# Patient Record
Sex: Male | Born: 1940 | Race: White | Hispanic: No | Marital: Married | Smoking: Former smoker
Health system: Southern US, Community
[De-identification: ages and names within clinical notes are randomized; demographics above are authoritative.]

## PROBLEM LIST (undated history)

## (undated) DIAGNOSIS — M4800 Spinal stenosis, site unspecified: Secondary | ICD-10-CM

## (undated) DIAGNOSIS — M1990 Unspecified osteoarthritis, unspecified site: Secondary | ICD-10-CM

## (undated) DIAGNOSIS — D369 Benign neoplasm, unspecified site: Secondary | ICD-10-CM

## (undated) DIAGNOSIS — B379 Candidiasis, unspecified: Secondary | ICD-10-CM

## (undated) DIAGNOSIS — G062 Extradural and subdural abscess, unspecified: Secondary | ICD-10-CM

## (undated) DIAGNOSIS — M4646 Discitis, unspecified, lumbar region: Secondary | ICD-10-CM

## (undated) DIAGNOSIS — T7840XA Allergy, unspecified, initial encounter: Secondary | ICD-10-CM

## (undated) DIAGNOSIS — H269 Unspecified cataract: Secondary | ICD-10-CM

## (undated) DIAGNOSIS — E785 Hyperlipidemia, unspecified: Secondary | ICD-10-CM

## (undated) DIAGNOSIS — C61 Malignant neoplasm of prostate: Secondary | ICD-10-CM

## (undated) DIAGNOSIS — Z973 Presence of spectacles and contact lenses: Secondary | ICD-10-CM

## (undated) DIAGNOSIS — N400 Enlarged prostate without lower urinary tract symptoms: Secondary | ICD-10-CM

## (undated) HISTORY — PX: TONSILLECTOMY: SUR1361

## (undated) HISTORY — PX: UPPER GASTROINTESTINAL ENDOSCOPY: SHX188

## (undated) HISTORY — DX: Hyperlipidemia, unspecified: E78.5

## (undated) HISTORY — DX: Malignant neoplasm of prostate: C61

## (undated) HISTORY — DX: Allergy, unspecified, initial encounter: T78.40XA

## (undated) HISTORY — PX: PROSTATE SURGERY: SHX751

## (undated) HISTORY — PX: COLONOSCOPY: SHX174

## (undated) HISTORY — DX: Discitis, unspecified, lumbar region: M46.46

## (undated) HISTORY — DX: Benign prostatic hyperplasia without lower urinary tract symptoms: N40.0

## (undated) HISTORY — DX: Unspecified osteoarthritis, unspecified site: M19.90

## (undated) HISTORY — DX: Extradural and subdural abscess, unspecified: G06.2

## (undated) HISTORY — DX: Unspecified cataract: H26.9

## (undated) HISTORY — DX: Benign neoplasm, unspecified site: D36.9

## (undated) HISTORY — PX: JOINT REPLACEMENT: SHX530

## (undated) HISTORY — PX: POLYPECTOMY: SHX149

## (undated) HISTORY — DX: Candidiasis, unspecified: B37.9

## (undated) SURGERY — Surgical Case
Anesthesia: *Unknown

---

## 2002-11-04 HISTORY — PX: COLONOSCOPY W/ POLYPECTOMY: SHX1380

## 2002-11-04 HISTORY — PX: ESOPHAGOGASTRODUODENOSCOPY: SHX1529

## 2003-07-20 ENCOUNTER — Ambulatory Visit: Admission: RE | Admit: 2003-07-20 | Discharge: 2003-09-07 | Payer: Self-pay | Admitting: Radiation Oncology

## 2003-09-12 HISTORY — PX: RETROPUBIC PROSTATECTOMY: SUR1055

## 2003-09-22 ENCOUNTER — Inpatient Hospital Stay (HOSPITAL_COMMUNITY): Admission: RE | Admit: 2003-09-22 | Discharge: 2003-09-24 | Payer: Self-pay | Admitting: Urology

## 2003-09-22 ENCOUNTER — Encounter (INDEPENDENT_AMBULATORY_CARE_PROVIDER_SITE_OTHER): Payer: Self-pay | Admitting: Specialist

## 2007-02-27 ENCOUNTER — Encounter: Admission: RE | Admit: 2007-02-27 | Discharge: 2007-04-19 | Payer: Self-pay | Admitting: Orthopedic Surgery

## 2009-05-25 ENCOUNTER — Ambulatory Visit: Payer: Self-pay | Admitting: Internal Medicine

## 2009-06-08 ENCOUNTER — Ambulatory Visit: Payer: Self-pay | Admitting: Internal Medicine

## 2009-06-08 ENCOUNTER — Encounter: Payer: Self-pay | Admitting: Internal Medicine

## 2009-06-08 HISTORY — PX: COLONOSCOPY W/ POLYPECTOMY: SHX1380

## 2009-06-13 ENCOUNTER — Encounter: Payer: Self-pay | Admitting: Internal Medicine

## 2010-04-11 HISTORY — PX: HIP ARTHROPLASTY: SHX981

## 2010-04-11 HISTORY — PX: CYSTOSCOPY: SUR368

## 2010-04-25 ENCOUNTER — Inpatient Hospital Stay (HOSPITAL_COMMUNITY): Admission: RE | Admit: 2010-04-25 | Discharge: 2010-04-27 | Payer: Self-pay | Admitting: Orthopedic Surgery

## 2010-10-11 LAB — LIPID PANEL
Cholesterol: 115 mg/dL (ref 0–200)
HDL: 35 mg/dL (ref 35–70)

## 2010-11-07 ENCOUNTER — Encounter (INDEPENDENT_AMBULATORY_CARE_PROVIDER_SITE_OTHER): Payer: Self-pay | Admitting: *Deleted

## 2010-11-17 NOTE — Letter (Signed)
Summary: New Patient letter  [facility]  [address]   [place], [place] [address]   Phone: [phone]  Fax: [phone]       11/07/2010 MRN: [id number]  Donald [name] [address] [place], [place]  [address]  Dear Mr. TRULL,  Welcome to the Gastroenterology Division at [facility].    You are scheduled to see Dr.  Leone Payor on 12/15/2010 at 1:45 on the 3rd floor at [facility], [address]  We ask that you try to arrive at our office 15 minutes prior to your appointment time to allow for check-in.  We would like you to complete the enclosed self-administered evaluation form prior to your visit and bring it with you on the day of your appointment.  We will review it with you.  Also, please bring a complete list of all your medications or, if you prefer, bring the medication bottles and we will list them.  Please bring your insurance card so that we may make a copy of it.  If your insurance requires a referral to see a specialist, please bring your referral form from your primary care physician.  Co-payments are due at the time of your visit and may be paid by cash, check or credit card.     Your office visit will consist of a consult with your physician (includes a physical exam), any laboratory testing he/she may order, scheduling of any necessary diagnostic testing (e.g. x-ray, ultrasound, CT-scan), and scheduling of a procedure (e.g. Endoscopy, Colonoscopy) if required.  Please allow enough time on your schedule to allow for any/all of these possibilities.    If you cannot keep your appointment, please call [phone] to cancel or reschedule prior to your appointment date.  This allows [place] the opportunity to schedule an appointment for another patient in need of care.  If you do not cancel or reschedule by 5 p.m. the business day prior to your appointment date, you will be charged a $50.00 late cancellation/no-show fee.    Thank you for choosing  [facility] for your medical needs.  We appreciate the opportunity to care for you.  Please visit [place] at our website  to learn more about our practice.                     Sincerely,                                                             The Gastroenterology Division

## 2010-11-25 LAB — BASIC METABOLIC PANEL
BUN: 12 mg/dL (ref 6–23)
BUN: 18 mg/dL (ref 6–23)
CO2: 29 mEq/L (ref 19–32)
CO2: 24 mEq/L (ref 19–32)
Calcium: 8.4 mg/dL (ref 8.4–10.5)
Calcium: 8.9 mg/dL (ref 8.4–10.5)
Chloride: 106 mEq/L (ref 96–112)
Chloride: 109 mEq/L (ref 96–112)
Creatinine, Ser: 1.11 mg/dL (ref 0.4–1.5)
Creatinine, Ser: 1.06 mg/dL (ref 0.4–1.5)
GFR calc Af Amer: >60 mL/min (ref >60)
GFR calc Af Amer: >60 mL/min (ref >60)
GFR calc non Af Amer: >60 mL/min (ref >60)
GFR calc non Af Amer: >60 mL/min (ref >60)
Glucose, Bld: 138 mg/dL — ABNORMAL HIGH (ref 70–99)
Glucose, Bld: 97 mg/dL (ref 70–99)
Potassium: 4.2 mEq/L (ref 3.5–5.1)
Potassium: 4.2 mEq/L (ref 3.5–5.1)
Sodium: 142 mEq/L (ref 135–145)
Sodium: 142 mEq/L (ref 135–145)

## 2010-11-25 LAB — URINALYSIS, ROUTINE W REFLEX MICROSCOPIC
Bilirubin Urine: NEGATIVE
Glucose, UA: NEGATIVE mg/dL
Hgb urine dipstick: NEGATIVE
Ketones, ur: NEGATIVE mg/dL
Nitrite: NEGATIVE
Protein, ur: NEGATIVE mg/dL
Specific Gravity, Urine: 1.014 (ref 1.005–1.030)
Urobilinogen, UA: 0.2 mg/dL (ref 0.0–1.0)
pH: 6 (ref 5.0–8.0)

## 2010-11-25 LAB — DIFFERENTIAL
Basophils Absolute: 0 10*3/uL (ref 0.0–0.1)
Basophils Relative: 0 % (ref 0–1)
Eosinophils Absolute: 0.3 10*3/uL (ref 0.0–0.7)
Eosinophils Relative: 5 % (ref 0–5)
Lymphocytes Relative: 28 % (ref 12–46)
Lymphs Abs: 2.1 10*3/uL (ref 0.7–4.0)
Monocytes Absolute: 0.5 10*3/uL (ref 0.1–1.0)
Monocytes Relative: 6 % (ref 3–12)
Neutro Abs: 4.6 10*3/uL (ref 1.7–7.7)
Neutrophils Relative %: 61 % (ref 43–77)

## 2010-11-25 LAB — CBC
HCT: 35.7 % — ABNORMAL LOW (ref 39.0–52.0)
HCT: 36.6 % — ABNORMAL LOW (ref 39.0–52.0)
HCT: 44 % (ref 39.0–52.0)
Hemoglobin: 11.8 g/dL — ABNORMAL LOW (ref 13.0–17.0)
Hemoglobin: 12.5 g/dL — ABNORMAL LOW (ref 13.0–17.0)
Hemoglobin: 14.8 g/dL (ref 13.0–17.0)
MCH: 30.1 pg (ref 26.0–34.0)
MCH: 31.1 pg (ref 26.0–34.0)
MCH: 30.4 pg (ref 26.0–34.0)
MCHC: 33.1 g/dL (ref 30.0–36.0)
MCHC: 34.2 g/dL (ref 30.0–36.0)
MCHC: 33.6 g/dL (ref 30.0–36.0)
MCV: 91 fL (ref 78.0–100.0)
MCV: 91.1 fL (ref 78.0–100.0)
MCV: 90.3 fL (ref 78.0–100.0)
Platelets: 155 10*3/uL (ref 150–400)
Platelets: 161 10*3/uL (ref 150–400)
Platelets: 179 10*3/uL (ref 150–400)
RBC: 3.92 MIL/uL — ABNORMAL LOW (ref 4.22–5.81)
RBC: 4.02 MIL/uL — ABNORMAL LOW (ref 4.22–5.81)
RBC: 4.87 MIL/uL (ref 4.22–5.81)
RDW: 13 % (ref 11.5–15.5)
RDW: 13.1 % (ref 11.5–15.5)
RDW: 12.9 % (ref 11.5–15.5)
WBC: 7.6 10*3/uL (ref 4.0–10.5)
WBC: 9.1 10*3/uL (ref 4.0–10.5)
WBC: 7.5 10*3/uL (ref 4.0–10.5)

## 2010-11-25 LAB — PROTIME-INR
INR: 1.06 (ref 0.00–1.49)
INR: 1.25 (ref 0.00–1.49)
INR: 0.99 (ref 0.00–1.49)
Prothrombin Time: 14 seconds (ref 11.6–15.2)
Prothrombin Time: 15.9 seconds — ABNORMAL HIGH (ref 11.6–15.2)
Prothrombin Time: 13.3 seconds (ref 11.6–15.2)

## 2010-11-25 LAB — PSA: PSA: <0.01 ng/mL — ABNORMAL LOW (ref 0.10–4.00)

## 2010-11-25 LAB — ABO/RH: ABO/RH(D): A NEG

## 2010-11-25 LAB — SURGICAL PCR SCREEN
MRSA, PCR: NEGATIVE
Staphylococcus aureus: NEGATIVE

## 2010-11-25 LAB — TYPE AND SCREEN
ABO/RH(D): A NEG
Antibody Screen: NEGATIVE

## 2010-11-25 LAB — APTT: aPTT: 35 seconds (ref 24–37)

## 2010-12-15 ENCOUNTER — Encounter: Payer: Self-pay | Admitting: Internal Medicine

## 2010-12-15 ENCOUNTER — Ambulatory Visit (INDEPENDENT_AMBULATORY_CARE_PROVIDER_SITE_OTHER): Payer: Medicare Other | Admitting: Internal Medicine

## 2010-12-15 DIAGNOSIS — Z791 Long term (current) use of non-steroidal anti-inflammatories (NSAID): Secondary | ICD-10-CM

## 2010-12-15 DIAGNOSIS — R195 Other fecal abnormalities: Secondary | ICD-10-CM

## 2010-12-15 DIAGNOSIS — Z86010 Personal history of colonic polyps: Secondary | ICD-10-CM

## 2010-12-15 MED ORDER — OMEPRAZOLE 20 MG PO CPDR: 20.0000 mg | DELAYED_RELEASE_CAPSULE | Freq: Every day | ORAL | Status: AC

## 2010-12-15 MED ORDER — OMEPRAZOLE 20 MG PO CPDR: 20.0000 mg | DELAYED_RELEASE_CAPSULE | Freq: Every day | ORAL | Status: DC

## 2010-12-15 NOTE — Progress Notes (Signed)
70 year old white man known from prior endoscopic evaluations as outlined above. He was undergoing a digital rectal exam by primary care physician recently and was found to be Hemoccult positive. He denies any rectal bleeding, melena change in bowels or other GI problems. He had a colonoscopy in September 2010 with multiple polyps from moved, 3 of which were adenomatous. He had a previous colonoscopy in 2004. He has a CBC on 10/11/2010 with hemoglobin 14 MCV 89 white count 6.4 platelets 171. Competent metabolic panel as well.  He was recently started on Mobic because of right hip pain. He is going to be another hip arthroplasty but has a summer riverboat cruise planned, that'll be in [place]. He wanted to wait for surgery. The Mobic is helping his hip pain. He is not on PPI therapy   Assessment and plan  He has a heme positive stool on digital rectal exam. Given his previous colonoscopies and lack of other problems we have decided not to work this up further. Since he is in a routine colonoscopy followup and screening program he does not really need routine Hemoccults.  Chronic Mobic therapy, given his age and the chronic NSAID treatment plan for the next several months at least, I have recommended he take omeprazole or other PPI 20 mg daily. He will be at higher risk of peptic ulcer disease and bleeding. If he chooses to take this he may want to have it while he is on the riverboat cruise as there is an increased risk of food born diarrheal illnesses while on a PPI. I've explained the rationale to prevent ulcers and problems on chronic PPI in elderly patients. He was given a prescription and will decide what to do.

## 2010-12-15 NOTE — Patient Instructions (Signed)
Start omeprazole prescription while on Mobic or other anti-inflammatory medicines. We will see you in 2013 for a Colonoscopy recall.

## 2010-12-16 ENCOUNTER — Encounter: Payer: Self-pay | Admitting: Internal Medicine

## 2010-12-16 DIAGNOSIS — Z86010 Personal history of colonic polyps: Secondary | ICD-10-CM | POA: Insufficient documentation

## 2011-01-09 ENCOUNTER — Ambulatory Visit (HOSPITAL_COMMUNITY)
Admission: RE | Admit: 2011-01-09 | Discharge: 2011-01-09 | Disposition: A | Discharge status: Home / Self Care | Payer: Medicare Other | Source: Ambulatory Visit | Attending: Orthopedic Surgery | Admitting: Orthopedic Surgery

## 2011-01-09 ENCOUNTER — Other Ambulatory Visit (HOSPITAL_COMMUNITY): Payer: Self-pay | Admitting: Orthopedic Surgery

## 2011-01-09 ENCOUNTER — Encounter (HOSPITAL_COMMUNITY)
Admission: RE | Admit: 2011-01-09 | Discharge: 2011-01-09 | Disposition: A | Discharge status: Home / Self Care | Payer: Medicare Other | Source: Ambulatory Visit | Attending: Orthopedic Surgery | Admitting: Orthopedic Surgery

## 2011-01-09 DIAGNOSIS — Z01811 Encounter for preprocedural respiratory examination: Secondary | ICD-10-CM

## 2011-01-09 DIAGNOSIS — Z01818 Encounter for other preprocedural examination: Secondary | ICD-10-CM | POA: Insufficient documentation

## 2011-01-09 DIAGNOSIS — Z01812 Encounter for preprocedural laboratory examination: Secondary | ICD-10-CM | POA: Insufficient documentation

## 2011-01-09 LAB — BASIC METABOLIC PANEL
Calcium: 9.2 mg/dL (ref 8.4–10.5)
GFR calc non Af Amer: >60 mL/min (ref >60)
Glucose, Bld: 88 mg/dL (ref 70–99)
Sodium: 141 mEq/L (ref 135–145)

## 2011-01-09 LAB — URINALYSIS, ROUTINE W REFLEX MICROSCOPIC
Nitrite: NEGATIVE
Specific Gravity, Urine: 1.021 (ref 1.005–1.030)
Urobilinogen, UA: 0.2 mg/dL (ref 0.0–1.0)
pH: 6 (ref 5.0–8.0)

## 2011-01-09 LAB — DIFFERENTIAL
Basophils Absolute: 0 10*3/uL (ref 0.0–0.1)
Basophils Relative: 0 % (ref 0–1)
Lymphocytes Relative: 26 % (ref 12–46)
Neutro Abs: 4.3 10*3/uL (ref 1.7–7.7)
Neutrophils Relative %: 63 % (ref 43–77)

## 2011-01-09 LAB — SURGICAL PCR SCREEN: Staphylococcus aureus: NEGATIVE

## 2011-01-09 LAB — CBC
HCT: 43.7 % (ref 39.0–52.0)
Hemoglobin: 14.7 g/dL (ref 13.0–17.0)
MCHC: 33.6 g/dL (ref 30.0–36.0)
RBC: 4.85 MIL/uL (ref 4.22–5.81)
WBC: 6.8 10*3/uL (ref 4.0–10.5)

## 2011-01-09 LAB — PROTIME-INR
INR: 1.02 (ref 0.00–1.49)
Prothrombin Time: 13.6 seconds (ref 11.6–15.2)

## 2011-01-09 LAB — APTT: aPTT: 30 seconds (ref 24–37)

## 2011-01-16 ENCOUNTER — Inpatient Hospital Stay (HOSPITAL_COMMUNITY): Payer: Medicare Other

## 2011-01-16 ENCOUNTER — Inpatient Hospital Stay (HOSPITAL_COMMUNITY)
Admission: RE | Admit: 2011-01-16 | Discharge: 2011-01-17 | DRG: 470 | Disposition: A | Discharge status: Home Health | Payer: Medicare Other | Source: Ambulatory Visit | Attending: Orthopedic Surgery | Admitting: Orthopedic Surgery

## 2011-01-16 DIAGNOSIS — M169 Osteoarthritis of hip, unspecified: Principal | ICD-10-CM | POA: Diagnosis present

## 2011-01-16 DIAGNOSIS — M1610 Unilateral primary osteoarthritis, unspecified hip: Principal | ICD-10-CM | POA: Diagnosis present

## 2011-01-16 LAB — TYPE AND SCREEN: ABO/RH(D): A NEG

## 2011-01-16 NOTE — Op Note (Addendum)
NAME:  Donald [name], Donald [name]             ACCOUNT NO.:  [id number]  MEDICAL RECORD NO.:  [id number]           PATIENT TYPE:  O  LOCATION:  XRAY                         FACILITY:  [facility]  PHYSICIAN:  Feliberto Gottron. Turner Daniels, M.D.   DATE OF BIRTH:  1941/02/06  DATE OF PROCEDURE:  01/16/2011 DATE OF DISCHARGE:  01/09/2011                              OPERATIVE REPORT   PREOPERATIVE DIAGNOSIS:  End-stage arthritis, right hip.  POSTOPERATIVE DIAGNOSIS:  End-stage arthritis, right hip.  PROCEDURE:  Right total hip arthroplasty using DePuy, 58-mm pinnacle cup, central occluder, 40-mm metal liner, 18 x 13 x 42 x 160 S-ROM stem, 69F large cone, +0 40-mm metal ball.  SURGEON:  Feliberto Gottron. Turner Daniels, MD  FIRST ASSISTANT:  Shirl Harris, PA-C  ANESTHETIC:  Endotracheal.  ESTIMATED BLOOD LOSS:  400 mL.  FLUID REPLACEMENT:  1500 mL crystalloid.  DRAINS PLACED:  None.  TOURNIQUET TIME:  None.  INDICATIONS FOR PROCEDURE:  A 70 year old gentleman who underwent primary left total hip by me last year and has done very well, desires elective right total hip arthroplasty for bone-on-bone arthritic changes.  Risks and benefits of surgery are known and he is well prepared for surgical intervention.  He has severe disabling pain similar to what he had on the left side prior to that replacement.  DESCRIPTION OF PROCEDURE:  The patient identified by armband and in the holding area received preoperative IV antibiotics, was then taken to operating room #5.  Appropriate site monitors attached and endotracheal anesthesia induced with the patient in supine position, rolled into the left lateral decubitus position, fixed there with a Stulberg Mark II pelvic clamp.  Right lower extremity prepped and draped in usual sterile fashion from the ankle to the hemipelvis.  Time-out procedure performed. We then infiltrated the skin along the lateral hip and thigh with 20 mL of 0.5% Marcaine and epinephrine solution and 15 cm  incision was made centered over the greater trochanter allowing a posterolateral approach to the hip.  Small bleeders in skin and subcutaneous tissue identified and cauterized.  The IT band cut line with skin incision exposing the greater trochanter.  Cobra retractors were placed between the gluteus minimus and superior hip joint capsule and the quadratus femoris in the inferior hip joint capsule isolating the short external rotators and piriformis which were then cut off their insertion on the intertrochanteric crest and tagged with a #2 Ethibond suture.  The capsule was then developed into an acetabular based flap going from posterior-superior on the acetabulum out over the femoral neck and exiting posterior inferior back to the acetabulum.  This flap was likewise tagged to #2 Ethibond sutures.  At this point the hip was flexed and internally rotated dislocating the femoral head.  Standard neck cut performed one fingerbreadth above the lesser trochanter and the femoral head was extracted without difficulty.  At this point, we went ahead and translated with proximal femur anteriorly levering off the anterior column with a Hohmann retractor.  A spike Cobra was placed in the cotyloid notch and a posterior inferior wing retractor.  This gave [place] great exposure to the labrum which was  then removed without difficulty.  We then sequentially reamed up to a 57-mm basket reamer obtaining good coverage in all quadrants just getting down to the bleeding bone and touched the rim with a 58-mm reamer.  A 58-mm pinnacle cup with central hole only was then hammered into place and 40 degrees of abduction, 20 degrees of anteversion.  Central occluder placed and 40 mm metal liner hammered into the shell.  The hip was flexed and internally rotated exposing the proximal femur which was entered with the box cutting chisel, the initiating reamer, and axial reamers 8, 10, 11, 12, and half millimeter increments,  we went up to the 13.5-mm reamer and actually obtained good chatter at 12-mm but he had incredibly thick bone.  We reamed partway down the 14-mm reamer followed by conical reaming up to a 14F cone to the appropriate depth for a 42 base neck. We then milled the calcar to 14F large calcar and placed a trial Cone. This was followed by a trial stem in the same version as the cone about 10-15 degrees with a +0 40-mm ball.  The hip was reduced.  Excellent stability was noted to full extension and 40 of external rotation with no anterior instability and flexion of 90 with internal rotation of almost 80 degrees again with no instability noted.  At this point the trial component was removed.  The wound was thoroughly irrigated out with normal saline solution.  An 14F large cone was then hammered into place followed by a 18 x 13 x 160 x 42 S-ROM stem in about 5 degrees of anteversion in relation to the cone.  A +0 40-mm ball was hammered on the stem.  The hip was reduced.  Stability checked and found to be excellent.  We once again irrigated out with normal saline solution. The capsular flap and short external rotators were repaired back to the intertrochanteric crest through drill holes.  The IT band was closed with running #1 Vicryl suture.  The subcutaneous tissue with 0-0 and 2-0 undyed Vicryl suture and the skin with running interlocking 3-0 nylon suture.  Dressing of Xeroform and Mepilex was then applied.  The patient was unclamped, rolled supine, awakened, and extubated, and taken to the recovery room without difficulty.     Feliberto Gottron. Turner Daniels, M.D.     Ovid Curd  D:  01/16/2011  T:  01/16/2011  Job:  [id number]  Electronically Signed by Gean Birchwood M.D. on 02/02/2011 08:23:31 AM

## 2011-01-17 LAB — CBC
Hemoglobin: 10.5 g/dL — ABNORMAL LOW (ref 13.0–17.0)
MCH: 29.8 pg (ref 26.0–34.0)
MCHC: 33 g/dL (ref 30.0–36.0)
MCV: 90.3 fL (ref 78.0–100.0)
Platelets: 142 10*3/uL — ABNORMAL LOW (ref 150–400)
RBC: 3.52 MIL/uL — ABNORMAL LOW (ref 4.22–5.81)

## 2011-01-17 LAB — BASIC METABOLIC PANEL
BUN: 11 mg/dL (ref 6–23)
CO2: 30 mEq/L (ref 19–32)
Glucose, Bld: 111 mg/dL — ABNORMAL HIGH (ref 70–99)
Potassium: 3.6 mEq/L (ref 3.5–5.1)
Sodium: 139 mEq/L (ref 135–145)

## 2011-01-27 NOTE — Discharge Summary (Signed)
NAME:  Donald [name], Donald [name]                       ACCOUNT NO.:  [id number]   MEDICAL RECORD NO.:  [id number]                   PATIENT TYPE:  INP   LOCATION:  0383                                 FACILITY:  [facility]   PHYSICIAN:  Sigmund I. Patsi Sears, M.D.         DATE OF BIRTH:  30-Mar-1941   DATE OF ADMISSION:  09/22/2003  DATE OF DISCHARGE:  09/24/2003                                 DISCHARGE SUMMARY   DISCHARGE DIAGNOSES:  1. Prostate cancer.  2. Benign prostatic hypertrophy.   HISTORY OF PRESENT ILLNESS:  This is a 70 year old white male who was last  seen in 1992 with a history of BPH.  Since that time, his father has died  secondary to adenocarcinoma of the prostate almost one year ago.  The  patient's PSA had risen at his primary care office and he was referred back  to our office for further evaluation.  Prostate biopsies were obtained and  the patient was found to have a Gleason 3+4 (7) adenocarcinoma in the right  lateral biopsies and right central biopsies.  Dr. Patsi Sears discussed  treatment plans with the patient and he opted to have radical prostatectomy.  He was brought to the [facility] and procedure was done without  difficulty.   PAST MEDICAL HISTORY:  BPH.   HOSPITAL COURSE:  The patient was taken to the OR where radical  prostatectomy was performed (see operative note).  On postoperative day #1,  the patient was awake and alert, complaining of very little pain, and had no  problems with nausea or vomiting.  The Foley catheter was draining  adequately.  The patient ambulated several times during the evening on  postoperative day #1 and was very anxious to go home.  On postoperative day  #2, Dr. Patsi Sears reviewed postoperative instructions with the patient and  his wife and the patient felt that he was ready to go home.  Dr. Patsi Sears  assessed the patient and he was doing very well.  Dressings were changed and  instructions were given to the patient.  He was  given instructions on bowel  care at home.  He had not had a bowel movement before leaving the hospital,  but was passing significant flatus.   DISCHARGE MEDICATIONS:  1. Percocet.  2. Cipro.  3. He was to resume home medications.  4. He was to get Dulcolax to have at home to keep his bowels moving.   FOLLOWUP:  The patient is to follow up in the office with [facility]  next week to have staples removed and then will follow up in two weeks to  have Foley catheter removed.   CONDITION ON DISCHARGE:  Stable.     Terri Piedra, N.P.                         Sigmund I. Patsi Sears, M.D.    HB/MEDQ  D:  09/25/2003  T:  09/25/2003  Job:  [id number]

## 2011-01-27 NOTE — Op Note (Signed)
NAME:  Donald [name], Donald [name]                       ACCOUNT NO.:  [id number]   MEDICAL RECORD NO.:  [id number]                   PATIENT TYPE:  INP   LOCATION:  0007                                 FACILITY:  [facility]   PHYSICIAN:  Sigmund I. Patsi Sears, M.D.         DATE OF BIRTH:  October 30, 1940   DATE OF PROCEDURE:  09/22/2003  DATE OF DISCHARGE:                                 OPERATIVE REPORT   PREOPERATIVE DIAGNOSIS:  Adenocarcinoma of the prostate.   POSTOPERATIVE DIAGNOSIS:  Adenocarcinoma of the prostate.   OPERATION/PROCEDURE:  Radical retropubic prostatectomy.   SURGEON:  Sigmund I. Patsi Sears, M.D.   ASSISTANT:  Lucrezia Starch. Earlene Plater, M.D.   ANESTHESIA:  General endotracheal anesthesia.   ESTIMATED BLOOD LOSS:  500 mL.   PREPARATION:  After appropriate preanesthesia, the patient was brought to  the operating room and placed on the operating table in the dorsal supine  position where general endotracheal anesthesia was introduced.  He was then  replaced in the dorsal lithotomy position where the pubis was prepped with  Betadine solution and draped in the usual fashion.   REVIEW OF HISTORY:  This 70 year old male has a history of Gleason's 3 + 4  adenocarcinoma of the prostate in the right lateral biopsies, with a symptom  score sheet of 15/7, decreased to 10/7 while on doxazosin 4 mg a day  therapy.  CT scan and bone scan were negative.  He had a second opinion with  radiation therapy (Dr. Dan Humphreys) who advised radical prostatectomy.  Note  that the patient has a family history of prostate cancer as well.   DESCRIPTION OF PROCEDURE:  With the patient in the supine position, a roll  was placed under his hips, the table was placed in the flexed position.  Following this, a 16 cm midline suprapubic incision was made with  subcutaneous tissue dissected with the electrosurgical unit.  Retractor was  placed and the pelvic gutter was dissected bilaterally.  The obturator lymph  nodes  were dissected from the pelvic sidewall and from the external iliac  vein, distal-ward to the femoral canal, and inferiorly to the identified  obturator nerve.  The node package was removed bilaterally and sent  separately for evaluation.   The retropubic fascia was then dissected until the puboprostatic ligaments  were identified, and then the ligaments were incised.  A backbleeding suture  was placed in the bladder neck, and using a McDougal clamp, the tissue  superior to the urethra, the dorsal vein complex was doubly ligated and  incised.  Following this, dissection was accomplished around the urethra  with care taken to avoid injury to the neurovascular tissue of the urethra.  The urethra was incised dorsally and the catheter pulled through and used as  a traction device.  Under the posterior urethra, a drain was placed and the  posterior urethra was then incised.  Denonvilliers fascia was dissected and  the vascular supply of the prostate  was dissected laterally bilaterally.  The rectourethralis muscle was dissected inferiorly.  The ampulla of vas was  then identified and clipped and cut.   The bladder neck was then dissected and the patient was noted to have a  moderate sized median lobe and incision was made behind the median lobe of  the bladder neck.  Indigo carmine was given and ureteral orifices were  identified and blue dye was noted bilaterally.  Following this, the bladder  neck was everted with interrupted 4-0 Vicryl sutures.  3-0 Vicryl sutures  were used to tighten and elongate the bladder neck.  Four separate 2-0  Monocryl sutures were then placed at the 7 o'clock and 5 o'clock positions,  at the 10 o'clock and 2 o'clock positions and at the 12 o'clock position.  The bladder was irrigated.  The wound was irrigated and a size 20 Silastic  Foley with 22 cc in the balloon was placed in the bladder transurethrally.  With all sutures in place, traction was placed on the  balloon and the  sutures were then ligated.  Irrigation revealed a watertight anastomosis and  a 10 Blake drain was placed in the left pelvis and sutured in place with 3-0  nylon suture.   The wound was then closed by reapproximating the rectus muscle with loosely  ligated 3-0 chromic suture and the fascia was closed with #1 PDS running  suture.  Skin was closed with skin staples.  Plain Marcaine 0.25% was  injected in the wound and the patient was given IV Toradol.  He was awakened  and taken to the recovery room in good condition.                                               Sigmund I. Patsi Sears, M.D.    SIT/MEDQ  D:  09/22/2003  T:  09/22/2003  Job:  [id number]   cc:   Windy Fast L. Ovidio Hanger, M.D.  [address]  [place]  [place] [address]  Fax: [phone]

## 2011-01-27 NOTE — H&P (Signed)
NAME:  Donald [name], Donald [name]                       ACCOUNT NO.:  [id number]   MEDICAL RECORD NO.:  [id number]                   PATIENT TYPE:  INP   LOCATION:  0383                                 FACILITY:  [facility]   PHYSICIAN:  Sigmund I. Patsi Sears, M.D.         DATE OF BIRTH:  03/13/1941   DATE OF ADMISSION:  09/22/2003  DATE OF DISCHARGE:  09/24/2003                                HISTORY & PHYSICAL   HISTORY OF PRESENT ILLNESS:  This is a 70 year old white male who was last  seen in 1992, with a history of BPH.  Since that time, the patient's father  has died secondary to adenocarcinoma of the prostate, almost one year ago.  The patient has had PSA's with his primary care physician over the last  several years.  In 2001, his PSA was 3.9, 4.8 in January of 2004 and 5.9 in  September of 2004.  He was then referred back to our office given his  elevated PSA and positive family history of adenocarcinoma of the prostate.  Dr. Patsi Sears feels strongly that the patient needs a prostate ultrasound  and biopsy. Biopsy was performed and revealed a Gleason 3+4 (7)  adenocarcinoma in the right lateral biopsies and the right central biopsies.  Options were discussed with the patient and wife and it was decided that  patient would have a radical prostatectomy.   PAST MEDICAL HISTORY:  Benign prostatic hypertrophy.   PAST SURGICAL HISTORY:  None reported.   SOCIAL HISTORY:  The patient is married, states he quit smoking in 1970.  He  states he drinks approximately 1 pint of alcohol per week.   MEDICATIONS:  1. Doxazosin 4 g.  2. Flomax 0.4 mg q.d.   ALLERGIES:  IV CONTRAST and RED DYES.   REVIEW OF SYMPTOMS:  Noncontributory other than previously stated in the  HPI.   PHYSICAL EXAMINATION:  GENERAL:  A 70 year old, well-developed, well-  nourished, white male in no acute distress.  HEENT:  Normocephalic, no lymphadenopathy noted.  CARDIAC:  Regular rate and rhythm, no murmurs, rubs or  gallops noted.  RESPIRATORY:  Clear to auscultation bilaterally.  GI:  Rotund, soft, positive bowel sounds.  GU:  3-4+ lobular gland.  There is a firmness on the left side which is  different from the right.  NEUROLOGIC:  Grossly intact.  EXTREMITIES:  2+ pulses bilaterally, no edema noted.   LABORATORIES/X-RAYS/IMAGING:  AUA symptom score sheet is 15/7.  Improved to  10/7 with increased medication.  CT and bone scan are negative for  metastatic disease.  A prostate ultrasound shows a 57 mL gland.   ASSESSMENT:  Prostate cancer.   PLAN:  We will take the patient to [facility] for radical  prostatectomy with Sigmund I. Patsi Sears, M.D.     [place], [place].P.  Sigmund I. Patsi Sears, M.D.    HB/MEDQ  D:  09/24/2003  T:  09/24/2003  Job:  [id number]

## 2011-02-01 NOTE — Discharge Summary (Addendum)
  NAME:  Donald [name], BALABAN NO.:  [id number]  MEDICAL RECORD NO.:  [id number]           PATIENT TYPE:  LOCATION:                                 FACILITY:  PHYSICIAN:  Feliberto Gottron. Turner Daniels, M.D.   DATE OF BIRTH:  12-26-1940  DATE OF ADMISSION:  01/16/2011 DATE OF DISCHARGE:  01/17/2011                              DISCHARGE SUMMARY   CHIEF COMPLAINT:  Right hip pain.  HISTORY OF PRESENT ILLNESS:  This is a 70 year old gentleman who complains of severe unremitting pain in his right hip despite extensive conservative treatment.  He has a history of successful hip replacement on the contralateral side and now desires a surgical intervention on the right.  All risks and benefits of surgery were discussed with the patient.  PAST MEDICAL HISTORY:  Significant for prostate cancer and high cholesterol.  PAST SURGICAL HISTORY:  Significant for left total hip arthroplasty and radical prostatectomy.  SOCIAL HISTORY:  Denies use of tobacco and drinks occasional alcohol.  FAMILY HISTORY:  Positive for diabetes and coronary artery disease.  ALLERGIES:  He is allergic to RED TRACE DYE.  OBJECTIVE:  Gross examination of the right hip demonstrates significant pain with internal rotation.  Foot tap is negative.  He is neurovascularly intact.  X-rays of the right hip demonstrate moderate to severe arthritis.  PREOP LABS:  White blood cells 6.4, red blood cells 3.52, hemoglobin 10.5, hematocrit 31.8, platelets 142,000.  PT 15.8, INR 1.24.  Sodium 139, potassium 3.6, chloride 105, glucose 111, BUN 11, creatinine 1.0. Urinalysis was within normal limits.  HOSPITAL COURSE:  [name] was admitted to [facility] on Jan 16, 2011 when he underwent right total hip arthroplasty.  The procedure was performed by Dr. Gean Birchwood and the patient tolerated it well. Perioperative Foley catheter was placed and he was transferred to the floor on Lovenox and Coumadin for DVT prophylaxis.  On  the first postoperative day, he was awake and alert and reporting minimal pain in his hip.  He passed physical therapy.  His Foley catheter was discontinued and he was discharged home.  DISPOSITION:  The patient was discharged home on Jan 17, 2011.  He was weightbearing as tolerated and he will return to the clinic in 2 weeks for x-rays and suture removal.  He would be on Coumadin for 2 weeks with a target INR of 1.5-2.0.  Home health care would manage his wound, Coumadin, and physical therapy.  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     Shirl Harris, PA   ______________________________ Feliberto Gottron. Turner Daniels, M.D.    JW/MEDQ  D:  01/25/2011  T:  01/25/2011  Job:  [id number]  Electronically Signed by Shirl Harris PA on 02/07/2011 08:36:29 AM Electronically Signed by Gean Birchwood M.D. on 02/12/2011 04:11:33 PM

## 2011-08-17 ENCOUNTER — Ambulatory Visit (INDEPENDENT_AMBULATORY_CARE_PROVIDER_SITE_OTHER): Payer: Medicare Other

## 2011-08-17 DIAGNOSIS — H60339 Swimmer's ear, unspecified ear: Secondary | ICD-10-CM

## 2011-10-19 ENCOUNTER — Encounter: Payer: Self-pay | Admitting: Family Medicine

## 2011-10-19 LAB — IFOBT (OCCULT BLOOD): IFOBT: POSITIVE

## 2011-10-20 ENCOUNTER — Ambulatory Visit (INDEPENDENT_AMBULATORY_CARE_PROVIDER_SITE_OTHER): Payer: Medicare Other | Admitting: Family Medicine

## 2011-10-20 DIAGNOSIS — E785 Hyperlipidemia, unspecified: Secondary | ICD-10-CM | POA: Insufficient documentation

## 2011-10-20 DIAGNOSIS — C61 Malignant neoplasm of prostate: Secondary | ICD-10-CM | POA: Insufficient documentation

## 2011-10-20 DIAGNOSIS — Z23 Encounter for immunization: Secondary | ICD-10-CM

## 2011-10-20 DIAGNOSIS — Z0000 Encounter for general adult medical examination without abnormal findings: Secondary | ICD-10-CM

## 2011-10-20 DIAGNOSIS — G4700 Insomnia, unspecified: Secondary | ICD-10-CM

## 2011-10-20 LAB — COMPREHENSIVE METABOLIC PANEL
Albumin: 4.2 g/dL (ref 3.5–5.2)
BUN: 20 mg/dL (ref 6–23)
CO2: 28 mEq/L (ref 19–32)
Calcium: 8.9 mg/dL (ref 8.4–10.5)
Chloride: 109 mEq/L (ref 96–112)
Glucose, Bld: 83 mg/dL (ref 70–99)
Potassium: 4.2 mEq/L (ref 3.5–5.3)

## 2011-10-20 LAB — CBC
Hemoglobin: 13.9 g/dL (ref 13.0–17.0)
MCHC: 32.5 g/dL (ref 30.0–36.0)
RBC: 4.78 MIL/uL (ref 4.22–5.81)
WBC: 6.4 10*3/uL (ref 4.0–10.5)

## 2011-10-20 LAB — LIPID PANEL: Cholesterol: 119 mg/dL (ref 0–200)

## 2011-10-20 MED ORDER — ZOLPIDEM TARTRATE 10 MG PO TABS: 5.0000 mg | ORAL_TABLET | Freq: Every evening | ORAL | Status: DC | PRN

## 2011-10-20 MED ORDER — SIMVASTATIN 20 MG PO TABS: 20.0000 mg | ORAL_TABLET | Freq: Every day | ORAL | Status: DC

## 2011-10-20 NOTE — Patient Instructions (Signed)
Continue same medicines for now.  Will check labs today and results should be available in next 7 - 10 days.  Recheck liver tests in 6 months.

## 2011-10-20 NOTE — Progress Notes (Signed)
  Subjective:    Patient ID: Donald [name], male    DOB: [birth year], 71 y.o.   MRN: [id number]  HPI  Donald [name] is a 71 y.o. male  Here for CPE  Left leg problems for years- problem with strength/balance, had EMG's and seen by Dr. Regino [name] - suspected lumbar spine source.  Does Tai Chi.  No recent worsening.  Working on exercise.  Not taking mobic, minimal to change to quality of life.  Has had hip surgeries. Ortho - Dr. Turner [name]   Chronic insomnia - able to treat with 5mg  ambien. (has decreased from 10mg  in past).  Nap in afternoon, but no sedation otherwise.  Taking simvastatin without difficulty. No new arthralgias.  Hx prostatectomy, unable to achieve erections since- followed by Urologist. No new sx's Review of Systems   13 point ROS on patient health survey reviewed - see scanned copy. Objective:   Physical Exam  Constitutional: He is oriented to person, place, and time. He appears well-developed and well-nourished.  HENT:  Head: Normocephalic and atraumatic.  Eyes: EOM are normal. Pupils are equal, round, and reactive to light.  Neck: Normal range of motion. Neck supple.  Cardiovascular: Normal rate, regular rhythm, normal heart sounds and intact distal pulses.   Pulmonary/Chest: Effort normal and breath sounds normal. He has no rales.  Abdominal: Soft. Bowel sounds are normal. There is no tenderness.  Musculoskeletal: Normal range of motion. He exhibits no edema.  Neurological: He is alert and oriented to person, place, and time.  Skin: Skin is warm and dry.  Psychiatric: He has a normal mood and affect. His behavior is normal.        Assessment & Plan:   1. Hyperlipidemia  Lipid panel, Comprehensive metabolic panel, CBC  2. Insomnia    3. Prostate cancer  PSA  4. Need for pneumococcal vaccination     Labs pending -meds refilled at current doses.  Cont to split Ambien to 5 mg - cautioned again on side effects, but has tolerated for years.  Hx of prostate cancer,  with no evidence of recurrence  - las PSA<0.01.  Recheck PSA.  Pneumovax given, as not apparent he has had one prior.  PAtient health survey reviewed today, but plans on Medicare Wellness visit at later date.

## 2011-10-21 LAB — PSA: PSA: <0.01 ng/mL (ref <=4.00)

## 2012-05-28 ENCOUNTER — Encounter: Payer: Self-pay | Admitting: Internal Medicine

## 2012-06-04 ENCOUNTER — Encounter: Payer: Self-pay | Admitting: Internal Medicine

## 2012-06-10 ENCOUNTER — Ambulatory Visit (AMBULATORY_SURGERY_CENTER): Payer: Medicare Other | Admitting: *Deleted

## 2012-06-10 VITALS — Ht 72.0 in | Wt 231.0 lb

## 2012-06-10 DIAGNOSIS — Z1211 Encounter for screening for malignant neoplasm of colon: Secondary | ICD-10-CM

## 2012-06-10 MED ORDER — NA SULFATE-K SULFATE-MG SULF 17.5-3.13-1.6 GM/177ML PO SOLN: ORAL | Status: DC

## 2012-06-11 ENCOUNTER — Encounter: Payer: Self-pay | Admitting: Internal Medicine

## 2012-06-18 ENCOUNTER — Other Ambulatory Visit: Payer: Self-pay | Admitting: Family Medicine

## 2012-06-19 NOTE — Telephone Encounter (Signed)
At tl desk 

## 2012-06-20 ENCOUNTER — Other Ambulatory Visit: Payer: Self-pay | Admitting: *Deleted

## 2012-06-21 ENCOUNTER — Encounter: Payer: Self-pay | Admitting: Internal Medicine

## 2012-06-21 ENCOUNTER — Ambulatory Visit (AMBULATORY_SURGERY_CENTER): Payer: Medicare Other | Admitting: Internal Medicine

## 2012-06-21 VITALS — BP 109/71 | HR 52 | Temp 97.3°F | Resp 20 | Ht 72.0 in | Wt 231.0 lb

## 2012-06-21 DIAGNOSIS — Z1211 Encounter for screening for malignant neoplasm of colon: Secondary | ICD-10-CM

## 2012-06-21 DIAGNOSIS — D126 Benign neoplasm of colon, unspecified: Secondary | ICD-10-CM

## 2012-06-21 DIAGNOSIS — Z86010 Personal history of colonic polyps: Secondary | ICD-10-CM

## 2012-06-21 MED ORDER — SODIUM CHLORIDE 0.9 % IV SOLN: 500.0000 mL | INTRAVENOUS | Status: DC

## 2012-06-21 NOTE — Op Note (Signed)
[facility] [address] [place], [address]   COLONOSCOPY PROCEDURE REPORT  PATIENT: Donald [name], Donald [name].  MR#: [id number] BIRTHDATE: 1941/02/08 , 71  yrs. old GENDER: Male ENDOSCOPIST: Iva Boop, MD, [facility]  PROCEDURE DATE:  06/21/2012 PROCEDURE:   Colonoscopy with snare polypectomy ASA CLASS:   Class I INDICATIONS:screening and surveillance,personal history of colonic polyps. MEDICATIONS: propofol (Diprivan) 200mg  IV, MAC sedation, administered by CRNA, and These medications were titrated to patient response per physician's verbal order  DESCRIPTION OF PROCEDURE:   After the risks benefits and alternatives of the procedure were thoroughly explained, informed consent was obtained.  A digital rectal exam revealed no abnormalities of the rectum.   The LB [id number]  endoscope was introduced through the anus and advanced to the cecum, which was identified by both the appendix and ileocecal valve. No adverse events experienced.   The quality of the prep was Suprep excellent The instrument was then slowly withdrawn as the colon was fully examined.      COLON FINDINGS: Nine sessile polyps measuring 2-10 mm in size were found at the cecum, in the transverse colon, descending colon, and sigmoid colon.  A polypectomy was performed with a cold snare.  1 polyp not retrieved.  The resection was complete and the polyp tissue was partially retrieved.   Small internal hemorrhoids were found.   The colon mucosa was otherwise normal.  Retroflexed views revealed internal hemorrhoids. The time to cecum=3 minutes 10 seconds.  Withdrawal time=19 minutes 52 seconds.  The scope was withdrawn and the procedure completed. COMPLICATIONS: There were no complications.  ENDOSCOPIC IMPRESSION: 1.   Nine sessile polyps measuring 2-10 mm in size were found at the cecum, in the transverse colon, descending colon, and sigmoid colon; polypectomy was performed with a cold  snare 2.   Small internal hemorrhoids 3.   The colon mucosa was otherwise normal with excellent prep 4.   Prior adenomatous polyps 2004 and 2010  RECOMMENDATIONS: Timing of repeat colonoscopy will be determined by pathology findings. Avoid routine hemoccults.   eSigned:  Iva Boop, MD, [facility] 06/21/2012 3:32 PM   cc: Chilton Greathouse, MD and The Patient

## 2012-06-21 NOTE — Patient Instructions (Addendum)
Nine polyps were removed today. They all look benign. I anticipate recommending another colonoscopy in 2-3 years.  Thank you for choosing me and [facility].  Iva Boop, MD, FACG  YOU HAD AN ENDOSCOPIC PROCEDURE TODAY AT THE [facility]: Refer to the procedure report that was given to you for any specific questions about what was found during the examination.  If the procedure report does not answer your questions, please call your gastroenterologist to clarify.  If you requested that your care partner not be given the details of your procedure findings, then the procedure report has been included in a sealed envelope for you to review at your convenience later.  YOU SHOULD EXPECT: Some feelings of bloating in the abdomen. Passage of more gas than usual.  Walking can help get rid of the air that was put into your GI tract during the procedure and reduce the bloating. If you had a lower endoscopy (such as a colonoscopy or flexible sigmoidoscopy) you may notice spotting of blood in your stool or on the toilet paper. If you underwent a bowel prep for your procedure, then you may not have a normal bowel movement for a few days.  DIET: Your first meal following the procedure should be a light meal and then it is ok to progress to your normal diet.  A half-sandwich or bowl of soup is an example of a good first meal.  Heavy or fried foods are harder to digest and may make you feel nauseous or bloated.  Likewise meals heavy in dairy and vegetables can cause extra gas to form and this can also increase the bloating.  Drink plenty of fluids but you should avoid alcoholic beverages for 24 hours.  ACTIVITY: Your care partner should take you home directly after the procedure.  You should plan to take it easy, moving slowly for the rest of the day.  You can resume normal activity the day after the procedure however you should NOT DRIVE or use heavy machinery for 24 hours (because of  the sedation medicines used during the test).    SYMPTOMS TO REPORT IMMEDIATELY: A gastroenterologist can be reached at any hour.  During normal business hours, 8:30 AM to 5:00 PM Monday through Friday, call [phone].  After hours and on weekends, please call the GI answering service at [phone] who will take a message and have the physician on call contact you.   Following lower endoscopy (colonoscopy or flexible sigmoidoscopy):  Excessive amounts of blood in the stool  Significant tenderness or worsening of abdominal pains  Swelling of the abdomen that is new, acute  Fever of 100F or higher  Following upper endoscopy (EGD)  Vomiting of blood or coffee ground material  New chest pain or pain under the shoulder blades  Painful or persistently difficult swallowing  New shortness of breath  Fever of 100F or higher  Black, tarry-looking stools  FOLLOW UP: If any biopsies were taken you will be contacted by phone or by letter within the next 1-3 weeks.  Call your gastroenterologist if you have not heard about the biopsies in 3 weeks.  Our staff will call the home number listed on your records the next business day following your procedure to check on you and address any questions or concerns that you may have at that time regarding the information given to you following your procedure. This is a courtesy call and so if there is no answer at the home number  and we have not heard from you through the emergency physician on call, we will assume that you have returned to your regular daily activities without incident.  SIGNATURES/CONFIDENTIALITY: You and/or your care partner have signed paperwork which will be entered into your electronic medical record.  These signatures attest to the fact that that the information above on your After Visit Summary has been reviewed and is understood.  Full responsibility of the confidentiality of this discharge information lies with you and/or your  care-partner.   Handout on polyps and hemorrhoids

## 2012-06-21 NOTE — Progress Notes (Signed)
The pt tolerated the colonoscopy very well. Maw   

## 2012-06-21 NOTE — Progress Notes (Signed)
Patient did not experience any of the following events: a burn prior to discharge; a fall within the facility; wrong site/side/patient/procedure/implant event; or a hospital transfer or hospital admission upon discharge from the facility. (G8907) Patient did not have preoperative order for IV antibiotic SSI prophylaxis. (G8918)  

## 2012-06-24 ENCOUNTER — Telehealth: Payer: Self-pay | Admitting: *Deleted

## 2012-06-24 NOTE — Telephone Encounter (Signed)
  Follow up Call-  Call back number 06/21/2012  Post procedure Call Back phone  # [phone]  Permission to leave phone message Yes     Patient questions:  Do you have a fever, pain , or abdominal swelling? no Pain Score  0 *  Have you tolerated food without any problems? yes  Have you been able to return to your normal activities? yes  Do you have any questions about your discharge instructions: Diet   no Medications  no Follow up visit  no  Do you have questions or concerns about your Care? no  Actions: * If pain score is 4 or above: No action needed, pain <4.

## 2012-06-25 ENCOUNTER — Ambulatory Visit (INDEPENDENT_AMBULATORY_CARE_PROVIDER_SITE_OTHER): Payer: Medicare Other | Admitting: Radiology

## 2012-06-25 DIAGNOSIS — Z23 Encounter for immunization: Secondary | ICD-10-CM

## 2012-06-27 ENCOUNTER — Encounter: Payer: Self-pay | Admitting: Internal Medicine

## 2012-06-27 NOTE — Progress Notes (Signed)
Quick Note:  9 adenomas, serrated adenomas Repeat colon 1 year 10/ 2014 ______

## 2012-07-09 ENCOUNTER — Other Ambulatory Visit: Payer: Self-pay | Admitting: Dermatology

## 2012-07-22 ENCOUNTER — Other Ambulatory Visit: Payer: Self-pay | Admitting: Family Medicine

## 2012-08-26 ENCOUNTER — Encounter: Payer: Self-pay | Admitting: Family Medicine

## 2012-08-26 ENCOUNTER — Ambulatory Visit (INDEPENDENT_AMBULATORY_CARE_PROVIDER_SITE_OTHER): Payer: Medicare Other | Admitting: Family Medicine

## 2012-08-26 VITALS — BP 130/70 | HR 59 | Temp 98.6°F | Resp 16 | Ht 72.0 in | Wt 235.0 lb

## 2012-08-26 DIAGNOSIS — G4700 Insomnia, unspecified: Secondary | ICD-10-CM

## 2012-08-26 DIAGNOSIS — E785 Hyperlipidemia, unspecified: Secondary | ICD-10-CM

## 2012-08-26 MED ORDER — ZOLPIDEM TARTRATE 10 MG PO TABS: 5.0000 mg | ORAL_TABLET | Freq: Every evening | ORAL | Status: DC | PRN

## 2012-08-26 MED ORDER — SIMVASTATIN 20 MG PO TABS: 20.0000 mg | ORAL_TABLET | Freq: Every day | ORAL | Status: DC

## 2012-08-26 NOTE — Progress Notes (Signed)
Subjective:    Patient ID: Donald [name], male    DOB: [birth year], 71 y.o.   MRN: [id number]  HPI Donald [name] is a 71 y.o. male    Doing well overall.  Still with balance issues in leg, but may be slightly better - still doing Tai Chi. No recent PMandR visit.   Hyperlipidemia - last ov - 10/20/11. taking zocor 20mg  qd.  No new side effects. No new myalgias. Taking fish oil everyday.   Results for orders placed in visit on 10/20/11  LIPID PANEL      Component Value Range   Cholesterol 119  0 - 200 mg/dL   Triglycerides 409  <811 mg/dL   HDL 34 (*) >91 mg/dL   Total CHOL/HDL Ratio 3.5     VLDL 20  0 - 40 mg/dL   LDL Cholesterol 65  0 - 99 mg/dL  COMPREHENSIVE METABOLIC PANEL      Component Value Range   Sodium 145  135 - 145 mEq/L   Potassium 4.2  3.5 - 5.3 mEq/L   Chloride 109  96 - 112 mEq/L   CO2 28  19 - 32 mEq/L   Glucose, Bld 83  70 - 99 mg/dL   BUN 20  6 - 23 mg/dL   Creat 4.78  2.95 - 6.21 mg/dL   Total Bilirubin 0.5  0.3 - 1.2 mg/dL   Alkaline Phosphatase 60  39 - 117 U/L   AST 22  0 - 37 U/L   ALT 27  0 - 53 U/L   Total Protein 6.4  6.0 - 8.3 g/dL   Albumin 4.2  3.5 - 5.2 g/dL   Calcium 8.9  8.4 - 30.8 mg/dL  CBC      Component Value Range   WBC 6.4  4.0 - 10.5 K/uL   RBC 4.78  4.22 - 5.81 MIL/uL   Hemoglobin 13.9  13.0 - 17.0 g/dL   HCT 65.7  84.6 - 96.2 %   MCV 89.5  78.0 - 100.0 fL   MCH 29.1  26.0 - 34.0 pg   MCHC 32.5  30.0 - 36.0 g/dL   RDW 95.2  84.1 - 32.4 %   Platelets 197  150 - 400 K/uL  PSA      Component Value Range   PSA <0.01  <=4.00 ng/mL   Insomnia - stable. No parasomnias, still taking 1/2 of ambien at night, no daytime somnolence.  Difficulty with sleep when he tries a night off medicine. No new mood symptoms.   Hx of Prostate CA, s/p prostatectomy. Last psa ok.   Review of Systems As above. Otherwise negative.     Objective:   Physical Exam  Constitutional: He is oriented to person, place, and time. He appears  well-developed and well-nourished.  HENT:  Head: Normocephalic and atraumatic.  Eyes: EOM are normal. Pupils are equal, round, and reactive to light.  Neck: No JVD present. Carotid bruit is not present.  Cardiovascular: Normal rate, regular rhythm and normal heart sounds.   No murmur heard. Pulmonary/Chest: Effort normal and breath sounds normal. He has no rales.  Abdominal: Soft. Bowel sounds are normal. There is no tenderness.  Musculoskeletal: He exhibits no edema.  Neurological: He is alert and oriented to person, place, and time.  Skin: Skin is warm and dry.  Psychiatric: He has a normal mood and affect.       Assessment & Plan:  Donald [name] is a 71 y.o. male 1.  Insomnia  zolpidem (AMBIEN) 10 MG tablet - continue 1/2 at night, as long time use, no parasomnias, or daytime side effects Refills given.    2. Hyperlipidemia  simvastatin (ZOCOR) 20 MG tablet refilled.  Comprehensive metabolic panel, Lipid panel. No new changes for now. Not fasting today - will return for lab visit only.    Plan on Medicare wellness physical in next few months. - he will schedule this.   Patient Instructions  You can return as soon as tomorrow for fasting bloodwork. Your should receive a call or letter about your lab results within the next week to 10 days.  Plan on follow up for medicare wellness visit in few months.  We can recheck prostate test then.

## 2012-08-26 NOTE — Patient Instructions (Signed)
You can return as soon as tomorrow for fasting bloodwork. Your should receive a call or letter about your lab results within the next week to 10 days.  Plan on follow up for medicare wellness visit in few months.  We can recheck prostate test then.

## 2012-08-27 ENCOUNTER — Other Ambulatory Visit (INDEPENDENT_AMBULATORY_CARE_PROVIDER_SITE_OTHER): Payer: Medicare Other | Admitting: Family Medicine

## 2012-08-27 ENCOUNTER — Encounter: Payer: Self-pay | Admitting: Family Medicine

## 2012-08-27 DIAGNOSIS — E785 Hyperlipidemia, unspecified: Secondary | ICD-10-CM

## 2012-08-27 LAB — LIPID PANEL
HDL: 36 mg/dL — ABNORMAL LOW (ref >39)
LDL Cholesterol: 58 mg/dL (ref 0–99)
Total CHOL/HDL Ratio: 3.1 Ratio

## 2012-08-27 LAB — COMPREHENSIVE METABOLIC PANEL
AST: 25 U/L (ref 0–37)
Alkaline Phosphatase: 55 U/L (ref 39–117)
BUN: 16 mg/dL (ref 6–23)
Calcium: 8.7 mg/dL (ref 8.4–10.5)
Creat: 1.05 mg/dL (ref 0.50–1.35)

## 2012-08-27 NOTE — Progress Notes (Signed)
Pt here for labs only. 

## 2012-10-25 ENCOUNTER — Ambulatory Visit (INDEPENDENT_AMBULATORY_CARE_PROVIDER_SITE_OTHER): Payer: Medicare Other | Admitting: Emergency Medicine

## 2012-10-25 VITALS — BP 120/78 | HR 86 | Temp 98.5°F | Resp 16 | Ht 72.0 in | Wt 234.0 lb

## 2012-10-25 DIAGNOSIS — J018 Other acute sinusitis: Secondary | ICD-10-CM

## 2012-10-25 MED ORDER — PSEUDOEPHEDRINE-GUAIFENESIN ER 60-600 MG PO TB12: 1.0000 | ORAL_TABLET | Freq: Two times a day (BID) | ORAL | Status: DC

## 2012-10-25 MED ORDER — AMOXICILLIN-POT CLAVULANATE 875-125 MG PO TABS: 1.0000 | ORAL_TABLET | Freq: Two times a day (BID) | ORAL | Status: DC

## 2012-10-25 NOTE — Patient Instructions (Addendum)

## 2012-10-25 NOTE — Progress Notes (Signed)
[facility] [address] [place] [address] [phone]  Date:  10/25/2012   Name:  Donald [name]   DOB:  [birth year]   MRN:  [id number]  PCP:  Shade Flood, MD    Chief Complaint: Nasal Congestion, Sore Throat and Headache   History of Present Illness:  Donald [name] is a 72 y.o. very pleasant male patient who presents with the following:  Ill for three week with nasal congestion and drainage that is purulent.  No fever or chills.  Cough is productive occasionally of purulent sputum.  No wheezing or shortness of breath.  No nausea or vomiting.  No stool change.  No rash.  No flu shot.  Has a sore throat.  No sick contacts.  No improvement with OTC meds.  Patient Active Problem List  Diagnosis  . Personal history of colonic polyps  . Hyperlipidemia  . Insomnia  . Prostate cancer    Past Medical History  Diagnosis Date  . BPH (benign prostatic hyperplasia)   . Adenomatous polyps 10/2002, 06/2009  . Arthritis   . Prostate cancer   . Hyperlipemia     Past Surgical History  Procedure Laterality Date  . Cystoscopy  04/2010  . Retropubic prostatectomy  09/2003  . Hip arthroplasty  04/2010    Left - Dr. Turner Daniels, right 2012  . Colonoscopy w/ polypectomy  11/04/2002    Sigmoid adenomas x2, 1 cm maximum  . Esophagogastroduodenoscopy  11/04/2002    Duodenitis, gastritis without H. pylori   . Colonoscopy w/ polypectomy  06/08/2009    6 polyps, largest 8 mm, at least 3 adenomas    History  Substance Use Topics  . Smoking status: Former Games developer  . Smokeless tobacco: Never Used  . Alcohol Use: 0.6 oz/week    1 Cans of beer per week    Family History  Problem Relation Age of Onset  . Diabetes Mother   . Heart disease Mother   . Prostate cancer Father   . Colon cancer Neg Hx   . Stomach cancer Neg Hx   . Cancer Brother     prostate    Allergies  Allergen Reactions  . Iodine Anaphylaxis  . Ivp Dye (Iodinated Diagnostic Agents)  Anaphylaxis    Medication list has been reviewed and updated.  Current Outpatient Prescriptions on File Prior to Visit  Medication Sig Dispense Refill  . aspirin 81 MG tablet Take 81 mg by mouth daily.        . Multiple Vitamin (MULTIVITAMIN) capsule Take 1 capsule by mouth daily.        . Multiple Vitamins-Minerals (VISION VITAMINS PO) Take by mouth.        . simvastatin (ZOCOR) 20 MG tablet Take 1 tablet (20 mg total) by mouth daily.  90 tablet  1  . zolpidem (AMBIEN) 10 MG tablet Take 0.5 tablets (5 mg total) by mouth at bedtime as needed for sleep.  90 tablet  1   No current facility-administered medications on file prior to visit.    Review of Systems:  As per HPI, otherwise negative.    Physical Examination: Filed Vitals:   10/25/12 1036  BP: 120/78  Pulse: 86  Temp: 98.5 F (36.9 C)  Resp: 16   Filed Vitals:   10/25/12 1036  Height: 6' (1.829 m)  Weight: 234 lb (106.142 kg)   Body mass index is 31.73 kg/(m^2). Ideal Body Weight: Weight in (lb) to have BMI = 25: 183.9  GEN: WDWN, NAD, Non-toxic, A & O x 3 HEENT: Atraumatic, Normocephalic. Neck supple. No masses, No LAD. Ears and Nose: No external deformity. CV: RRR, No M/G/R. No JVD. No thrill. No extra heart sounds. PULM: CTA B, no wheezes, crackles, rhonchi. No retractions. No resp. distress. No accessory muscle use. ABD: S, NT, ND, +BS. No rebound. No HSM. EXTR: No c/c/e NEURO Normal gait.  PSYCH: Normally interactive. Conversant. Not depressed or anxious appearing.  Calm demeanor.    Assessment and Plan: Sinusitis augmentin mucinex d Follow up as needed  Carmelina Dane, MD

## 2012-11-25 ENCOUNTER — Encounter: Payer: Medicare Other | Admitting: Family Medicine

## 2012-12-02 ENCOUNTER — Encounter: Payer: Self-pay | Admitting: Family Medicine

## 2012-12-02 ENCOUNTER — Ambulatory Visit (INDEPENDENT_AMBULATORY_CARE_PROVIDER_SITE_OTHER): Payer: Medicare Other | Admitting: Family Medicine

## 2012-12-02 VITALS — BP 111/69 | HR 57 | Temp 97.1°F | Resp 16 | Ht 72.0 in | Wt 234.0 lb

## 2012-12-02 DIAGNOSIS — IMO0001 Reserved for inherently not codable concepts without codable children: Secondary | ICD-10-CM

## 2012-12-02 DIAGNOSIS — M791 Myalgia, unspecified site: Secondary | ICD-10-CM

## 2012-12-02 DIAGNOSIS — E785 Hyperlipidemia, unspecified: Secondary | ICD-10-CM

## 2012-12-02 DIAGNOSIS — G4700 Insomnia, unspecified: Secondary | ICD-10-CM

## 2012-12-02 DIAGNOSIS — Z0000 Encounter for general adult medical examination without abnormal findings: Secondary | ICD-10-CM

## 2012-12-02 DIAGNOSIS — Z8546 Personal history of malignant neoplasm of prostate: Secondary | ICD-10-CM

## 2012-12-02 LAB — POCT URINALYSIS DIPSTICK
Leukocytes, UA: NEGATIVE
pH, UA: 5.5

## 2012-12-02 MED ORDER — SIMVASTATIN 20 MG PO TABS: 20.0000 mg | ORAL_TABLET | Freq: Every day | ORAL | Status: DC

## 2012-12-02 MED ORDER — ZOLPIDEM TARTRATE 10 MG PO TABS: 5.0000 mg | ORAL_TABLET | Freq: Every evening | ORAL | Status: DC | PRN

## 2012-12-02 NOTE — Progress Notes (Signed)
Subjective:    Patient ID: Donald [name], male    DOB: 08-Feb-1941, 72 y.o.   MRN: [id number]  HPI [name] is a 72 y.o. male  Annual wellness visit -  optho visit last august.  Pneumovax 2/12, influenza vaccine 10/13.    Dentist - within last year. Tdap July 2008.   No prior zostavax.  Hearing;Some high frequency loss few years ago on audiometry - no recent changes or worsening.  Vision noted. Wears glasses.   Patient Active Problem List  Diagnosis  . Personal history of colonic polyps  . Hyperlipidemia  . Insomnia  . Prostate cancer   Hx of prostate CA -  Radical prostatectomy 09/22/2003.  No evidence of recurrence, with PSA<0.01, erectile dysfunction predating RRP. Urology:[facility]. PSA less than 0.01 in February 2013.   Colonoscopy due 10/14: Adenomas removed 2004 (2)  and September 2010 (4 + 2 not recovered). 06/21/2012 - 9 polyps removed, 8 recovered. Largest 1 cm. Serrated and tubular adenomas - repeat colon 06/2013  Insomnia - controlled for years on Ambien. Decreased in past from 10 to 5 mg and controlled on this dose. No parasomnias.   Hyperlipidemia - takes simvastatin 20mg  qd, and fish oil. No new large muscle group myalgias. Occasional pain and numbness down R arm - followed by Dr. Ave Filter. Has had cortisone injections.   Fell on R hip few weeks ago playing pick up game of pickle ball. Seen by ortho and had negative hip xrays. Has improved, was favoring L leg for awhile - hx of neuropathy/longstanding balance issues in L leg -- managed with [place] [employer]. some soreness in L hip past few days. Feels like it is getting better overall and R hip has also improved.  No pain w/ weight bearing.   Review of Systems  Musculoskeletal: Positive for arthralgias (L buttock to hip and R shoulder as above. ).  All other systems reviewed and are negative.       Objective:   Physical Exam  Vitals reviewed. Constitutional: He is oriented to person, place, and time.  He appears well-developed and well-nourished.  HENT:  Head: Normocephalic and atraumatic.  Right Ear: External ear normal.  Left Ear: External ear normal.  Mouth/Throat: Oropharynx is clear and moist.  Eyes: Conjunctivae and EOM are normal. Pupils are equal, round, and reactive to light.  Neck: Normal range of motion. Neck supple. No thyromegaly present.  Cardiovascular: Normal rate, regular rhythm, normal heart sounds and intact distal pulses.   Pulmonary/Chest: Effort normal and breath sounds normal. No respiratory distress. He has no wheezes.  Abdominal: Soft. He exhibits no distension. There is no tenderness. Hernia confirmed negative in the right inguinal area and confirmed negative in the left inguinal area.  Musculoskeletal: Normal range of motion. He exhibits no edema and no tenderness.       Right hip: He exhibits normal range of motion, normal strength and no tenderness.       Left hip: He exhibits normal range of motion, normal strength and no bony tenderness.       Legs: Lymphadenopathy:    He has no cervical adenopathy.  Neurological: He is alert and oriented to person, place, and time. He has normal reflexes.  Skin: Skin is warm and dry.  Psychiatric: He has a normal mood and affect. His behavior is normal.   Beck depression scale - 0.     Assessment & Plan:  Donald [name] is a 72 y.o. male Routine general medical examination  at a health care facility - Plan: POCT urinalysis dipstick  Other and unspecified hyperlipidemia - Plan: CK, Comprehensive metabolic panel, Lipid panel  Insomnia - Plan: zolpidem (AMBIEN) 10 MG tablet  H/O prostate cancer - Plan: PSA  Myalgia - Plan: CK  Hyperlipidemia - Plan: simvastatin (ZOCOR) 20 MG tablet  Annual Welnness exam: No concern for depression, ADL's intact, vision reviewed.  UTD on other vaccinations above.   rx for Zostavax vaccine given.  Anticipatory guidance given as below. Advanced directives - has living will -  will bring copy for chart.   Hyperlipidemia - check lipids, CMP.  Cont zocor qd.   Insomnia - stable on 5mg  Ambien, no new side effects including any parasomnias. Refills given,  L hip pain/myalgia - noted more in gluteus area/sciatic possible, but no pain with hip ROM/stregth testing. Cont sx care and tai chi as this has helped with strength and balance in past, but if pain persists - recheck next 1-2 weeks with possible xray, sooner if worse.   Hx of prostate CA - recheck psa.   Colon polyps- scheduled for repeat colonoscopy in 10/14.   Patient Instructions  Your should receive a call or letter about your lab results within the next week to 10 days.  Take the precription for your shingles vaccine to the pharmacy for injection.  Recheck in next week if your hip pain is not improving - Return to the clinic or go to the nearest emergency room if any of your symptoms worsen or new symptoms occur. Follow up in 6 months otherwise.  Keeping you healthy  Get these tests  Blood pressure- Have your blood pressure checked once a year by your healthcare provider.  Normal blood pressure is 120/80  Weight- Have your body mass index (BMI) calculated to screen for obesity.  BMI is a measure of body fat based on height and weight. You can also calculate your own BMI at [email or web address].  Cholesterol- Have your cholesterol checked every year.  Diabetes- Have your blood sugar checked regularly if you have high blood pressure, high cholesterol, have a family history of diabetes or if you are overweight.  Screening for Colon Cancer- Colonoscopy starting at age 56.  Screening may begin sooner depending on your family history and other health conditions. Follow up colonoscopy as directed by your Gastroenterologist.  Screening for Prostate Cancer- Both blood work (PSA) and a rectal exam help screen for Prostate Cancer.  Screening begins at age 9 with African-American men and at age 24 with Caucasian  men.  Screening may begin sooner depending on your family history.  Take these medicines  Aspirin- One aspirin daily can help prevent Heart disease and Stroke.  Flu shot- Every fall.  Tetanus- Every 10 years.  Zostavax- Once after the age of 69 to prevent Shingles.  Pneumonia shot- Once after the age of 60; if you are younger than 49, ask your healthcare provider if you need a Pneumonia shot.  Take these steps  Don't smoke- If you do smoke, talk to your doctor about quitting.  For tips on how to quit, go to www.smokefree.gov or call 1-800-QUIT-NOW.  Be physically active- Exercise 5 days a week for at least 30 minutes.  If you are not already physically active start slow and gradually work up to 30 minutes of moderate physical activity.  Examples of moderate activity include walking briskly, mowing the yard, dancing, swimming, bicycling, etc.  Eat a healthy diet- Eat a variety of healthy food such as  fruits, vegetables, low fat milk, low fat cheese, yogurt, lean meant, poultry, fish, beans, tofu, etc. For more information go to www.thenutritionsource.org  Drink alcohol in moderation- Limit alcohol intake to less than two drinks a day. Never drink and drive.  Dentist- Brush and floss twice daily; visit your dentist twice a year.  Depression- Your emotional health is as important as your physical health. If you're feeling down, or losing interest in things you would normally enjoy please talk to your healthcare provider.  Eye exam- Visit your eye doctor every year.  Safe sex- If you may be exposed to a sexually transmitted infection, use a condom.  Seat belts- Seat belts can save your life; always wear one.  Smoke/Carbon Monoxide detectors- These detectors need to be installed on the appropriate level of your home.  Replace batteries at least once a year.  Skin cancer- When out in the sun, cover up and use sunscreen 15 SPF or higher.  Violence- If anyone is threatening you, please  tell your healthcare provider.  Living Will/ Health care power of attorney- Speak with your healthcare provider and family.

## 2012-12-02 NOTE — Patient Instructions (Signed)
Your should receive a call or letter about your lab results within the next week to 10 days.  Take the precription for your shingles vaccine to the pharmacy for injection.  Recheck in next week if your hip pain is not improving - Return to the clinic or go to the nearest emergency room if any of your symptoms worsen or new symptoms occur. Follow up in 6 months otherwise.  Keeping you healthy  Get these tests  Blood pressure- Have your blood pressure checked once a year by your healthcare provider.  Normal blood pressure is 120/80  Weight- Have your body mass index (BMI) calculated to screen for obesity.  BMI is a measure of body fat based on height and weight. You can also calculate your own BMI at [email or web address].  Cholesterol- Have your cholesterol checked every year.  Diabetes- Have your blood sugar checked regularly if you have high blood pressure, high cholesterol, have a family history of diabetes or if you are overweight.  Screening for Colon Cancer- Colonoscopy starting at age 90.  Screening may begin sooner depending on your family history and other health conditions. Follow up colonoscopy as directed by your Gastroenterologist.  Screening for Prostate Cancer- Both blood work (PSA) and a rectal exam help screen for Prostate Cancer.  Screening begins at age 54 with African-American men and at age 26 with Caucasian men.  Screening may begin sooner depending on your family history.  Take these medicines  Aspirin- One aspirin daily can help prevent Heart disease and Stroke.  Flu shot- Every fall.  Tetanus- Every 10 years.  Zostavax- Once after the age of 30 to prevent Shingles.  Pneumonia shot- Once after the age of 60; if you are younger than 65, ask your healthcare provider if you need a Pneumonia shot.  Take these steps  Don't smoke- If you do smoke, talk to your doctor about quitting.  For tips on how to quit, go to www.smokefree.gov or call 1-800-QUIT-NOW.  Be  physically active- Exercise 5 days a week for at least 30 minutes.  If you are not already physically active start slow and gradually work up to 30 minutes of moderate physical activity.  Examples of moderate activity include walking briskly, mowing the yard, dancing, swimming, bicycling, etc.  Eat a healthy diet- Eat a variety of healthy food such as fruits, vegetables, low fat milk, low fat cheese, yogurt, lean meant, poultry, fish, beans, tofu, etc. For more information go to www.thenutritionsource.org  Drink alcohol in moderation- Limit alcohol intake to less than two drinks a day. Never drink and drive.  Dentist- Brush and floss twice daily; visit your dentist twice a year.  Depression- Your emotional health is as important as your physical health. If you're feeling down, or losing interest in things you would normally enjoy please talk to your healthcare provider.  Eye exam- Visit your eye doctor every year.  Safe sex- If you may be exposed to a sexually transmitted infection, use a condom.  Seat belts- Seat belts can save your life; always wear one.  Smoke/Carbon Monoxide detectors- These detectors need to be installed on the appropriate level of your home.  Replace batteries at least once a year.  Skin cancer- When out in the sun, cover up and use sunscreen 15 SPF or higher.  Violence- If anyone is threatening you, please tell your healthcare provider.  Living Will/ Health care power of attorney- Speak with your healthcare provider and family.

## 2012-12-03 LAB — COMPREHENSIVE METABOLIC PANEL
AST: 20 U/L (ref 0–37)
BUN: 18 mg/dL (ref 6–23)
Calcium: 8.7 mg/dL (ref 8.4–10.5)
Chloride: 110 mEq/L (ref 96–112)
Creat: 0.91 mg/dL (ref 0.50–1.35)
Glucose, Bld: 88 mg/dL (ref 70–99)

## 2012-12-03 LAB — LIPID PANEL
Cholesterol: 120 mg/dL (ref 0–200)
HDL: 35 mg/dL — ABNORMAL LOW (ref >39)
Total CHOL/HDL Ratio: 3.4 Ratio
Triglycerides: 65 mg/dL (ref <150)

## 2012-12-03 LAB — PSA: PSA: <0.01 ng/mL (ref <=4.00)

## 2012-12-08 ENCOUNTER — Ambulatory Visit (HOSPITAL_COMMUNITY)
Admission: RE | Admit: 2012-12-08 | Discharge: 2012-12-08 | Disposition: A | Discharge status: Home / Self Care | Payer: Medicare Other | Source: Ambulatory Visit | Attending: Emergency Medicine | Admitting: Emergency Medicine

## 2012-12-08 ENCOUNTER — Encounter: Payer: Self-pay | Admitting: Family Medicine

## 2012-12-08 ENCOUNTER — Ambulatory Visit (INDEPENDENT_AMBULATORY_CARE_PROVIDER_SITE_OTHER): Payer: Medicare Other | Admitting: Emergency Medicine

## 2012-12-08 VITALS — BP 130/74 | HR 55 | Temp 97.6°F | Resp 16 | Ht 72.25 in | Wt 233.8 lb

## 2012-12-08 DIAGNOSIS — J3489 Other specified disorders of nose and nasal sinuses: Secondary | ICD-10-CM | POA: Insufficient documentation

## 2012-12-08 DIAGNOSIS — R509 Fever, unspecified: Secondary | ICD-10-CM

## 2012-12-08 DIAGNOSIS — R51 Headache: Secondary | ICD-10-CM

## 2012-12-08 DIAGNOSIS — G319 Degenerative disease of nervous system, unspecified: Secondary | ICD-10-CM | POA: Insufficient documentation

## 2012-12-08 LAB — POCT CBC
Granulocyte percent: 80.2 %G — AB (ref 37–80)
HCT, POC: 45 % (ref 43.5–53.7)
Hemoglobin: 14.6 g/dL (ref 14.1–18.1)
Lymph, poc: 1.5 (ref 0.6–3.4)
MCHC: 32.4 g/dL (ref 31.8–35.4)
MPV: 7 fL (ref 0–99.8)
POC Granulocyte: 8 — AB (ref 2–6.9)
POC MID %: 4.6 %M (ref 0–12)

## 2012-12-08 MED ORDER — BUTALBITAL-APAP-CAFFEINE 50-500-40 MG PO TABS: 1.0000 | ORAL_TABLET | ORAL | Status: DC | PRN

## 2012-12-08 NOTE — Progress Notes (Signed)
I notified Donald [name] that the CT of his head, done at [facility] today, was negative. He was instructed to pick up an antibiotic at his pharmacy and return to the clinic if he is worse or continues to feel bad.

## 2012-12-08 NOTE — Patient Instructions (Addendum)

## 2012-12-08 NOTE — Progress Notes (Signed)
[facility] [address], [place] [address] [phone]  Date:  12/08/2012   Name:  Donald [name]   DOB:  1941/02/04   MRN:  [id number]  PCP:  Shade Flood, MD    Chief Complaint: Headache, Nausea and Insomnia   History of Present Illness:  Donald [name] is a 72 y.o. very pleasant male patient who presents with the following:  Ill since Friday morning with an unrelenting, severe left frontal headache.  Denies neuro or visual symptoms. No nausea or vomiting.  No cough or coryza, sore throat.  No appetite.  No rash or stool change.  Thinks he may have experienced a fever but never documented and no chills.  No neck pain.  Pain never changes.  No improvement with over the counter medications or other home remedies. Says that he was playing a ball game similar to tennis and tripped and fell three weeks ago.  Denies hitting head at that time and experienced no LOC or other neuro symptoms then.  Denies other complaint or health concern today.   Patient Active Problem List  Diagnosis  . Personal history of colonic polyps  . Hyperlipidemia  . Insomnia  . Prostate cancer    Past Medical History  Diagnosis Date  . BPH (benign prostatic hyperplasia)   . Adenomatous polyps 10/2002, 06/2009  . Arthritis   . Prostate cancer   . Hyperlipemia     Past Surgical History  Procedure Laterality Date  . Cystoscopy  04/2010  . Retropubic prostatectomy  09/2003  . Hip arthroplasty  04/2010    Left - Dr. Turner Daniels, right 2012  . Colonoscopy w/ polypectomy  11/04/2002    Sigmoid adenomas x2, 1 cm maximum  . Esophagogastroduodenoscopy  11/04/2002    Duodenitis, gastritis without H. pylori   . Colonoscopy w/ polypectomy  06/08/2009    6 polyps, largest 8 mm, at least 3 adenomas    History  Substance Use Topics  . Smoking status: Former Games developer  . Smokeless tobacco: Never Used  . Alcohol Use: 0.6 oz/week    1 Cans of beer per week    Family History  Problem  Relation Age of Onset  . Diabetes Mother   . Heart disease Mother   . Prostate cancer Father   . Colon cancer Neg Hx   . Stomach cancer Neg Hx   . Cancer Brother     prostate    Allergies  Allergen Reactions  . Iodine Anaphylaxis  . Ivp Dye (Iodinated Diagnostic Agents) Anaphylaxis    Medication list has been reviewed and updated.  Current Outpatient Prescriptions on File Prior to Visit  Medication Sig Dispense Refill  . aspirin 81 MG tablet Take 81 mg by mouth daily.        . Multiple Vitamin (MULTIVITAMIN) capsule Take 1 capsule by mouth daily.        . Multiple Vitamins-Minerals (VISION VITAMINS PO) Take by mouth.        . simvastatin (ZOCOR) 20 MG tablet Take 1 tablet (20 mg total) by mouth daily.  90 tablet  1  . zolpidem (AMBIEN) 10 MG tablet Take 0.5 tablets (5 mg total) by mouth at bedtime as needed for sleep.  90 tablet  1  . amoxicillin-clavulanate (AUGMENTIN) 875-125 MG per tablet Take 1 tablet by mouth 2 (two) times daily.  20 tablet  0  . pseudoephedrine-guaifenesin (MUCINEX D) 60-600 MG per tablet Take 1 tablet by mouth every 12 (twelve)  hours.  18 tablet  0   No current facility-administered medications on file prior to visit.    Review of Systems:  As per HPI, otherwise negative.    Physical Examination: Filed Vitals:   12/08/12 0936  BP: 130/74  Pulse: 55  Temp: 97.6 F (36.4 C)  Resp: 16   Filed Vitals:   12/08/12 0936  Height: 6' 0.25" (1.835 m)  Weight: 233 lb 12.8 oz (106.051 kg)   Body mass index is 31.5 kg/(m^2). Ideal Body Weight: Weight in (lb) to have BMI = 25: 185.2  GEN: WDWN, NAD, Non-toxic, A & O x 3 HEENT: Atraumatic, Normocephalic. Neck supple. No masses, No LAD.  PRRERLA EOMI Fundi benign.   Ears and Nose: No external deformity. CV: RRR, No M/G/R. No JVD. No thrill. No extra heart sounds. PULM: CTA B, no wheezes, crackles, rhonchi. No retractions. No resp. distress. No accessory muscle use. ABD: S, NT, ND, +BS. No rebound. No  HSM. EXTR: No c/c/e NEURO Normal gait. CN 2-12 intact.  Gross motor and cerebellar intact PSYCH: Normally interactive. Conversant. Not depressed or anxious appearing.  Calm demeanor.    Assessment and Plan: Headache fioricet Follow up as needed Signed,  Phillips Odor, MD   CT reported negative

## 2013-04-03 ENCOUNTER — Ambulatory Visit (INDEPENDENT_AMBULATORY_CARE_PROVIDER_SITE_OTHER): Payer: Medicare Other | Admitting: Family Medicine

## 2013-04-03 VITALS — BP 118/80 | HR 60 | Temp 98.1°F | Resp 18 | Ht 72.25 in | Wt 232.0 lb

## 2013-04-03 DIAGNOSIS — E785 Hyperlipidemia, unspecified: Secondary | ICD-10-CM

## 2013-04-03 LAB — LIPID PANEL
Cholesterol: 184 mg/dL (ref 0–200)
Total CHOL/HDL Ratio: 6.1 Ratio
Triglycerides: 155 mg/dL — ABNORMAL HIGH (ref <150)
VLDL: 31 mg/dL (ref 0–40)

## 2013-04-03 LAB — COMPREHENSIVE METABOLIC PANEL
Albumin: 4.2 g/dL (ref 3.5–5.2)
Alkaline Phosphatase: 50 U/L (ref 39–117)
BUN: 13 mg/dL (ref 6–23)
Calcium: 9 mg/dL (ref 8.4–10.5)
Chloride: 107 mEq/L (ref 96–112)
Creat: 1.06 mg/dL (ref 0.50–1.35)
Glucose, Bld: 78 mg/dL (ref 70–99)
Potassium: 4.2 mEq/L (ref 3.5–5.3)

## 2013-04-03 NOTE — Patient Instructions (Signed)
Continue fish oil. You should receive a call or letter about your lab results within the next week to 10 days. We can discuss options at that point, but ok to avoid statin for now.

## 2013-04-03 NOTE — Progress Notes (Signed)
Subjective:    Patient ID: Donald [name], male    DOB: July 04, 1941, 71 y.o.   MRN: [id number]  HPI Donald [name] is a 72 y.o. male  Hx of hyperlipidemia - prior on Zocor 20mg  QD. Lipids 12/02/12: TC 120, trig 65, HDL 35, LDL 72. CK wnl then at 206.   Did trial off statin 1 month ago as was having leg cramps.  Had cramps in both legs - thighs and calves.  Off meds 1 month - less cramps. Still few every now and then. Taking fish oil. Exercise: Tai Chi and walking.   Nonsmoker. Prior cigars in 1970.   Past Medical History  Diagnosis Date  . BPH (benign prostatic hyperplasia)   . Adenomatous polyps 10/2002, 06/2009  . Arthritis   . Prostate cancer   . Hyperlipemia    Past Surgical History  Procedure Laterality Date  . Cystoscopy  04/2010  . Retropubic prostatectomy  09/2003  . Hip arthroplasty  04/2010    Left - Dr. Turner Daniels, right 2012  . Colonoscopy w/ polypectomy  11/04/2002    Sigmoid adenomas x2, 1 cm maximum  . Esophagogastroduodenoscopy  11/04/2002    Duodenitis, gastritis without H. pylori   . Colonoscopy w/ polypectomy  06/08/2009    6 polyps, largest 8 mm, at least 3 adenomas  . Joint replacement    . Prostate surgery     Allergies  Allergen Reactions  . Iodine Anaphylaxis  . Ivp Dye (Iodinated Diagnostic Agents) Anaphylaxis   Prior to Admission medications   Medication Sig Start Date End Date Taking? Authorizing Provider  aspirin 81 MG tablet Take 81 mg by mouth daily.     Yes Historical Provider, MD  Multiple Vitamin (MULTIVITAMIN) capsule Take 1 capsule by mouth daily.     Yes Historical Provider, MD  Multiple Vitamins-Minerals (VISION VITAMINS PO) Take by mouth.     Yes Historical Provider, MD  zolpidem (AMBIEN) 10 MG tablet Take 0.5 tablets (5 mg total) by mouth at bedtime as needed for sleep. 12/02/12  Yes Shade Flood, MD  amoxicillin-clavulanate (AUGMENTIN) 875-125 MG per tablet Take 1 tablet by mouth 2 (two) times daily. 10/25/12   Phillips Odor, MD   butalbital-acetaminophen-caffeine (ESGIC PLUS) 50-500-40 MG per tablet Take 1 tablet by mouth every 4 (four) hours as needed for pain. 12/08/12   Phillips Odor, MD  pseudoephedrine-guaifenesin [facility] D) 60-600 MG per tablet Take 1 tablet by mouth every 12 (twelve) hours. 10/25/12 10/25/13  Phillips Odor, MD  simvastatin (ZOCOR) 20 MG tablet Take 1 tablet (20 mg total) by mouth daily. 12/02/12   Shade Flood, MD   History   Social History  . Marital Status: Married    Spouse Name: N/A    Number of Children: N/A  . Years of Education: N/A   Occupational History  . Retired     [employer]   Social History Main Topics  . Smoking status: Former Games developer  . Smokeless tobacco: Never Used  . Alcohol Use: 1.2 oz/week    2 Cans of beer per week  . Drug Use: No  . Sexually Active: Yes     Comment: number of sex partners in the last months 1, current birth control method - no prostate   Other Topics Concern  . Not on file   Social History Narrative   Exercise do Tai Chi daily and walking     Review of Systems As above.     Objective:   Physical Exam  Vitals reviewed. Constitutional: He is oriented to person, place, and time. He appears well-developed and well-nourished.  HENT:  Head: Normocephalic and atraumatic.  Eyes: EOM are normal. Pupils are equal, round, and reactive to light.  Neck: No JVD present. Carotid bruit is not present.  Cardiovascular: Normal rate, regular rhythm and normal heart sounds.   No murmur heard. Pulmonary/Chest: Effort normal and breath sounds normal. He has no rales.  Musculoskeletal: He exhibits no edema.  Neurological: He is alert and oriented to person, place, and time.  Skin: Skin is warm and dry.  Psychiatric: He has a normal mood and affect.        Assessment & Plan:  Donald [name] is a 72 y.o. male Hyperlipidemia - Plan: Lipid panel, Comprehensive metabolic panel  Ok to continue to hold off on statin for now as probable  intolerance with myalgias.  10 year ASCVD risk reviewed based on prior lipid readings - slight increase risk - 15 vs 13%, but if intolerant to stain may need to look at other options.   Patient Instructions  Continue fish oil. You should receive a call or letter about your lab results within the next week to 10 days. We can discuss options at that point, but ok to avoid statin for now.

## 2013-06-05 ENCOUNTER — Ambulatory Visit (INDEPENDENT_AMBULATORY_CARE_PROVIDER_SITE_OTHER): Payer: Medicare Other | Admitting: *Deleted

## 2013-06-05 DIAGNOSIS — Z23 Encounter for immunization: Secondary | ICD-10-CM

## 2013-06-05 NOTE — Addendum Note (Signed)
Addended by: Carollee Leitz L on: 06/05/2013 09:56 AM   Modules accepted: Level of Service

## 2013-07-22 ENCOUNTER — Telehealth: Payer: Self-pay

## 2013-07-22 DIAGNOSIS — G4700 Insomnia, unspecified: Secondary | ICD-10-CM

## 2013-07-22 NOTE — Telephone Encounter (Signed)
Pharm requests Rf of zolpidem 10 mg. Do you want to RF or have pt RTC to discuss?

## 2013-07-23 ENCOUNTER — Other Ambulatory Visit: Payer: Self-pay | Admitting: Radiology

## 2013-07-23 MED ORDER — ZOLPIDEM TARTRATE 10 MG PO TABS: 5.0000 mg | ORAL_TABLET | Freq: Every evening | ORAL | Status: DC | PRN

## 2013-07-23 NOTE — Telephone Encounter (Signed)
I can call the Rx in Dr Neva Seat, but wanted to check sig/quantities with you before I do. The sig is for .5 tablet Qhs and the quantity is for #90 w/1RF. Do you want the sig to read take 1/2 to 1 tab Qhs, or change quantity to #45? I just didn't want to send in what would be a year's worth without checking with you. I will call pt to advise you need to see him.

## 2013-07-23 NOTE — Telephone Encounter (Signed)
I will refill this.  I may not be able to come in and sign it until tomorrow morning. He should follow up in the next 3 monhs to discuss this and cholesterol as elevated last time.

## 2013-07-24 NOTE — Telephone Encounter (Signed)
I called [facility] and they did already have the Rx. The TL must have called it in. Notified pt that you want him to see you w/in next 3 mos and pt agreed. He stated that he started taking his simvastatin again after his chol was elevated last OV and will make appt to recheck it. Dr Neva Seat, [place]

## 2013-07-24 NOTE — Telephone Encounter (Signed)
Ok to send in way prescribed - this has been controlled at this dose for some time without side effects. I am unable to see Rx at TL desk. Was it sent already?

## 2013-08-12 ENCOUNTER — Encounter: Payer: Self-pay | Admitting: Internal Medicine

## 2013-08-19 ENCOUNTER — Encounter: Payer: Self-pay | Admitting: Internal Medicine

## 2013-08-26 ENCOUNTER — Other Ambulatory Visit: Payer: Self-pay | Admitting: Orthopedic Surgery

## 2013-09-08 ENCOUNTER — Encounter (HOSPITAL_COMMUNITY): Payer: Self-pay

## 2013-09-12 ENCOUNTER — Encounter (HOSPITAL_COMMUNITY): Payer: Self-pay

## 2013-09-12 ENCOUNTER — Encounter (HOSPITAL_COMMUNITY)
Admission: RE | Admit: 2013-09-12 | Discharge: 2013-09-12 | Disposition: A | Discharge status: Home / Self Care | Payer: Medicare Other | Source: Ambulatory Visit | Attending: Orthopedic Surgery | Admitting: Orthopedic Surgery

## 2013-09-12 DIAGNOSIS — Z01818 Encounter for other preprocedural examination: Secondary | ICD-10-CM | POA: Insufficient documentation

## 2013-09-12 DIAGNOSIS — Z01812 Encounter for preprocedural laboratory examination: Secondary | ICD-10-CM | POA: Insufficient documentation

## 2013-09-12 DIAGNOSIS — Z01810 Encounter for preprocedural cardiovascular examination: Secondary | ICD-10-CM | POA: Insufficient documentation

## 2013-09-12 LAB — COMPREHENSIVE METABOLIC PANEL
ALK PHOS: 57 U/L (ref 39–117)
ALT: 25 U/L (ref 0–53)
AST: 21 U/L (ref 0–37)
Albumin: 3.7 g/dL (ref 3.5–5.2)
BUN: 11 mg/dL (ref 6–23)
CALCIUM: 9.1 mg/dL (ref 8.4–10.5)
CO2: 26 mEq/L (ref 19–32)
Chloride: 105 mEq/L (ref 96–112)
Creatinine, Ser: 1.02 mg/dL (ref 0.50–1.35)
GFR calc Af Amer: 84 mL/min — ABNORMAL LOW (ref >90)
GFR, EST NON AFRICAN AMERICAN: 72 mL/min — AB (ref >90)
Glucose, Bld: 83 mg/dL (ref 70–99)
Potassium: 4.2 mEq/L (ref 3.7–5.3)
SODIUM: 143 meq/L (ref 137–147)
Total Bilirubin: 0.4 mg/dL (ref 0.3–1.2)
Total Protein: 6.9 g/dL (ref 6.0–8.3)

## 2013-09-12 LAB — TYPE AND SCREEN
ABO/RH(D): A NEG
Antibody Screen: NEGATIVE

## 2013-09-12 LAB — CBC WITH DIFFERENTIAL/PLATELET
Basophils Absolute: 0 10*3/uL (ref 0.0–0.1)
Basophils Relative: 0 % (ref 0–1)
EOS PCT: 5 % (ref 0–5)
Eosinophils Absolute: 0.3 10*3/uL (ref 0.0–0.7)
HEMATOCRIT: 41.5 % (ref 39.0–52.0)
Hemoglobin: 13.7 g/dL (ref 13.0–17.0)
LYMPHS ABS: 1.6 10*3/uL (ref 0.7–4.0)
LYMPHS PCT: 28 % (ref 12–46)
MCH: 27.8 pg (ref 26.0–34.0)
MCHC: 33 g/dL (ref 30.0–36.0)
MCV: 84.3 fL (ref 78.0–100.0)
MONO ABS: 0.4 10*3/uL (ref 0.1–1.0)
Monocytes Relative: 6 % (ref 3–12)
Neutro Abs: 3.6 10*3/uL (ref 1.7–7.7)
Neutrophils Relative %: 61 % (ref 43–77)
PLATELETS: 162 10*3/uL (ref 150–400)
RBC: 4.92 MIL/uL (ref 4.22–5.81)
RDW: 14.2 % (ref 11.5–15.5)
WBC: 5.8 10*3/uL (ref 4.0–10.5)

## 2013-09-12 LAB — URINALYSIS, ROUTINE W REFLEX MICROSCOPIC
Bilirubin Urine: NEGATIVE
Glucose, UA: NEGATIVE mg/dL
HGB URINE DIPSTICK: NEGATIVE
Ketones, ur: NEGATIVE mg/dL
Leukocytes, UA: NEGATIVE
Nitrite: NEGATIVE
PH: 7 (ref 5.0–8.0)
Protein, ur: NEGATIVE mg/dL
SPECIFIC GRAVITY, URINE: 1.007 (ref 1.005–1.030)
Urobilinogen, UA: 0.2 mg/dL (ref 0.0–1.0)

## 2013-09-12 LAB — PROTIME-INR
INR: 1.02 (ref 0.00–1.49)
Prothrombin Time: 13.2 seconds (ref 11.6–15.2)

## 2013-09-12 LAB — SURGICAL PCR SCREEN
MRSA, PCR: NEGATIVE
STAPHYLOCOCCUS AUREUS: POSITIVE — AB

## 2013-09-12 LAB — APTT: aPTT: 30 seconds (ref 24–37)

## 2013-09-12 NOTE — Pre-Procedure Instructions (Addendum)
MONTERRIUS CARDOSA  09/12/2013   Your procedure is scheduled on:  1/8/115  Report to [facility] short stay admitting at 1045 AM.  Call this number if you have problems the morning of surgery: [phone]   Remember:   Do not eat food or drink liquids after midnight.   Take these medicines the morning of surgery with A SIP OF WATER: NONE            STOP all herbel meds, nsaids (aleve,naproxen,advil,ibuprofen) 5 days prior to surgery 09/13/13 INCLUDING ASPIRIN ,VITAMINS   Do not wear jewelry, make-up or nail polish.  Do not wear lotions, powders, or perfumes. You may wear deodorant.  Do not shave 48 hours prior to surgery. Men may shave face and neck.  Do not bring valuables to the hospital.  [facility] is not responsible                  for any belongings or valuables.               Contacts, dentures or bridgework may not be worn into surgery.  Leave suitcase in the car. After surgery it may be brought to your room.  For patients admitted to the hospital, discharge time is determined by your                treatment team.               Patients discharged the day of surgery will not be allowed to drive  home.  Name and phone number of your driver:   Special Instructions: Incentive Spirometry - Practice and bring it with you on the day of surgery. Shower using CHG 2 nights before surgery and the night before surgery.  If you shower the day of surgery use CHG.  Use special wash - you have one bottle of CHG for all showers.  You should use approximately 1/3 of the bottle for each shower.   Please read over the following fact sheets that you were given: Pain Booklet, Coughing and Deep Breathing, Blood Transfusion Information, MRSA Information and Surgical Site Infection Prevention

## 2013-09-15 ENCOUNTER — Encounter: Payer: Self-pay | Admitting: Family Medicine

## 2013-09-15 ENCOUNTER — Ambulatory Visit (INDEPENDENT_AMBULATORY_CARE_PROVIDER_SITE_OTHER): Payer: Medicare Other | Admitting: Family Medicine

## 2013-09-15 ENCOUNTER — Encounter (HOSPITAL_COMMUNITY): Payer: Self-pay

## 2013-09-15 VITALS — BP 140/86 | HR 52 | Temp 98.4°F | Resp 16 | Ht 72.0 in | Wt 234.0 lb

## 2013-09-15 DIAGNOSIS — G4700 Insomnia, unspecified: Secondary | ICD-10-CM

## 2013-09-15 DIAGNOSIS — E785 Hyperlipidemia, unspecified: Secondary | ICD-10-CM

## 2013-09-15 MED ORDER — SIMVASTATIN 20 MG PO TABS: 20.0000 mg | ORAL_TABLET | Freq: Every day | ORAL | Status: DC

## 2013-09-15 NOTE — Progress Notes (Signed)
   Subjective:    Patient ID: Donald [name], male    DOB: [birth year], 73 y.o.   MRN: [id number]  HPI Donald [name] is a 73 y.o. male  Hyperlipidemia - taking zocor qd - no new myalgias,  Slight upper abd soreness - comes and goes - mild. No n/v.   Lab Results  Component Value Date   CHOL 184 04/03/2013   HDL 30* 04/03/2013   LDLCALC 123* 04/03/2013   TRIG 155* 04/03/2013   CHOLHDL 6.1 04/03/2013   Insomnia - 1/2- 1 ambien as needed, no daytime somnolence. No new side effects.   scheduled for mulitlevel decompression this Thursday of c spine - pain, numbness down r arm, even with head bent forward now - has progressed. Followed by [name].    Review of Systems  Constitutional: Negative for fever and chills.  Musculoskeletal: Positive for arthralgias (R arm with dysesthesias. ). Negative for myalgias.  Psychiatric/Behavioral: Behavioral problem: R arm with tingling.        Objective:   Physical Exam  Vitals reviewed. Constitutional: He is oriented to person, place, and time. He appears well-developed and well-nourished.  HENT:  Head: Normocephalic and atraumatic.  Eyes: EOM are normal. Pupils are equal, round, and reactive to light.  Neck: No JVD present. Carotid bruit is not present.  Cardiovascular: Normal rate, regular rhythm and normal heart sounds.   No murmur heard. Pulmonary/Chest: Effort normal and breath sounds normal. He has no rales.  Musculoskeletal: He exhibits no edema.  Neurological: He is alert and oriented to person, place, and time.  Skin: Skin is warm and dry.  Psychiatric: He has a normal mood and affect. His behavior is normal.   Filed Vitals:   09/15/13 1610  BP: 140/86  Pulse: 52  Temp: 98.4 F (36.9 C)  TempSrc: Oral  Resp: 16  Height: 6' (1.829 m)  Weight: 234 lb (106.142 kg)  SpO2: 96%      Assessment & Plan:   Donald [name] is a 73 y.o. male Hyperlipidemia - Plan: simvastatin (ZOCOR) 20 MG tablet refilled. Plan on FLP at  next fasting labs in few months with physical, as preop labs noted for upcoming neck surgery, and CMP ok.   Insomnia - stable - ambien 1/2-1 as needed. No new parasomnias.   Meds ordered this encounter  Medications  . simvastatin (ZOCOR) 20 MG tablet    Sig: Take 1 tablet (20 mg total) by mouth daily.    Dispense:  90 tablet    Refill:  1   Patient Instructions  Keep appointment for physical in next few months - can recheck cholesterol test then. Let me know if anything needed prior to that time.  If abdominal pain persists , or if any worsening - recheck sooner.  Return to the clinic or go to the nearest emergency room if any of your symptoms worsen or new symptoms occur.

## 2013-09-15 NOTE — Progress Notes (Signed)
Anesthesia Chart Review:  Patient is a 73 year old male scheduled for C4-5, C5-6, C6-7 ACDF on 09/18/13 by [name].  History includes former smoker, prostate cancer s/p prostatectomy, BPH, HLD, arthritis, adenomatous polyps, tonsillectomy, left THA '12. PCP is listed as Dr. [name].  EKG on 09/12/13 showed: SB at 54 bpm, incomplete right BBB. This was present on previous EKG from 01/09/11.  Preoperative CXR and labs noted.  Anticipate that he can proceed as planned.  Donald [name] [facility]Anesthesiology Phone [phone] 09/15/2013 3:03 PM

## 2013-09-15 NOTE — Patient Instructions (Signed)
Keep appointment for physical in next few months - can recheck cholesterol test then. Let me know if anything needed prior to that time.  If abdominal pain persists , or if any worsening - recheck sooner.  Return to the clinic or go to the nearest emergency room if any of your symptoms worsen or new symptoms occur.

## 2013-09-17 MED ORDER — CEFAZOLIN SODIUM-DEXTROSE 2-3 GM-% IV SOLR
2.0000 g | INTRAVENOUS | Status: AC
Start: 2013-09-18 — End: 2013-09-18
  Administered 2013-09-18 (×2): 2 g via INTRAVENOUS
  Filled 2013-09-17: qty 50

## 2013-09-17 NOTE — H&P (Signed)
PREOPERATIVE H&P  Chief Complaint: right arm pain   HPI: Donald [name] is a 73 y.o. male who presents with ongoing right arm pain. MRI revealed moderate bilateral stenosis from C4/5- C6/7 with varying degrees of spinal cord compression. The patient failed conservative care, and we did discuss proceeding with a C4-7 ACDF.  Past Medical History  Diagnosis Date  . BPH (benign prostatic hyperplasia)   . Adenomatous polyps 10/2002, 06/2009  . Arthritis   . Prostate cancer   . Hyperlipemia    Past Surgical History  Procedure Laterality Date  . Cystoscopy  04/2010  . Retropubic prostatectomy  09/2003  . Hip arthroplasty  04/2010    Left - Dr. Mayer Camel, right 2012  . Colonoscopy w/ polypectomy  11/04/2002    Sigmoid adenomas x2, 1 cm maximum  . Esophagogastroduodenoscopy  11/04/2002    Duodenitis, gastritis without H. pylori   . Colonoscopy w/ polypectomy  06/08/2009    6 polyps, largest 8 mm, at least 3 adenomas  . Joint replacement    . Prostate surgery    . Tonsillectomy     History   Social History  . Marital Status: Married    Spouse Name: N/A    Number of Children: N/A  . Years of Education: N/A   Occupational History  . Retired     [name]   Social History Main Topics  . Smoking status: Former Smoker    Types: Cigars    Quit date: 09/12/1972  . Smokeless tobacco: Never Used  . Alcohol Use: 1.2 oz/week    2 Cans of beer per week  . Drug Use: No  . Sexual Activity: Yes     Comment: number of sex partners in the last months 1, current birth control method - no prostate   Other Topics Concern  . Not on file   Social History Narrative   Exercise do Tai Chi daily and walking   Family History  Problem Relation Age of Onset  . Diabetes Mother   . Heart disease Mother   . Prostate cancer Father   . Colon cancer Neg Hx   . Stomach cancer Neg Hx   . Cancer Brother     prostate   Allergies  Allergen Reactions  . Iodine Anaphylaxis  . Ivp Dye [Iodinated  Diagnostic Agents] Anaphylaxis   Prior to Admission medications   Medication Sig Start Date End Date Taking? Authorizing Provider  aspirin 81 MG tablet Take 81 mg by mouth daily.     Yes Historical Provider, MD  Multiple Vitamin (MULTIVITAMIN) capsule Take 1 capsule by mouth daily.     Yes Historical Provider, MD  Multiple Vitamins-Minerals (VISION VITAMINS PO) Take by mouth.     Yes Historical Provider, MD  zolpidem (AMBIEN) 10 MG tablet Take 0.5 tablets (5 mg total) by mouth at bedtime as needed for sleep. 07/23/13  Yes [name], MD  simvastatin (ZOCOR) 20 MG tablet Take 1 tablet (20 mg total) by mouth daily. 09/15/13   [name], MD     All other systems have been reviewed and were otherwise negative with the exception of those mentioned in the HPI and as above.  Physical Exam: There were no vitals filed for this visit.  General: Alert, no acute distress Cardiovascular: No pedal edema Respiratory: No cyanosis, no use of accessory musculature GI: No organomegaly, abdomen is soft and non-tender Skin: No lesions in the area of chief complaint Neurologic: Sensation intact distally Psychiatric: Patient is  competent for consent with normal mood and affect Lymphatic: No axillary or cervical lymphadenopathy  MUSCULOSKELETAL: + spurling's on right  Assessment/Plan: Right arm pain Plan for Procedure(s): Anterior cervical decompression fusion, cervical 4-5, cervical 5-6, cervical 6-7 with instrumentation and allograft   Sinclair Ship, MD 09/17/2013 4:33 PM

## 2013-09-17 NOTE — Progress Notes (Signed)
Called pt to notify him of time change;states office told him to arrive at 0700

## 2013-09-18 ENCOUNTER — Inpatient Hospital Stay (HOSPITAL_COMMUNITY): Payer: Medicare Other

## 2013-09-18 ENCOUNTER — Other Ambulatory Visit: Payer: Self-pay

## 2013-09-18 ENCOUNTER — Encounter (HOSPITAL_COMMUNITY): Payer: Self-pay | Admitting: *Deleted

## 2013-09-18 ENCOUNTER — Inpatient Hospital Stay (HOSPITAL_COMMUNITY): Payer: Medicare Other | Admitting: Anesthesiology

## 2013-09-18 ENCOUNTER — Inpatient Hospital Stay (HOSPITAL_COMMUNITY)
Admission: RE | Admit: 2013-09-18 | Discharge: 2013-09-19 | DRG: 472 | Disposition: A | Discharge status: Home / Self Care | Payer: Medicare Other | Source: Ambulatory Visit | Attending: Orthopedic Surgery | Admitting: Orthopedic Surgery

## 2013-09-18 ENCOUNTER — Encounter (HOSPITAL_COMMUNITY)
Admission: RE | Disposition: A | Discharge status: Home / Self Care | Payer: Self-pay | Source: Ambulatory Visit | Attending: Orthopedic Surgery

## 2013-09-18 ENCOUNTER — Encounter (HOSPITAL_COMMUNITY): Payer: Medicare Other | Admitting: Vascular Surgery

## 2013-09-18 DIAGNOSIS — N400 Enlarged prostate without lower urinary tract symptoms: Secondary | ICD-10-CM | POA: Diagnosis present

## 2013-09-18 DIAGNOSIS — Z7982 Long term (current) use of aspirin: Secondary | ICD-10-CM

## 2013-09-18 DIAGNOSIS — M5410 Radiculopathy, site unspecified: Secondary | ICD-10-CM | POA: Diagnosis present

## 2013-09-18 DIAGNOSIS — M5030 Other cervical disc degeneration, unspecified cervical region: Secondary | ICD-10-CM | POA: Diagnosis present

## 2013-09-18 DIAGNOSIS — E785 Hyperlipidemia, unspecified: Secondary | ICD-10-CM | POA: Diagnosis present

## 2013-09-18 DIAGNOSIS — M4802 Spinal stenosis, cervical region: Principal | ICD-10-CM | POA: Diagnosis present

## 2013-09-18 DIAGNOSIS — Z87891 Personal history of nicotine dependence: Secondary | ICD-10-CM

## 2013-09-18 DIAGNOSIS — Z8546 Personal history of malignant neoplasm of prostate: Secondary | ICD-10-CM

## 2013-09-18 DIAGNOSIS — Z79899 Other long term (current) drug therapy: Secondary | ICD-10-CM

## 2013-09-18 DIAGNOSIS — G959 Disease of spinal cord, unspecified: Secondary | ICD-10-CM | POA: Diagnosis present

## 2013-09-18 HISTORY — PX: ANTERIOR CERVICAL DECOMP/DISCECTOMY FUSION: SHX1161

## 2013-09-18 SURGERY — ANTERIOR CERVICAL DECOMPRESSION/DISCECTOMY FUSION 3 LEVELS
Anesthesia: General | Site: Spine Cervical

## 2013-09-18 MED ORDER — BUPIVACAINE-EPINEPHRINE 0.25% -1:200000 IJ SOLN
INTRAMUSCULAR | Status: DC | PRN
  Administered 2013-09-18: 3 mL

## 2013-09-18 MED ORDER — THROMBIN 20000 UNITS EX SOLR
CUTANEOUS | Status: AC
  Filled 2013-09-18: qty 20000

## 2013-09-18 MED ORDER — MUPIROCIN 2 % EX OINT
TOPICAL_OINTMENT | CUTANEOUS | Status: AC
  Filled 2013-09-18: qty 22

## 2013-09-18 MED ORDER — ONDANSETRON HCL 4 MG/2ML IJ SOLN
INTRAMUSCULAR | Status: DC | PRN
  Administered 2013-09-18: 4 mg via INTRAVENOUS

## 2013-09-18 MED ORDER — ACETAMINOPHEN 325 MG PO TABS
650.0000 mg | ORAL_TABLET | ORAL | Status: DC | PRN
  Administered 2013-09-19: 650 mg via ORAL
  Filled 2013-09-18: qty 2

## 2013-09-18 MED ORDER — LACTATED RINGERS IV SOLN
INTRAVENOUS | Status: DC | PRN
  Administered 2013-09-18 (×3): via INTRAVENOUS

## 2013-09-18 MED ORDER — SODIUM CHLORIDE 0.9 % IJ SOLN: 3.0000 mL | INTRAMUSCULAR | Status: DC | PRN

## 2013-09-18 MED ORDER — MEPERIDINE HCL 25 MG/ML IJ SOLN: 6.2500 mg | INTRAMUSCULAR | Status: DC | PRN

## 2013-09-18 MED ORDER — BUPIVACAINE-EPINEPHRINE (PF) 0.25% -1:200000 IJ SOLN
INTRAMUSCULAR | Status: AC
  Filled 2013-09-18: qty 30

## 2013-09-18 MED ORDER — FLEET ENEMA 7-19 GM/118ML RE ENEM
1.0000 | ENEMA | Freq: Once | RECTAL | Status: AC | PRN
  Filled 2013-09-18: qty 1

## 2013-09-18 MED ORDER — CHLORHEXIDINE GLUCONATE 4 % EX LIQD: 60.0000 mL | Freq: Once | CUTANEOUS | Status: DC

## 2013-09-18 MED ORDER — FENTANYL CITRATE 0.05 MG/ML IJ SOLN
INTRAMUSCULAR | Status: DC | PRN
  Administered 2013-09-18 (×4): 50 ug via INTRAVENOUS
  Administered 2013-09-18: 100 ug via INTRAVENOUS
  Administered 2013-09-18: 150 ug via INTRAVENOUS

## 2013-09-18 MED ORDER — LACTATED RINGERS IV SOLN
INTRAVENOUS | Status: DC
  Administered 2013-09-18: 08:00:00 via INTRAVENOUS

## 2013-09-18 MED ORDER — NEOSTIGMINE METHYLSULFATE 1 MG/ML IJ SOLN
INTRAMUSCULAR | Status: DC | PRN
  Administered 2013-09-18: 3 mg via INTRAVENOUS

## 2013-09-18 MED ORDER — DIAZEPAM 5 MG PO TABS: 5.0000 mg | ORAL_TABLET | Freq: Four times a day (QID) | ORAL | Status: DC | PRN

## 2013-09-18 MED ORDER — LIDOCAINE HCL 4 % MT SOLN
OROMUCOSAL | Status: DC | PRN
  Administered 2013-09-18: 3 mL via TOPICAL

## 2013-09-18 MED ORDER — PROPOFOL 10 MG/ML IV BOLUS
INTRAVENOUS | Status: DC | PRN
  Administered 2013-09-18: 120 mg via INTRAVENOUS

## 2013-09-18 MED ORDER — GLYCOPYRROLATE 0.2 MG/ML IJ SOLN
INTRAMUSCULAR | Status: DC | PRN
  Administered 2013-09-18: .4 mg via INTRAVENOUS
  Administered 2013-09-18: 0.2 mg via INTRAVENOUS

## 2013-09-18 MED ORDER — SODIUM CHLORIDE 0.9 % IV SOLN
250.0000 mL | INTRAVENOUS | Status: DC
Start: 2013-09-18 — End: 2013-09-19

## 2013-09-18 MED ORDER — 0.9 % SODIUM CHLORIDE (POUR BTL) OPTIME
TOPICAL | Status: DC | PRN
  Administered 2013-09-18: 1000 mL

## 2013-09-18 MED ORDER — SIMVASTATIN 20 MG PO TABS
20.0000 mg | ORAL_TABLET | Freq: Every day | ORAL | Status: DC
  Administered 2013-09-18: 20 mg via ORAL
  Filled 2013-09-18 (×2): qty 1

## 2013-09-18 MED ORDER — PHENYLEPHRINE HCL 10 MG/ML IJ SOLN
10.0000 mg | INTRAVENOUS | Status: DC | PRN
  Administered 2013-09-18: 20 ug/min via INTRAVENOUS

## 2013-09-18 MED ORDER — MORPHINE SULFATE 2 MG/ML IJ SOLN: 1.0000 mg | INTRAMUSCULAR | Status: DC | PRN

## 2013-09-18 MED ORDER — ZOLPIDEM TARTRATE 5 MG PO TABS: 5.0000 mg | ORAL_TABLET | Freq: Every evening | ORAL | Status: DC | PRN

## 2013-09-18 MED ORDER — SENNOSIDES-DOCUSATE SODIUM 8.6-50 MG PO TABS
1.0000 | ORAL_TABLET | Freq: Every evening | ORAL | Status: DC | PRN
  Filled 2013-09-18: qty 1

## 2013-09-18 MED ORDER — BISACODYL 5 MG PO TBEC
5.0000 mg | DELAYED_RELEASE_TABLET | Freq: Every day | ORAL | Status: DC | PRN
  Filled 2013-09-18: qty 1

## 2013-09-18 MED ORDER — MIDAZOLAM HCL 5 MG/5ML IJ SOLN
INTRAMUSCULAR | Status: DC | PRN
  Administered 2013-09-18 (×2): 1 mg via INTRAVENOUS

## 2013-09-18 MED ORDER — SODIUM CHLORIDE 0.9 % IJ SOLN
3.0000 mL | Freq: Two times a day (BID) | INTRAMUSCULAR | Status: DC
  Administered 2013-09-18: 3 mL via INTRAVENOUS

## 2013-09-18 MED ORDER — CEFAZOLIN SODIUM 1-5 GM-% IV SOLN
1.0000 g | Freq: Three times a day (TID) | INTRAVENOUS | Status: AC
  Administered 2013-09-18 – 2013-09-19 (×2): 1 g via INTRAVENOUS
  Filled 2013-09-18 (×2): qty 50

## 2013-09-18 MED ORDER — HYDROMORPHONE HCL PF 1 MG/ML IJ SOLN: 0.2500 mg | INTRAMUSCULAR | Status: DC | PRN

## 2013-09-18 MED ORDER — DOCUSATE SODIUM 100 MG PO CAPS
100.0000 mg | ORAL_CAPSULE | Freq: Two times a day (BID) | ORAL | Status: DC
  Administered 2013-09-18: 100 mg via ORAL
  Filled 2013-09-18 (×3): qty 1

## 2013-09-18 MED ORDER — ONDANSETRON HCL 4 MG/2ML IJ SOLN: 4.0000 mg | Freq: Once | INTRAMUSCULAR | Status: DC | PRN

## 2013-09-18 MED ORDER — DEXTROSE 5 % IV SOLN
2.0000 g | INTRAVENOUS | Status: DC
  Filled 2013-09-18: qty 20

## 2013-09-18 MED ORDER — LIDOCAINE HCL (CARDIAC) 20 MG/ML IV SOLN
INTRAVENOUS | Status: DC | PRN
  Administered 2013-09-18: 100 mg via INTRAVENOUS

## 2013-09-18 MED ORDER — THROMBIN 20000 UNITS EX SOLR
CUTANEOUS | Status: DC | PRN
  Administered 2013-09-18: 10:00:00

## 2013-09-18 MED ORDER — MENTHOL 3 MG MT LOZG
1.0000 | LOZENGE | OROMUCOSAL | Status: DC | PRN
  Filled 2013-09-18: qty 9

## 2013-09-18 MED ORDER — OXYCODONE-ACETAMINOPHEN 5-325 MG PO TABS: 1.0000 | ORAL_TABLET | ORAL | Status: DC | PRN

## 2013-09-18 MED ORDER — ROCURONIUM BROMIDE 100 MG/10ML IV SOLN
INTRAVENOUS | Status: DC | PRN
  Administered 2013-09-18 (×3): 10 mg via INTRAVENOUS
  Administered 2013-09-18: 50 mg via INTRAVENOUS
  Administered 2013-09-18: 20 mg via INTRAVENOUS

## 2013-09-18 MED ORDER — MULTIVITAMINS PO CAPS: 1.0000 | ORAL_CAPSULE | Freq: Every day | ORAL | Status: DC

## 2013-09-18 MED ORDER — ALUM & MAG HYDROXIDE-SIMETH 200-200-20 MG/5ML PO SUSP: 30.0000 mL | Freq: Four times a day (QID) | ORAL | Status: DC | PRN

## 2013-09-18 MED ORDER — MUPIROCIN 2 % EX OINT
TOPICAL_OINTMENT | Freq: Two times a day (BID) | CUTANEOUS | Status: DC
  Administered 2013-09-18: 1 via NASAL

## 2013-09-18 MED ORDER — ACETAMINOPHEN 650 MG RE SUPP: 650.0000 mg | RECTAL | Status: DC | PRN

## 2013-09-18 MED ORDER — OXYCODONE HCL 5 MG PO TABS: 5.0000 mg | ORAL_TABLET | Freq: Once | ORAL | Status: DC | PRN

## 2013-09-18 MED ORDER — PHENOL 1.4 % MT LIQD
1.0000 | OROMUCOSAL | Status: DC | PRN
  Administered 2013-09-18: 1 via OROMUCOSAL

## 2013-09-18 MED ORDER — ONDANSETRON HCL 4 MG/2ML IJ SOLN: 4.0000 mg | INTRAMUSCULAR | Status: DC | PRN

## 2013-09-18 MED ORDER — OXYCODONE HCL 5 MG/5ML PO SOLN: 5.0000 mg | Freq: Once | ORAL | Status: DC | PRN

## 2013-09-18 SURGICAL SUPPLY — 76 items
BENZOIN TINCTURE PRP APPL 2/3 (GAUZE/BANDAGES/DRESSINGS) ×2 IMPLANT
BIT DRILL NEURO 2X3.1 SFT TUCH (MISCELLANEOUS) ×1 IMPLANT
BIT DRILL SRG 14X2.2XFLT CHK (BIT) ×1 IMPLANT
BIT DRL SRG 14X2.2XFLT CHK (BIT) ×1
BLADE SURG 15 STRL LF DISP TIS (BLADE) ×1 IMPLANT
BLADE SURG 15 STRL SS (BLADE) ×1
BLADE SURG ROTATE 9660 (MISCELLANEOUS) ×2 IMPLANT
BUR MATCHSTICK NEURO 3.0 LAGG (BURR) ×2 IMPLANT
CARTRIDGE OIL MAESTRO DRILL (MISCELLANEOUS) ×1 IMPLANT
CLOSURE STERI-STRIP 1/4X4 (GAUZE/BANDAGES/DRESSINGS) ×2 IMPLANT
CLOTH BEACON ORANGE TIMEOUT ST (SAFETY) ×2 IMPLANT
CORDS BIPOLAR (ELECTRODE) ×2 IMPLANT
COVER SURGICAL LIGHT HANDLE (MISCELLANEOUS) ×2 IMPLANT
CRADLE DONUT ADULT HEAD (MISCELLANEOUS) ×2 IMPLANT
DEVICE ENDSKLTN IMPLNT MED 6MM (Orthopedic Implant) ×1 IMPLANT
DEVICE ENDSKLTN TC MED 8MM (Orthopedic Implant) ×2 IMPLANT
DIFFUSER DRILL AIR PNEUMATIC (MISCELLANEOUS) ×2 IMPLANT
DRAIN JACKSON RD 7FR 3/32 (WOUND CARE) IMPLANT
DRAPE C-ARM 42X72 X-RAY (DRAPES) ×4 IMPLANT
DRAPE POUCH INSTRU U-SHP 10X18 (DRAPES) ×2 IMPLANT
DRAPE SURG 17X23 STRL (DRAPES) ×6 IMPLANT
DRILL BIT SKYLINE 14MM (BIT) ×1
DRILL NEURO 2X3.1 SOFT TOUCH (MISCELLANEOUS) ×2
DURAPREP 26ML APPLICATOR (WOUND CARE) ×2 IMPLANT
ELECT COATED BLADE 2.86 ST (ELECTRODE) ×2 IMPLANT
ELECT REM PT RETURN 9FT ADLT (ELECTROSURGICAL) ×2
ELECTRODE REM PT RTRN 9FT ADLT (ELECTROSURGICAL) ×1 IMPLANT
ENDOSKELETON IMPLANT MED 6MM (Orthopedic Implant) ×2 IMPLANT
ENDOSKELTON TC IMPLANT 8MM MED (Orthopedic Implant) ×4 IMPLANT
EVACUATOR SILICONE 100CC (DRAIN) IMPLANT
GAUZE SPONGE 4X4 16PLY XRAY LF (GAUZE/BANDAGES/DRESSINGS) ×2 IMPLANT
GLOVE BIO SURGEON STRL SZ7 (GLOVE) ×6 IMPLANT
GLOVE BIO SURGEON STRL SZ8 (GLOVE) ×2 IMPLANT
GLOVE BIOGEL PI IND STRL 7.0 (GLOVE) ×2 IMPLANT
GLOVE BIOGEL PI IND STRL 8 (GLOVE) ×1 IMPLANT
GLOVE BIOGEL PI INDICATOR 7.0 (GLOVE) ×2
GLOVE BIOGEL PI INDICATOR 8 (GLOVE) ×1
GOWN STRL NON-REIN LRG LVL3 (GOWN DISPOSABLE) ×2 IMPLANT
GOWN STRL REIN XL XLG (GOWN DISPOSABLE) ×2 IMPLANT
IV CATH 14GX2 1/4 (CATHETERS) ×2 IMPLANT
KIT BASIN OR (CUSTOM PROCEDURE TRAY) ×2 IMPLANT
KIT ROOM TURNOVER OR (KITS) ×2 IMPLANT
MANIFOLD NEPTUNE II (INSTRUMENTS) IMPLANT
NEEDLE 27GAX1X1/2 (NEEDLE) ×2 IMPLANT
NEEDLE SPNL 20GX3.5 QUINCKE YW (NEEDLE) ×2 IMPLANT
NS IRRIG 1000ML POUR BTL (IV SOLUTION) ×2 IMPLANT
OIL CARTRIDGE MAESTRO DRILL (MISCELLANEOUS) ×2
PACK ORTHO CERVICAL (CUSTOM PROCEDURE TRAY) ×2 IMPLANT
PAD ARMBOARD 7.5X6 YLW CONV (MISCELLANEOUS) ×4 IMPLANT
PATTIES SURGICAL .5 X.5 (GAUZE/BANDAGES/DRESSINGS) IMPLANT
PATTIES SURGICAL .5 X1 (DISPOSABLE) ×2 IMPLANT
PIN DISTRACTION 14 (PIN) ×4 IMPLANT
PLATE SKYLINE 3LVL 54MM SPINAL (Plate) ×2 IMPLANT
PUTTY BONE DBX 5CC MIX (Putty) ×2 IMPLANT
SCREW SKYLINE VAR OS 14MM (Screw) ×4 IMPLANT
SCREW VAR SELF TAP SKYLINE 14M (Screw) ×12 IMPLANT
SPONGE GAUZE 4X4 12PLY (GAUZE/BANDAGES/DRESSINGS) ×2 IMPLANT
SPONGE GAUZE 4X4 12PLY STER LF (GAUZE/BANDAGES/DRESSINGS) ×2 IMPLANT
SPONGE INTESTINAL PEANUT (DISPOSABLE) ×2 IMPLANT
SPONGE SURGIFOAM ABS GEL 100 (HEMOSTASIS) ×2 IMPLANT
STRIP CLOSURE SKIN 1/2X4 (GAUZE/BANDAGES/DRESSINGS) ×2 IMPLANT
SURGIFLO TRUKIT (HEMOSTASIS) IMPLANT
SUT MNCRL AB 4-0 PS2 18 (SUTURE) ×2 IMPLANT
SUT SILK 4 0 (SUTURE)
SUT SILK 4-0 18XBRD TIE 12 (SUTURE) IMPLANT
SUT VIC AB 2-0 CT2 18 VCP726D (SUTURE) ×2 IMPLANT
SYR BULB IRRIGATION 50ML (SYRINGE) ×2 IMPLANT
SYR CONTROL 10ML LL (SYRINGE) ×2 IMPLANT
TAPE CLOTH 4X10 WHT NS (GAUZE/BANDAGES/DRESSINGS) ×2 IMPLANT
TAPE CLOTH SURG 4X10 WHT LF (GAUZE/BANDAGES/DRESSINGS) ×2 IMPLANT
TAPE UMBILICAL COTTON 1/8X30 (MISCELLANEOUS) ×2 IMPLANT
TOWEL OR 17X24 6PK STRL BLUE (TOWEL DISPOSABLE) ×2 IMPLANT
TOWEL OR 17X26 10 PK STRL BLUE (TOWEL DISPOSABLE) ×2 IMPLANT
TRAY FOLEY CATH 16FRSI W/METER (SET/KITS/TRAYS/PACK) ×2 IMPLANT
WATER STERILE IRR 1000ML POUR (IV SOLUTION) IMPLANT
YANKAUER SUCT BULB TIP NO VENT (SUCTIONS) ×2 IMPLANT

## 2013-09-18 NOTE — Progress Notes (Signed)
Utilization review completed.  

## 2013-09-18 NOTE — Anesthesia Postprocedure Evaluation (Signed)
Anesthesia Post Note  Patient: Donald [name]  Procedure(s) Performed: Procedure(s) (LRB): Anterior cervical decompression fusion, cervical 4-5, cervical 5-6, cervical 6-7 with instrumentation and allograft (N/A)  Anesthesia type: general  Patient location: PACU  Post pain: Pain level controlled  Post assessment: Patient's Cardiovascular Status Stable  Last Vitals:  Filed Vitals:   09/18/13 1400  BP: 110/62  Pulse: 59  Temp: 37.3 C  Resp: 13    Post vital signs: Reviewed and stable  Level of consciousness: sedated  Complications: No apparent anesthesia complications

## 2013-09-18 NOTE — Transfer of Care (Signed)
Immediate Anesthesia Transfer of Care Note  Patient: Donald [name]  Procedure(s) Performed: Procedure(s) with comments: Anterior cervical decompression fusion, cervical 4-5, cervical 5-6, cervical 6-7 with instrumentation and allograft (N/A) - Anterior cervical decompression fusion, cervical 4-5, cervical 5-6, cervical 6-7 with instrumentation and allograft  Patient Location: PACU  Anesthesia Type:General  Level of Consciousness: awake, oriented and patient cooperative  Airway & Oxygen Therapy: Patient Spontanous Breathing and Patient connected to face mask oxygen  Post-op Assessment: Report given to PACU RN, Post -op Vital signs reviewed and stable, Patient moving all extremities, Patient moving all extremities X 4 and Patient able to stick tongue midline  Post vital signs: Reviewed and stable  Complications: No apparent anesthesia complications

## 2013-09-18 NOTE — Progress Notes (Signed)
Orthopedic Tech Progress Note Patient Details:  Donald [name] [birth year] [id number]  Ortho Devices Type of Ortho Device: [place] cervical collar Ortho Device/Splint Location: neck Ortho Device/Splint Interventions: Application   [name] 09/18/2013, 5:59 PM

## 2013-09-18 NOTE — Anesthesia Procedure Notes (Signed)
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

## 2013-09-18 NOTE — Anesthesia Preprocedure Evaluation (Addendum)
Anesthesia Evaluation  Patient identified by MRN, date of birth, ID band Patient awake    Reviewed: Allergy & Precautions, H&P , NPO status , Patient's Chart, lab work & pertinent test results  Airway Mallampati: II TM Distance: >3 FB Neck ROM: Full    Dental  (+) Dental Advisory Given   Pulmonary neg pulmonary ROS, former smoker,    Pulmonary exam normal       Cardiovascular negative cardio ROS  Rhythm:Regular     Neuro/Psych    GI/Hepatic negative GI ROS, Neg liver ROS,   Endo/Other  negative endocrine ROS  Renal/GU negative Renal ROS     Musculoskeletal   Abdominal Normal abdominal exam  (+)   Peds  Hematology negative hematology ROS (+)   Anesthesia Other Findings   Reproductive/Obstetrics                          Anesthesia Physical Anesthesia Plan  ASA: II  Anesthesia Plan: General   Post-op Pain Management:    Induction: Intravenous  Airway Management Planned: Oral ETT  Additional Equipment:   Intra-op Plan:   Post-operative Plan: Extubation in OR  Informed Consent: I have reviewed the patients History and Physical, chart, labs and discussed the procedure including the risks, benefits and alternatives for the proposed anesthesia with the patient or authorized representative who has indicated his/her understanding and acceptance.     Plan Discussed with: CRNA and Surgeon  Anesthesia Plan Comments:         Anesthesia Quick Evaluation

## 2013-09-19 NOTE — Op Note (Signed)
NAME:  Donald [name], HEARD NO.:  [id number]  MEDICAL RECORD NO.:  [id number]  LOCATION:  3C11C                        FACILITY:  [facility]  PHYSICIAN:  Phylliss Bob, MD      DATE OF BIRTH:  [birth year]  DATE OF PROCEDURE:  09/18/2013 DATE OF DISCHARGE:                              OPERATIVE REPORT   POSTOPERATIVE DIAGNOSES: 1. Severe right-sided cervical radiculopathy. 2. 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  POSTOPERATIVE DIAGNOSES: 1. Severe right-sided cervical radiculopathy. 2. 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  OPERATION: 1. C4-5, C5-6, C6-7 anterior cervical decompression and fusion. 2. Placement of anterior instrumentation, C4, C5, C6, C7. 3. Insertion of interbody device x3 (tightened interbody spacers). 4. Use of morselized allograft. 5. Intraoperative use of fluoroscopy.  SURGEON:  Phylliss Bob, MD  ASSISTANT:  Pricilla Holm, PA-C  ANESTHESIA:  General endotracheal anesthesia.  COMPLICATIONS:  None.  DISPOSITION:  Stable.  ESTIMATED BLOOD LOSS:  Minimal.  INDICATIONS FOR PROCEDURE:  Briefly, Mr. Cone is a very pleasant 73- year-old male, who did present to me on July 01, 2013, with rather severe pain in his right arm.  I did review an MRI, which was clearly notable for moderate-to-severe right-sided neural foraminal compression at C4-5, C5-6, as well as C6-7.  There was also noted to be substantial spinal cord compression with stenosis noted at C5-6.  The patient did continue to have ongoing and severe pain in the right arm.  He had a markedly positive Spurling sign on the right and he did like a triceps reflex on the right.  Given the patient's ongoing pain and  dysfunction, we did discuss proceeding with the procedure reflected above.  The patient did fully understand the risks and limitations of the surgery as outlined in my preoperative note.  OPERATIVE DETAILS:  On September 18, 2013, the patient was brought to Surgery and general endotracheal anesthesia was administered.  The patient was placed supine on a hospital bed.  The neck was placed into a gentle degree of extension.  The patient's arms, which were secured to the sides.  All bony prominences were padded meticulously.  Antibiotics were given and a time-out procedure was performed.  The neck was prepped and draped in the usual sterile fashion.  I then made a left-sided transverse incision overlying the C5-6 interspace.  A lateral fluoroscopic view did confirm the appropriate operative levels.  I then subperiosteally exposed the vertebral bodies of C4 through C7.  Of note, there were prominent osteophytes noted anteriorly, which were removed using a rongeur.  I then turned my attention towards the C6-7 interspace.  A Self-retaining Shadow-Line retractor was placed.  Caspar pins were placed into the vertebral bodies above and below the C6-7 interspace.  Distraction was applied.  I then went forward with a diskectomy in the usual manner using a 15-blade knife followed by a series of curettes.  Of note, there were substantial and significant  osteophytes noted posteriorly, which were taken down using a high-speed bur in addition to a series of curettes and Kerrison punches.  I was able to perform a thorough central decompression with a right and left neural foraminal decompression.  I then placed a series of interbody spacers trials after preparing the endplates.  The appropriate-sized interbody spacer was packed with DBX Mix and tamped into position in the usual fashion.  The Caspar pin from the vertebral body below was removed, then bone wax was placed in its place.  I then placed  the Caspar pin into the C5 vertebral body.  Distraction was then applied across the C5-6 interspace.  A diskectomy was then performed in the manner as previously described.  Once again, a central and right-sided neural foraminal decompression was performed, which was confirmed using a nerve hook.  I then prepared the endplates and packed an appropriate- sized interbody spacer with DBX Mix, which was tamped into position in the usual fashion.  The Caspar pin from the vertebral body below was removed, and bone wax was placed in its place.  I then placed the Caspar pin into the C4 vertebral body.  Distraction was again applied.  Once again, a diskectomy was performed as previously noted.  Once again, a thorough central neural foraminal decompression was performed bilaterally and was confirmed using a nerve hook.  The endplates were prepared and an appropriate-sized interbody spacer was packed with DBX Mix and tamped into position.  The Caspar pins were removed and bone wax was placed in their place.  I then chose an appropriate-sized anterior cervical plate, which was placed over the anterior cervical spine.  14- mm variable angle screws were placed in each vertebral body from C4-C7 for a total of eight vertebral body screws.  The CAM locking mechanism was utilized per the manufacture's recommendation.  An additional fluoroscopic view did confirm appropriate positioning of the hardware. I then copiously irrigated the wound.  I then explored the wound for any undue bleeding, and none was encountered.  I then closed the platysma using 2-0 Vicryl.  The skin was then closed using 3-0 Monocryl.  Benzoin and Steri-Strips were applied followed by sterile dressing.  All instrument counts were correct at the termination of the procedure.  Of note, Pricilla Holm was my assistant throughout the entirety of the procedure, and did aide in essential retraction and suctioning needed throughout the  entirety of the surgery.     Phylliss Bob, MD     MD/MEDQ  D:  09/18/2013  T:  09/19/2013  Job:  [id number]  cc:   [name], M.D.

## 2013-09-19 NOTE — Progress Notes (Signed)
Patient doing well, s/p 4-7 acdf. Right arm pain resolved.  BP 108/70  Pulse 57  Temp(Src) 99.4 F (37.4 C) (Oral)  Resp 16  SpO2 93%  NVI Dressing intact Collar in place  POD #1 after 4-7 ACDF, doing well  - philly collar to bedside - aspen collar x 4 weeks - f/u 2 weeks, d/c home today

## 2013-09-19 NOTE — Progress Notes (Signed)
Pt doing very well. Pt and wife given D/C instructions with Rx's. Pt verbalized understanding of D/C. Pt D/C'd home via wheelchair @ 0900 with wife per MD order. Pt stable @ D/C and had no other needs. Holli Humbles, RN

## 2013-09-22 ENCOUNTER — Encounter (HOSPITAL_COMMUNITY): Payer: Self-pay | Admitting: Orthopedic Surgery

## 2013-09-22 ENCOUNTER — Ambulatory Visit: Payer: Medicare Other | Admitting: Family Medicine

## 2013-10-07 NOTE — Discharge Summary (Signed)
Patient ID: Donald [name] MRN: [id number] DOB/AGE: [birth year] 73 y.o.  Admit date: 09/18/2013 Discharge date: 10/07/2013  Admission Diagnoses:  Active Problems:   Radicular pain   Discharge Diagnoses:  Same  Past Medical History  Diagnosis Date  . BPH (benign prostatic hyperplasia)   . Adenomatous polyps 10/2002, 06/2009  . Arthritis   . Prostate cancer   . Hyperlipemia     Surgeries: Procedure(s): Anterior cervical decompression fusion, cervical 4-5, cervical 5-6, cervical 6-7 with instrumentation and allograft on 09/18/2013   Consultants:    Discharged Condition: Improved  Hospital Course: Donald [name] is an 73 y.o. male who was admitted 09/18/2013 for operative treatment of<principal problem not specified>. Patient has severe unremitting pain that affects sleep, daily activities, and work/hobbies. After pre-op clearance the patient was taken to the operating room on 09/18/2013 and underwent  Procedure(s): Anterior cervical decompression fusion, cervical 4-5, cervical 5-6, cervical 6-7 with instrumentation and allograft.    Patient was given perioperative antibiotics: Anti-infectives   Start     Dose/Rate Route Frequency Ordered Stop   09/18/13 2200  ceFAZolin (ANCEF) IVPB 1 g/50 mL premix     1 g 100 mL/hr over 30 Minutes Intravenous Every 8 hours 09/18/13 1658 09/19/13 0621   09/18/13 1400  ceFAZolin (ANCEF) 2 g in dextrose 5 % 50 mL IVPB  Status:  Discontinued     2 g 100 mL/hr over 30 Minutes Intravenous To Surgery 09/18/13 1352 09/19/13 1217   09/18/13 0600  ceFAZolin (ANCEF) IVPB 2 g/50 mL premix     2 g 100 mL/hr over 30 Minutes Intravenous On call to O.R. 09/17/13 1357 09/18/13 1339       Patient was given sequential compression devices, early ambulation, and chemoprophylaxis to prevent DVT.  Patient benefited maximally from hospital stay and there were no complications.    Recent vital signs: No data found.    Recent laboratory studies: No results found  for this basename: WBC, HGB, HCT, PLT, NA, K, CL, CO2, BUN, CREATININE, GLUCOSE, PT, INR, CALCIUM, 2,  in the last 72 hours   Discharge Medications:     Medication List    STOP taking these medications       aspirin 81 MG tablet      TAKE these medications       multivitamin capsule  Take 1 capsule by mouth daily.     simvastatin 20 MG tablet  Commonly known as:  ZOCOR  Take 1 tablet (20 mg total) by mouth daily.     VISION VITAMINS PO  Take by mouth.     zolpidem 10 MG tablet  Commonly known as:  AMBIEN  Take 0.5 tablets (5 mg total) by mouth at bedtime as needed for sleep.        Diagnostic Studies: Dg Chest 2 View  09/12/2013   CLINICAL DATA:  History prostate cancer. History of arthritis. Preoperative chest.  EXAM: CHEST  2 VIEW  COMPARISON:  01/09/2011.  FINDINGS: No acute cardiopulmonary disease. Stable bilateral tiny pulmonary nodules noted suggesting vessels on end and or stable granulomas. Heart size normal. Degenerative changes thoracic spine and both shoulders.  IMPRESSION: No active cardiopulmonary disease.   Electronically Signed   By: Marcello Moores  Register   On: 09/12/2013 10:44   Dg Cervical Spine 1 View  09/18/2013   CLINICAL DATA:  ACDF C4 through C7  EXAM: DG C-ARM 61-120 MIN; DG CERVICAL SPINE - 1 VIEW  TECHNIQUE: Two lateral intraoperative fluoroscopic spot images.  COMPARISON:  MR CERVICAL SPINE W/O CM dated 07/12/2013  FLUOROSCOPY TIME:  11 seconds  FINDINGS: Two lateral intraoperative fluoroscopic spot images are provided. ACDF at C4 -C7 with interbody cages without failure or complication. The C6 and C7 screws are suboptimally visualized. There is a bridging osteophyte anteriorly at C3-4. There is an endotracheal tube and nasogastric tube noted.  IMPRESSION: ACDF at C4-C7.   Electronically Signed   By: Kathreen Devoid   On: 09/18/2013 14:29   Dg C-arm 61-120 Min  09/18/2013   CLINICAL DATA:  ACDF C4 through C7  EXAM: DG C-ARM 61-120 MIN; DG CERVICAL SPINE - 1 VIEW   TECHNIQUE: Two lateral intraoperative fluoroscopic spot images.  COMPARISON:  MR CERVICAL SPINE W/O CM dated 07/12/2013  FLUOROSCOPY TIME:  11 seconds  FINDINGS: Two lateral intraoperative fluoroscopic spot images are provided. ACDF at C4 -C7 with interbody cages without failure or complication. The C6 and C7 screws are suboptimally visualized. There is a bridging osteophyte anteriorly at C3-4. There is an endotracheal tube and nasogastric tube noted.  IMPRESSION: ACDF at C4-C7.   Electronically Signed   By: Kathreen Devoid   On: 09/18/2013 14:29   Dg Outside Films Spine  09/18/2013   This examination belongs to an outside facility and is stored  here for comparison purposes only.  Contact the originating outside  institution for any associated report or interpretation.   Disposition: 01-Home or Self Care      Discharge Orders   Future Appointments Provider Department Dept Phone   10/17/2013 8:30 AM [facility] Previsit Rm [place] [phone]   11/06/2013 1:30 PM Gatha Mayer, MD [place] [phone]   Future Orders Complete By Expires   Discharge patient  As directed          Signed: Justice Britain 10/07/2013, 9:12 AM Patient ID: Donald [name] MRN: [id number] DOB/AGE: 1941-01-08 89 y.o.  Admit date: 09/18/2013 Discharge date: 09/19/2013  Admission Diagnoses:  Active Problems:   Radicular pain   Discharge Diagnoses:  Same  Past Medical History  Diagnosis Date  . BPH (benign prostatic hyperplasia)   . Adenomatous polyps 10/2002, 06/2009  . Arthritis   . Prostate cancer   . Hyperlipemia     Surgeries: Procedure(s): Anterior cervical decompression fusion, cervical 4-5, cervical 5-6, cervical 6-7 with instrumentation and allograft on 09/18/2013   Consultants: none  Discharged Condition: Improved  Hospital Course: Donald [name] is an 73 y.o. male who was admitted 09/18/2013 for operative treatment of cervical  radiculopathy, right. Patient has severe unremitting pain that affects sleep, daily activities, and work/hobbies. After pre-op clearance the patient was taken to the operating room on 09/18/2013 and underwent  Procedure(s): Anterior cervical decompression fusion, cervical 4-5, cervical 5-6, cervical 6-7 with instrumentation and allograft.    Patient was given perioperative antibiotics: Anti-infectives   Start     Dose/Rate Route Frequency Ordered Stop   09/18/13 2200  ceFAZolin (ANCEF) IVPB 1 g/50 mL premix     1 g 100 mL/hr over 30 Minutes Intravenous Every 8 hours 09/18/13 1658 09/19/13 0621   09/18/13 1400  ceFAZolin (ANCEF) 2 g in dextrose 5 % 50 mL IVPB  Status:  Discontinued     2 g 100 mL/hr over 30 Minutes Intravenous To Surgery 09/18/13 1352 09/19/13 1217   09/18/13 0600  ceFAZolin (ANCEF) IVPB 2 g/50 mL premix     2 g 100 mL/hr over 30 Minutes Intravenous On call to O.R.  09/17/13 1357 09/18/13 1339       Patient was given sequential compression devices, early ambulation to prevent DVT.  Patient benefited maximally from hospital stay and there were no complications.    Recent vital signs: BP 129/77  Pulse 56  Temp(Src) 98.7 F (37.1 C) (Oral)  Resp 16  SpO2 92%   Discharge Medications:     Medication List    STOP taking these medications       aspirin 81 MG tablet      TAKE these medications       multivitamin capsule  Take 1 capsule by mouth daily.     simvastatin 20 MG tablet  Commonly known as:  ZOCOR  Take 1 tablet (20 mg total) by mouth daily.     VISION VITAMINS PO  Take by mouth.     zolpidem 10 MG tablet  Commonly known as:  AMBIEN  Take 0.5 tablets (5 mg total) by mouth at bedtime as needed for sleep.        Diagnostic Studies: Dg Chest 2 View  09/12/2013   CLINICAL DATA:  History prostate cancer. History of arthritis. Preoperative chest.  EXAM: CHEST  2 VIEW  COMPARISON:  01/09/2011.  FINDINGS: No acute cardiopulmonary disease. Stable bilateral  tiny pulmonary nodules noted suggesting vessels on end and or stable granulomas. Heart size normal. Degenerative changes thoracic spine and both shoulders.  IMPRESSION: No active cardiopulmonary disease.   Electronically Signed   By: Marcello Moores  Register   On: 09/12/2013 10:44   Dg Cervical Spine 1 View  09/18/2013   CLINICAL DATA:  ACDF C4 through C7  EXAM: DG C-ARM 61-120 MIN; DG CERVICAL SPINE - 1 VIEW  TECHNIQUE: Two lateral intraoperative fluoroscopic spot images.  COMPARISON:  MR CERVICAL SPINE W/O CM dated 07/12/2013  FLUOROSCOPY TIME:  11 seconds  FINDINGS: Two lateral intraoperative fluoroscopic spot images are provided. ACDF at C4 -C7 with interbody cages without failure or complication. The C6 and C7 screws are suboptimally visualized. There is a bridging osteophyte anteriorly at C3-4. There is an endotracheal tube and nasogastric tube noted.  IMPRESSION: ACDF at C4-C7.   Electronically Signed   By: Kathreen Devoid   On: 09/18/2013 14:29   Dg C-arm 61-120 Min  09/18/2013   CLINICAL DATA:  ACDF C4 through C7  EXAM: DG C-ARM 61-120 MIN; DG CERVICAL SPINE - 1 VIEW  TECHNIQUE: Two lateral intraoperative fluoroscopic spot images.  COMPARISON:  MR CERVICAL SPINE W/O CM dated 07/12/2013  FLUOROSCOPY TIME:  11 seconds  FINDINGS: Two lateral intraoperative fluoroscopic spot images are provided. ACDF at C4 -C7 with interbody cages without failure or complication. The C6 and C7 screws are suboptimally visualized. There is a bridging osteophyte anteriorly at C3-4. There is an endotracheal tube and nasogastric tube noted.  IMPRESSION: ACDF at C4-C7.   Electronically Signed   By: Kathreen Devoid   On: 09/18/2013 14:29   Dg Outside Films Spine  09/18/2013   This examination belongs to an outside facility and is stored  here for comparison purposes only.  Contact the originating outside  institution for any associated report or interpretation.   Disposition: 01-Home or Self Care      Discharge Orders   Future  Appointments Provider Department Dept Phone   10/17/2013 8:30 AM [facility] Previsit Rm [place] [phone]   11/06/2013 1:30 PM Gatha Mayer, MD [place] [phone]   Future Orders Complete By Expires   Discharge  patient  As directed      POD #1 after 4-7 ACDF, doing well  - philly collar to bedside  - aspen collar x 4 weeks  - f/u 2 weeks, d/c home today      Signed: Justice Britain 10/07/2013, 9:12 AM

## 2013-10-17 ENCOUNTER — Ambulatory Visit (AMBULATORY_SURGERY_CENTER): Payer: Self-pay | Admitting: *Deleted

## 2013-10-17 VITALS — Ht 72.0 in | Wt 224.0 lb

## 2013-10-17 DIAGNOSIS — Z86010 Personal history of colonic polyps: Secondary | ICD-10-CM

## 2013-10-17 MED ORDER — NA SULFATE-K SULFATE-MG SULF 17.5-3.13-1.6 GM/177ML PO SOLN: 1.0000 | Freq: Once | ORAL | Status: DC

## 2013-10-17 NOTE — Progress Notes (Signed)
No egg or soy allergy. ewm No CPAP or home 02 use. ewm No problems with past sedation. ewm Pt declined emmi video. ewm Pt had cervical fusion 09-18-2013 and  has a neck brace in place at [facility] 10-17-13. Pt anticipates brace off by the 2-26 colon visit. Will have to have neck support at the procedure. ewm

## 2013-10-28 ENCOUNTER — Encounter: Payer: Self-pay | Admitting: Internal Medicine

## 2013-10-29 ENCOUNTER — Encounter: Payer: Medicare Other | Admitting: Internal Medicine

## 2013-10-31 ENCOUNTER — Encounter: Payer: Medicare Other | Admitting: Internal Medicine

## 2013-11-06 ENCOUNTER — Encounter: Payer: Medicare Other | Admitting: Internal Medicine

## 2013-11-21 ENCOUNTER — Encounter: Payer: Self-pay | Admitting: Internal Medicine

## 2013-11-21 ENCOUNTER — Ambulatory Visit (AMBULATORY_SURGERY_CENTER): Payer: Medicare Other | Admitting: Internal Medicine

## 2013-11-21 VITALS — BP 114/70 | HR 51 | Temp 97.5°F | Resp 18 | Ht 72.0 in | Wt 224.0 lb

## 2013-11-21 DIAGNOSIS — D126 Benign neoplasm of colon, unspecified: Secondary | ICD-10-CM

## 2013-11-21 DIAGNOSIS — K648 Other hemorrhoids: Secondary | ICD-10-CM

## 2013-11-21 DIAGNOSIS — Z86010 Personal history of colonic polyps: Secondary | ICD-10-CM

## 2013-11-21 MED ORDER — SODIUM CHLORIDE 0.9 % IV SOLN: 500.0000 mL | INTRAVENOUS | Status: DC

## 2013-11-21 NOTE — Op Note (Signed)
[place]  [address] [place], [id number]   COLONOSCOPY PROCEDURE REPORT  PATIENT: Donald, [name].  MR#: [id number] BIRTHDATE: 1941-01-03 , 72  yrs. old GENDER: Male ENDOSCOPIST: Gatha Mayer, MD, [facility] PROCEDURE DATE:  11/21/2013 PROCEDURE:   Colonoscopy with snare polypectomy First Screening Colonoscopy - Avg.  risk and is 50 yrs.  old or older - No.  Prior Negative Screening - Now for repeat screening. N/A  History of Adenoma - Now for follow-up colonoscopy & has been > or = to 3 yrs.  No.  It has been less than 3 yrs since last colonoscopy.  Other: See Comments  Polyps Removed Today? Yes. ASA CLASS:   Class II INDICATIONS:Patient's personal history of adenomatous colon polyps. Closer f/u due to # of adenomas/serrated polyps removed previously MEDICATIONS: propofol (Diprivan) 300mg  IV, MAC sedation, administered by CRNA, and These medications were titrated to patient response per physician's verbal order  DESCRIPTION OF PROCEDURE:   After the risks benefits and alternatives of the procedure were thoroughly explained, informed consent was obtained.  A digital rectal exam revealed a surgically absent prostate.   The [id number]  endoscope was introduced through the anus and advanced to the cecum, which was identified by both the appendix and ileocecal valve. No adverse events experienced.   The quality of the prep was excellent using Suprep  The instrument was then slowly withdrawn as the colon was fully examined.  COLON FINDINGS: Three diminutive sessile polyps were found in the ascending colon.  A polypectomy was performed with a cold snare. The resection was complete and the polyp tissue was partially retrieved 9one polyp not retrieved).   The colon mucosa was otherwise normal.  Retroflexed views revealed no abnormalities. The time to cecum=3 minutes 30 seconds.  Withdrawal time=13 minutes 35 seconds.  The scope was withdrawn and the  procedure completed. COMPLICATIONS: There were no complications.  ENDOSCOPIC IMPRESSION: 1.   Three diminutive sessile polyps were found in the ascending colon; polypectomy was performed with a cold snare 2 of 3 recovered 2.   The colon mucosa was otherwise normal - hx multiple adenomas/serrated adenomas  RECOMMENDATIONS: Timing of repeat colonoscopy will be determined by pathology findings. Should be at least 3 years from now.   eSigned:  Gatha Mayer, MD, [facility] 11/21/2013 4:24 PM   cc: The Patient

## 2013-11-21 NOTE — Progress Notes (Signed)
Called to room to assist during endoscopic procedure.  Patient ID and intended procedure confirmed with present staff. Received instructions for my participation in the procedure from the performing physician.  

## 2013-11-21 NOTE — Patient Instructions (Signed)
Impressions/recommedations:  Polyps (handout given)  Repeat colonoscopy pending pathology results.  YOU HAD AN ENDOSCOPIC PROCEDURE TODAY AT [facility]: Refer to the procedure report that was given to you for any specific questions about what was found during the examination.  If the procedure report does not answer your questions, please call your gastroenterologist to clarify.  If you requested that your care partner not be given the details of your procedure findings, then the procedure report has been included in a sealed envelope for you to review at your convenience later.  YOU SHOULD EXPECT: Some feelings of bloating in the abdomen. Passage of more gas than usual.  Walking can help get rid of the air that was put into your GI tract during the procedure and reduce the bloating. If you had a lower endoscopy (such as a colonoscopy or flexible sigmoidoscopy) you may notice spotting of blood in your stool or on the toilet paper. If you underwent a bowel prep for your procedure, then you may not have a normal bowel movement for a few days.  DIET: Your first meal following the procedure should be a light meal and then it is ok to progress to your normal diet.  A half-sandwich or bowl of soup is an example of a good first meal.  Heavy or fried foods are harder to digest and may make you feel nauseous or bloated.  Likewise meals heavy in dairy and vegetables can cause extra gas to form and this can also increase the bloating.  Drink plenty of fluids but you should avoid alcoholic beverages for 24 hours.  ACTIVITY: Your care partner should take you home directly after the procedure.  You should plan to take it easy, moving slowly for the rest of the day.  You can resume normal activity the day after the procedure however you should NOT DRIVE or use heavy machinery for 24 hours (because of the sedation medicines used during the test).    SYMPTOMS TO REPORT IMMEDIATELY: A  gastroenterologist can be reached at any hour.  During normal business hours, 8:30 AM to 5:00 PM Monday through Friday, call [phone].  After hours and on weekends, please call the GI answering service at [phone] who will take a message and have the physician on call contact you.   Following lower endoscopy (colonoscopy or flexible sigmoidoscopy):  Excessive amounts of blood in the stool  Significant tenderness or worsening of abdominal pains  Swelling of the abdomen that is new, acute  Fever of 100F or higher  FOLLOW UP: If any biopsies were taken you will be contacted by phone or by letter within the next 1-3 weeks.  Call your gastroenterologist if you have not heard about the biopsies in 3 weeks.  Our staff will call the home number listed on your records the next business day following your procedure to check on you and address any questions or concerns that you may have at that time regarding the information given to you following your procedure. This is a courtesy call and so if there is no answer at the home number and we have not heard from you through the emergency physician on call, we will assume that you have returned to your regular daily activities without incident.  SIGNATURES/CONFIDENTIALITY: You and/or your care partner have signed paperwork which will be entered into your electronic medical record.  These signatures attest to the fact that that the information above on your After Visit Summary has been  reviewed and is understood.  Full responsibility of the confidentiality of this discharge information lies with you and/or your care-partner. 

## 2013-11-21 NOTE — Progress Notes (Signed)
  [facility] Post-op Note  Patient: Donald [name]  Procedure(s) Performed: colonoscopy  Patient Location: [facility] - Recovery Area  Anesthesia Type: Deep Sedation/Propofol  Level of Consciousness: awake, oriented and patient cooperative  Airway and Oxygen Therapy: Patient Spontanous Breathing  Post-op Pain: none  Post-op Assessment:  Post-op Vital signs reviewed, Patient's Cardiovascular Status Stable, Respiratory Function Stable, Patent Airway, No signs of Nausea or vomiting and Pain level controlled  Post-op Vital Signs: Reviewed and stable  Complications: No apparent anesthesia complications  Navjot Loera E 4:25 PM

## 2013-11-24 ENCOUNTER — Telehealth: Payer: Self-pay | Admitting: *Deleted

## 2013-11-24 NOTE — Telephone Encounter (Signed)
  Follow up Call-  Call back number 11/21/2013 06/21/2012  Post procedure Call Back phone  # [phone]  Permission to leave phone message Yes Yes     Patient questions:  Do you have a fever, pain , or abdominal swelling? no Pain Score  0 *  Have you tolerated food without any problems? yes  Have you been able to return to your normal activities? yes  Do you have any questions about your discharge instructions: Diet   no Medications  no Follow up visit  no  Do you have questions or concerns about your Care? no  Actions: * If pain score is 4 or above: No action needed, pain <4.

## 2013-11-25 ENCOUNTER — Encounter: Payer: Self-pay | Admitting: Internal Medicine

## 2013-11-25 NOTE — Progress Notes (Signed)
Quick Note:  Correction repeat colonoscopy 3 years 2018 ______

## 2013-11-25 NOTE — Progress Notes (Signed)
Quick Note:  Sessile serrated polyp and a tubular adenoma (2 of 3 polyps removed - one not recovered) Repeat colon 2020 ______

## 2014-01-22 ENCOUNTER — Encounter: Payer: Self-pay | Admitting: Family Medicine

## 2014-01-22 ENCOUNTER — Telehealth: Payer: Self-pay | Admitting: Family Medicine

## 2014-01-22 ENCOUNTER — Ambulatory Visit (INDEPENDENT_AMBULATORY_CARE_PROVIDER_SITE_OTHER): Payer: Medicare Other | Admitting: Family Medicine

## 2014-01-22 VITALS — BP 122/70 | HR 52 | Temp 97.9°F | Resp 16 | Ht 71.0 in | Wt 227.4 lb

## 2014-01-22 DIAGNOSIS — Z23 Encounter for immunization: Secondary | ICD-10-CM

## 2014-01-22 MED ORDER — ZOSTER VACCINE LIVE 19400 UNT/0.65ML ~~LOC~~ SOLR: 0.6500 mL | Freq: Once | SUBCUTANEOUS | Status: DC

## 2014-01-22 NOTE — Progress Notes (Signed)
See phone note

## 2014-01-22 NOTE — Telephone Encounter (Signed)
In office to go ahead and get rx for shingles vaccine, as wife has recently been diagnosed with this. . This has been discussed at prior ov/CPE.  rx provided.

## 2014-01-22 NOTE — Telephone Encounter (Signed)
On review of chart - he was prescribed Zostavax in 11/2012.  He now remembers this and thinks he did receive the injection. He will check into this with his pharmacy, then call back if not had done as I can rx again if needed. rx from earlier this am cancelled.

## 2014-02-05 ENCOUNTER — Other Ambulatory Visit: Payer: Self-pay

## 2014-02-05 DIAGNOSIS — G4700 Insomnia, unspecified: Secondary | ICD-10-CM

## 2014-02-05 MED ORDER — ZOLPIDEM TARTRATE 10 MG PO TABS: 5.0000 mg | ORAL_TABLET | Freq: Every evening | ORAL | Status: DC | PRN

## 2014-02-05 NOTE — Telephone Encounter (Signed)
Pharm requests RF of zolpidem. Pended for review, I did not change the quantity/RFs

## 2014-02-05 NOTE — Telephone Encounter (Signed)
Addressed insomnia in January - no parasomnias or new side effects of meds. Refilled.

## 2014-02-05 NOTE — Telephone Encounter (Signed)
Faxed

## 2014-04-21 ENCOUNTER — Other Ambulatory Visit: Payer: Self-pay | Admitting: Family Medicine

## 2014-05-19 ENCOUNTER — Other Ambulatory Visit: Payer: Self-pay | Admitting: Physician Assistant

## 2014-05-25 ENCOUNTER — Encounter: Payer: Self-pay | Admitting: Family Medicine

## 2014-05-25 ENCOUNTER — Ambulatory Visit (INDEPENDENT_AMBULATORY_CARE_PROVIDER_SITE_OTHER): Payer: Medicare Other | Admitting: Family Medicine

## 2014-05-25 VITALS — BP 130/70 | HR 55 | Temp 98.3°F | Resp 16 | Ht 71.5 in | Wt 228.0 lb

## 2014-05-25 DIAGNOSIS — E785 Hyperlipidemia, unspecified: Secondary | ICD-10-CM

## 2014-05-25 DIAGNOSIS — Z23 Encounter for immunization: Secondary | ICD-10-CM

## 2014-05-25 MED ORDER — SIMVASTATIN 20 MG PO TABS: 20.0000 mg | ORAL_TABLET | Freq: Every day | ORAL | Status: DC

## 2014-05-25 NOTE — Patient Instructions (Signed)
You should receive a call or letter about your lab results within next week to 10 days after blood drawn. No changes in meds at this time.

## 2014-05-25 NOTE — Progress Notes (Addendum)
Subjective:    Patient ID: Donald [name], male    DOB: 27-Jul-1941, 73 y.o.   MRN: [id number] This chart was scribed for [name], MD by Cathie Hoops, ED Scribe. The patient was seen in Room 24. The patient's care was started at 4:58 PM.   05/25/2014  Medication Refill  HPI HPI Comments: Donald [name] is a 73 y.o. male who presents to the [facility] medication refill of simvastatin 20 mg per day.   Last lipid panel was in July of 2014 Lab Results  Component Value Date   CHOL 184 04/03/2013   HDL 30* 04/03/2013   LDLCALC 123* 04/03/2013   TRIG 155* 04/03/2013   CHOLHDL 6.1 04/03/2013   He was having some cramps with the simvastatin, and was recommend to stop medication. LFTs normal in January of this year.  Hyperlipidemia  Pt is currently taking his simvastatin daily with no adverse effects. Pt does Tai Chi regularly. Pt has chronic difficulty with balance in the left leg. Pt denies chest pain, difficulty breathing, dizziness or light-headedness. Pt likes to exercise via walking.   Review of Systems  Constitutional: Negative for fatigue and unexpected weight change.  Eyes: Negative for visual disturbance.  Respiratory: Negative for cough, chest tightness and shortness of breath.   Cardiovascular: Negative for chest pain, palpitations and leg swelling.  Gastrointestinal: Negative for abdominal pain and blood in stool.  Neurological: Negative for dizziness, light-headedness and headaches.  All other systems reviewed and are negative.   Past Medical History  Diagnosis Date  . BPH (benign prostatic hyperplasia)   . Adenomatous polyps 10/2002, 06/2009  . Arthritis   . Prostate cancer   . Hyperlipemia    Past Surgical History  Procedure Laterality Date  . Cystoscopy  04/2010  . Retropubic prostatectomy  09/2003  . Hip arthroplasty  04/2010    Left - Dr. Mayer Camel, right 2012  . Colonoscopy w/ polypectomy  11/04/2002    Sigmoid adenomas x2, 1 cm  maximum  . Esophagogastroduodenoscopy  11/04/2002    Duodenitis, gastritis without H. pylori   . Colonoscopy w/ polypectomy  06/08/2009    6 polyps, largest 8 mm, at least 3 adenomas  . Joint replacement    . Prostate surgery    . Tonsillectomy    . Anterior cervical decomp/discectomy fusion N/A 09/18/2013    Procedure: Anterior cervical decompression fusion, cervical 4-5, cervical 5-6, cervical 6-7 with instrumentation and allograft;  Surgeon: Sinclair Ship, MD;  Location: [place];  Service: Orthopedics;  Laterality: N/A;  Anterior cervical decompression fusion, cervical 4-5, cervical 5-6, cervical 6-7 with instrumentation and allograft  . Colonoscopy    . Upper gastrointestinal endoscopy     Allergies  Allergen Reactions  . Iodine Anaphylaxis  . Ivp Dye [Iodinated Diagnostic Agents] Anaphylaxis   Current Outpatient Prescriptions  Medication Sig Dispense Refill  . aspirin 81 MG chewable tablet Chew by mouth daily.      . Multiple Vitamin (MULTIVITAMIN) capsule Take 1 capsule by mouth daily.        . Multiple Vitamins-Minerals (VISION VITAMINS PO) Take by mouth.        . simvastatin (ZOCOR) 20 MG tablet Take 1 tablet (20 mg total) by mouth daily. NO MORE REFILLS WITHOUT OFFICE VISIT - 2ND NOTICE  15 tablet  0  . zolpidem (AMBIEN) 10 MG tablet Take 0.5 tablets (5 mg total) by mouth at bedtime as needed for sleep.  90 tablet  1  .  Na Sulfate-K Sulfate-Mg Sulf (SUPREP BOWEL PREP) SOLN Take 1 kit by mouth once. suprep as directed. No substitutions  354 mL  0   No current facility-administered medications for this visit.   Objective:  Physical Exam  Nursing note and vitals reviewed. Constitutional: He is oriented to person, place, and time. He appears well-developed and well-nourished.  HENT:  Head: Normocephalic and atraumatic.  Eyes: EOM are normal. Pupils are equal, round, and reactive to light.  Neck: No JVD present. Carotid bruit is not present.  Cardiovascular: Normal rate,  regular rhythm and normal heart sounds.   No murmur heard. Pulmonary/Chest: Effort normal and breath sounds normal. He has no rales.  Musculoskeletal: He exhibits no edema.  Neurological: He is alert and oriented to person, place, and time.  Skin: Skin is warm and dry.  Psychiatric: He has a normal mood and affect.    Filed Vitals:   05/25/14 1621  BP: 130/70  Pulse: 55  Temp: 98.3 F (36.8 C)  TempSrc: Oral  Resp: 16  Height: 5' 11.5" (1.816 m)  Weight: 228 lb (103.42 kg)  SpO2: 96%    Assessment & Plan:  5:06 PM- Patient informed of current plan for treatment and evaluation and agrees with plan at this time. Donald [name] is a 73 y.o. male Need for prophylactic vaccination and inoculation against influenza - Plan: Flu Vaccine QUAD 36+ mos IM given.   Hyperlipidemia - Plan: Lipid panel, COMPLETE METABOLIC PANEL WITH GFR, simvastatin (ZOCOR) 20 MG tablet  -recheck lipids and CMP at lab only visit for fasting draw.  Tolerating QD dosing of zocor. Plan on cpe in about 6 months.   Meds ordered this encounter  Medications  . simvastatin (ZOCOR) 20 MG tablet    Sig: Take 1 tablet (20 mg total) by mouth daily.    Dispense:  90 tablet    Refill:  1   Patient Instructions  You should receive a call or letter about your lab results within next week to 10 days after blood drawn. No changes in meds at this time.      I personally performed the services described in this documentation, which was scribed in my presence. The recorded information has been reviewed and considered, and addended by me as needed.

## 2014-05-26 ENCOUNTER — Other Ambulatory Visit (INDEPENDENT_AMBULATORY_CARE_PROVIDER_SITE_OTHER): Payer: Medicare Other | Admitting: Radiology

## 2014-05-26 DIAGNOSIS — Z23 Encounter for immunization: Secondary | ICD-10-CM

## 2014-05-26 DIAGNOSIS — E785 Hyperlipidemia, unspecified: Secondary | ICD-10-CM

## 2014-05-26 LAB — COMPLETE METABOLIC PANEL WITH GFR
ALT: 33 U/L (ref 0–53)
AST: 25 U/L (ref 0–37)
Albumin: 4.1 g/dL (ref 3.5–5.2)
Alkaline Phosphatase: 48 U/L (ref 39–117)
BUN: 15 mg/dL (ref 6–23)
CO2: 28 mEq/L (ref 19–32)
CREATININE: 1.02 mg/dL (ref 0.50–1.35)
Calcium: 8.9 mg/dL (ref 8.4–10.5)
Chloride: 106 mEq/L (ref 96–112)
GFR, EST NON AFRICAN AMERICAN: 74 mL/min
GFR, Est African American: 85 mL/min
GLUCOSE: 83 mg/dL (ref 70–99)
Potassium: 4.1 mEq/L (ref 3.5–5.3)
Sodium: 143 mEq/L (ref 135–145)
Total Bilirubin: 0.5 mg/dL (ref 0.2–1.2)
Total Protein: 6.2 g/dL (ref 6.0–8.3)

## 2014-05-26 LAB — LIPID PANEL
CHOL/HDL RATIO: 3.2 ratio
CHOLESTEROL: 120 mg/dL (ref 0–200)
HDL: 37 mg/dL — ABNORMAL LOW (ref >39)
LDL Cholesterol: 65 mg/dL (ref 0–99)
TRIGLYCERIDES: 89 mg/dL (ref <150)
VLDL: 18 mg/dL (ref 0–40)

## 2014-08-15 ENCOUNTER — Ambulatory Visit (INDEPENDENT_AMBULATORY_CARE_PROVIDER_SITE_OTHER): Payer: Medicare Other | Admitting: Family Medicine

## 2014-08-15 VITALS — BP 120/76 | HR 51 | Temp 98.3°F | Resp 18 | Ht 71.5 in | Wt 231.0 lb

## 2014-08-15 DIAGNOSIS — M79661 Pain in right lower leg: Secondary | ICD-10-CM

## 2014-08-15 DIAGNOSIS — E785 Hyperlipidemia, unspecified: Secondary | ICD-10-CM

## 2014-08-15 DIAGNOSIS — M609 Myositis, unspecified: Secondary | ICD-10-CM

## 2014-08-15 DIAGNOSIS — IMO0001 Reserved for inherently not codable concepts without codable children: Secondary | ICD-10-CM

## 2014-08-15 DIAGNOSIS — M791 Myalgia: Secondary | ICD-10-CM

## 2014-08-15 LAB — CK: Total CK: 322 U/L — ABNORMAL HIGH (ref 7–232)

## 2014-08-15 LAB — D-DIMER, QUANTITATIVE (NOT AT ARMC): D-Dimer, Quant: 0.35 ug/mL-FEU (ref 0.00–0.48)

## 2014-08-15 NOTE — Patient Instructions (Signed)
Your pain appears to be due to tibialis anterior muscle. This may be from recent walk on beach or other change in typical activity, and should improve with time.  We will check muscle test and blood clot test, but suspect these will be normal. You can cut the statin dose in half for now, but once pain improves for at least 1 week can try to return to previous dosage. Return to the clinic or go to the nearest emergency room if any of your symptoms worsen or new symptoms occur.

## 2014-08-15 NOTE — Progress Notes (Addendum)
Subjective:   This chart was scribed for Donald Ray, MD  by Donald [name], [facility] Scribe. This patient was seen in room 13 and the patient's care was started 12:02 PM.    Patient ID: Donald [name], male    DOB: Apr 10, 1941, 73 y.o.   MRN: [id number]  Chief Complaint  Patient presents with  . Medication Problem    joint pains due to statin medications    HPI  HPI Comments: Donald [name] is a 73 y.o. male with a PMHx of BPH and hyperlipidemia who presents to [facility] complaining of constant, mild R sided calf pain today.  Pt was last seen 9/14. Has been taking simvastatin 20 mg for some time. Last lipid panel controlled with LDL 65 at visit in September. CMP normal at that time. Also saw pt 11/2012 with some myalgias at that time on same dose of simvastatin. Had CK level drawn which was normal at 206. Degenerative disk of neck thought to be caused by symptoms at that time.  Pt reports new onset R calf pain x 1 week to 10 days. Pt is able to ambulate without any pain. Pain exacerbated when driving and alleviated when putting weight on the R leg. Pt noted a small abrasion just above the ankle which appeared about 10 days ago.  He denies any SOB, CP, or fever. Pt admits to a 4 hour drive over Thanksgiving break, admits to a lot of walking during visit as well. No recent surgeries. He denies any SOB, CP, fever, chills, palpitations.   Patient Active Problem List   Diagnosis Date Noted  . Radicular pain 09/18/2013  . Hyperlipidemia 10/20/2011  . Insomnia 10/20/2011  . Prostate cancer 10/20/2011  . Personal history of colonic polyps 12/16/2010   Past Medical History  Diagnosis Date  . BPH (benign prostatic hyperplasia)   . Adenomatous polyps 10/2002, 06/2009  . Arthritis   . Prostate cancer   . Hyperlipemia    Past Surgical History  Procedure Laterality Date  . Cystoscopy  04/2010  . Retropubic prostatectomy  09/2003  . Hip  arthroplasty  04/2010    Left - Dr. Mayer [name], right 2012  . Colonoscopy w/ polypectomy  11/04/2002    Sigmoid adenomas x2, 1 cm maximum  . Esophagogastroduodenoscopy  11/04/2002    Duodenitis, gastritis without H. pylori   . Colonoscopy w/ polypectomy  06/08/2009    6 polyps, largest 8 mm, at least 3 adenomas  . Joint replacement    . Prostate surgery    . Tonsillectomy    . Anterior cervical decomp/discectomy fusion N/A 09/18/2013    Procedure: Anterior cervical decompression fusion, cervical 4-5, cervical 5-6, cervical 6-7 with instrumentation and allograft;  Surgeon: Donald Ship, MD;  Location: [place];  Service: Orthopedics;  Laterality: N/A;  Anterior cervical decompression fusion, cervical 4-5, cervical 5-6, cervical 6-7 with instrumentation and allograft  . Colonoscopy    . Upper gastrointestinal endoscopy     Allergies  Allergen Reactions  . Iodine Anaphylaxis  . Ivp Dye [Iodinated Diagnostic Agents] Anaphylaxis   Prior to Admission medications   Medication Sig Start Date End Date Taking? Authorizing Provider  aspirin 81 MG chewable tablet Chew by mouth daily.   Yes Historical Provider, MD  Fish Oil OIL by Does not apply route. 1 teaspoon daily   Yes Historical Provider, MD  Multiple Vitamin (MULTIVITAMIN) capsule Take 1 capsule by mouth daily.     Yes  Historical Provider, MD  Multiple Vitamins-Minerals (VISION VITAMINS PO) Take by mouth.     Yes Historical Provider, MD  Na Sulfate-K Sulfate-Mg Sulf (SUPREP BOWEL PREP) SOLN Take 1 kit by mouth once. suprep as directed. No substitutions 10/17/13  Yes Donald Mayer, MD  simvastatin (ZOCOR) 20 MG tablet Take 1 tablet (20 mg total) by mouth daily. 05/25/14  Yes Donald Agreste, MD  zolpidem (AMBIEN) 10 MG tablet Take 0.5 tablets (5 mg total) by mouth at bedtime as needed for sleep. 02/05/14  Yes Donald Agreste, MD   History   Social History  . Marital Status: Married    Spouse Name: N/A    Number of Children: N/A  . Years of  Education: N/A   Occupational History  . Retired     [name]   Social History Main Topics  . Smoking status: Former Smoker    Types: Cigars    Quit date: 09/12/1972  . Smokeless tobacco: Never Used  . Alcohol Use: 1.2 oz/week    2 Cans of beer per week  . Drug Use: No  . Sexual Activity: Yes     Comment: number of sex partners in the last months 1, current birth control method - no prostate   Other Topics Concern  . Not on file   Social History Narrative   Exercise do Tai Chi daily and walking       Review of Systems  Constitutional: Negative for fever and chills.  Respiratory: Negative for chest tightness and shortness of breath.   Cardiovascular: Negative for chest pain.  Musculoskeletal: Positive for arthralgias.  Neurological: Negative for weakness and numbness.     Objective:  Physical Exam  Constitutional: He is oriented to person, place, and time. He appears well-developed and well-nourished.  HENT:  Head: Normocephalic.  Eyes: EOM are normal.  Neck: Normal range of motion.  Pulmonary/Chest: Effort normal.  Abdominal: He exhibits no distension.  Musculoskeletal: Normal range of motion.  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  Skin:  There is a small  7-8 mm healing abrasion to distal lateral lower leg without surrounding erythema or warmth  Psychiatric: He has a normal mood and affect.  Nursing note and vitals reviewed.     Filed Vitals:   08/15/14 1023  BP: 120/76  Pulse: 51  Temp: 98.3 F (36.8 C)  TempSrc: Oral  Resp: 18  Height: 5' 11.5" (1.816 m)  Weight: 231 lb (104.781 kg)  SpO2: 97%     Assessment & Plan:   [name] is a 73 y.o. male Right calf pain - Plan:  D-dimer, quantitative, CK,myalgia. - Plan: D-dimer, quantitative, CK  - on statin, but doubt related. Will check CK, and Ddimer, but also less likely DVT. Suspected tib anterior tendonitis/overuse recently. Sx care and rtc if persists. OK to temporarily hold statin or half dose, then restart once sx's resolve to see if associated with current myalgias.   Hyperlipidemia - Plan: D-dimer, quantitative, CK  -as above.  May try to hold statin temporarily.   Meds ordered this encounter  Medications  . Fish Oil OIL    Sig: by Does not apply route. 1 teaspoon daily   Patient Instructions  Your pain appears to be due to tibialis anterior muscle. This may be from recent walk on beach or other change in typical activity, and should improve with time.  We will check muscle test and blood clot test, but suspect these will be normal. You can cut the statin dose in half for now, but once pain improves for at least 1 week can try to return to previous dosage. Return to the clinic or go to the nearest emergency room if any of your symptoms worsen or new symptoms occur.   I personally performed the services described in this documentation, which was scribed in my presence. The recorded information has been reviewed and considered, and addended by me as needed.

## 2014-08-19 ENCOUNTER — Other Ambulatory Visit: Payer: Self-pay | Admitting: Family Medicine

## 2014-08-19 DIAGNOSIS — R748 Abnormal levels of other serum enzymes: Secondary | ICD-10-CM

## 2014-08-28 ENCOUNTER — Encounter: Payer: Self-pay | Admitting: Family Medicine

## 2014-08-29 ENCOUNTER — Other Ambulatory Visit (INDEPENDENT_AMBULATORY_CARE_PROVIDER_SITE_OTHER): Payer: Medicare Other | Admitting: *Deleted

## 2014-08-29 DIAGNOSIS — R748 Abnormal levels of other serum enzymes: Secondary | ICD-10-CM

## 2014-08-29 LAB — CK: Total CK: 290 U/L — ABNORMAL HIGH (ref 7–232)

## 2014-09-12 ENCOUNTER — Ambulatory Visit (INDEPENDENT_AMBULATORY_CARE_PROVIDER_SITE_OTHER): Payer: Medicare Other | Admitting: Family Medicine

## 2014-09-12 VITALS — BP 110/64 | HR 63 | Temp 98.2°F | Resp 16 | Ht 72.0 in | Wt 226.2 lb

## 2014-09-12 DIAGNOSIS — R062 Wheezing: Secondary | ICD-10-CM

## 2014-09-12 DIAGNOSIS — J22 Unspecified acute lower respiratory infection: Secondary | ICD-10-CM

## 2014-09-12 DIAGNOSIS — E785 Hyperlipidemia, unspecified: Secondary | ICD-10-CM

## 2014-09-12 DIAGNOSIS — J988 Other specified respiratory disorders: Secondary | ICD-10-CM

## 2014-09-12 DIAGNOSIS — R748 Abnormal levels of other serum enzymes: Secondary | ICD-10-CM

## 2014-09-12 MED ORDER — AZITHROMYCIN 250 MG PO TABS: ORAL_TABLET | ORAL | Status: DC

## 2014-09-12 MED ORDER — ALBUTEROL SULFATE HFA 108 (90 BASE) MCG/ACT IN AERS: 1.0000 | INHALATION_SPRAY | RESPIRATORY_TRACT | Status: DC | PRN

## 2014-09-12 NOTE — Progress Notes (Signed)
Subjective:    Patient ID: Donald [name], [name]    DOB: 1941-02-07, 74 y.o.   MRN: [id number] This chart was scribed for Donald Ray, [name] by Donald [name], Medical Scribe. This patient was seen in Room 9 and the patient's care was started a 11:11 AM.  HPI HPI Comments: Donald [name] is a 74 y.o. [name] with a hx of prostate cancer, HLD, and insomnia who presents to [facility] complaining of mildly productive worsening persistent cough that started two weeks ago. Pt also endorses associated SOB, wheezing, severe HA (two events), sore throat, and subjective fever. Pt denies sinus congestion. Pt states he has been using throat lozenges with minimal relief. Pt states he normally sleeps on his left side, but has not been able to due to a feeling of fluid in his chest. Pt states he has used mucinex in the past with negative side affects, and does not want to use it again.  Elevated CK enzyme. Pt states he is taking one simvastatin every other day. CK test showed enzyme 322 on December 5th, rechecked two weeks later, down to 290 on every other day dosing. Pt denies ongoing muscle cramping.    Patient Active Problem List   Diagnosis Date Noted  . Radicular pain 09/18/2013  . Hyperlipidemia 10/20/2011  . Insomnia 10/20/2011  . Prostate cancer 10/20/2011  . Personal history of colonic polyps 12/16/2010   Past Medical History  Diagnosis Date  . BPH (benign prostatic hyperplasia)   . Adenomatous polyps 10/2002, 06/2009  . Arthritis   . Prostate cancer   . Hyperlipemia    Past Surgical History  Procedure Laterality Date  . Cystoscopy  04/2010  . Retropubic prostatectomy  09/2003  . Hip arthroplasty  04/2010    Left - Dr. Mayer [name], right 2012  . Colonoscopy w/ polypectomy  11/04/2002    Sigmoid adenomas x2, 1 cm maximum  . Esophagogastroduodenoscopy  11/04/2002    Duodenitis, gastritis without H. pylori   . Colonoscopy w/ polypectomy  06/08/2009    6 polyps, largest 8 mm, at least 3 adenomas  .  Joint replacement    . Prostate surgery    . Tonsillectomy    . Anterior cervical decomp/discectomy fusion N/A 09/18/2013    Procedure: Anterior cervical decompression fusion, cervical 4-5, cervical 5-6, cervical 6-7 with instrumentation and allograft;  Surgeon: Donald [name];  Location: [place];  Service: Orthopedics;  Laterality: N/A;  Anterior cervical decompression fusion, cervical 4-5, cervical 5-6, cervical 6-7 with instrumentation and allograft  . Colonoscopy    . Upper gastrointestinal endoscopy     Allergies  Allergen Reactions  . Iodine Anaphylaxis  . Ivp Dye [Iodinated Diagnostic Agents] Anaphylaxis   Prior to Admission medications   Medication Sig Start Date End Date Taking? Authorizing Provider  aspirin 81 MG chewable tablet Chew by mouth daily.    Historical Provider, [name]  Fish Oil OIL by Does not apply route. 1 teaspoon daily    Historical Provider, [name]  Multiple Vitamin (MULTIVITAMIN) capsule Take 1 capsule by mouth daily.      Historical Provider, [name]  Multiple Vitamins-Minerals (VISION VITAMINS PO) Take by mouth.      Historical Provider, [name]  Na Sulfate-K Sulfate-Mg Sulf (SUPREP BOWEL PREP) SOLN Take 1 kit by mouth once. suprep as directed. No substitutions 10/17/13   Donald Mayer, [name]  simvastatin (ZOCOR) 20 MG tablet Take 1 tablet (20 mg total) by mouth daily. 05/25/14   Donald Agreste, [name]  zolpidem (AMBIEN) 10 MG tablet Take 0.5 tablets (5 mg total) by mouth at bedtime as needed for sleep. 02/05/14   Donald Agreste, [name]   History   Social History  . Marital Status: Married    Spouse Name: N/A    Number of Children: N/A  . Years of Education: N/A   Occupational History  . Retired     [name]   Social History Main Topics  . Smoking status: Former Smoker    Types: Cigars    Quit date: 09/12/1972  . Smokeless tobacco: Never Used  . Alcohol Use: 1.2 oz/week    2 Cans of beer per week  . Drug Use: No  . Sexual Activity: Yes     Comment: number of  sex partners in the last months 1, current birth control method - no prostate   Other Topics Concern  . Not on file   Social History Narrative   Exercise do Tai Chi daily and walking       Review of Systems  Constitutional: Positive for fever.  HENT: Positive for sore throat. Negative for congestion and sinus pressure.   Respiratory: Positive for cough, shortness of breath (Slightly) and wheezing.   Musculoskeletal: Negative for myalgias.       Objective:   Physical Exam  Constitutional: He is oriented to person, place, and time. He appears well-developed and well-nourished.  HENT:  Head: Normocephalic and atraumatic.  Right Ear: Tympanic membrane, external ear and ear canal normal.  Left Ear: Tympanic membrane, external ear and ear canal normal.  Nose: No rhinorrhea.  Mouth/Throat: Oropharynx is clear and moist and mucous membranes are normal. No oropharyngeal exudate or posterior oropharyngeal erythema.  Eyes: Conjunctivae are normal. Pupils are equal, round, and reactive to light.  Neck: Neck supple.  Cardiovascular: Normal rate, regular rhythm, normal heart sounds and intact distal pulses.   No murmur heard. Pulmonary/Chest: Effort normal and breath sounds normal. No respiratory distress. He has no wheezes. He has no rhonchi. He has no rales.  Diffuse scattered wheeze and a few course breath sounds, on expiration.  Abdominal: Soft. There is no tenderness.  Lymphadenopathy:    He has no cervical adenopathy.  Neurological: He is alert and oriented to person, place, and time.  Skin: Skin is warm and dry. No rash noted.  Psychiatric: He has a normal mood and affect. His behavior is normal.  Nursing note and vitals reviewed.     Filed Vitals:   09/12/14 1042  BP: 110/64  Pulse: 63  Temp: 98.2 F (36.8 C)  TempSrc: Oral  Resp: 16  Height: 6' (1.829 m)  Weight: 226 lb 3.2 oz (102.604 kg)  SpO2: 94%       Assessment & Plan:   Donald [name] is a 74 y.o.  [name] Hyperlipidemia - Plan: Lipid panel, with Elevated CK - Plan: CK  -improved on lower dose of zocor and myalgia resolved. Possible tib anterior tendonitis at that time.   -cont QOD dosing of zocor, recehck CK and lipids in 6 weeks, then follow up in few months.   Lower resp. tract infection - Plan: azithromycin (ZITHROMAX) 250 MG tablet, Wheezing - Plan: albuterol (PROVENTIL HFA;VENTOLIN HFA) 108 (90 BASE) MCG/ACT inhaler  -bronchitis vs early CAP. Bronchospasm.   -start zpak, albuterol if needed and RTC precautions as below.    Meds ordered this encounter  Medications  . azithromycin (ZITHROMAX) 250 MG tablet    Sig: Take 2 pills by mouth on day  1, then 1 pill by mouth per day on days 2 through 5.    Dispense:  6 tablet    Refill:  0  . albuterol (PROVENTIL HFA;VENTOLIN HFA) 108 (90 BASE) MCG/ACT inhaler    Sig: Inhale 1-2 puffs into the lungs every 4 (four) hours as needed for wheezing or shortness of breath.    Dispense:  1 Inhaler    Refill:  0   Patient Instructions  Start Zpak for bronchitis. Albuterol inhaler if needed for wheezing. If you need this medicine more than 3-4 times per day or persistently need this more than 3-4 days, return to recheck as may need prednisone or xray at that time. Return to the clinic or go to the nearest emergency room if any of your symptoms worsen or new symptoms occur.  Fasting lab visit in 6 weeks. If any new muscle aches - can stop simvastatin.     I personally performed the services described in this documentation, which was scribed in my presence. The recorded information has been reviewed and considered, and addended by me as needed.

## 2014-09-12 NOTE — Patient Instructions (Signed)
Start Zpak for bronchitis. Albuterol inhaler if needed for wheezing. If you need this medicine more than 3-4 times per day or persistently need this more than 3-4 days, return to recheck as may need prednisone or xray at that time. Return to the clinic or go to the nearest emergency room if any of your symptoms worsen or new symptoms occur.  Fasting lab visit in 6 weeks. If any new muscle aches - can stop simvastatin.

## 2014-09-22 ENCOUNTER — Ambulatory Visit (INDEPENDENT_AMBULATORY_CARE_PROVIDER_SITE_OTHER): Payer: Medicare Other

## 2014-09-22 ENCOUNTER — Ambulatory Visit (INDEPENDENT_AMBULATORY_CARE_PROVIDER_SITE_OTHER): Payer: Medicare Other | Admitting: Internal Medicine

## 2014-09-22 VITALS — BP 118/68 | HR 60 | Temp 98.6°F | Resp 16 | Ht 73.0 in | Wt 228.0 lb

## 2014-09-22 DIAGNOSIS — R059 Cough, unspecified: Secondary | ICD-10-CM

## 2014-09-22 DIAGNOSIS — G9331 Postviral fatigue syndrome: Secondary | ICD-10-CM

## 2014-09-22 DIAGNOSIS — R05 Cough: Secondary | ICD-10-CM

## 2014-09-22 DIAGNOSIS — R0602 Shortness of breath: Secondary | ICD-10-CM

## 2014-09-22 DIAGNOSIS — G933 Postviral fatigue syndrome: Secondary | ICD-10-CM

## 2014-09-22 LAB — POCT CBC
Granulocyte percent: 67.5 %G (ref 37–80)
HEMATOCRIT: 42.8 % — AB (ref 43.5–53.7)
Hemoglobin: 13.8 g/dL — AB (ref 14.1–18.1)
Lymph, poc: 1.6 (ref 0.6–3.4)
MCH: 29.96 pg (ref 27–31.2)
MCHC: 32.3 g/dL (ref 31.8–35.4)
MCV: 91.6 fL (ref 80–97)
MID (cbc): 0.5 (ref 0–0.9)
PLATELET COUNT, POC: 6 10*3/uL — AB (ref 142–424)
POC GRANULOCYTE: 4.3 (ref 2–6.9)
POC LYMPH %: 25.2 % (ref 10–50)
POC MID %: 7.3 %M (ref 0–12)
RBC: 4.68 M/uL — AB (ref 4.69–6.13)
RDW, POC: 301 %
WBC: 6.3 10*3/uL (ref 4.6–10.2)

## 2014-09-22 MED ORDER — HYDROCODONE-ACETAMINOPHEN 7.5-325 MG/15ML PO SOLN: 5.0000 mL | Freq: Four times a day (QID) | ORAL | Status: DC | PRN

## 2014-09-22 MED ORDER — IPRATROPIUM BROMIDE 0.02 % IN SOLN
0.5000 mg | Freq: Once | RESPIRATORY_TRACT | Status: AC
  Administered 2014-09-22: 0.5 mg via RESPIRATORY_TRACT

## 2014-09-22 MED ORDER — METHYLPREDNISOLONE ACETATE 80 MG/ML IJ SUSP
120.0000 mg | Freq: Once | INTRAMUSCULAR | Status: AC
  Administered 2014-09-22: 120 mg via INTRAMUSCULAR

## 2014-09-22 MED ORDER — ALBUTEROL SULFATE (2.5 MG/3ML) 0.083% IN NEBU
2.5000 mg | INHALATION_SOLUTION | Freq: Once | RESPIRATORY_TRACT | Status: AC
  Administered 2014-09-22: 2.5 mg via RESPIRATORY_TRACT

## 2014-09-22 MED ORDER — AZITHROMYCIN 250 MG PO TABS: ORAL_TABLET | ORAL | Status: DC

## 2014-09-22 NOTE — Patient Instructions (Addendum)
Cough, Adult   A cough is a reflex that helps clear your throat and airways. It can help heal the body or may be a reaction to an irritated airway. A cough may only last 2 or 3 weeks (acute) or may last more than 8 weeks (chronic).   CAUSES  Acute cough:   Viral or bacterial infections.  Chronic cough:   Infections.   Allergies.   Asthma.   Post-nasal drip.   Smoking.   Heartburn or acid reflux.   Some medicines.   Chronic lung problems (COPD).   Cancer.  SYMPTOMS    Cough.   Fever.   Chest pain.   Increased breathing rate.   High-pitched whistling sound when breathing (wheezing).   Colored mucus that you cough up (sputum).  TREATMENT    A bacterial cough may be treated with antibiotic medicine.   A viral cough must run its course and will not respond to antibiotics.   Your caregiver may recommend other treatments if you have a chronic cough.  HOME CARE INSTRUCTIONS    Only take over-the-counter or prescription medicines for pain, discomfort, or fever as directed by your caregiver. Use cough suppressants only as directed by your caregiver.   Use a cold steam vaporizer or humidifier in your bedroom or home to help loosen secretions.   Sleep in a semi-upright position if your cough is worse at night.   Rest as needed.   Stop smoking if you smoke.  SEEK IMMEDIATE MEDICAL CARE IF:    You have pus in your sputum.   Your cough starts to worsen.   You cannot control your cough with suppressants and are losing sleep.   You begin coughing up blood.   You have difficulty breathing.   You develop pain which is getting worse or is uncontrolled with medicine.   You have a fever.  MAKE SURE YOU:    Understand these instructions.   Will watch your condition.   Will get help right away if you are not doing well or get worse.  Document Released: 02/24/2011 Document Revised: 11/20/2011 Document Reviewed: 02/24/2011  [employer] Patient Information 2015 [employer]. This information is not intended  to replace advice given to you by your health care provider. Make sure you discuss any questions you have with your health care provider.    Bronchospasm  A bronchospasm is a spasm or tightening of the airways going into the lungs. During a bronchospasm breathing becomes more difficult because the airways get smaller. When this happens there can be coughing, a whistling sound when breathing (wheezing), and difficulty breathing. Bronchospasm is often associated with asthma, but not all patients who experience a bronchospasm have asthma.  CAUSES   A bronchospasm is caused by inflammation or irritation of the airways. The inflammation or irritation may be triggered by:    Allergies (such as to animals, pollen, food, or mold). Allergens that cause bronchospasm may cause wheezing immediately after exposure or many hours later.    Infection. Viral infections are believed to be the most common cause of bronchospasm.    Exercise.    Irritants (such as pollution, cigarette smoke, strong odors, aerosol sprays, and paint fumes).    Weather changes. Winds increase molds and pollens in the air. Rain refreshes the air by washing irritants out. Cold air may cause inflammation.    Stress and emotional upset.   SIGNS AND SYMPTOMS    Wheezing.    Excessive nighttime coughing.    Frequent   or severe coughing with a simple cold.    Chest tightness.    Shortness of breath.   DIAGNOSIS   Bronchospasm is usually diagnosed through a history and physical exam. Tests, such as chest X-rays, are sometimes done to look for other conditions.  TREATMENT    Inhaled medicines can be given to open up your airways and help you breathe. The medicines can be given using either an inhaler or a nebulizer machine.   Corticosteroid medicines may be given for severe bronchospasm, usually when it is associated with asthma.  HOME CARE INSTRUCTIONS    Always have a plan prepared for seeking medical care. Know when to call your health  care provider and local emergency services (911 in the [place] Know where you can access local emergency care.   Only take medicines as directed by your health care provider.   If you were prescribed an inhaler or nebulizer machine, ask your health care provider to explain how to use it correctly. Always use a spacer with your inhaler if you were given one.   It is necessary to remain calm during an attack. Try to relax and breathe more slowly.   Control your home environment in the following ways:    Change your heating and air conditioning filter at least once a month.    Limit your use of fireplaces and wood stoves.   Do not smoke and do not allow smoking in your home.    Avoid exposure to perfumes and fragrances.    Get rid of pests (such as roaches and mice) and their droppings.    Throw away plants if you see mold on them.    Keep your house clean and dust free.    Replace carpet with wood, tile, or vinyl flooring. Carpet can trap dander and dust.    Use allergy-proof pillows, mattress covers, and box spring covers.    Wash bed sheets and blankets every week in hot water and dry them in a dryer.    Use blankets that are made of polyester or cotton.    Wash hands frequently.  SEEK MEDICAL CARE IF:    You have muscle aches.    You have chest pain.    The sputum changes from clear or white to yellow, green, gray, or bloody.    The sputum you cough up gets thicker.    There are problems that may be related to the medicine you are given, such as a rash, itching, swelling, or trouble breathing.   SEEK IMMEDIATE MEDICAL CARE IF:    You have worsening wheezing and coughing even after taking your prescribed medicines.    You have increased difficulty breathing.    You develop severe chest pain.  MAKE SURE YOU:    Understand these instructions.   Will watch your condition.   Will get help right away if you are not doing well or get worse.  Document Released: 08/31/2003  Document Revised: 09/02/2013 Document Reviewed: 02/17/2013  [employer] Patient Information 2015 [employer]. This information is not intended to replace advice given to you by your health care provider. Make sure you discuss any questions you have with your health care provider.

## 2014-09-22 NOTE — Progress Notes (Signed)
   Subjective:    Patient ID: Donald [name], male    DOB: August 23, 1941, 74 y.o.   MRN: [id number]  HPI Has 3 week persistent cough, fatigue, weight loss, minimally productive, no hemoptysis. Constant annoying cough worse problem. No fever,chest pain. Does have sob.and uses his albuterol. Dr. Carlota Raspberry hi primary.Had his flu shot.   Review of Systems     Objective:   Physical Exam  Constitutional: He is oriented to person, place, and time. He appears well-developed and well-nourished. No distress.  HENT:  Head: Normocephalic.  Right Ear: External ear normal.  Left Ear: External ear normal.  Nose: Nose normal.  Mouth/Throat: Oropharynx is clear and moist.  Eyes: Conjunctivae and EOM are normal. Pupils are equal, round, and reactive to light. No scleral icterus.  Neck: Normal range of motion.  Cardiovascular: Normal rate, regular rhythm, normal heart sounds and intact distal pulses.   Pulmonary/Chest: Effort normal. No tachypnea. He has decreased breath sounds. He has no wheezes. He has rhonchi. He has no rales.  Lymphadenopathy:    He has no cervical adenopathy.  Neurological: He is alert and oriented to person, place, and time. Coordination normal.  Psychiatric: He has a normal mood and affect. His behavior is normal. Judgment and thought content normal.   [facility] reading (PRIMARY) by  Dr Elder Cyphers no acute cardio-pulmonary chang, stable cxr compared  Nebulizer  Results for orders placed or performed in visit on 09/22/14  POCT CBC  Result Value Ref Range   WBC 6.3 4.6 - 10.2 K/uL   Lymph, poc 1.6 0.6 - 3.4   POC LYMPH PERCENT 25.2 10 - 50 %L   MID (cbc) 0.5 0 - 0.9   POC MID % 7.3 0 - 12 %M   POC Granulocyte 4.3 2 - 6.9   Granulocyte percent 67.5 37 - 80 %G   RBC 4.68 (A) 4.69 - 6.13 M/uL   Hemoglobin 13.8 (A) 14.1 - 18.1 g/dL   HCT, POC 42.8 (A) 43.5 - 53.7 %   MCV 91.6 80 - 97 fL   MCH, POC 29.96 27 - 31.2 pg   MCHC 32.3 31.8 - 35.4 g/dL   RDW, POC 301 %   Platelet Count,  POC 6.0 (A) 142 - 424 K/uL   MPV  0 - 99.8 fL    .         Assessment & Plan:  Persistent cough/Bronchospasm Lortabelixir/Azithromycin/Depomedrol

## 2014-10-23 ENCOUNTER — Other Ambulatory Visit (INDEPENDENT_AMBULATORY_CARE_PROVIDER_SITE_OTHER): Payer: Medicare Other | Admitting: Family Medicine

## 2014-10-23 DIAGNOSIS — R748 Abnormal levels of other serum enzymes: Secondary | ICD-10-CM

## 2014-10-23 DIAGNOSIS — E785 Hyperlipidemia, unspecified: Secondary | ICD-10-CM

## 2014-10-23 LAB — CK: Total CK: 254 U/L — ABNORMAL HIGH (ref 7–232)

## 2014-10-23 LAB — LIPID PANEL
Cholesterol: 152 mg/dL (ref 0–200)
HDL: 45 mg/dL (ref >39)
LDL CALC: 88 mg/dL (ref 0–99)
Total CHOL/HDL Ratio: 3.4 Ratio
Triglycerides: 96 mg/dL (ref <150)
VLDL: 19 mg/dL (ref 0–40)

## 2014-10-29 ENCOUNTER — Other Ambulatory Visit: Payer: Self-pay

## 2014-10-29 DIAGNOSIS — G4700 Insomnia, unspecified: Secondary | ICD-10-CM

## 2014-10-29 NOTE — Telephone Encounter (Signed)
Pharm faxed req for RFs of zolpidem. Pended.

## 2014-10-31 MED ORDER — ZOLPIDEM TARTRATE 10 MG PO TABS: 5.0000 mg | ORAL_TABLET | Freq: Every evening | ORAL | Status: DC | PRN

## 2014-10-31 NOTE — Telephone Encounter (Signed)
Stable when last discussed. Refilled, and can discuss further next ov.

## 2014-11-02 NOTE — Telephone Encounter (Signed)
Called in.

## 2014-11-05 ENCOUNTER — Encounter: Payer: Self-pay | Admitting: Family Medicine

## 2014-11-17 ENCOUNTER — Ambulatory Visit (INDEPENDENT_AMBULATORY_CARE_PROVIDER_SITE_OTHER): Payer: Medicare Other | Admitting: Family Medicine

## 2014-11-17 ENCOUNTER — Ambulatory Visit (INDEPENDENT_AMBULATORY_CARE_PROVIDER_SITE_OTHER): Payer: Medicare Other

## 2014-11-17 VITALS — BP 104/68 | HR 50 | Temp 98.1°F | Resp 18 | Ht 73.0 in | Wt 228.0 lb

## 2014-11-17 DIAGNOSIS — R05 Cough: Secondary | ICD-10-CM | POA: Diagnosis not present

## 2014-11-17 DIAGNOSIS — R0600 Dyspnea, unspecified: Secondary | ICD-10-CM | POA: Diagnosis not present

## 2014-11-17 DIAGNOSIS — R062 Wheezing: Secondary | ICD-10-CM | POA: Diagnosis not present

## 2014-11-17 DIAGNOSIS — R059 Cough, unspecified: Secondary | ICD-10-CM

## 2014-11-17 DIAGNOSIS — R0601 Orthopnea: Secondary | ICD-10-CM | POA: Diagnosis not present

## 2014-11-17 LAB — BRAIN NATRIURETIC PEPTIDE: Brain Natriuretic Peptide: 61 pg/mL (ref 0.0–100.0)

## 2014-11-17 MED ORDER — BENZONATATE 100 MG PO CAPS: 100.0000 mg | ORAL_CAPSULE | Freq: Three times a day (TID) | ORAL | Status: DC | PRN

## 2014-11-17 NOTE — Patient Instructions (Signed)
Start allegra once per dayu, zantac over the counter twice per day to treat other causes of cough (allergies, heartburn), avoid foods bleow that cna cause heartburn (that can trigger cough, especially when lying down). Tessalon if needed during the day, hycodan at night if needed. If wheezing - albuterol ok, but I do not hear wheezing today and your breathing test was normal. If cough not improving in next 2 weeks - I can refer you to pulmonary for evaluation of this cough. Return to the clinic or go to the nearest emergency room if any of your symptoms worsen or new symptoms occur.  Cough, Adult  A cough is a reflex that helps clear your throat and airways. It can help heal the body or may be a reaction to an irritated airway. A cough may only last 2 or 3 weeks (acute) or may last more than 8 weeks (chronic).  CAUSES Acute cough:  Viral or bacterial infections. Chronic cough:  Infections.  Allergies.  Asthma.  Post-nasal drip.  Smoking.  Heartburn or acid reflux.  Some medicines.  Chronic lung problems (COPD).  Cancer. SYMPTOMS   Cough.  Fever.  Chest pain.  Increased breathing rate.  High-pitched whistling sound when breathing (wheezing).  Colored mucus that you cough up (sputum). TREATMENT   A bacterial cough may be treated with antibiotic medicine.  A viral cough must run its course and will not respond to antibiotics.  Your caregiver may recommend other treatments if you have a chronic cough. HOME CARE INSTRUCTIONS   Only take over-the-counter or prescription medicines for pain, discomfort, or fever as directed by your caregiver. Use cough suppressants only as directed by your caregiver.  Use a cold steam vaporizer or humidifier in your bedroom or home to help loosen secretions.  Sleep in a semi-upright position if your cough is worse at night.  Rest as needed.  Stop smoking if you smoke. SEEK IMMEDIATE MEDICAL CARE IF:   You have pus in your  sputum.  Your cough starts to worsen.  You cannot control your cough with suppressants and are losing sleep.  You begin coughing up blood.  You have difficulty breathing.  You develop pain which is getting worse or is uncontrolled with medicine.  You have a fever. MAKE SURE YOU:   Understand these instructions.  Will watch your condition.  Will get help right away if you are not doing well or get worse. Document Released: 02/24/2011 Document Revised: 11/20/2011 Document Reviewed: 02/24/2011 [facility] Patient Information 2015 [place], [place]. This information is not intended to replace advice given to you by your health care provider. Make sure you discuss any questions you have with your health care provider.  Food Choices for Gastroesophageal Reflux Disease When you have gastroesophageal reflux disease (GERD), the foods you eat and your eating habits are very important. Choosing the right foods can help ease the discomfort of GERD. WHAT GENERAL GUIDELINES DO I NEED TO FOLLOW?  Choose fruits, vegetables, whole grains, low-fat dairy products, and low-fat meat, fish, and poultry.  Limit fats such as oils, salad dressings, butter, nuts, and avocado.  Keep a food diary to identify foods that cause symptoms.  Avoid foods that cause reflux. These may be different for different people.  Eat frequent small meals instead of three large meals each day.  Eat your meals slowly, in a relaxed setting.  Limit fried foods.  Cook foods using methods other than frying.  Avoid drinking alcohol.  Avoid drinking large amounts of liquids  with your meals.  Avoid bending over or lying down until 2-3 hours after eating. WHAT FOODS ARE NOT RECOMMENDED? The following are some foods and drinks that may worsen your symptoms: Vegetables Tomatoes. Tomato juice. Tomato and spaghetti sauce. Chili peppers. Onion and garlic. Horseradish. Fruits Oranges, grapefruit, and lemon (fruit and  juice). Meats High-fat meats, fish, and poultry. This includes hot dogs, ribs, ham, sausage, salami, and bacon. Dairy Whole milk and chocolate milk. Sour cream. Cream. Butter. Ice cream. Cream cheese.  Beverages Coffee and tea, with or without caffeine. Carbonated beverages or energy drinks. Condiments Hot sauce. Barbecue sauce.  Sweets/Desserts Chocolate and cocoa. Donuts. Peppermint and spearmint. Fats and Oils High-fat foods, including [place] fries and potato chips. Other Vinegar. Strong spices, such as black pepper, white pepper, red pepper, cayenne, curry powder, cloves, ginger, and chili powder. The items listed above may not be a complete list of foods and beverages to avoid. Contact your dietitian for more information. Document Released: 08/28/2005 Document Revised: 09/02/2013 Document Reviewed: 07/02/2013 [facility] Patient Information 2015 [place], [place]. This information is not intended to replace advice given to you by your health care provider. Make sure you discuss any questions you have with your health care provider.

## 2014-11-17 NOTE — Progress Notes (Addendum)
Subjective:  This chart was scribed for Donald Agreste, MD by Ladene Artist, ED Scribe. The patient was seen in room 10. Patient's care was started at 10:16 AM.   Patient ID: Donald [name], male    DOB: November 22, 1940, 74 y.o.   MRN: [id number]  Chief Complaint  Patient presents with  . Cough    has had a cough since christmas; states he can not get rid of it   HPI HPI Comments: Donald [name] is a 74 y.o. male, with a h/o hyperlipidemia, who presents to the [facility] complaining of persistent cough for the past 3 months. Pt was seen on 09/22/14 by Dr. Elder Cyphers for cough present for 3 weeks at that time. Pt had used albuterol a few times at that visit. He had a CXR that was negative for active cardiopulmonary disease. Pt was treated with nebulizer in office and treated with a shot of DepoMedrol, z-pak and Lortab Elixir. When treated by Dr. Elder Cyphers with z-pak, pt had just finished a z-pak 10 days earlier.   Today, pt reports that cough improved after nebulizer treatment on 09/22/14. Pt reports associated swelling in the front of his throat 2 nights ago, difficulty swallowing 2 days ago that has resolved, rhinorrhea 3-4 days ago, intermittent wheezing. He further reports noisy breathing in the middle of the night with associated SOB that is improved with sitting up. Pt denies fever, congestion, chest pain, chest tightness, heartburn, leg swelling. He has been treating with lozenges and albuterol inhaler at least x2-3 daily with minimal relief. No h/o SOB with exercise. No cardiac history.   PCP: Donald Agreste, MD  Patient Active Problem List   Diagnosis Date Noted  . Radicular pain 09/18/2013  . Hyperlipidemia 10/20/2011  . Insomnia 10/20/2011  . Prostate cancer 10/20/2011  . Personal history of colonic polyps 12/16/2010   Past Medical History  Diagnosis Date  . BPH (benign prostatic hyperplasia)   . Adenomatous polyps 10/2002, 06/2009  . Arthritis   . Prostate  cancer   . Hyperlipemia    Past Surgical History  Procedure Laterality Date  . Cystoscopy  04/2010  . Retropubic prostatectomy  09/2003  . Hip arthroplasty  04/2010    Left - Dr. Mayer Camel, right 2012  . Colonoscopy w/ polypectomy  11/04/2002    Sigmoid adenomas x2, 1 cm maximum  . Esophagogastroduodenoscopy  11/04/2002    Duodenitis, gastritis without H. pylori   . Colonoscopy w/ polypectomy  06/08/2009    6 polyps, largest 8 mm, at least 3 adenomas  . Joint replacement    . Prostate surgery    . Tonsillectomy    . Anterior cervical decomp/discectomy fusion N/A 09/18/2013    Procedure: Anterior cervical decompression fusion, cervical 4-5, cervical 5-6, cervical 6-7 with instrumentation and allograft;  Surgeon: Sinclair Ship, MD;  Location: [place];  Service: Orthopedics;  Laterality: N/A;  Anterior cervical decompression fusion, cervical 4-5, cervical 5-6, cervical 6-7 with instrumentation and allograft  . Colonoscopy    . Upper gastrointestinal endoscopy     Allergies  Allergen Reactions  . Iodine Anaphylaxis  . Ivp Dye [Iodinated Diagnostic Agents] Anaphylaxis   Prior to Admission medications   Medication Sig Start Date End Date Taking? Authorizing Provider  albuterol (PROVENTIL HFA;VENTOLIN HFA) 108 (90 BASE) MCG/ACT inhaler Inhale 1-2 puffs into the lungs every 4 (four) hours as needed for wheezing or shortness of breath. 09/12/14  Yes Donald Agreste, MD  aspirin 81 MG  chewable tablet Chew by mouth daily.   Yes Historical Provider, MD  Fish Oil OIL by Does not apply route. 1 teaspoon daily   Yes Historical Provider, MD  Multiple Vitamin (MULTIVITAMIN) capsule Take 1 capsule by mouth daily.     Yes Historical Provider, MD  Na Sulfate-K Sulfate-Mg Sulf (SUPREP BOWEL PREP) SOLN Take 1 kit by mouth once. suprep as directed. No substitutions 10/17/13  Yes Gatha Mayer, MD  simvastatin (ZOCOR) 20 MG tablet Take 1 tablet (20 mg total) by mouth daily. 05/25/14  Yes Donald Agreste, MD    zolpidem (AMBIEN) 10 MG tablet Take 0.5 tablets (5 mg total) by mouth at bedtime as needed for sleep. 10/31/14  Yes Donald Agreste, MD   History   Social History  . Marital Status: Married    Spouse Name: N/A  . Number of Children: N/A  . Years of Education: N/A   Occupational History  . Retired     [name]   Social History Main Topics  . Smoking status: Former Smoker    Types: Cigars    Quit date: 09/12/1972  . Smokeless tobacco: Never Used  . Alcohol Use: 1.2 oz/week    2 Cans of beer per week  . Drug Use: No  . Sexual Activity: Yes     Comment: number of sex partners in the last months 1, current birth control method - no prostate   Other Topics Concern  . Not on file   Social History Narrative   Exercise do Tai Chi daily and walking   Review of Systems  Constitutional: Negative for fever.  HENT: Positive for rhinorrhea. Negative for congestion.   Respiratory: Positive for cough, shortness of breath and wheezing. Negative for chest tightness.   Cardiovascular: Negative for chest pain and leg swelling.      Objective:   Physical Exam  Constitutional: He is oriented to person, place, and time. He appears well-developed and well-nourished. No distress.  HENT:  Head: Normocephalic and atraumatic.  Right Ear: Tympanic membrane, external ear and ear canal normal.  Left Ear: Tympanic membrane, external ear and ear canal normal.  Nose: No rhinorrhea.  Mouth/Throat: Oropharynx is clear and moist and mucous membranes are normal. No oropharyngeal exudate or posterior oropharyngeal erythema.  Eyes: Conjunctivae are normal. Pupils are equal, round, and reactive to light.  Neck: Neck supple.  Cardiovascular: Regular rhythm, normal heart sounds and intact distal pulses.  Bradycardia present.   No murmur heard. Heart sounds are distant. HR approximately 50.   Pulmonary/Chest: Effort normal and breath sounds normal. No stridor. He has no wheezes. He has no rhonchi. He  has no rales.  Positive orthopnea. Positive pnd.  Abdominal: Soft. There is no tenderness.  Musculoskeletal: He exhibits no edema.  Lymphadenopathy:    He has no cervical adenopathy.  Neurological: He is alert and oriented to person, place, and time.  Skin: Skin is warm and dry. No rash noted.  Psychiatric: He has a normal mood and affect. His behavior is normal.  Vitals reviewed.  Filed Vitals:   11/17/14 0929  BP: 104/68  Pulse: 50  Temp: 98.1 F (36.7 C)  TempSrc: Oral  Resp: 18  Height: 6' 1"  (1.854 m)  Weight: 228 lb (103.42 kg)  SpO2: 96%  EKG: Atrial bradycardia at rate of 52. PR 176. Incomplete RBBB. No acute findings otherwise.  [facility] reading (PRIMARY) by  Dr. Carlota Raspberry: CXR- no acute findings or apparent change from prior XR.  Peak flow  reading is 525, about 100 % of predicted.    Assessment & Plan:   HEYWARD DOUTHIT is a 74 y.o. male Cough - Plan: DG Chest 2 View, EKG 12-Lead, benzonatate (TESSALON) 100 MG capsule  Dyspnea - Plan: Brain natriuretic peptide, DG Chest 2 View, EKG 12-Lead  Wheezing - Plan: DG Chest 2 View, EKG 12-Lead  Orthopnea - Plan: EKG 12-Lead  Recurrent, persistent cough - reassuring peak flow, CXR, and EKG stable from prior. Mild dyspnea and nighttime symptoms - will check BNP, but doubt failure or cardiac source.  DDX includes recent URI woth PND, allergic rhinitis with PND, or LPR. No stridor, LAD or other concerns on exam on neck - rtc or ER if this recurs.   -trigger avoidance for GERD/LPR. Start zantac BID  -otc antihistamine QD  -tessalon perles.   -albuterol only if needed for wheezing/dyspnea.   -BNP pending.   -if not improving in 2 weeks - pulm or ENT eval. Sooner if worse.rtc/er precautions.   Meds ordered this encounter  Medications  . benzonatate (TESSALON) 100 MG capsule    Sig: Take 1 capsule (100 mg total) by mouth 3 (three) times daily as needed for cough.    Dispense:  30 capsule    Refill:  0   Patient Instructions    Start allegra once per dayu, zantac over the counter twice per day to treat other causes of cough (allergies, heartburn), avoid foods bleow that cna cause heartburn (that can trigger cough, especially when lying down). Tessalon if needed during the day, hycodan at night if needed. If wheezing - albuterol ok, but I do not hear wheezing today and your breathing test was normal. If cough not improving in next 2 weeks - I can refer you to pulmonary for evaluation of this cough. Return to the clinic or go to the nearest emergency room if any of your symptoms worsen or new symptoms occur.  Cough, Adult  A cough is a reflex that helps clear your throat and airways. It can help heal the body or may be a reaction to an irritated airway. A cough may only last 2 or 3 weeks (acute) or may last more than 8 weeks (chronic).  CAUSES Acute cough:  Viral or bacterial infections. Chronic cough:  Infections.  Allergies.  Asthma.  Post-nasal drip.  Smoking.  Heartburn or acid reflux.  Some medicines.  Chronic lung problems (COPD).  Cancer. SYMPTOMS   Cough.  Fever.  Chest pain.  Increased breathing rate.  High-pitched whistling sound when breathing (wheezing).  Colored mucus that you cough up (sputum). TREATMENT   A bacterial cough may be treated with antibiotic medicine.  A viral cough must run its course and will not respond to antibiotics.  Your caregiver may recommend other treatments if you have a chronic cough. HOME CARE INSTRUCTIONS   Only take over-the-counter or prescription medicines for pain, discomfort, or fever as directed by your caregiver. Use cough suppressants only as directed by your caregiver.  Use a cold steam vaporizer or humidifier in your bedroom or home to help loosen secretions.  Sleep in a semi-upright position if your cough is worse at night.  Rest as needed.  Stop smoking if you smoke. SEEK IMMEDIATE MEDICAL CARE IF:   You have pus in your  sputum.  Your cough starts to worsen.  You cannot control your cough with suppressants and are losing sleep.  You begin coughing up blood.  You have difficulty breathing.  You develop  pain which is getting worse or is uncontrolled with medicine.  You have a fever. MAKE SURE YOU:   Understand these instructions.  Will watch your condition.  Will get help right away if you are not doing well or get worse. Document Released: 02/24/2011 Document Revised: 11/20/2011 Document Reviewed: 02/24/2011 [facility] Patient Information 2015 [place], [place]. This information is not intended to replace advice given to you by your health care provider. Make sure you discuss any questions you have with your health care provider.  Food Choices for Gastroesophageal Reflux Disease When you have gastroesophageal reflux disease (GERD), the foods you eat and your eating habits are very important. Choosing the right foods can help ease the discomfort of GERD. WHAT GENERAL GUIDELINES DO I NEED TO FOLLOW?  Choose fruits, vegetables, whole grains, low-fat dairy products, and low-fat meat, fish, and poultry.  Limit fats such as oils, salad dressings, butter, nuts, and avocado.  Keep a food diary to identify foods that cause symptoms.  Avoid foods that cause reflux. These may be different for different people.  Eat frequent small meals instead of three large meals each day.  Eat your meals slowly, in a relaxed setting.  Limit fried foods.  Cook foods using methods other than frying.  Avoid drinking alcohol.  Avoid drinking large amounts of liquids with your meals.  Avoid bending over or lying down until 2-3 hours after eating. WHAT FOODS ARE NOT RECOMMENDED? The following are some foods and drinks that may worsen your symptoms: Vegetables Tomatoes. Tomato juice. Tomato and spaghetti sauce. Chili peppers. Onion and garlic. Horseradish. Fruits Oranges, grapefruit, and lemon (fruit and  juice). Meats High-fat meats, fish, and poultry. This includes hot dogs, ribs, ham, sausage, salami, and bacon. Dairy Whole milk and chocolate milk. Sour cream. Cream. Butter. Ice cream. Cream cheese.  Beverages Coffee and tea, with or without caffeine. Carbonated beverages or energy drinks. Condiments Hot sauce. Barbecue sauce.  Sweets/Desserts Chocolate and cocoa. Donuts. Peppermint and spearmint. Fats and Oils High-fat foods, including [place] fries and potato chips. Other Vinegar. Strong spices, such as black pepper, white pepper, red pepper, cayenne, curry powder, cloves, ginger, and chili powder. The items listed above may not be a complete list of foods and beverages to avoid. Contact your dietitian for more information. Document Released: 08/28/2005 Document Revised: 09/02/2013 Document Reviewed: 07/02/2013 [facility] Patient Information 2015 [place], [place]. This information is not intended to replace advice given to you by your health care provider. Make sure you discuss any questions you have with your health care provider.       I personally performed the services described in this documentation, which was scribed in my presence. The recorded information has been reviewed and considered, and addended by me as needed.

## 2014-12-14 ENCOUNTER — Ambulatory Visit (INDEPENDENT_AMBULATORY_CARE_PROVIDER_SITE_OTHER): Payer: Medicare Other | Admitting: Emergency Medicine

## 2014-12-14 ENCOUNTER — Ambulatory Visit (INDEPENDENT_AMBULATORY_CARE_PROVIDER_SITE_OTHER): Payer: Medicare Other

## 2014-12-14 VITALS — BP 124/70 | HR 67 | Temp 99.0°F | Resp 16 | Ht 71.5 in | Wt 231.5 lb

## 2014-12-14 DIAGNOSIS — J209 Acute bronchitis, unspecified: Secondary | ICD-10-CM | POA: Diagnosis not present

## 2014-12-14 DIAGNOSIS — R05 Cough: Secondary | ICD-10-CM

## 2014-12-14 DIAGNOSIS — R059 Cough, unspecified: Secondary | ICD-10-CM

## 2014-12-14 MED ORDER — LEVOFLOXACIN 500 MG PO TABS: 500.0000 mg | ORAL_TABLET | Freq: Every day | ORAL | Status: AC

## 2014-12-14 MED ORDER — HYDROCOD POLST-CHLORPHEN POLST 10-8 MG/5ML PO LQCR: 5.0000 mL | Freq: Two times a day (BID) | ORAL | Status: DC | PRN

## 2014-12-14 NOTE — Patient Instructions (Signed)

## 2014-12-14 NOTE — Progress Notes (Signed)
[facility] [address] [place] [address] [phone]  Date:  12/14/2014   Name:  Donald [name]   DOB:  01/25/1941   MRN:  [id number]  PCP:  [name], MD    Chief Complaint: Cough and Nasal Congestion   History of Present Illness:  Donald [name] is a 74 y.o. very pleasant male patient who presents with the following:  Persistent cough since before christmas Seen march 8 and had basilar atelectasis or infiltrate Still coughing with persistent mucopurulent sputum Has post nasal drainage.   Nasal congestion and purulent drainage No fever or chills Scant wheezing no shortness of breath Today developed a sharp anterior chest pain while coughing. Worse with deep breathing and coughing. No improvement with over the counter medications or other home remedies.  Denies other complaint or health concern today.   Patient Active Problem List   Diagnosis Date Noted  . Radicular pain 09/18/2013  . Hyperlipidemia 10/20/2011  . Insomnia 10/20/2011  . Prostate cancer 10/20/2011  . Personal history of colonic polyps 12/16/2010    Past Medical History  Diagnosis Date  . BPH (benign prostatic hyperplasia)   . Adenomatous polyps 10/2002, 06/2009  . Arthritis   . Prostate cancer   . Hyperlipemia     Past Surgical History  Procedure Laterality Date  . Cystoscopy  04/2010  . Retropubic prostatectomy  09/2003  . Hip arthroplasty  04/2010    Left - Dr. Mayer Camel, right 2012  . Colonoscopy w/ polypectomy  11/04/2002    Sigmoid adenomas x2, 1 cm maximum  . Esophagogastroduodenoscopy  11/04/2002    Duodenitis, gastritis without H. pylori   . Colonoscopy w/ polypectomy  06/08/2009    6 polyps, largest 8 mm, at least 3 adenomas  . Joint replacement    . Prostate surgery    . Tonsillectomy    . Anterior cervical decomp/discectomy fusion N/A 09/18/2013    Procedure: Anterior cervical decompression fusion, cervical 4-5, cervical 5-6, cervical 6-7 with  instrumentation and allograft;  Surgeon: Sinclair Ship, MD;  Location: [place];  Service: Orthopedics;  Laterality: N/A;  Anterior cervical decompression fusion, cervical 4-5, cervical 5-6, cervical 6-7 with instrumentation and allograft  . Colonoscopy    . Upper gastrointestinal endoscopy      History  Substance Use Topics  . Smoking status: Former Smoker    Types: Cigars    Quit date: 09/12/1972  . Smokeless tobacco: Never Used  . Alcohol Use: 1.2 oz/week    2 Cans of beer per week    Family History  Problem Relation Age of Onset  . Diabetes Mother   . Heart disease Mother   . Prostate cancer Father   . Colon cancer Neg Hx   . Stomach cancer Neg Hx   . Cancer Brother     prostate    Allergies  Allergen Reactions  . Iodine Anaphylaxis  . Ivp Dye [Iodinated Diagnostic Agents] Anaphylaxis    Medication list has been reviewed and updated.  Current Outpatient Prescriptions on File Prior to Visit  Medication Sig Dispense Refill  . Fish Oil OIL by Does not apply route. 1 teaspoon daily    . Multiple Vitamin (MULTIVITAMIN) capsule Take 1 capsule by mouth daily.      . simvastatin (ZOCOR) 20 MG tablet Take 1 tablet (20 mg total) by mouth daily. (Patient taking differently: Take 20 mg by mouth every other day. ) 90 tablet 1  . zolpidem (AMBIEN) 10  MG tablet Take 0.5 tablets (5 mg total) by mouth at bedtime as needed for sleep. 45 tablet 1  . albuterol (PROVENTIL HFA;VENTOLIN HFA) 108 (90 BASE) MCG/ACT inhaler Inhale 1-2 puffs into the lungs every 4 (four) hours as needed for wheezing or shortness of breath. (Patient not taking: Reported on 12/14/2014) 1 Inhaler 0  . benzonatate (TESSALON) 100 MG capsule Take 1 capsule (100 mg total) by mouth 3 (three) times daily as needed for cough. (Patient not taking: Reported on 12/14/2014) 30 capsule 0   No current facility-administered medications on file prior to visit.    Review of Systems:  As per HPI, otherwise negative.     Physical Examination: Filed Vitals:   12/14/14 1935  BP: 124/70  Pulse: 67  Temp: 99 F (37.2 C)  Resp: 16   Filed Vitals:   12/14/14 1935  Height: 5' 11.5" (1.816 m)  Weight: 231 lb 8 oz (105.008 kg)   Body mass index is 31.84 kg/(m^2). Ideal Body Weight: Weight in (lb) to have BMI = 25: 181.4  GEN: WDWN, NAD, Non-toxic, A & O x 3 HEENT: Atraumatic, Normocephalic. Neck supple. No masses, No LAD. Ears and Nose: No external deformity. CV: RRR, No M/G/R. No JVD. No thrill. No extra heart sounds. PULM: CTA B, no wheezes, crackles, rhonchi. No retractions. No resp. distress. No accessory muscle use. Chest tender parasternal mostly on right ABD: S, NT, ND, +BS. No rebound. No HSM. EXTR: No c/c/e NEURO Normal gait.  PSYCH: Normally interactive. Conversant. Not depressed or anxious appearing.  Calm demeanor.    Assessment and Plan: Bronchitis levaquin Signed,  Ellison Carwin, MD   [facility] reading (PRIMARY) by  Dr. Ouida Sills unchanged.Marland Kitchen

## 2015-02-26 ENCOUNTER — Encounter: Payer: Self-pay | Admitting: Family Medicine

## 2015-03-11 ENCOUNTER — Other Ambulatory Visit: Payer: Self-pay | Admitting: Family Medicine

## 2015-04-09 ENCOUNTER — Ambulatory Visit (INDEPENDENT_AMBULATORY_CARE_PROVIDER_SITE_OTHER): Payer: Medicare Other | Admitting: Family Medicine

## 2015-04-09 VITALS — BP 115/71 | HR 57 | Temp 99.1°F | Resp 16 | Ht 72.0 in | Wt 230.0 lb

## 2015-04-09 DIAGNOSIS — Z23 Encounter for immunization: Secondary | ICD-10-CM

## 2015-04-09 NOTE — Progress Notes (Signed)
[facility] [address] [place] [address] [phone]  Date:  04/09/2015   Name:  Donald [name]   DOB:  [birth year]   MRN:  [id number]  PCP:  [name], MD    Chief Complaint: Immunizations   History of Present Illness:  Donald [name] is a 74 y.o. very pleasant male patient who presents with the following:  Here today seeking pneumonia vaccine booster. He has pneumovax in 2013- he was told that he needed a booser.    Patient Active Problem List   Diagnosis Date Noted  . Radicular pain 09/18/2013  . Hyperlipidemia 10/20/2011  . Insomnia 10/20/2011  . Prostate cancer 10/20/2011  . Personal history of colonic polyps 12/16/2010    Past Medical History  Diagnosis Date  . BPH (benign prostatic hyperplasia)   . Adenomatous polyps 10/2002, 06/2009  . Arthritis   . Prostate cancer   . Hyperlipemia     Past Surgical History  Procedure Laterality Date  . Cystoscopy  04/2010  . Retropubic prostatectomy  09/2003  . Hip arthroplasty  04/2010    Left - Dr. Mayer Camel, right 2012  . Colonoscopy w/ polypectomy  11/04/2002    Sigmoid adenomas x2, 1 cm maximum  . Esophagogastroduodenoscopy  11/04/2002    Duodenitis, gastritis without H. pylori   . Colonoscopy w/ polypectomy  06/08/2009    6 polyps, largest 8 mm, at least 3 adenomas  . Joint replacement    . Prostate surgery    . Tonsillectomy    . Anterior cervical decomp/discectomy fusion N/A 09/18/2013    Procedure: Anterior cervical decompression fusion, cervical 4-5, cervical 5-6, cervical 6-7 with instrumentation and allograft;  Surgeon: Sinclair Ship, MD;  Location: [place];  Service: Orthopedics;  Laterality: N/A;  Anterior cervical decompression fusion, cervical 4-5, cervical 5-6, cervical 6-7 with instrumentation and allograft  . Colonoscopy    . Upper gastrointestinal endoscopy      History  Substance Use Topics  . Smoking status: Former Smoker    Types: Cigars    Quit date:  09/12/1972  . Smokeless tobacco: Never Used  . Alcohol Use: 1.2 oz/week    2 Cans of beer per week    Family History  Problem Relation Age of Onset  . Diabetes Mother   . Heart disease Mother   . Prostate cancer Father   . Colon cancer Neg Hx   . Stomach cancer Neg Hx   . Cancer Brother     prostate    Allergies  Allergen Reactions  . Iodine Anaphylaxis  . Ivp Dye [Iodinated Diagnostic Agents] Anaphylaxis    Medication list has been reviewed and updated.  Current Outpatient Prescriptions on File Prior to Visit  Medication Sig Dispense Refill  . albuterol (PROVENTIL HFA;VENTOLIN HFA) 108 (90 BASE) MCG/ACT inhaler Inhale 1-2 puffs into the lungs every 4 (four) hours as needed for wheezing or shortness of breath. 1 Inhaler 0  . aspirin 81 MG tablet Take 81 mg by mouth daily.    . Fish Oil OIL by Does not apply route. 1 teaspoon daily    . Multiple Vitamin (MULTIVITAMIN) capsule Take 1 capsule by mouth daily.      . simvastatin (ZOCOR) 20 MG tablet TAKE 1 TABLET DAILY. 90 tablet 0  . zolpidem (AMBIEN) 10 MG tablet Take 0.5 tablets (5 mg total) by mouth at bedtime as needed for sleep. 45 tablet 1   No current facility-administered medications on file prior to  visit.    Review of Systems:  As per HPI- otherwise negative.   Physical Examination: Filed Vitals:   04/09/15 0943  BP: 115/71  Pulse: 57  Temp: 99.1 F (37.3 C)  Resp: 16   Filed Vitals:   04/09/15 0943  Height: 6' (1.829 m)  Weight: 230 lb (104.327 kg)   Body mass index is 31.19 kg/(m^2). Ideal Body Weight: Weight in (lb) to have BMI = 25: 183.9  GEN: WDWN, NAD, Non-toxic, A & O x 3, overweight, looks well HEENT: Atraumatic, Normocephalic. Neck supple. No masses, No LAD.  Bilateral TM wnl, oropharynx normal.  PEERL,EOMI.   Ears and Nose: No external deformity. CV: RRR, No M/G/R. No JVD. No thrill. No extra heart sounds. PULM: CTA B, no wheezes, crackles, rhonchi. No retractions. No resp. distress. No  accessory muscle use. EXTR: No c/c/e NEURO Normal gait.  PSYCH: Normally interactive. Conversant. Not depressed or anxious appearing.  Calm demeanor.    Assessment and Plan: Immunization due - Plan: Pneumococcal conjugate vaccine 13-valent IM  prevnar 13 today   Signed Lamar Blinks, MD

## 2015-04-09 NOTE — Patient Instructions (Signed)
Good to see you today- take care  Let me know if your left shoulder is not continuing to improve Do some "wall crawls" with your fingers to maintain your shoulder range of motion  I am sorry that you had this fall- please let me know if you don't continue getting your strength back Assuming all is well please see me in about 4-6 months  

## 2015-05-20 ENCOUNTER — Telehealth: Payer: Self-pay

## 2015-05-20 DIAGNOSIS — G4700 Insomnia, unspecified: Secondary | ICD-10-CM

## 2015-05-20 MED ORDER — ZOLPIDEM TARTRATE 10 MG PO TABS: 5.0000 mg | ORAL_TABLET | Freq: Every evening | ORAL | Status: DC | PRN

## 2015-05-20 NOTE — Telephone Encounter (Signed)
Will refill temporarily, but should return to clinic to discuss prior to next refill.  Should only be taken half pill at a time and only when needed.

## 2015-05-20 NOTE — Telephone Encounter (Signed)
Refill request  zolpidem (AMBIEN) 10 MG tablet

## 2015-05-20 NOTE — Telephone Encounter (Signed)
Last OV pertaining to insomnia Donald Agreste, MD at 09/15/2013 4:51 PM Does pt need to RTC?

## 2015-05-21 NOTE — Telephone Encounter (Signed)
Pt.notified

## 2015-05-21 NOTE — Telephone Encounter (Signed)
Rx faxed

## 2015-05-22 ENCOUNTER — Ambulatory Visit (INDEPENDENT_AMBULATORY_CARE_PROVIDER_SITE_OTHER): Payer: Medicare Other | Admitting: Family Medicine

## 2015-05-22 VITALS — BP 112/60 | HR 52 | Temp 98.1°F | Resp 18 | Ht 72.5 in | Wt 231.6 lb

## 2015-05-22 DIAGNOSIS — G4700 Insomnia, unspecified: Secondary | ICD-10-CM | POA: Diagnosis not present

## 2015-05-22 DIAGNOSIS — E785 Hyperlipidemia, unspecified: Secondary | ICD-10-CM

## 2015-05-22 DIAGNOSIS — R748 Abnormal levels of other serum enzymes: Secondary | ICD-10-CM | POA: Diagnosis not present

## 2015-05-22 MED ORDER — ZOLPIDEM TARTRATE 10 MG PO TABS: 5.0000 mg | ORAL_TABLET | Freq: Every evening | ORAL | Status: DC | PRN

## 2015-05-22 NOTE — Progress Notes (Signed)
Subjective:  This chart was scribed for Donald Ray, MD by [facility], medical scribe at [facility].The patient was seen in exam room 06 and the patient's care was started at 9:28 AM.   Patient ID: Donald [name], male    DOB: [birth year], 74 y.o.   MRN: [id number] Chief Complaint  Patient presents with  . Medication Refill    need Ambien   HPI HPI Comments: Donald [name] is a 74 y.o. male with a history of insomnia, HLD, prostate cancer who presents to [facility] for an Ambien refill.   1. HLD: Myalgias with higher doses of Zocor in the past. Was tolerating Zocor 20 mg every other day at last visit in Jan. Some myalgias on this dose. He complains of leg cramping about once a week or less. Elevated CK at 290 8 months ago but decreased to 254 7 months ago. Pt is not fasted today. Exercises, Tai Chi and walking daily. 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  Lab Results  Component Value Date   ALT 33 05/26/2014   AST 25 05/26/2014   ALKPHOS 48 05/26/2014   BILITOT 0.5 05/26/2014   Lab Results  Component Value Date   CHOL 152 10/23/2014   HDL 45 10/23/2014   LDLCALC 88 10/23/2014   TRIG 96 10/23/2014   CHOLHDL 3.4 10/23/2014   2. Insomnia: Taking Ambien 5 mg (1/2 10 mg) at bedtime as needed for some time now. Currently taking 1/2 a night. Trouble falling asleep. No side effects on the Ambien. No lightheadedness or dizziness when first getting up in the morning. Experimented not taking the Ambien and he was unable to sleep. No anxiety or depression symptoms. Wakes up around 4:30 AM, typically falls back asleep.  Patient Active Problem List   Diagnosis Date Noted  . Radicular pain 09/18/2013  . Hyperlipidemia 10/20/2011  . Insomnia 10/20/2011  . Prostate cancer 10/20/2011  . Personal history of colonic polyps 12/16/2010   Past Medical History  Diagnosis Date  . BPH (benign prostatic hyperplasia)   . Adenomatous polyps  10/2002, 06/2009  . Arthritis   . Prostate cancer   . Hyperlipemia    Past Surgical History  Procedure Laterality Date  . Cystoscopy  04/2010  . Retropubic prostatectomy  09/2003  . Hip arthroplasty  04/2010    Left - Dr. Mayer Camel, right 2012  . Colonoscopy w/ polypectomy  11/04/2002    Sigmoid adenomas x2, 1 cm maximum  . Esophagogastroduodenoscopy  11/04/2002    Duodenitis, gastritis without H. pylori   . Colonoscopy w/ polypectomy  06/08/2009    6 polyps, largest 8 mm, at least 3 adenomas  . Joint replacement    . Prostate surgery    . Tonsillectomy    . Anterior cervical decomp/discectomy fusion N/A 09/18/2013    Procedure: Anterior cervical decompression fusion, cervical 4-5, cervical 5-6, cervical 6-7 with instrumentation and allograft;  Surgeon: Sinclair Ship, MD;  Location: [place];  Service: Orthopedics;  Laterality: N/A;  Anterior cervical decompression fusion, cervical 4-5, cervical 5-6, cervical 6-7 with instrumentation and allograft  . Colonoscopy    . Upper gastrointestinal endoscopy     Allergies  Allergen Reactions  . Iodine Anaphylaxis  . Ivp Dye [Iodinated Diagnostic Agents] Anaphylaxis   Prior to Admission medications   Medication Sig Start Date End Date Taking? Authorizing Provider  albuterol (PROVENTIL HFA;VENTOLIN HFA) 108 (90 BASE) MCG/ACT inhaler Inhale 1-2 puffs into the  lungs every 4 (four) hours as needed for wheezing or shortness of breath. 09/12/14  Yes [name], MD  aspirin 81 MG tablet Take 81 mg by mouth daily.   Yes Historical Provider, MD  Fish Oil OIL by Does not apply route. 1 teaspoon daily   Yes Historical Provider, MD  Multiple Vitamin (MULTIVITAMIN) capsule Take 1 capsule by mouth daily.     Yes Historical Provider, MD  simvastatin (ZOCOR) 20 MG tablet TAKE 1 TABLET DAILY. 03/11/15  Yes [name], PA-C  zolpidem (AMBIEN) 10 MG tablet Take 0.5 tablets (5 mg total) by mouth at bedtime as needed for sleep. 05/20/15  Yes [name], MD    Social History   Social History  . Marital Status: Married    Spouse Name: N/A  . Number of Children: N/A  . Years of Education: N/A   Occupational History  . Retired     [name]   Social History Main Topics  . Smoking status: Former Smoker    Types: Cigars    Quit date: 09/12/1972  . Smokeless tobacco: Never Used  . Alcohol Use: 1.2 oz/week    2 Cans of beer per week  . Drug Use: No  . Sexual Activity: Yes     Comment: number of sex partners in the last months 1, current birth control method - no prostate   Other Topics Concern  . Not on file   Social History Narrative   Exercise do Tai Chi daily and walking   Review of Systems  Constitutional: Negative for fatigue and unexpected weight change.  Eyes: Negative for visual disturbance.  Respiratory: Negative for cough, chest tightness and shortness of breath.   Cardiovascular: Negative for chest pain, palpitations and leg swelling.  Gastrointestinal: Negative for abdominal pain and blood in stool.  Musculoskeletal: Positive for myalgias.  Neurological: Negative for dizziness, light-headedness and headaches.  Psychiatric/Behavioral: Positive for sleep disturbance. Negative for dysphoric mood. The patient is not nervous/anxious.      Objective:  BP 112/60 mmHg  Pulse 52  Temp(Src) 98.1 F (36.7 C) (Oral)  Resp 18  Ht 6' 0.5" (1.842 m)  Wt 231 lb 9.6 oz (105.053 kg)  BMI 30.96 kg/m2  SpO2 98% Physical Exam  Constitutional: He is oriented to person, place, and time. He appears well-developed and well-nourished. No distress.  HENT:  Head: Normocephalic and atraumatic.  Eyes: EOM are normal. Pupils are equal, round, and reactive to light.  Neck: Normal range of motion. No JVD present. Carotid bruit is not present.  Cardiovascular: Normal rate, regular rhythm and normal heart sounds.   No murmur heard. Pulmonary/Chest: Effort normal and breath sounds normal. No respiratory distress. He has no rales.    Musculoskeletal: Normal range of motion. He exhibits no edema.  Neurological: He is alert and oriented to person, place, and time.  Skin: Skin is warm and dry.  Psychiatric: He has a normal mood and affect. His behavior is normal.  Nursing note and vitals reviewed.     Assessment & Plan:   Donald [name] is a 74 y.o. male Insomnia - Plan: zolpidem (AMBIEN) 10 MG tablet  Has tolerated 5 mg dose for some time now. No parasomnias, no side effects. Risks discussed, but will continue same dose of Ambien for now. Would consider Belsomra as alternative agent, but can continue discuss this at future visits. RTC precautions  Elevated CK - Plan: CK  -Borderline when last checked. Only rare myalgia, and may  not be related to his statin. Will recheck CPK, continue every other day dosing of statin for now.  Hyperlipidemia - Plan: Lipid panel, COMPLETE METABOLIC PANEL WITH GFR  -Controlled on every other day dosing of simvastatin. Considering going to every third day to see if this makes a difference with his muscle aches, but for now stay same dose.  Muscle aches may be unrelated to his statin, as improved with tai chi.   Meds ordered this encounter  Medications  . zolpidem (AMBIEN) 10 MG tablet    Sig: Take 0.5 tablets (5 mg total) by mouth at bedtime as needed for sleep.    Dispense:  45 tablet    Refill:  0    May fill after 08/16/15   Patient Instructions  Return in the next week for fasting labs, which should be 8 hours fasting. Once I have the results I can let you know by my chart.  We could try to space out the simvastatin to every third day, but your muscle cramps may not be related to that.  We'll see with your cholesterol numbers are first. Continue every other day for now.   Okay to continue Ambien at 5 mg dose for now. Let's continue to discuss this at future visits, as there are some other medications available now.  If you have any side effects from Ambien, let me know and we can  look at changing sooner.  Return to the clinic or go to the nearest emergency room if any of your symptoms worsen or new symptoms occur.     I personally performed the services described in this documentation, which was scribed in my presence. The recorded information has been reviewed and considered, and addended by me as needed.

## 2015-05-22 NOTE — Patient Instructions (Signed)
Return in the next week for fasting labs, which should be 8 hours fasting. Once I have the results I can let you know by my chart.  We could try to space out the simvastatin to every third day, but your muscle cramps may not be related to that.  We'll see with your cholesterol numbers are first. Continue every other day for now.   Okay to continue Ambien at 5 mg dose for now. Let's continue to discuss this at future visits, as there are some other medications available now.  If you have any side effects from Ambien, let me know and we can look at changing sooner.  Return to the clinic or go to the nearest emergency room if any of your symptoms worsen or new symptoms occur.

## 2015-05-25 ENCOUNTER — Other Ambulatory Visit (INDEPENDENT_AMBULATORY_CARE_PROVIDER_SITE_OTHER): Payer: Medicare Other

## 2015-05-25 DIAGNOSIS — E785 Hyperlipidemia, unspecified: Secondary | ICD-10-CM

## 2015-05-25 DIAGNOSIS — R748 Abnormal levels of other serum enzymes: Secondary | ICD-10-CM

## 2015-05-25 LAB — COMPLETE METABOLIC PANEL WITH GFR
ALT: 28 U/L (ref 9–46)
AST: 24 U/L (ref 10–35)
Albumin: 3.9 g/dL (ref 3.6–5.1)
Alkaline Phosphatase: 48 U/L (ref 40–115)
BUN: 17 mg/dL (ref 7–25)
CALCIUM: 8.9 mg/dL (ref 8.6–10.3)
CHLORIDE: 108 mmol/L (ref 98–110)
CO2: 25 mmol/L (ref 20–31)
CREATININE: 1.07 mg/dL (ref 0.70–1.18)
GFR, EST AFRICAN AMERICAN: 80 mL/min (ref >=60)
GFR, Est Non African American: 69 mL/min (ref >=60)
Glucose, Bld: 86 mg/dL (ref 65–99)
Potassium: 4.5 mmol/L (ref 3.5–5.3)
Sodium: 143 mmol/L (ref 135–146)
Total Bilirubin: 0.4 mg/dL (ref 0.2–1.2)
Total Protein: 6.2 g/dL (ref 6.1–8.1)

## 2015-05-25 LAB — LIPID PANEL
CHOL/HDL RATIO: 4.3 ratio (ref <=5.0)
CHOLESTEROL: 136 mg/dL (ref 125–200)
HDL: 32 mg/dL — ABNORMAL LOW (ref >=40)
LDL Cholesterol: 85 mg/dL (ref <130)
TRIGLYCERIDES: 93 mg/dL (ref <150)
VLDL: 19 mg/dL (ref <30)

## 2015-05-25 LAB — CK: CK TOTAL: 389 U/L — AB (ref 7–232)

## 2015-05-25 NOTE — Progress Notes (Signed)
Pt is here for lab work only. 

## 2015-05-31 ENCOUNTER — Encounter: Payer: Self-pay | Admitting: Family Medicine

## 2015-06-02 NOTE — Addendum Note (Signed)
Addended by: [name] R on: 06/02/2015 04:54 PM   Modules accepted: Orders

## 2015-06-19 NOTE — Telephone Encounter (Signed)
Replied by lab note on Mychart.

## 2015-07-16 ENCOUNTER — Encounter: Payer: Self-pay | Admitting: Internal Medicine

## 2015-08-02 IMAGING — CR DG CHEST 2V
2 series · 2 of 2 positions shown · non-contrast
Comparison: 11/17/2014

CLINICAL DATA: Persistent cough since before [REDACTED]. Seen on
[DATE] with bilateral atelectasis or infiltration. Still coughing
with reduction. Nasal congestion and drainage. Developed sharp chest
pain today.

EXAM:
CHEST  2 VIEW

[lateral]
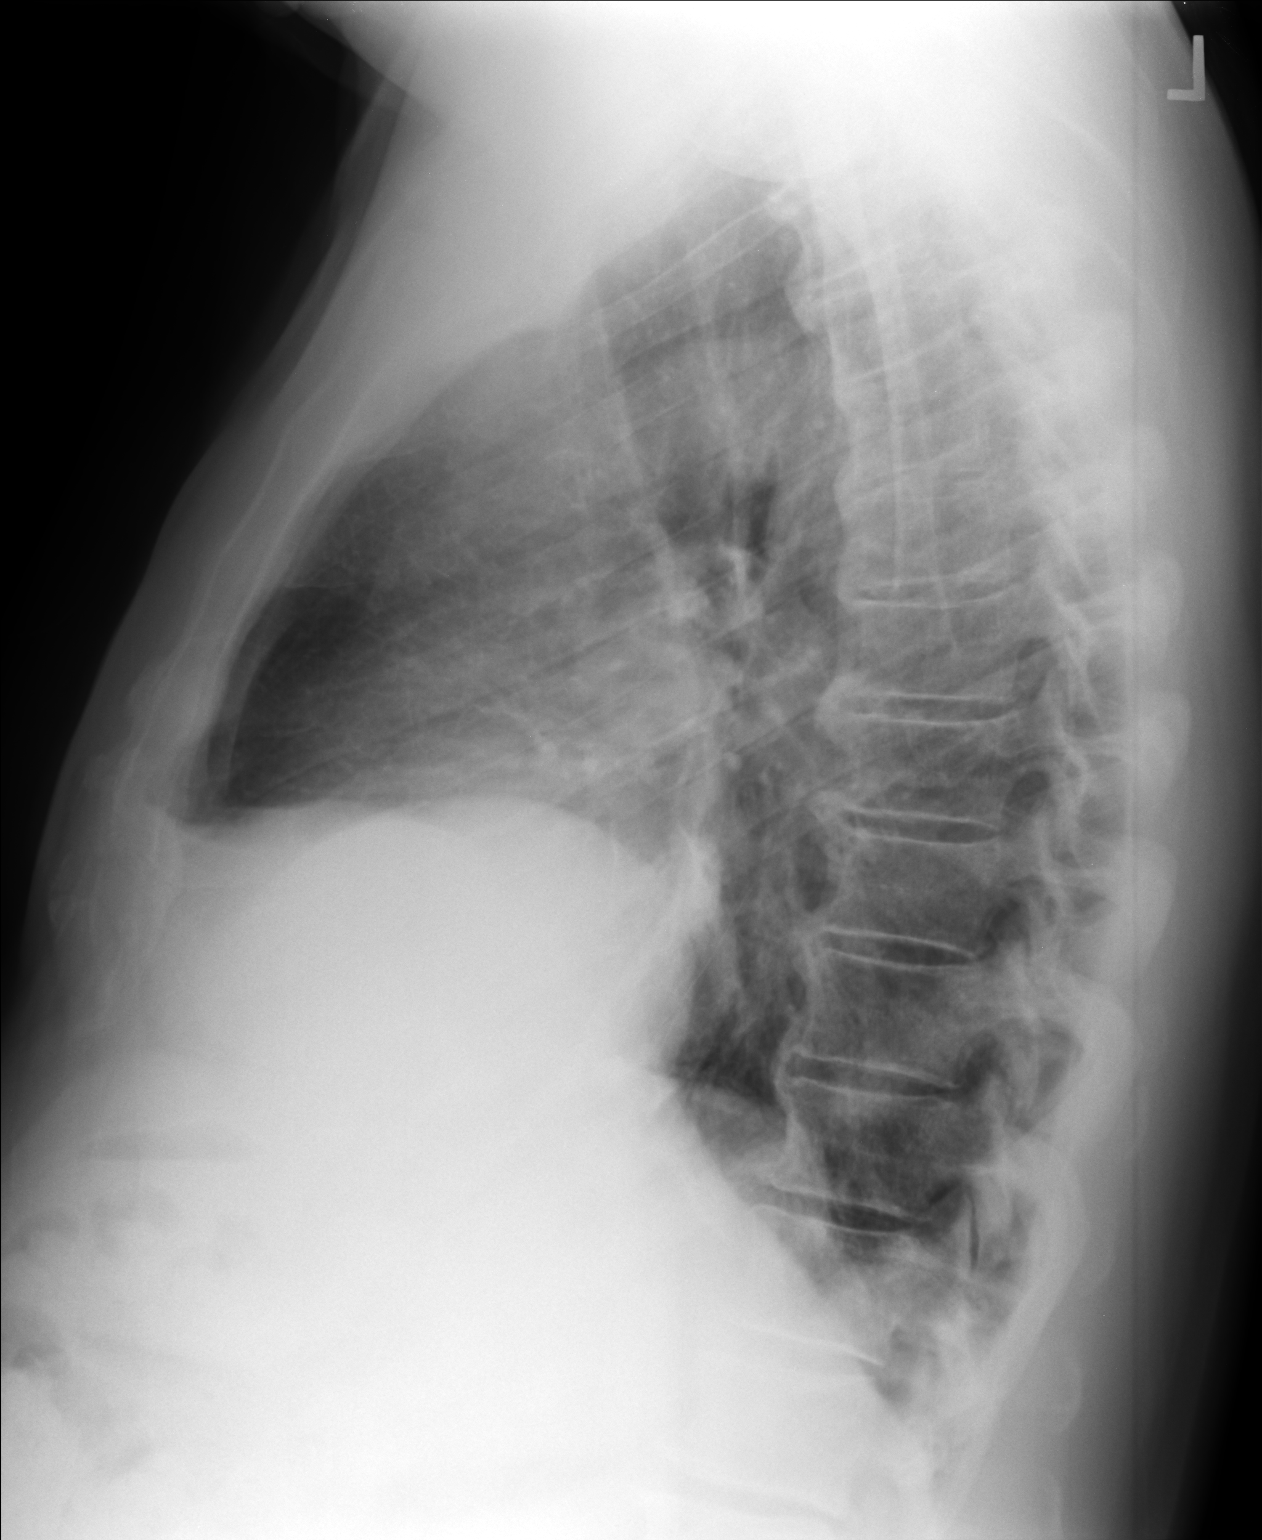

[PA]
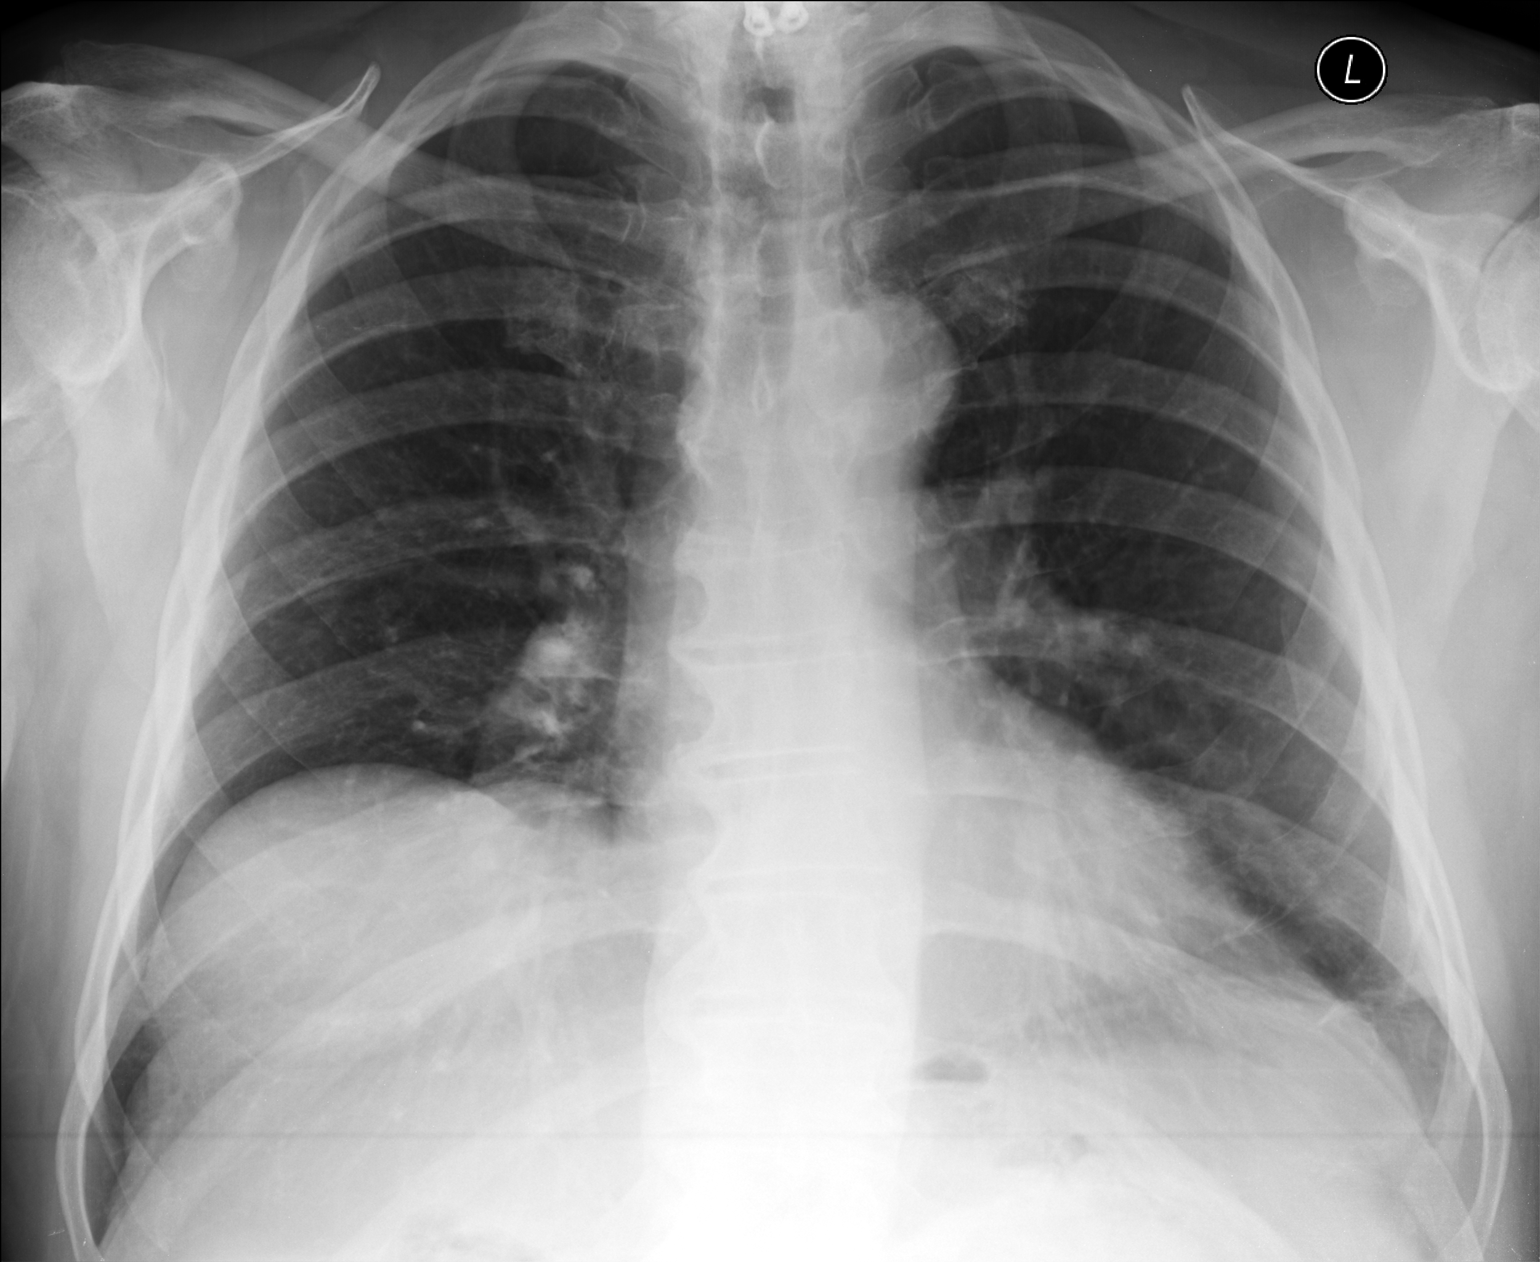

[2 of 2 positions shown; findings below may reference images not displayed]

FINDINGS: Shallow inspiration with elevation of the right hemidiaphragm.
Atelectasis or fibrosis in the left lung base. No focal airspace
disease or consolidation in the lungs. No blunting of costophrenic
angles. No pneumothorax. Mediastinal contours appear intact.
Degenerative changes in the spine. Postoperative changes in the
cervical spine. No significant change since previous study.
IMPRESSION: Atelectasis or fibrosis in the left lung base similar to prior
study. No evidence of active pulmonary disease.

## 2015-09-22 ENCOUNTER — Other Ambulatory Visit (INDEPENDENT_AMBULATORY_CARE_PROVIDER_SITE_OTHER): Payer: Medicare Other | Admitting: *Deleted

## 2015-09-22 DIAGNOSIS — R748 Abnormal levels of other serum enzymes: Secondary | ICD-10-CM

## 2015-09-22 LAB — CK: CK TOTAL: 439 U/L — AB (ref 7–232)

## 2015-09-28 ENCOUNTER — Ambulatory Visit (INDEPENDENT_AMBULATORY_CARE_PROVIDER_SITE_OTHER): Payer: Medicare Other | Admitting: Family Medicine

## 2015-09-28 VITALS — BP 124/82 | HR 58 | Temp 98.1°F | Resp 18 | Ht 72.5 in | Wt 230.0 lb

## 2015-09-28 DIAGNOSIS — E785 Hyperlipidemia, unspecified: Secondary | ICD-10-CM

## 2015-09-28 DIAGNOSIS — M791 Myalgia, unspecified site: Secondary | ICD-10-CM

## 2015-09-28 DIAGNOSIS — R748 Abnormal levels of other serum enzymes: Secondary | ICD-10-CM | POA: Diagnosis not present

## 2015-09-28 NOTE — Patient Instructions (Signed)
Stop the simvastatin altogether for now and fasting lab only visit in 1 month.  I will also refer you to rheumatology, but if levels are improved - may not need to see them. Return to the clinic or go to the nearest emergency room if any of your symptoms worsen or new symptoms occur.

## 2015-09-28 NOTE — Progress Notes (Signed)
Subjective:  By signing my name below, I, Donald [name], attest that this documentation has been prepared under the direction and in the presence of Donald Ray, MD.  [name], Medical Scribe. 09/28/2015.  6:27 PM.  I personally performed the services described in this documentation, which was scribed in my presence. The recorded information has been reviewed and considered, and addended by me as needed.      Patient ID: Donald [name], male    DOB: 05-26-1941, 75 y.o.   MRN: [id number]  Chief Complaint  Patient presents with  . Discuss Medication    Simvastatin    HPI HPI Comments: Donald [name] is a 75 y.o. male who presents to [facility] requesting to discuss medications.   HLD: Pt is on Zocor 20 mg qd. See last visit. Pt has had an elevated CK in the past, and myalgias with higher doses of the Zocor. As of last visit in September, he is taking 1 dose every other day, advised to try every 3rd day to see inf the muscle pain is related to the medication.  Lab Results  Component Value Date   CHOL 136 05/25/2015   HDL 32* 05/25/2015   LDLCALC 85 05/25/2015   TRIG 93 05/25/2015   CHOLHDL 4.3 05/25/2015  He did have a CK level drawn 6 days ago with a level of 439 which was increased from 3 months ago of 389. Lab Results  Component Value Date   ALT 28 05/25/2015   AST 24 05/25/2015   ALKPHOS 48 05/25/2015   BILITOT 0.4 05/25/2015   Today, pt reports that he is taking 2 dosed per week (mondays and fridays) for about 1.5-2 months. He indicates that he still experienced myalgias in the calves and legs occasionally. He reports that he has been experiencing his leg pain for about 10 years now. He states that when first waking up in the morning, he is noticing right hip stiffness for about 5 months now. He denies recently travels.    Patient Active Problem List   Diagnosis Date Noted  . Radicular pain 09/18/2013  . Hyperlipidemia 10/20/2011    . Insomnia 10/20/2011  . Prostate cancer ([place]) 10/20/2011  . Personal history of colonic polyps 12/16/2010   Past Medical History  Diagnosis Date  . BPH (benign prostatic hyperplasia)   . Adenomatous polyps 10/2002, 06/2009  . Arthritis   . Prostate cancer ([place])   . Hyperlipemia    Past Surgical History  Procedure Laterality Date  . Cystoscopy  04/2010  . Retropubic prostatectomy  09/2003  . Hip arthroplasty  04/2010    Left - Dr. Mayer Camel, right 2012  . Colonoscopy w/ polypectomy  11/04/2002    Sigmoid adenomas x2, 1 cm maximum  . Esophagogastroduodenoscopy  11/04/2002    Duodenitis, gastritis without H. pylori   . Colonoscopy w/ polypectomy  06/08/2009    6 polyps, largest 8 mm, at least 3 adenomas  . Joint replacement    . Prostate surgery    . Tonsillectomy    . Anterior cervical decomp/discectomy fusion N/A 09/18/2013    Procedure: Anterior cervical decompression fusion, cervical 4-5, cervical 5-6, cervical 6-7 with instrumentation and allograft;  Surgeon: Sinclair Ship, MD;  Location: [place];  Service: Orthopedics;  Laterality: N/A;  Anterior cervical decompression fusion, cervical 4-5, cervical 5-6, cervical 6-7 with instrumentation and allograft  . Colonoscopy    . Upper gastrointestinal endoscopy     Allergies  Allergen Reactions  . Iodine Anaphylaxis  . Ivp Dye [Iodinated Diagnostic Agents] Anaphylaxis   Prior to Admission medications   Medication Sig Start Date End Date Taking? Authorizing Provider  aspirin 81 MG tablet Take 81 mg by mouth daily.   Yes Historical Provider, MD  Fish Oil OIL by Does not apply route. 1 teaspoon daily   Yes Historical Provider, MD  Multiple Vitamin (MULTIVITAMIN) capsule Take 1 capsule by mouth daily.     Yes Historical Provider, MD  simvastatin (ZOCOR) 20 MG tablet TAKE 1 TABLET DAILY. 03/11/15  Yes [name], PA-C  zolpidem (AMBIEN) 10 MG tablet Take 0.5 tablets (5 mg total) by mouth at bedtime as needed for sleep. 05/22/15  Yes  [name], MD  albuterol (PROVENTIL HFA;VENTOLIN HFA) 108 (90 BASE) MCG/ACT inhaler Inhale 1-2 puffs into the lungs every 4 (four) hours as needed for wheezing or shortness of breath. Patient not taking: Reported on 09/28/2015 09/12/14   [name], MD   Social History   Social History  . Marital Status: Married    Spouse Name: N/A  . Number of Children: N/A  . Years of Education: N/A   Occupational History  . Retired     [name]   Social History Main Topics  . Smoking status: Former Smoker    Types: Cigars    Quit date: 09/12/1972  . Smokeless tobacco: Never Used  . Alcohol Use: 1.2 oz/week    2 Cans of beer per week  . Drug Use: No  . Sexual Activity: Yes     Comment: number of sex partners in the last months 1, current birth control method - no prostate   Other Topics Concern  . Not on file   Social History Narrative   Exercise do Tai Chi daily and walking    Review of Systems  Constitutional: Negative for fever.  Musculoskeletal: Positive for myalgias and arthralgias.      Objective:   Physical Exam  Constitutional: He is oriented to person, place, and time. He appears well-developed and well-nourished. No distress.  HENT:  Head: Normocephalic and atraumatic.  Eyes: EOM are normal. Pupils are equal, round, and reactive to light.  Neck: Neck supple.  Cardiovascular: Normal rate.   Pulmonary/Chest: Effort normal.  Musculoskeletal:  No pain with reflexion or extension of the knee. No pain with internal or external rotation.  No calves pain.   Neurological: He is alert and oriented to person, place, and time. No cranial nerve deficit.  Skin: Skin is warm and dry.  Psychiatric: He has a normal mood and affect. His behavior is normal.  Nursing note and vitals reviewed.   Filed Vitals:   09/28/15 1726  BP: 124/82  Pulse: 58  Temp: 98.1 F (36.7 C)  TempSrc: Oral  Resp: 18  Height: 6' 0.5" (1.842 m)  Weight: 230 lb (104.327 kg)  SpO2:  96%      Assessment & Plan:  Donald [name] is a 75 y.o. male Hyperlipidemia - Plan: Lipid panel  Elevated CPK - Plan: CK, Aldolase, Ambulatory referral to Rheumatology  Myalgia - Plan: CK, Aldolase, Ambulatory referral to Rheumatology   persistent elevated CPK, now higher reading on lower dose of statin and intermittent myalgias of lower legs. Lipids looked good at last check.  -stop statin  -CPK and aldolase in 1 month with repeat lipid panel.   -refer to rheumatology, but may be able to cancel this if labs normalize off statin  -rtc  precautions.   No orders of the defined types were placed in this encounter.   Patient Instructions  Stop the simvastatin altogether for now and fasting lab only visit in 1 month.  I will also refer you to rheumatology, but if levels are improved - may not need to see them. Return to the clinic or go to the nearest emergency room if any of your symptoms worsen or new symptoms occur.   I personally performed the services described in this documentation, which was scribed in my presence. The recorded information has been reviewed and considered, and addended by me as needed.

## 2015-10-29 ENCOUNTER — Other Ambulatory Visit (INDEPENDENT_AMBULATORY_CARE_PROVIDER_SITE_OTHER): Payer: Medicare Other | Admitting: *Deleted

## 2015-10-29 DIAGNOSIS — M791 Myalgia, unspecified site: Secondary | ICD-10-CM

## 2015-10-29 DIAGNOSIS — R748 Abnormal levels of other serum enzymes: Secondary | ICD-10-CM

## 2015-10-29 DIAGNOSIS — E785 Hyperlipidemia, unspecified: Secondary | ICD-10-CM | POA: Diagnosis not present

## 2015-10-29 LAB — LIPID PANEL
CHOL/HDL RATIO: 5 ratio (ref <=5.0)
CHOLESTEROL: 180 mg/dL (ref 125–200)
HDL: 36 mg/dL — AB (ref >=40)
LDL Cholesterol: 125 mg/dL (ref <130)
TRIGLYCERIDES: 97 mg/dL (ref <150)
VLDL: 19 mg/dL (ref <30)

## 2015-10-29 LAB — CK: Total CK: 261 U/L — ABNORMAL HIGH (ref 7–232)

## 2015-11-01 LAB — ALDOLASE: Aldolase: 6.3 U/L (ref <=8.1)

## 2015-11-08 ENCOUNTER — Ambulatory Visit (INDEPENDENT_AMBULATORY_CARE_PROVIDER_SITE_OTHER): Payer: Medicare Other | Admitting: Family Medicine

## 2015-11-08 ENCOUNTER — Ambulatory Visit (INDEPENDENT_AMBULATORY_CARE_PROVIDER_SITE_OTHER): Payer: Medicare Other

## 2015-11-08 VITALS — BP 118/64 | HR 55 | Temp 98.6°F | Resp 16 | Ht 73.0 in | Wt 231.0 lb

## 2015-11-08 DIAGNOSIS — R29898 Other symptoms and signs involving the musculoskeletal system: Secondary | ICD-10-CM

## 2015-11-08 DIAGNOSIS — R208 Other disturbances of skin sensation: Secondary | ICD-10-CM

## 2015-11-08 DIAGNOSIS — G4700 Insomnia, unspecified: Secondary | ICD-10-CM

## 2015-11-08 DIAGNOSIS — R748 Abnormal levels of other serum enzymes: Secondary | ICD-10-CM

## 2015-11-08 MED ORDER — ZOLPIDEM TARTRATE 10 MG PO TABS: 5.0000 mg | ORAL_TABLET | Freq: Every evening | ORAL | Status: DC | PRN

## 2015-11-08 NOTE — Patient Instructions (Addendum)
Because you received an x-ray today, you will receive an invoice from [facility]. Please contact [facility] at [phone] with questions or concerns regarding your invoice. Our billing staff will not be able to assist you with those questions.  You have some degenerative changes of the lumbar spine, but no acute findings. Keep follow-up with rheumatologist as planned, I will check other inflammation test, but if weakness in legs persists, or any new upper extremity weakness, slurred speech, increased headache, or other new/worsening symptoms, return here or the emergency room right away.  If persistent numbness and weakness in legs without a source when seen by rheumatologist, could consider workup for peripheral vascular disease or MRI of your spine for disc issue.  Return to the clinic or go to the nearest emergency room if any of your symptoms worsen or new symptoms occur.

## 2015-11-08 NOTE — Progress Notes (Signed)
Subjective:  By signing my name below, I, Donald [name], attest that this documentation has been prepared under the direction and in the presence of [name], MD. Electronically Signed: Moises [name], [place]. 11/08/2015 , 6:35PM .  Patient was seen in Room 5 .   Patient ID: Donald [name], male    DOB: May 03, 1941, 75 y.o.   MRN: [id number] Chief Complaint  Patient presents with  . Leg Pain  . Fatigue  . legs give out  . Medication Refill    ambien   HPI Donald [name] is a 75 y.o. male Has h/o HLD with elevated CPK, insomnia and prostate cancer.   HLD with elevated CPK Noted higher dose of zocor. He had tried taking 1 dose every other day then down to 2 doses per week at last visit in Jan. He's still having myalgia in calves and legs but leg pain has been there for approximately 10 years with right hip stiffness for about 5 months. He had a higher CPK reading with the zocor at 439 on Jan 11. Due to the intermittent myalgia of lower legs, we decided to stop statin altogether and referred to rheumatology. He had a lab only visit 10 days ago with slightly lower CPK of 261 and lipids were okay.   Lab Results  Component Value Date   CHOL 180 10/29/2015   HDL 36* 10/29/2015   LDLCALC 125 10/29/2015   TRIG 97 10/29/2015   CHOLHDL 5.0 10/29/2015   He also had an aldolase testing done and it was normal.   Insomnia He uses ambien 5 mg for years without any side effects. See discussion in September. He denies any known complications.   Leg Pain He states that his legs have gotten worse. He was doing taichi this morning, which he does regularly, but felt that his right leg gave way. He usually feels this first thing in the morning or when he gets out of a chair. He also mentions that if he crosses his legs, they fall asleep easily. He notes feeling better the more he walks. He has an occasional headache that started 5 years ago. He denies any fall. He denies any weakness in his  arms, or slurred speech. He denies any hip pain, soreness in his knees, or soreness in his calves. He also denies any urinary or bowel incontinence.   Rheumatology He has an appointment with Dr. Charlestine Night on March 9th.   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 He feels unfulfilled and wants to do more. He used to have projects but now denies having anymore. He denies depression.   Personal He's planning a trip with his wife. Recently anniversary of 50 years.   Patient Active Problem List   Diagnosis Date Noted  . Radicular pain 09/18/2013  . Hyperlipidemia 10/20/2011  . Insomnia 10/20/2011  . Prostate cancer ([place]) 10/20/2011  . Personal history of colonic polyps 12/16/2010   Past Medical History  Diagnosis Date  . BPH (benign prostatic hyperplasia)   . Adenomatous polyps 10/2002, 06/2009  . Arthritis   . Prostate cancer ([place])   . Hyperlipemia    Past Surgical History  Procedure Laterality Date  . Cystoscopy  04/2010  . Retropubic prostatectomy  09/2003  . Hip arthroplasty  04/2010    Left - Dr. Mayer Camel, right 2012  . Colonoscopy w/ polypectomy  11/04/2002    Sigmoid adenomas x2, 1 cm maximum  . Esophagogastroduodenoscopy  11/04/2002    Duodenitis, gastritis without H. pylori   . Colonoscopy w/ polypectomy  06/08/2009    6 polyps, largest 8 mm, at least 3 adenomas  . Joint replacement    . Prostate surgery    . Tonsillectomy    . Anterior cervical decomp/discectomy fusion N/A 09/18/2013    Procedure: Anterior cervical decompression fusion, cervical 4-5, cervical 5-6, cervical 6-7 with instrumentation and allograft;  Surgeon: Sinclair Ship, MD;  Location: [facility];  Service: Orthopedics;  Laterality: N/A;  Anterior cervical decompression fusion, cervical 4-5, cervical 5-6, cervical 6-7 with instrumentation and allograft  . Colonoscopy    . Upper gastrointestinal endoscopy     Allergies  Allergen Reactions  . Iodine  Anaphylaxis  . Ivp Dye [Iodinated Diagnostic Agents] Anaphylaxis   Prior to Admission medications   Medication Sig Start Date End Date Taking? Authorizing Provider  albuterol (PROVENTIL HFA;VENTOLIN HFA) 108 (90 BASE) MCG/ACT inhaler Inhale 1-2 puffs into the lungs every 4 (four) hours as needed for wheezing or shortness of breath. Patient not taking: Reported on 09/28/2015 09/12/14   [name], MD  aspirin 81 MG tablet Take 81 mg by mouth daily.    Historical Provider, MD  Fish Oil OIL by Does not apply route. 1 teaspoon daily    Historical Provider, MD  Multiple Vitamin (MULTIVITAMIN) capsule Take 1 capsule by mouth daily.      Historical Provider, MD  simvastatin (ZOCOR) 20 MG tablet TAKE 1 TABLET DAILY. 03/11/15   Donald Bale, PA-C  zolpidem (AMBIEN) 10 MG tablet Take 0.5 tablets (5 mg total) by mouth at bedtime as needed for sleep. 05/22/15   [name], MD   Social History   Social History  . Marital Status: Married    Spouse Name: N/A  . Number of Children: N/A  . Years of Education: N/A   Occupational History  . Retired     Donald [name]   Social History Main Topics  . Smoking status: Former Smoker    Types: Cigars    Quit date: 09/12/1972  . Smokeless tobacco: Never Used  . Alcohol Use: 1.2 oz/week    2 Cans of beer per week  . Drug Use: No  . Sexual Activity: Yes     Comment: number of sex partners in the last months 1, current birth control method - no prostate   Other Topics Concern  . Not on file   Social History Narrative   Exercise do Tai Chi daily and walking   Review of Systems  Constitutional: Positive for fatigue. Negative for fever, activity change and appetite change.  Gastrointestinal: Negative for nausea, vomiting, diarrhea and constipation.  Genitourinary: Negative for frequency, hematuria and difficulty urinating.  Musculoskeletal: Positive for myalgias. Negative for joint swelling, arthralgias and gait problem.  Neurological: Positive  for weakness. Negative for speech difficulty and headaches.  Psychiatric/Behavioral: Positive for sleep disturbance.      Objective:   Physical Exam  Constitutional: He is oriented to person, place, and time. He appears well-developed and well-nourished. No distress.  HENT:  Head: Normocephalic and atraumatic.  Eyes: EOM are normal. Pupils are equal, round, and reactive to light.  Neck: Neck supple.  Cardiovascular: Normal rate.   Pulses:      Dorsalis pedis pulses are 1+ on the right side, and 2+ on the left side.  Pulmonary/Chest: Effort normal. No respiratory distress.  Musculoskeletal: Normal range of motion. He exhibits no edema.  Some difficulty to toe walk  on right, unable to toe walk on left, which is normal for him, able to heel walk without difficulty, L-spine full flexion, full extension, grossly equal strength of lower extremities, cap refill of right toes <1second  Neurological: He is alert and oriented to person, place, and time. He displays a negative Romberg sign.  Reflex Scores:      Patellar reflexes are 2+ on the right side and 2+ on the left side.      Achilles reflexes are 2+ on the right side and 2+ on the left side. No pronator drift, normal finger-to-nose exam  Skin: Skin is warm and dry.  Psychiatric: He has a normal mood and affect. His behavior is normal.  Nursing note and vitals reviewed.   Filed Vitals:   11/08/15 1626  BP: 118/64  Pulse: 55  Temp: 98.6 F (37 C)  TempSrc: Oral  Resp: 16  Height: 6' 1"  (1.854 m)  Weight: 231 lb (104.781 kg)  SpO2: 98%   Dg Lumbar Spine 2-3 Views  11/08/2015  CLINICAL DATA:  Intermittent right leg weakness. EXAM: LUMBAR SPINE - 2-3 VIEW COMPARISON:  None. FINDINGS: Normal alignment of the lumbar vertebral bodies. 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 No acute bony findings or destructive bony changes. No definite pars defects. Bilateral hip prostheses are noted. The visualized bony pelvis is intact.  IMPRESSION: Moderate degenerative changes involving the lumbar spine but no acute bony findings. Electronically Signed   By: Marijo Sanes M.D.   On: 11/08/2015 18:58      Assessment & Plan:    KARO ROG is a 75 y.o. male Right leg weakness - Plan: DG Lumbar Spine 2-3 Views, Sedimentation rate, CANCELED: POCT SEDIMENTATION RATE  Insomnia - Plan: zolpidem (AMBIEN) 10 MG tablet  Dysesthesia - Plan: DG Lumbar Spine 2-3 Views, Sedimentation rate, CANCELED: POCT SEDIMENTATION RATE  Elevated creatine kinase level - Plan: Sedimentation rate, CANCELED: POCT SEDIMENTATION RATE   History of elevated CPK, slightly improved coming off of statin. He also has had a history of a neuropathy involving the left foot, but now with proximal leg weakness when first standing up that improves with walking, and 1 episode today of subjective weakness of the right leg. No focal neurologic findings on exam today except for some difficulty going on toes both sides especially left, but he feels this is been a chronic issue for him. No appreciated weakness on exam. Differential diagnosis of spinal stenosis with history of cervical disc disease, and possible lumbar spinal stenosis, polymyalgia rheumatica.   -Will check sedimentation rate, continue planned follow-up with rheumatology. If any increase in weakness, or focal weakness occurs again, recommend a follow-up here or acute neurologic symptoms, ER evaluation. ER/RTc precautions discussed.  Insomnia  -Stable, denies any parasomnias or side effects from Ambien. Refilled and discussed potential for side effects.  Meds ordered this encounter  Medications  . zolpidem (AMBIEN) 10 MG tablet    Sig: Take 0.5 tablets (5 mg total) by mouth at bedtime as needed for sleep.    Dispense:  45 tablet    Refill:  1   Patient Instructions  Because you received an x-ray today, you will receive an invoice from [facility]. Please contact [facility] at  [phone] with questions or concerns regarding your invoice. Our billing staff will not be able to assist you with those questions.  You have some degenerative changes of the lumbar spine, but no acute findings. Keep follow-up with rheumatologist as planned, I will  check other inflammation test, but if weakness in legs persists, or any new upper extremity weakness, slurred speech, increased headache, or other new/worsening symptoms, return here or the emergency room right away.  If persistent numbness and weakness in legs without a source when seen by rheumatologist, could consider workup for peripheral vascular disease or MRI of your spine for disc issue.  Return to the clinic or go to the nearest emergency room if any of your symptoms worsen or new symptoms occur.     I personally performed the services described in this documentation, which was scribed in my presence. The recorded information has been reviewed and considered, and addended by me as needed.

## 2015-11-09 LAB — SEDIMENTATION RATE: Sed Rate: 4 mm/hr (ref 0–20)

## 2015-11-24 ENCOUNTER — Ambulatory Visit (HOSPITAL_COMMUNITY)
Admission: RE | Admit: 2015-11-24 | Discharge: 2015-11-24 | Disposition: A | Discharge status: Home / Self Care | Payer: Medicare Other | Source: Ambulatory Visit | Attending: Vascular Surgery | Admitting: Vascular Surgery

## 2015-11-24 ENCOUNTER — Other Ambulatory Visit (HOSPITAL_COMMUNITY): Payer: Self-pay | Admitting: Rheumatology

## 2015-11-24 DIAGNOSIS — R200 Anesthesia of skin: Secondary | ICD-10-CM | POA: Diagnosis not present

## 2015-11-24 DIAGNOSIS — R208 Other disturbances of skin sensation: Secondary | ICD-10-CM

## 2015-12-01 ENCOUNTER — Telehealth: Payer: Self-pay | Admitting: *Deleted

## 2015-12-01 NOTE — Telephone Encounter (Signed)
Called Dr. Elmon Else office to let him know that Dr. Scot Dock had reviewed patient's ABI done 11-24-15 in our office. Per Dr. Scot Dock, based on this scan, patient does not need follow up at this time. Should patient have PVD problems, we will be happy to see.

## 2015-12-20 ENCOUNTER — Other Ambulatory Visit: Payer: Self-pay | Admitting: Rheumatology

## 2015-12-20 DIAGNOSIS — M5416 Radiculopathy, lumbar region: Secondary | ICD-10-CM

## 2015-12-25 ENCOUNTER — Ambulatory Visit
Admission: RE | Admit: 2015-12-25 | Discharge: 2015-12-25 | Disposition: A | Discharge status: Home / Self Care | Payer: Medicare Other | Source: Ambulatory Visit | Attending: Rheumatology | Admitting: Rheumatology

## 2015-12-25 DIAGNOSIS — M5416 Radiculopathy, lumbar region: Secondary | ICD-10-CM

## 2015-12-27 ENCOUNTER — Encounter: Payer: Self-pay | Admitting: Family Medicine

## 2015-12-28 ENCOUNTER — Other Ambulatory Visit: Payer: Self-pay | Admitting: Rheumatology

## 2015-12-28 DIAGNOSIS — M5416 Radiculopathy, lumbar region: Secondary | ICD-10-CM

## 2015-12-28 DIAGNOSIS — IMO0002 Reserved for concepts with insufficient information to code with codable children: Secondary | ICD-10-CM

## 2015-12-28 DIAGNOSIS — M48061 Spinal stenosis, lumbar region without neurogenic claudication: Secondary | ICD-10-CM

## 2015-12-28 NOTE — Progress Notes (Signed)
13 hour prep called to [facility], will call pt instructions.

## 2015-12-29 ENCOUNTER — Ambulatory Visit (INDEPENDENT_AMBULATORY_CARE_PROVIDER_SITE_OTHER): Payer: Medicare Other | Admitting: Family Medicine

## 2015-12-29 ENCOUNTER — Encounter: Payer: Self-pay | Admitting: Family Medicine

## 2015-12-29 VITALS — BP 120/62 | HR 56 | Temp 98.6°F | Resp 16 | Ht 72.0 in | Wt 233.4 lb

## 2015-12-29 DIAGNOSIS — M48061 Spinal stenosis, lumbar region without neurogenic claudication: Secondary | ICD-10-CM

## 2015-12-29 DIAGNOSIS — M4806 Spinal stenosis, lumbar region: Secondary | ICD-10-CM

## 2015-12-29 DIAGNOSIS — E785 Hyperlipidemia, unspecified: Secondary | ICD-10-CM | POA: Diagnosis not present

## 2015-12-29 DIAGNOSIS — G4700 Insomnia, unspecified: Secondary | ICD-10-CM | POA: Diagnosis not present

## 2015-12-29 DIAGNOSIS — Z0000 Encounter for general adult medical examination without abnormal findings: Secondary | ICD-10-CM

## 2015-12-29 NOTE — Patient Instructions (Addendum)
IF you received an x-ray today, you will receive an invoice from [facility]. Please contact [facility] at [phone] with questions or concerns regarding your invoice.   IF you received labwork today, you will receive an invoice from [employer]. Please contact [facility] at [phone] with questions or concerns regarding your invoice.   Our billing staff will not be able to assist you with questions regarding bills from these companies.  You will be contacted with the lab results as soon as they are available. The fastest way to get your results is to activate your My Chart account. Instructions are located on the last page of this paperwork. If you have not heard from [place] regarding the results in 2 weeks, please contact this office.    Depending on your results form epidural spinal injection - discuss next step with Dr. Charlestine Night, but I will also refer you to [name] to discuss options prior to [place].   Return in next week if possible for fasting lab visit only.   Return to the clinic or go to the nearest emergency room if any of your symptoms worsen or new symptoms occur.  Keeping you healthy  Get these tests  Blood pressure- Have your blood pressure checked once a year by your healthcare provider.  Normal blood pressure is 120/80  Weight- Have your body mass index (BMI) calculated to screen for obesity.  BMI is a measure of body fat based on height and weight. You can also calculate your own BMI at [employer]Bank[employer].si.  Cholesterol- Have your cholesterol checked every year.  Diabetes- Have your blood sugar checked regularly if you have high blood pressure, high cholesterol, have a family history of diabetes or if you are overweight.  Screening for Colon Cancer- Colonoscopy starting at age 19.  Screening may begin sooner depending on your family history and other health conditions. Follow up colonoscopy as directed by your  Gastroenterologist.  Screening for Prostate Cancer- Both blood work (PSA) and a rectal exam help screen for Prostate Cancer.  Screening begins at age 91 with African-American men and at age 24 with Caucasian men.  Screening may begin sooner depending on your family history.  Take these medicines  Aspirin- One aspirin daily can help prevent Heart disease and Stroke.  Flu shot- Every fall.  Tetanus- Every 10 years.  Zostavax- Once after the age of 10 to prevent Shingles.  Pneumonia shot- Once after the age of 46; if you are younger than 58, ask your healthcare provider if you need a Pneumonia shot.  Take these steps  Don't smoke- If you do smoke, talk to your doctor about quitting.  For tips on how to quit, go to www.smokefree.gov or call 1-800-QUIT-NOW.  Be physically active- Exercise 5 days a week for at least 30 minutes.  If you are not already physically active start slow and gradually work up to 30 minutes of moderate physical activity.  Examples of moderate activity include walking briskly, mowing the yard, dancing, swimming, bicycling, etc.  Eat a healthy diet- Eat a variety of healthy food such as fruits, vegetables, low fat milk, low fat cheese, yogurt, lean meant, poultry, fish, beans, tofu, etc. For more information go to www.thenutritionsource.org  Drink alcohol in moderation- Limit alcohol intake to less than two drinks a day. Never drink and drive.  Dentist- Brush and floss twice daily; visit your dentist twice a year.  Depression- Your emotional health is as important as your physical health.  If you're feeling down, or losing interest in things you would normally enjoy please talk to your healthcare provider.  Eye exam- Visit your eye doctor every year.  Safe sex- If you may be exposed to a sexually transmitted infection, use a condom.  Seat belts- Seat belts can save your life; always wear one.  Smoke/Carbon Monoxide detectors- These detectors need to be installed on  the appropriate level of your home.  Replace batteries at least once a year.  Skin cancer- When out in the sun, cover up and use sunscreen 15 SPF or higher.  Violence- If anyone is threatening you, please tell your healthcare provider.  Living Will/ Health care power of attorney- Speak with your healthcare provider and family.

## 2015-12-29 NOTE — Progress Notes (Signed)
   Subjective:    Patient ID: Donald [name], male    DOB: December 13, 1940, 75 y.o.   MRN: [id number]  HPI    Review of Systems  HENT: Positive for sore throat and trouble swallowing.   Eyes: Negative.   Respiratory: Negative.   Cardiovascular: Negative.   Gastrointestinal: Negative.   Endocrine: Negative.   Genitourinary: Negative.   Musculoskeletal: Positive for myalgias, arthralgias and gait problem.  Skin: Negative.   Allergic/Immunologic: Positive for environmental allergies.  Neurological: Positive for weakness and numbness.  Hematological: Negative.   Psychiatric/Behavioral: Negative.        Objective:   Physical Exam        Assessment & Plan:

## 2015-12-29 NOTE — Progress Notes (Signed)
By signing my name below, I, [name], attest that this documentation has been prepared under the direction and in the presence of [name], MD.  Electronically Signed: [name], Medical Scribe. 12/29/2015. 10:55 AM.  Subjective:    Patient ID: Donald [name], male    DOB: [birth year], 75 y.o.   MRN: [id number]  HPI Chief Complaint  Patient presents with  . Annual Exam   HPI Comments: Donald [name] with a hx of colon cancer and leg pain is a 75 y.o. male who presents to the [facility] for annual physical.  Cancer Screenings: Colon- Last colonoscopy March 2015. Few, 3 polyps removed. Planned to repeat in 2018.  Prostate - Hx of prostate cancer with radial prostatectomy Jan 2005 followed by urology. Recent PSA was reported okay by Dr. Charlestine Night  Depression: Depression screen [facility] 2/9 12/29/2015 09/28/2015 05/22/2015 04/09/2015 05/25/2014  Decreased Interest 0 0 0 0 0  Down, Depressed, Hopeless 0 0 0 0 0  PHQ - 2 Score 0 0 0 0 0   Fall screening: Does admit to fall last year with some weakness in his leg while doing Tai-Chi.  Functional status survey: Only positive for difficulty climbing stairs  Immunizations: Immunization History  Administered Date(s) Administered  . Influenza Split 07/03/2011, 06/25/2012  . Influenza,inj,Quad PF,36+ Mos 06/05/2013, 05/25/2014  . Influenza-Unspecified 06/12/2015  . Pneumococcal Conjugate-13 04/09/2015  . Pneumococcal Polysaccharide-23 10/20/2011  . Tdap 03/12/2007  . Zoster 12/03/2012   Dentist: Doristine Church twice a year.  Exercise: Tai-Chi and walks that are shorter than they use to be, but walks a lot when he volunteers at [employer] within the year.  Visual Acuity Screening   Right eye Left eye Both eyes  Without correction:     With correction: 20/25 20/25 20/25   Advance Directives: He has living will, but isn't sure if it's here. He plans on faxing it  here.  Hyperlipidemia: Hx of elevated CPK and myalgias in his legs. Stopped statin medications due to this. Lab Results  Component Value Date   CHOL 180 10/29/2015   HDL 36* 10/29/2015   LDLCALC 125 10/29/2015   TRIG 97 10/29/2015   CHOLHDL 5.0 10/29/2015  Has been evaluated by rheumatology because of his elevated CPK by Dr. Charlestine Night  Leg pain: Myalgias in legs. See last visit in Feb. Worse pain with episodic weakness. Decreased sensations when sitting or crossing legs. 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 It appears that prednisone was called in for him yesterday, and he reports not taking them yet. He reports having a problem with prednisone. He reports having allergy to dye for upcoming epidural spinal injection with contrast. Epidural steroid injections scheduled for 4/24 Had LE doppler March 15 that was nl.  He expressed persistent leg pain two weeks ago while volunteering at the hospital. He says it hasn't radiated to his back.  Insomnia: Long standing issue takes 5 mg at bed time as needed. Has not had side affects or parasomnias. Is taking ambien, and expresses no side effects.  Patient Active Problem List   Diagnosis Date Noted  . Radicular pain 09/18/2013  . Hyperlipidemia 10/20/2011  . Insomnia 10/20/2011  . Prostate cancer ([place]) 10/20/2011  . Personal history of colonic polyps 12/16/2010   Past Medical History  Diagnosis Date  . BPH (benign prostatic hyperplasia)   .  Adenomatous polyps 10/2002, 06/2009  . Arthritis   . Prostate cancer ([place])   . Hyperlipemia    Past Surgical History  Procedure Laterality Date  . Cystoscopy  04/2010  . Retropubic prostatectomy  09/2003  . Hip arthroplasty  04/2010    Left - Dr. Mayer Camel, right 2012  . Colonoscopy w/ polypectomy  11/04/2002    Sigmoid adenomas x2, 1 cm maximum  . Esophagogastroduodenoscopy  11/04/2002    Duodenitis,  gastritis without H. pylori   . Colonoscopy w/ polypectomy  06/08/2009    6 polyps, largest 8 mm, at least 3 adenomas  . Joint replacement    . Prostate surgery    . Tonsillectomy    . Anterior cervical decomp/discectomy fusion N/A 09/18/2013    Procedure: Anterior cervical decompression fusion, cervical 4-5, cervical 5-6, cervical 6-7 with instrumentation and allograft;  Surgeon: Sinclair Ship, MD;  Location: [place] [facility];  Service: Orthopedics;  Laterality: N/A;  Anterior cervical decompression fusion, cervical 4-5, cervical 5-6, cervical 6-7 with instrumentation and allograft  . Colonoscopy    . Upper gastrointestinal endoscopy     Allergies  Allergen Reactions  . Iodine Anaphylaxis  . Ivp Dye [Iodinated Diagnostic Agents] Anaphylaxis   Prior to Admission medications   Medication Sig Start Date End Date Taking? Authorizing Provider  aspirin 81 MG tablet Take 81 mg by mouth daily.   Yes Historical Provider, MD  Fish Oil OIL by Does not apply route. 1 teaspoon daily   Yes Historical Provider, MD  Multiple Vitamin (MULTIVITAMIN) capsule Take 1 capsule by mouth daily.     Yes Historical Provider, MD  zolpidem (AMBIEN) 10 MG tablet Take 0.5 tablets (5 mg total) by mouth at bedtime as needed for sleep. 11/08/15  Yes [name], MD   Social History   Social History  . Marital Status: Married    Spouse Name: N/A  . Number of Children: N/A  . Years of Education: N/A   Occupational History  . Retired     [name]   Social History Main Topics  . Smoking status: Former Smoker    Types: Cigars    Quit date: 09/12/1972  . Smokeless tobacco: Never Used  . Alcohol Use: 1.2 oz/week    2 Cans of beer per week  . Drug Use: No  . Sexual Activity: Yes     Comment: number of sex partners in the last months 1, current birth control method - no prostate   Other Topics Concern  . Not on file   Social History Narrative   Exercise do Tai Chi daily and walking   Married    Education: Theme park manager; Yes    Review of Systems  Constitutional: Negative for fatigue and unexpected weight change.  Eyes: Negative for visual disturbance.  Respiratory: Negative for cough, chest tightness and shortness of breath.   Cardiovascular: Negative for chest pain, palpitations and leg swelling.  Gastrointestinal: Negative for abdominal pain and blood in stool.  Neurological: Negative for dizziness, light-headedness and headaches.     Objective:   Physical Exam  Constitutional: He is oriented to person, place, and time. He appears well-developed and well-nourished.  HENT:  Head: Normocephalic and atraumatic.  Right Ear: External ear normal.  Left Ear: External ear normal.  Mouth/Throat: Oropharynx is clear and moist.  Eyes: Conjunctivae and EOM are normal. Pupils are equal, round, and reactive to light.  Neck: Normal range of motion. Neck supple. No thyromegaly present.  Cardiovascular: Normal rate,  regular rhythm, normal heart sounds and intact distal pulses.   Pulmonary/Chest: Effort normal and breath sounds normal. No respiratory distress. He has no wheezes.  Abdominal: Soft. He exhibits no distension. There is no tenderness.  Musculoskeletal: Normal range of motion. He exhibits no edema or tenderness.  Lymphadenopathy:    He has no cervical adenopathy.  Neurological: He is alert and oriented to person, place, and time.  Reflex Scores:      Patellar reflexes are 2+ on the right side and 2+ on the left side.      Achilles reflexes are 1+ on the right side. Difficulty standing on toes with the left foot due to weakness. Decreased left achilles reflex.   Skin: Skin is warm and dry.  Psychiatric: He has a normal mood and affect. His behavior is normal.  Vitals reviewed.   Assessment & Plan:   JAIREN GOLDFARB is a 74 y.o. male Medicare annual wellness visit, subsequent  --anticipatory guidance as below in AVS, screening labs above. Health maintenance items  as above in HPI discussed/recommended as applicable.   Spinal stenosis of lumbar region - Plan: Ambulatory referral to Orthopedic Surgery  - Currently planning for ESI in few days. Also requested referral to spine surgeon in case this is not helping his symptoms, and has upcoming trip to [place]. Would like to discuss other options. Referral placed. Continue follow-up with rheumatology and agree with ESI as initial treatment, RTC precautions if weakness or return of symptoms as he experienced when walking to his car recently.  Hyperlipemia - Plan: COMPLETE METABOLIC PANEL WITH GFR, Lipid panel  -Recheck labs, but will hold on statin for now with previous intolerance and elevated CPK.  Insomnia  -Stable with use of Ambien. No parasomnias or new side effects. Continue to use lowest effective dose at 5 mg if possible.  Meds ordered this encounter  Medications  . predniSONE (DELTASONE) 50 MG tablet    Sig:     Refill:  0   Patient Instructions       IF you received an x-ray today, you will receive an invoice from [facility]. Please contact [facility] at [phone] with questions or concerns regarding your invoice.   IF you received labwork today, you will receive an invoice from [employer]. Please contact [facility] at [phone] with questions or concerns regarding your invoice.   Our billing staff will not be able to assist you with questions regarding bills from these companies.  You will be contacted with the lab results as soon as they are available. The fastest way to get your results is to activate your My Chart account. Instructions are located on the last page of this paperwork. If you have not heard from [place] regarding the results in 2 weeks, please contact this office.    Depending on your results form epidural spinal injection - discuss next step with Dr. Kellie Simmering, but I will also refer you to Dr. Yevette Edwards to discuss options prior  to [place].   Return in next week if possible for fasting lab visit only.   Return to the clinic or go to the nearest emergency room if any of your symptoms worsen or new symptoms occur.  Keeping you healthy  Get these tests  Blood pressure- Have your blood pressure checked once a year by your healthcare provider.  Normal blood pressure is 120/80  Weight- Have your body mass index (BMI) calculated to screen for obesity.  BMI is a measure of  body fat based on height and weight. You can also calculate your own BMI at ViewBanking.si.  Cholesterol- Have your cholesterol checked every year.  Diabetes- Have your blood sugar checked regularly if you have high blood pressure, high cholesterol, have a family history of diabetes or if you are overweight.  Screening for Colon Cancer- Colonoscopy starting at age 27.  Screening may begin sooner depending on your family history and other health conditions. Follow up colonoscopy as directed by your Gastroenterologist.  Screening for Prostate Cancer- Both blood work (PSA) and a rectal exam help screen for Prostate Cancer.  Screening begins at age 34 with African-American men and at age 59 with Caucasian men.  Screening may begin sooner depending on your family history.  Take these medicines  Aspirin- One aspirin daily can help prevent Heart disease and Stroke.  Flu shot- Every fall.  Tetanus- Every 10 years.  Zostavax- Once after the age of 6 to prevent Shingles.  Pneumonia shot- Once after the age of 79; if you are younger than 14, ask your healthcare provider if you need a Pneumonia shot.  Take these steps  Don't smoke- If you do smoke, talk to your doctor about quitting.  For tips on how to quit, go to www.smokefree.gov or call 1-800-QUIT-NOW.  Be physically active- Exercise 5 days a week for at least 30 minutes.  If you are not already physically active start slow and gradually work up to 30 minutes of moderate physical activity.   Examples of moderate activity include walking briskly, mowing the yard, dancing, swimming, bicycling, etc.  Eat a healthy diet- Eat a variety of healthy food such as fruits, vegetables, low fat milk, low fat cheese, yogurt, lean meant, poultry, fish, beans, tofu, etc. For more information go to www.thenutritionsource.org  Drink alcohol in moderation- Limit alcohol intake to less than two drinks a day. Never drink and drive.  Dentist- Brush and floss twice daily; visit your dentist twice a year.  Depression- Your emotional health is as important as your physical health. If you're feeling down, or losing interest in things you would normally enjoy please talk to your healthcare provider.  Eye exam- Visit your eye doctor every year.  Safe sex- If you may be exposed to a sexually transmitted infection, use a condom.  Seat belts- Seat belts can save your life; always wear one.  Smoke/Carbon Monoxide detectors- These detectors need to be installed on the appropriate level of your home.  Replace batteries at least once a year.  Skin cancer- When out in the sun, cover up and use sunscreen 15 SPF or higher.  Violence- If anyone is threatening you, please tell your healthcare provider.  Living Will/ Health care power of attorney- Speak with your healthcare provider and family.    I personally performed the services described in this documentation, which was scribed in my presence. The recorded information has been reviewed and considered, and addended by me as needed.

## 2016-01-03 ENCOUNTER — Ambulatory Visit
Admission: RE | Admit: 2016-01-03 | Discharge: 2016-01-03 | Disposition: A | Discharge status: Home / Self Care | Payer: Medicare Other | Source: Ambulatory Visit | Attending: Rheumatology | Admitting: Rheumatology

## 2016-01-03 DIAGNOSIS — M5416 Radiculopathy, lumbar region: Secondary | ICD-10-CM

## 2016-01-03 DIAGNOSIS — M48061 Spinal stenosis, lumbar region without neurogenic claudication: Secondary | ICD-10-CM

## 2016-01-03 DIAGNOSIS — IMO0002 Reserved for concepts with insufficient information to code with codable children: Secondary | ICD-10-CM

## 2016-01-03 MED ORDER — IOHEXOL 180 MG/ML  SOLN
1.0000 mL | Freq: Once | INTRAMUSCULAR | Status: AC | PRN
Start: 2016-01-03 — End: 2016-01-03
  Administered 2016-01-03: 1 mL via EPIDURAL

## 2016-01-03 MED ORDER — METHYLPREDNISOLONE ACETATE 40 MG/ML INJ SUSP (RADIOLOG
120.0000 mg | Freq: Once | INTRAMUSCULAR | Status: AC
  Administered 2016-01-03: 120 mg via EPIDURAL

## 2016-01-03 NOTE — Discharge Instructions (Signed)

## 2016-01-07 ENCOUNTER — Other Ambulatory Visit (INDEPENDENT_AMBULATORY_CARE_PROVIDER_SITE_OTHER): Payer: Medicare Other

## 2016-01-07 DIAGNOSIS — E785 Hyperlipidemia, unspecified: Secondary | ICD-10-CM

## 2016-01-07 LAB — COMPLETE METABOLIC PANEL WITH GFR
ALT: 27 U/L (ref 9–46)
AST: 18 U/L (ref 10–35)
Albumin: 4 g/dL (ref 3.6–5.1)
Alkaline Phosphatase: 49 U/L (ref 40–115)
BUN: 18 mg/dL (ref 7–25)
CO2: 24 mmol/L (ref 20–31)
Calcium: 8.9 mg/dL (ref 8.6–10.3)
Chloride: 108 mmol/L (ref 98–110)
Creat: 1.03 mg/dL (ref 0.70–1.18)
GFR, EST NON AFRICAN AMERICAN: 72 mL/min (ref >=60)
GFR, Est African American: 83 mL/min (ref >=60)
GLUCOSE: 81 mg/dL (ref 65–99)
POTASSIUM: 4 mmol/L (ref 3.5–5.3)
SODIUM: 141 mmol/L (ref 135–146)
TOTAL PROTEIN: 6.3 g/dL (ref 6.1–8.1)
Total Bilirubin: 0.6 mg/dL (ref 0.2–1.2)

## 2016-01-07 LAB — LIPID PANEL
CHOL/HDL RATIO: 4.6 ratio (ref <=5.0)
Cholesterol: 199 mg/dL (ref 125–200)
HDL: 43 mg/dL (ref >=40)
LDL CALC: 141 mg/dL — AB (ref <130)
Triglycerides: 74 mg/dL (ref <150)
VLDL: 15 mg/dL (ref <30)

## 2016-01-12 ENCOUNTER — Encounter: Payer: Medicare Other | Admitting: Family Medicine

## 2016-03-15 ENCOUNTER — Other Ambulatory Visit: Payer: Self-pay | Admitting: Orthopedic Surgery

## 2016-03-28 ENCOUNTER — Ambulatory Visit (HOSPITAL_COMMUNITY)
Admission: RE | Admit: 2016-03-28 | Discharge: 2016-03-28 | Disposition: A | Discharge status: Home / Self Care | Payer: Medicare Other | Source: Ambulatory Visit | Attending: Orthopedic Surgery | Admitting: Orthopedic Surgery

## 2016-03-28 ENCOUNTER — Encounter (HOSPITAL_COMMUNITY)
Admission: RE | Admit: 2016-03-28 | Discharge: 2016-03-28 | Disposition: A | Discharge status: Home / Self Care | Payer: Medicare Other | Source: Ambulatory Visit | Attending: Orthopedic Surgery | Admitting: Orthopedic Surgery

## 2016-03-28 ENCOUNTER — Encounter (HOSPITAL_COMMUNITY): Payer: Self-pay

## 2016-03-28 DIAGNOSIS — Z01818 Encounter for other preprocedural examination: Secondary | ICD-10-CM | POA: Diagnosis not present

## 2016-03-28 HISTORY — DX: Presence of spectacles and contact lenses: Z97.3

## 2016-03-28 HISTORY — DX: Spinal stenosis, site unspecified: M48.00

## 2016-03-28 LAB — CBC WITH DIFFERENTIAL/PLATELET
BASOS ABS: 0 10*3/uL (ref 0.0–0.1)
BASOS PCT: 1 %
EOS PCT: 5 %
Eosinophils Absolute: 0.3 10*3/uL (ref 0.0–0.7)
HEMATOCRIT: 37.4 % — AB (ref 39.0–52.0)
Hemoglobin: 12.1 g/dL — ABNORMAL LOW (ref 13.0–17.0)
LYMPHS PCT: 30 %
Lymphs Abs: 1.9 10*3/uL (ref 0.7–4.0)
MCH: 27.4 pg (ref 26.0–34.0)
MCHC: 32.4 g/dL (ref 30.0–36.0)
MCV: 84.8 fL (ref 78.0–100.0)
MONO ABS: 0.4 10*3/uL (ref 0.1–1.0)
Monocytes Relative: 7 %
NEUTROS ABS: 3.7 10*3/uL (ref 1.7–7.7)
Neutrophils Relative %: 57 %
PLATELETS: 210 10*3/uL (ref 150–400)
RBC: 4.41 MIL/uL (ref 4.22–5.81)
RDW: 14.2 % (ref 11.5–15.5)
WBC: 6.4 10*3/uL (ref 4.0–10.5)

## 2016-03-28 LAB — URINALYSIS, ROUTINE W REFLEX MICROSCOPIC
Bilirubin Urine: NEGATIVE
GLUCOSE, UA: NEGATIVE mg/dL
Hgb urine dipstick: NEGATIVE
Ketones, ur: NEGATIVE mg/dL
LEUKOCYTES UA: NEGATIVE
NITRITE: NEGATIVE
PH: 6.5 (ref 5.0–8.0)
Protein, ur: NEGATIVE mg/dL
SPECIFIC GRAVITY, URINE: 1.021 (ref 1.005–1.030)

## 2016-03-28 LAB — COMPREHENSIVE METABOLIC PANEL
ALBUMIN: 3.7 g/dL (ref 3.5–5.0)
ALT: 32 U/L (ref 17–63)
AST: 27 U/L (ref 15–41)
Alkaline Phosphatase: 50 U/L (ref 38–126)
Anion gap: 5 (ref 5–15)
BUN: 15 mg/dL (ref 6–20)
CHLORIDE: 108 mmol/L (ref 101–111)
CO2: 28 mmol/L (ref 22–32)
CREATININE: 1.03 mg/dL (ref 0.61–1.24)
Calcium: 9.2 mg/dL (ref 8.9–10.3)
GFR calc Af Amer: >60 mL/min (ref >60)
GFR calc non Af Amer: >60 mL/min (ref >60)
GLUCOSE: 88 mg/dL (ref 65–99)
POTASSIUM: 3.6 mmol/L (ref 3.5–5.1)
Sodium: 141 mmol/L (ref 135–145)
Total Bilirubin: 0.8 mg/dL (ref 0.3–1.2)
Total Protein: 6.4 g/dL — ABNORMAL LOW (ref 6.5–8.1)

## 2016-03-28 LAB — APTT: APTT: 31 s (ref 24–37)

## 2016-03-28 LAB — PROTIME-INR
INR: 1.13 (ref 0.00–1.49)
Prothrombin Time: 14.7 seconds (ref 11.6–15.2)

## 2016-03-28 LAB — SURGICAL PCR SCREEN
MRSA, PCR: POSITIVE — AB
Staphylococcus aureus: POSITIVE — AB

## 2016-03-28 NOTE — Pre-Procedure Instructions (Addendum)
HOKE FROMM  03/28/2016      [address], [place] [address] Phone: [phone] Fax: [phone]    Your procedure is scheduled on Thursday, March 30, 2016.  Report to [facility] Admitting at 9:00 A.M. ( per pt/per MD)  Call this number if you have problems the morning of surgery:  [phone]   Remember:  Do not eat food or drink liquids after midnight Wednesday, March 29, 2016  Take these medicines the morning of surgery with A SIP OF WATER : None  Stop taking Aspirin, vitamins, fish oil and herbal medications. Do not take any NSAIDs ie: Ibuprofen, Advil, Naproxen, BC and Goody Powder or any medication containing Aspirin; stop now  Do not wear jewelry, make-up or nail polish.  Do not wear lotions, powders, or perfumes.  You may not wear deoderant.  Do not shave 48 hours prior to surgery.  Men may shave face and neck.  Do not bring valuables to the hospital.  [facility] is not responsible for any belongings or valuables.  Contacts, dentures or bridgework may not be worn into surgery.  Leave your suitcase in the car.  After surgery it may be brought to your room.  For patients admitted to the hospital, discharge time will be determined by your treatment team.  Patients discharged the day of surgery will not be allowed to drive home.   Name and phone number of your driver:   Special instructions:  [place] - Preparing for Surgery  Before surgery, you can play an important role.  Because skin is not sterile, your skin needs to be as free of germs as possible.  You can reduce the number of germs on you skin by washing with CHG (chlorahexidine gluconate) soap before surgery.  CHG is an antiseptic cleaner which kills germs and bonds with the skin to continue killing germs even after washing.  Please DO NOT use if you have an allergy to CHG or antibacterial soaps.  If your skin  becomes reddened/irritated stop using the CHG and inform your nurse when you arrive at Short Stay.  Do not shave (including legs and underarms) for at least 48 hours prior to the first CHG shower.  You may shave your face.  Please follow these instructions carefully:   1.  Shower with CHG Soap the night before surgery and the morning of Surgery.  2.  If you choose to wash your hair, wash your hair first as usual with your normal shampoo.  3.  After you shampoo, rinse your hair and body thoroughly to remove the Shampoo.  4.  Use CHG as you would any other liquid soap.  You can apply chg directly  to the skin and wash gently with scrungie or a clean washcloth.  5.  Apply the CHG Soap to your body ONLY FROM THE NECK DOWN.  Do not use on open wounds or open sores.  Avoid contact with your eyes, ears, mouth and genitals (private parts).  Wash genitals (private parts) with your normal soap.  6.  Wash thoroughly, paying special attention to the area where your surgery will be performed.  7.  Thoroughly rinse your body with warm water from the neck down.  8.  DO NOT shower/wash with your normal soap after using and rinsing off the CHG Soap.  9.  Pat yourself dry with a clean towel.  10.  Wear clean pajamas.            11.  Place clean sheets on your bed the night of your first shower and do not sleep with pets.  Day of Surgery  Do not apply any lotions/deodorants the morning of surgery.  Please wear clean clothes to the hospital/surgery center.  Please read over the following fact sheets that you were given. Pain Booklet, Coughing and Deep Breathing, MRSA Information and Surgical Site Infection Prevention

## 2016-03-28 NOTE — Progress Notes (Signed)
Pt denies SOB, chest pain, and being under the care of a cardiologist. Pt stated that a stress test was performed >10 years ago but denies having a cardiac cath and echo. Pt denies having a chest x ray within the last year but stated that an EKG was done at [facility] ( not in Epic); records requested. Please treat pt with  Nasal Betadine per positive PCR protocol on DOS.

## 2016-03-29 NOTE — H&P (Signed)
PREOPERATIVE H&P  Chief Complaint: Bilateral leg pain  HPI: Donald [name] is a 75 y.o. male who presents with ongoing pain in the bilateral legs despite appropriate conservative care  MRI reveals L3-5 spinal stenosis, severe  Patient has failed multiple forms of conservative care and continues to have pain (see office notes for additional details regarding the patient's full course of treatment)  Past Medical History  Diagnosis Date  . BPH (benign prostatic hyperplasia)   . Adenomatous polyps 10/2002, 06/2009  . Arthritis   . Prostate cancer ([place])   . Hyperlipemia   . Spinal stenosis   . Wears glasses    Past Surgical History  Procedure Laterality Date  . Cystoscopy  04/2010  . Retropubic prostatectomy  09/2003  . Hip arthroplasty  04/2010    Left - Dr. Mayer Camel, right 2012  . Colonoscopy w/ polypectomy  11/04/2002    Sigmoid adenomas x2, 1 cm maximum  . Esophagogastroduodenoscopy  11/04/2002    Duodenitis, gastritis without H. pylori   . Colonoscopy w/ polypectomy  06/08/2009    6 polyps, largest 8 mm, at least 3 adenomas  . Joint replacement    . Prostate surgery    . Tonsillectomy    . Anterior cervical decomp/discectomy fusion N/A 09/18/2013    Procedure: Anterior cervical decompression fusion, cervical 4-5, cervical 5-6, cervical 6-7 with instrumentation and allograft;  Surgeon: Sinclair Ship, MD;  Location: [place];  Service: Orthopedics;  Laterality: N/A;  Anterior cervical decompression fusion, cervical 4-5, cervical 5-6, cervical 6-7 with instrumentation and allograft  . Colonoscopy    . Upper gastrointestinal endoscopy     Social History   Social History  . Marital Status: Married    Spouse Name: N/A  . Number of Children: N/A  . Years of Education: N/A   Occupational History  . Retired     [name]   Social History Main Topics  . Smoking status: Former Smoker    Types: Cigars    Quit date: 09/12/1972  . Smokeless tobacco: Never Used  .  Alcohol Use: 1.2 oz/week    2 Cans of beer per week     Comment: 1-2 beers a week  . Drug Use: No  . Sexual Activity: Yes     Comment: number of sex partners in the last months 1, current birth control method - no prostate   Other Topics Concern  . Not on file   Social History Narrative   Exercise do Tai Chi daily and walking   Married   Education: Theme park manager; Yes   Family History  Problem Relation Age of Onset  . Diabetes Mother   . Heart disease Mother   . Prostate cancer Father   . Colon cancer Neg Hx   . Stomach cancer Neg Hx   . Cancer Brother     prostate   Allergies  Allergen Reactions  . Iodine Anaphylaxis  . Ivp Dye [Iodinated Diagnostic Agents] Anaphylaxis   Prior to Admission medications   Medication Sig Start Date End Date Taking? Authorizing Provider  aspirin 81 MG tablet Take 81 mg by mouth daily.   Yes Historical Provider, MD  Fish Oil OIL by Does not apply route. 1 teaspoon daily   Yes Historical Provider, MD  Multiple Vitamin (MULTIVITAMIN) capsule Take 1 capsule by mouth daily.     Yes Historical Provider, MD  zolpidem (AMBIEN) 10 MG tablet Take 0.5 tablets (5 mg total) by mouth at bedtime  as needed for sleep. 11/08/15  Yes [name], MD     All other systems have been reviewed and were otherwise negative with the exception of those mentioned in the HPI and as above.  Physical Exam: There were no vitals filed for this visit.  General: Alert, no acute distress Cardiovascular: No pedal edema Respiratory: No cyanosis, no use of accessory musculature Skin: No lesions in the area of chief complaint Neurologic: Sensation intact distally Psychiatric: Patient is competent for consent with normal mood and affect Lymphatic: No axillary or cervical lymphadenopathy   Assessment/Plan: Spinal stenosis   Plan for Procedure(s): LUMBAR 3-4, LUMBAR 4-5 DECOMPRESSION    Sinclair Ship, MD 03/29/2016 8:10 AM

## 2016-03-30 ENCOUNTER — Encounter (HOSPITAL_COMMUNITY): Payer: Self-pay | Admitting: General Practice

## 2016-03-30 ENCOUNTER — Encounter (HOSPITAL_COMMUNITY)
Admission: RE | Disposition: A | Discharge status: Home / Self Care | Payer: Self-pay | Source: Ambulatory Visit | Attending: Orthopedic Surgery

## 2016-03-30 ENCOUNTER — Ambulatory Visit (HOSPITAL_COMMUNITY): Payer: Medicare Other | Admitting: Certified Registered Nurse Anesthetist

## 2016-03-30 ENCOUNTER — Ambulatory Visit (HOSPITAL_COMMUNITY)
Admission: RE | Admit: 2016-03-30 | Discharge: 2016-03-30 | Disposition: A | Discharge status: Home / Self Care | Payer: Medicare Other | Source: Ambulatory Visit | Attending: Orthopedic Surgery | Admitting: Orthopedic Surgery

## 2016-03-30 ENCOUNTER — Ambulatory Visit (HOSPITAL_COMMUNITY): Payer: Medicare Other

## 2016-03-30 DIAGNOSIS — Z419 Encounter for procedure for purposes other than remedying health state, unspecified: Secondary | ICD-10-CM

## 2016-03-30 DIAGNOSIS — Z7982 Long term (current) use of aspirin: Secondary | ICD-10-CM | POA: Insufficient documentation

## 2016-03-30 DIAGNOSIS — Z87891 Personal history of nicotine dependence: Secondary | ICD-10-CM | POA: Insufficient documentation

## 2016-03-30 DIAGNOSIS — Z8546 Personal history of malignant neoplasm of prostate: Secondary | ICD-10-CM | POA: Diagnosis not present

## 2016-03-30 DIAGNOSIS — M1990 Unspecified osteoarthritis, unspecified site: Secondary | ICD-10-CM | POA: Diagnosis not present

## 2016-03-30 DIAGNOSIS — E785 Hyperlipidemia, unspecified: Secondary | ICD-10-CM | POA: Insufficient documentation

## 2016-03-30 DIAGNOSIS — M4806 Spinal stenosis, lumbar region: Secondary | ICD-10-CM | POA: Insufficient documentation

## 2016-03-30 HISTORY — PX: LUMBAR LAMINECTOMY/DECOMPRESSION MICRODISCECTOMY: SHX5026

## 2016-03-30 SURGERY — LUMBAR LAMINECTOMY/DECOMPRESSION MICRODISCECTOMY
Anesthesia: General | Site: Back

## 2016-03-30 MED ORDER — FENTANYL CITRATE (PF) 250 MCG/5ML IJ SOLN
INTRAMUSCULAR | Status: AC
  Filled 2016-03-30: qty 5

## 2016-03-30 MED ORDER — THROMBIN 20000 UNITS EX SOLR
CUTANEOUS | Status: DC | PRN
  Administered 2016-03-30: 20 mL via TOPICAL

## 2016-03-30 MED ORDER — OXYCODONE HCL 5 MG/5ML PO SOLN: 5.0000 mg | Freq: Once | ORAL | Status: DC | PRN

## 2016-03-30 MED ORDER — PROPOFOL 10 MG/ML IV BOLUS
INTRAVENOUS | Status: DC | PRN
  Administered 2016-03-30: 150 mg via INTRAVENOUS

## 2016-03-30 MED ORDER — SUGAMMADEX SODIUM 200 MG/2ML IV SOLN
INTRAVENOUS | Status: DC | PRN
  Administered 2016-03-30: 210 mg via INTRAVENOUS

## 2016-03-30 MED ORDER — METHYLENE BLUE 0.5 % INJ SOLN
INTRAVENOUS | Status: AC
  Filled 2016-03-30: qty 10

## 2016-03-30 MED ORDER — KETOROLAC TROMETHAMINE 0.5 % OP SOLN
1.0000 [drp] | Freq: Three times a day (TID) | OPHTHALMIC | Status: DC | PRN
  Administered 2016-03-30: 1 [drp] via OPHTHALMIC
  Filled 2016-03-30: qty 3

## 2016-03-30 MED ORDER — 0.9 % SODIUM CHLORIDE (POUR BTL) OPTIME
TOPICAL | Status: DC | PRN
  Administered 2016-03-30 (×2): 1000 mL

## 2016-03-30 MED ORDER — BUPIVACAINE-EPINEPHRINE (PF) 0.25% -1:200000 IJ SOLN
INTRAMUSCULAR | Status: AC
  Filled 2016-03-30: qty 30

## 2016-03-30 MED ORDER — METHYLPREDNISOLONE ACETATE 40 MG/ML IJ SUSP
INTRAMUSCULAR | Status: DC | PRN
  Administered 2016-03-30: 40 mg

## 2016-03-30 MED ORDER — LIDOCAINE HCL (CARDIAC) 20 MG/ML IV SOLN
INTRAVENOUS | Status: DC | PRN
  Administered 2016-03-30: 100 mg via INTRAVENOUS

## 2016-03-30 MED ORDER — CEFAZOLIN SODIUM-DEXTROSE 2-4 GM/100ML-% IV SOLN
2.0000 g | INTRAVENOUS | Status: AC
  Administered 2016-03-30: 2 g via INTRAVENOUS

## 2016-03-30 MED ORDER — EPHEDRINE 5 MG/ML INJ
INTRAVENOUS | Status: AC
  Filled 2016-03-30: qty 10

## 2016-03-30 MED ORDER — BUPIVACAINE-EPINEPHRINE 0.25% -1:200000 IJ SOLN
INTRAMUSCULAR | Status: DC | PRN
Start: 2016-03-30 — End: 2016-03-30
  Administered 2016-03-30: 6 mL
  Administered 2016-03-30: 20 mL

## 2016-03-30 MED ORDER — LACTATED RINGERS IV SOLN
INTRAVENOUS | Status: DC
  Administered 2016-03-30 (×3): via INTRAVENOUS

## 2016-03-30 MED ORDER — PROMETHAZINE HCL 25 MG/ML IJ SOLN: 6.2500 mg | INTRAMUSCULAR | Status: DC | PRN

## 2016-03-30 MED ORDER — CHLORHEXIDINE GLUCONATE 4 % EX LIQD: 60.0000 mL | Freq: Once | CUTANEOUS | Status: DC

## 2016-03-30 MED ORDER — PROPOFOL 10 MG/ML IV BOLUS
INTRAVENOUS | Status: AC
  Filled 2016-03-30: qty 20

## 2016-03-30 MED ORDER — CEFAZOLIN SODIUM-DEXTROSE 2-4 GM/100ML-% IV SOLN
INTRAVENOUS | Status: AC
  Filled 2016-03-30: qty 100

## 2016-03-30 MED ORDER — DEXTROSE 5 % IV SOLN
10.0000 mg | INTRAVENOUS | Status: DC | PRN
  Administered 2016-03-30: 25 ug/min via INTRAVENOUS

## 2016-03-30 MED ORDER — HYDROMORPHONE HCL 1 MG/ML IJ SOLN
0.2500 mg | INTRAMUSCULAR | Status: DC | PRN
  Administered 2016-03-30: 0.5 mg via INTRAVENOUS

## 2016-03-30 MED ORDER — ROCURONIUM BROMIDE 50 MG/5ML IV SOLN
INTRAVENOUS | Status: AC
  Filled 2016-03-30: qty 1

## 2016-03-30 MED ORDER — METHYLPREDNISOLONE ACETATE 40 MG/ML IJ SUSP
INTRAMUSCULAR | Status: AC
  Filled 2016-03-30: qty 2

## 2016-03-30 MED ORDER — THROMBIN 20000 UNITS EX SOLR
CUTANEOUS | Status: AC
  Filled 2016-03-30: qty 20000

## 2016-03-30 MED ORDER — HYDROMORPHONE HCL 1 MG/ML IJ SOLN
INTRAMUSCULAR | Status: AC
  Filled 2016-03-30: qty 1

## 2016-03-30 MED ORDER — ONDANSETRON HCL 4 MG/2ML IJ SOLN
INTRAMUSCULAR | Status: DC | PRN
  Administered 2016-03-30: 4 mg via INTRAVENOUS

## 2016-03-30 MED ORDER — MIDAZOLAM HCL 2 MG/2ML IJ SOLN
INTRAMUSCULAR | Status: AC
  Filled 2016-03-30: qty 2

## 2016-03-30 MED ORDER — ROCURONIUM BROMIDE 100 MG/10ML IV SOLN
INTRAVENOUS | Status: DC | PRN
  Administered 2016-03-30: 10 mg via INTRAVENOUS
  Administered 2016-03-30: 50 mg via INTRAVENOUS

## 2016-03-30 MED ORDER — BSS IO SOLN
15.0000 mL | Freq: Once | INTRAOCULAR | Status: AC
  Administered 2016-03-30: 15 mL
  Filled 2016-03-30: qty 15

## 2016-03-30 MED ORDER — METHYLENE BLUE 0.5 % INJ SOLN
INTRAVENOUS | Status: DC | PRN
  Administered 2016-03-30: .5 mL via INTRADERMAL

## 2016-03-30 MED ORDER — ONDANSETRON HCL 4 MG/2ML IJ SOLN
INTRAMUSCULAR | Status: AC
  Filled 2016-03-30: qty 2

## 2016-03-30 MED ORDER — EPHEDRINE SULFATE 50 MG/ML IJ SOLN
INTRAMUSCULAR | Status: DC | PRN
  Administered 2016-03-30 (×3): 5 mg via INTRAVENOUS

## 2016-03-30 MED ORDER — OXYCODONE HCL 5 MG PO TABS: 5.0000 mg | ORAL_TABLET | Freq: Once | ORAL | Status: DC | PRN

## 2016-03-30 MED ORDER — MUPIROCIN 2 % EX OINT
TOPICAL_OINTMENT | CUTANEOUS | Status: AC
  Filled 2016-03-30: qty 22

## 2016-03-30 MED ORDER — FENTANYL CITRATE (PF) 100 MCG/2ML IJ SOLN
INTRAMUSCULAR | Status: DC | PRN
  Administered 2016-03-30 (×5): 25 ug via INTRAVENOUS
  Administered 2016-03-30: 50 ug via INTRAVENOUS
  Administered 2016-03-30 (×3): 25 ug via INTRAVENOUS
  Administered 2016-03-30: 100 ug via INTRAVENOUS
  Administered 2016-03-30 (×2): 25 ug via INTRAVENOUS

## 2016-03-30 SURGICAL SUPPLY — 68 items
BENZOIN TINCTURE PRP APPL 2/3 (GAUZE/BANDAGES/DRESSINGS) ×2 IMPLANT
BUR ROUND PRECISION 4.0 (BURR) ×2 IMPLANT
CANISTER SUCTION 2500CC (MISCELLANEOUS) ×2 IMPLANT
CARTRIDGE OIL MAESTRO DRILL (MISCELLANEOUS) ×1 IMPLANT
CORDS BIPOLAR (ELECTRODE) ×2 IMPLANT
COVER SURGICAL LIGHT HANDLE (MISCELLANEOUS) ×2 IMPLANT
DIFFUSER DRILL AIR PNEUMATIC (MISCELLANEOUS) ×2 IMPLANT
DRAIN CHANNEL 15F RND FF W/TCR (WOUND CARE) IMPLANT
DRAPE POUCH INSTRU U-SHP 10X18 (DRAPES) ×4 IMPLANT
DRAPE SURG 17X23 STRL (DRAPES) ×8 IMPLANT
DURAPREP 26ML APPLICATOR (WOUND CARE) ×2 IMPLANT
ELECT BLADE 4.0 EZ CLEAN MEGAD (MISCELLANEOUS) ×2
ELECT CAUTERY BLADE 6.4 (BLADE) ×2 IMPLANT
ELECT REM PT RETURN 9FT ADLT (ELECTROSURGICAL) ×2
ELECTRODE BLDE 4.0 EZ CLN MEGD (MISCELLANEOUS) ×1 IMPLANT
ELECTRODE REM PT RTRN 9FT ADLT (ELECTROSURGICAL) ×1 IMPLANT
EVACUATOR SILICONE 100CC (DRAIN) IMPLANT
FILTER STRAW FLUID ASPIR (MISCELLANEOUS) ×2 IMPLANT
GAUZE SPONGE 4X4 12PLY STRL (GAUZE/BANDAGES/DRESSINGS) ×2 IMPLANT
GAUZE SPONGE 4X4 16PLY XRAY LF (GAUZE/BANDAGES/DRESSINGS) ×4 IMPLANT
GLOVE BIO SURGEON STRL SZ7 (GLOVE) ×6 IMPLANT
GLOVE BIO SURGEON STRL SZ8 (GLOVE) ×2 IMPLANT
GLOVE BIOGEL PI IND STRL 7.0 (GLOVE) ×2 IMPLANT
GLOVE BIOGEL PI IND STRL 8 (GLOVE) ×1 IMPLANT
GLOVE BIOGEL PI INDICATOR 7.0 (GLOVE) ×2
GLOVE BIOGEL PI INDICATOR 8 (GLOVE) ×1
GOWN STRL REUS W/ TWL LRG LVL3 (GOWN DISPOSABLE) ×1 IMPLANT
GOWN STRL REUS W/ TWL XL LVL3 (GOWN DISPOSABLE) ×2 IMPLANT
GOWN STRL REUS W/TWL LRG LVL3 (GOWN DISPOSABLE) ×1
GOWN STRL REUS W/TWL XL LVL3 (GOWN DISPOSABLE) ×2
IV CATH 14GX2 1/4 (CATHETERS) ×2 IMPLANT
KIT BASIN OR (CUSTOM PROCEDURE TRAY) ×2 IMPLANT
KIT POSITION SURG JACKSON T1 (MISCELLANEOUS) ×2 IMPLANT
KIT ROOM TURNOVER OR (KITS) ×2 IMPLANT
NEEDLE 18GX1X1/2 (RX/OR ONLY) (NEEDLE) ×2 IMPLANT
NEEDLE 22X1 1/2 (OR ONLY) (NEEDLE) ×2 IMPLANT
NEEDLE HYPO 25GX1X1/2 BEV (NEEDLE) ×2 IMPLANT
NEEDLE SPNL 18GX3.5 QUINCKE PK (NEEDLE) ×4 IMPLANT
NS IRRIG 1000ML POUR BTL (IV SOLUTION) ×2 IMPLANT
OIL CARTRIDGE MAESTRO DRILL (MISCELLANEOUS) ×2
PACK LAMINECTOMY ORTHO (CUSTOM PROCEDURE TRAY) ×2 IMPLANT
PACK UNIVERSAL I (CUSTOM PROCEDURE TRAY) ×2 IMPLANT
PAD ARMBOARD 7.5X6 YLW CONV (MISCELLANEOUS) ×4 IMPLANT
PATTIES SURGICAL .5 X.5 (GAUZE/BANDAGES/DRESSINGS) IMPLANT
PATTIES SURGICAL .5 X1 (DISPOSABLE) ×2 IMPLANT
SPONGE INTESTINAL PEANUT (DISPOSABLE) ×2 IMPLANT
SPONGE LAP 4X18 X RAY DECT (DISPOSABLE) ×2 IMPLANT
SPONGE SURGIFOAM ABS GEL 100 (HEMOSTASIS) ×2 IMPLANT
SPONGE SURGIFOAM ABS GEL SZ50 (HEMOSTASIS) ×2 IMPLANT
STRIP CLOSURE SKIN 1/2X4 (GAUZE/BANDAGES/DRESSINGS) ×2 IMPLANT
SURGIFLO W/THROMBIN 8M KIT (HEMOSTASIS) ×2 IMPLANT
SUT MNCRL AB 4-0 PS2 18 (SUTURE) ×2 IMPLANT
SUT VIC AB 0 CT1 18XCR BRD 8 (SUTURE) ×1 IMPLANT
SUT VIC AB 0 CT1 27 (SUTURE)
SUT VIC AB 0 CT1 27XBRD ANBCTR (SUTURE) IMPLANT
SUT VIC AB 0 CT1 8-18 (SUTURE) ×1
SUT VIC AB 1 CT1 18XCR BRD 8 (SUTURE) ×2 IMPLANT
SUT VIC AB 1 CT1 8-18 (SUTURE) ×2
SUT VIC AB 2-0 CT2 18 VCP726D (SUTURE) ×2 IMPLANT
SYR 20CC LL (SYRINGE) ×2 IMPLANT
SYR BULB IRRIGATION 50ML (SYRINGE) ×2 IMPLANT
SYR CONTROL 10ML LL (SYRINGE) ×4 IMPLANT
SYR TB 1ML 26GX3/8 SAFETY (SYRINGE) ×4 IMPLANT
SYR TB 1ML LUER SLIP (SYRINGE) ×4 IMPLANT
TOWEL OR 17X24 6PK STRL BLUE (TOWEL DISPOSABLE) ×2 IMPLANT
TOWEL OR 17X26 10 PK STRL BLUE (TOWEL DISPOSABLE) ×2 IMPLANT
WATER STERILE IRR 1000ML POUR (IV SOLUTION) ×2 IMPLANT
YANKAUER SUCT BULB TIP NO VENT (SUCTIONS) ×2 IMPLANT

## 2016-03-30 NOTE — Anesthesia Preprocedure Evaluation (Addendum)
Anesthesia Evaluation  Patient identified by MRN, date of birth, ID band Patient awake    Reviewed: Allergy & Precautions, H&P , NPO status , Patient's Chart, lab work & pertinent test results  History of Anesthesia Complications Negative for: history of anesthetic complications  Airway Mallampati: II  TM Distance: >3 FB Neck ROM: full    Dental no notable dental hx. (+) Teeth Intact, Dental Advisory Given   Pulmonary former smoker,    Pulmonary exam normal breath sounds clear to auscultation       Cardiovascular negative cardio ROS Normal cardiovascular exam Rhythm:regular Rate:Normal     Neuro/Psych negative neurological ROS     GI/Hepatic negative GI ROS, Neg liver ROS,   Endo/Other  negative endocrine ROS  Renal/GU negative Renal ROS     Musculoskeletal  (+) Arthritis ,   Abdominal   Peds  Hematology negative hematology ROS (+)   Anesthesia Other Findings   Reproductive/Obstetrics negative OB ROS                            Anesthesia Physical Anesthesia Plan  ASA: II  Anesthesia Plan: General   Post-op Pain Management:    Induction: Intravenous  Airway Management Planned: Oral ETT  Additional Equipment:   Intra-op Plan:   Post-operative Plan: Extubation in OR  Informed Consent: I have reviewed the patients History and Physical, chart, labs and discussed the procedure including the risks, benefits and alternatives for the proposed anesthesia with the patient or authorized representative who has indicated his/her understanding and acceptance.   Dental Advisory Given and Dental advisory given  Plan Discussed with: Anesthesiologist, CRNA and Surgeon  Anesthesia Plan Comments:        Anesthesia Quick Evaluation

## 2016-03-30 NOTE — Progress Notes (Signed)
Per Dr. Kalman Shan okay to continue ketorolac eye drops if needed. L eye less red and patient continue to say it feels much better. Instructions also given for Mupirocin application.

## 2016-03-30 NOTE — Transfer of Care (Signed)
Immediate Anesthesia Transfer of Care Note  Patient: Donald [name]  Procedure(s) Performed: Procedure(s) with comments: LUMBAR 3-4, LUMBAR 4-5 DECOMPRESSION  (N/A) - LUMBAR 3-4, LUMBAR 4-5 DECOMPRESSION   Patient Location: PACU  Anesthesia Type:General  Level of Consciousness: awake and patient cooperative  Airway & Oxygen Therapy: Patient Spontanous Breathing and Patient connected to face mask oxygen  Post-op Assessment: Report given to RN and Post -op Vital signs reviewed and stable  Post vital signs: Reviewed and stable  Last Vitals:  Filed Vitals:   03/30/16 0906  BP: 137/68  Pulse: 55  Temp: 37.1 C  Resp: 18    Last Pain:  Filed Vitals:   03/30/16 1031  PainSc: 1       Patients Stated Pain Goal: 2 ([id number])  Complications: No apparent anesthesia complications

## 2016-03-30 NOTE — Anesthesia Procedure Notes (Signed)
Procedure Name: Intubation Date/Time: 03/30/2016 11:31 AM Performed by: Carney Living Pre-anesthesia Checklist: Patient identified, Emergency Drugs available, Suction available, Patient being monitored and Timeout performed Patient Re-evaluated:Patient Re-evaluated prior to inductionOxygen Delivery Method: Circle system utilized Preoxygenation: Pre-oxygenation with 100% oxygen Intubation Type: IV induction Ventilation: Mask ventilation without difficulty and Oral airway inserted - appropriate to patient size Laryngoscope Size: Glidescope and 4 Grade View: Grade I Tube size: 7.0 mm Number of attempts: 1 Airway Equipment and Method: Stylet and Video-laryngoscopy Placement Confirmation: ETT inserted through vocal cords under direct vision,  positive ETCO2 and breath sounds checked- equal and bilateral Secured at: 23 cm Tube secured with: Tape Dental Injury: Teeth and Oropharynx as per pre-operative assessment

## 2016-03-30 NOTE — Anesthesia Postprocedure Evaluation (Addendum)
Anesthesia Post Note  Patient: Donald [name]  Procedure(s) Performed: Procedure(s) (LRB): LUMBAR 3-4, LUMBAR 4-5 DECOMPRESSION  (N/A)  Patient location during evaluation: PACU Anesthesia Type: General Level of consciousness: awake and alert Pain management: pain level controlled Vital Signs Assessment: post-procedure vital signs reviewed and stable Respiratory status: spontaneous breathing, nonlabored ventilation, respiratory function stable and patient connected to nasal cannula oxygen Cardiovascular status: blood pressure returned to baseline and stable Postop Assessment: no signs of nausea or vomiting Anesthetic complications: yes Anesthetic complication details: injury of corneaComments: Post op corneal abrasion orders written for painful left eye, it is red.. Improved with medication.. Likely from lengthy prone positioning    Last Vitals:  Filed Vitals:   03/30/16 1738 03/30/16 1739  BP: 110/69   Pulse: 69 70  Temp:    Resp:  9    Last Pain:  Filed Vitals:   03/30/16 1745  PainSc: 1                  [name]

## 2016-03-30 NOTE — Progress Notes (Signed)
Patient claims L eye feels better after ketorolac ophthalmic solution 1 drop to left eye, remains red though.

## 2016-03-31 ENCOUNTER — Encounter (HOSPITAL_COMMUNITY): Payer: Self-pay | Admitting: Orthopedic Surgery

## 2016-03-31 NOTE — Op Note (Deleted)
NAME:  Donald [name], Donald [name] NO.:  [id number]  MEDICAL RECORD NO.:  [id number]  LOCATION:                               FACILITY:  [facility]  PHYSICIAN:  Phylliss Bob, MD      DATE OF BIRTH:  1941/05/18  DATE OF PROCEDURE:  03/30/2016                              OPERATIVE REPORT   PREOPERATIVE DIAGNOSES: 1. Severe spinal stenosis at L3-4, L4-5. 2. Bilateral leg pain, right greater than the left. 3. Bilateral leg numbness.  POSTOPERATIVE DIAGNOSES: 1. Severe spinal stenosis at L3-4, L4-5. 2. Bilateral leg pain, right greater than the left. 3. Bilateral leg numbness.  PROCEDURE:  Laminectomy with bilateral partial facetectomy and bilateral foraminotomy L3-4, L4-5.  SURGEON:  Phylliss Bob, MD  ASSISTANT:  Pricilla Holm, PAC.  ANESTHESIA:  General endotracheal anesthesia.  COMPLICATIONS:  None.  DISPOSITION:  Stable.  ESTIMATED BLOOD LOSS:  Minimal.  INDICATIONS FOR SURGERY:  Briefly, Mr. Melber is a pleasant 75 year old male, who did present to me with ongoing weakness, pain, and numbness in his bilateral legs, right greater than left.  His MRI did reveal severe stenosis at L3-4 and L4-5 with a right-sided L4-5 disk protrusion.  Given the patient's findings and ongoing symptoms, we did discuss proceeding with the procedure reflected above.  The patient was fully aware of the risks and limitations of surgery and did elect to proceed.  OPERATIVE DETAILS:  On March 30, 2016, the patient was brought to surgery and general endotracheal anesthesia was administered.  The patient was placed prone on a well-padded flat Jackson bed.  Antibiotics were given. The back was prepped and draped, and a time-out was performed.  A midline incision was then made overlying the L3-L5 levels.  The fascia was incised at the midline.  The spinous processes of L3 and L4 were removed.  I then used a number of Kerrison punches to thorough and meticulously perform a central and  bilateral lateral recess decompression.  The neural foramina were evaluated and also were decompressed appropriately.  I was very pleased with the decompression. Of note, there was very significant and very substantial facet hypertrophy noted bilaterally at L3-4 and L4-5.  I then evaluated the intervertebral disk herniation on the right side at L4-5.  With an assistant holding medial retraction of the traversing right L5 nerve, I did place downward pressure on the intervertebral disk herniation, and multiple fragments did clear themselves through the posterior longitudinal ligament.  These fragments were removed.  There were additional fragments found through the annulotomy defect created by the herniation.  These fragments were removed.  Using a series of straight and up-biting pituitary rongeurs.  In doing so, I was able to completely decompress the right lateral recess.  At the termination of the procedure, I was very pleased with the decompression bilaterally at both levels.  The wound was copiously irrigated.  A 40 mg of Depo-Medrol was then introduced about the epidural space.  The wound was then closed in layers using #1 Vicryl followed by 2-0 Vicryl, followed by 3-0 Monocryl. Benzoin and Steri-Strips were applied followed by sterile dressing.  All instrument counts were correct at the termination of the procedure.  Of  note, Pricilla Holm was my assistant throughout surgery, and did aid in retraction, suctioning, and closure from start to finish.     Phylliss Bob, MD   ______________________________ Phylliss Bob, MD    MD/MEDQ  D:  03/30/2016  T:  03/31/2016  Job:  [id number]  cc:   [name], M.D.

## 2016-03-31 NOTE — Op Note (Signed)
NAME:  Donald, [name] NO.:  [id number]  MEDICAL RECORD NO.:  [id number]  LOCATION:                               FACILITY:  [facility]  PHYSICIAN:  Phylliss Bob, MD      DATE OF BIRTH:  24-May-1941  DATE OF PROCEDURE:  03/30/2016                              OPERATIVE REPORT   PREOPERATIVE DIAGNOSES: 1. Severe spinal stenosis at L3-4, L4-5. 2. Bilateral leg pain, right greater than the left. 3. Bilateral leg numbness.  POSTOPERATIVE DIAGNOSES: 1. Severe spinal stenosis at L3-4, L4-5. 2. Bilateral leg pain, right greater than the left. 3. Bilateral leg numbness.  PROCEDURE:  Laminectomy with bilateral partial facetectomy and bilateral foraminotomy L3-4, L4-5.  SURGEON:  Phylliss Bob, MD  ASSISTANT:  Pricilla Holm, PAC.  ANESTHESIA:  General endotracheal anesthesia.  COMPLICATIONS:  None.  DISPOSITION:  Stable.  ESTIMATED BLOOD LOSS:  Minimal.  INDICATIONS FOR SURGERY:  Briefly, Donald [name] is a pleasant 75 year old male, who did present to me with ongoing weakness, pain, and numbness in his bilateral legs, right greater than left.  His MRI did reveal severe stenosis at L3-4 and L4-5 with a right-sided L4-5 disk protrusion.  Given the patient's findings and ongoing symptoms, we did discuss proceeding with the procedure reflected above.  The patient was fully aware of the risks and limitations of surgery and did elect to proceed.  OPERATIVE DETAILS:  On March 30, 2016, the patient was brought to surgery and general endotracheal anesthesia was administered.  The patient was placed prone on a well-padded flat Jackson bed.  Antibiotics were given. The back was prepped and draped, and a time-out was performed.  A midline incision was then made overlying the L3-L5 levels.  The fascia was incised at the midline.  The spinous processes of L3 and L4 were removed.  I then used a number of Kerrison punches to thorough and meticulously perform a central and  bilateral lateral recess decompression.  The neural foramina were evaluated and also were decompressed appropriately.  I was very pleased with the decompression. Of note, there was very significant and very substantial facet hypertrophy noted bilaterally at L3-4 and L4-5.  I then evaluated the intervertebral disk herniation on the right side at L4-5.  With an assistant holding medial retraction of the traversing right L5 nerve, I did place downward pressure on the intervertebral disk herniation, and multiple fragments did clear themselves through the posterior longitudinal ligament.  These fragments were removed.  There were additional fragments found through the annulotomy defect created by the herniation.  These fragments were removed.  Using a series of straight and up-biting pituitary rongeurs.  In doing so, I was able to completely decompress the right lateral recess.  At the termination of the procedure, I was very pleased with the decompression bilaterally at both levels.  The wound was copiously irrigated.  A 40 mg of Depo-Medrol was then introduced about the epidural space.  The wound was then closed in layers using #1 Vicryl followed by 2-0 Vicryl, followed by 3-0 Monocryl. Benzoin and Steri-Strips were applied followed by sterile dressing.  All instrument counts were correct at the termination of the procedure.  Of  note, Pricilla Holm was my assistant throughout surgery, and did aid in retraction, suctioning, and closure from start to finish.     Phylliss Bob, MD   ______________________________ Phylliss Bob, MD    MD/MEDQ  D:  03/30/2016  T:  03/31/2016  Job:  [id number]  cc:   [name], M.D.

## 2016-05-12 ENCOUNTER — Other Ambulatory Visit: Payer: Self-pay | Admitting: Family Medicine

## 2016-05-12 DIAGNOSIS — G4700 Insomnia, unspecified: Secondary | ICD-10-CM

## 2016-05-12 NOTE — Telephone Encounter (Signed)
Last Rx written 2/27 for #45 + 1 RF. Last OV in April.

## 2016-05-13 ENCOUNTER — Telehealth: Payer: Self-pay | Admitting: *Deleted

## 2016-05-13 NOTE — Telephone Encounter (Signed)
Faxed Rx Ambien 10 mg to patient's pharmacy, confirmation page received.

## 2016-05-16 ENCOUNTER — Ambulatory Visit (INDEPENDENT_AMBULATORY_CARE_PROVIDER_SITE_OTHER): Payer: Medicare Other | Admitting: Family Medicine

## 2016-05-16 ENCOUNTER — Encounter: Payer: Self-pay | Admitting: Family Medicine

## 2016-05-16 VITALS — BP 124/82 | HR 66 | Temp 98.4°F | Resp 16 | Ht 72.0 in | Wt 227.4 lb

## 2016-05-16 DIAGNOSIS — M545 Low back pain, unspecified: Secondary | ICD-10-CM

## 2016-05-16 DIAGNOSIS — K5901 Slow transit constipation: Secondary | ICD-10-CM | POA: Diagnosis not present

## 2016-05-16 DIAGNOSIS — G4700 Insomnia, unspecified: Secondary | ICD-10-CM | POA: Diagnosis not present

## 2016-05-16 DIAGNOSIS — R1031 Right lower quadrant pain: Secondary | ICD-10-CM | POA: Diagnosis not present

## 2016-05-16 LAB — POCT CBC
GRANULOCYTE PERCENT: 79.2 % (ref 37–80)
HCT, POC: 34.4 % — AB (ref 43.5–53.7)
HEMOGLOBIN: 11.6 g/dL — AB (ref 14.1–18.1)
Lymph, poc: 1.6 (ref 0.6–3.4)
MCH: 27 pg (ref 27–31.2)
MCHC: 33.8 g/dL (ref 31.8–35.4)
MCV: 79.7 fL — AB (ref 80–97)
MID (cbc): 0.4 (ref 0–0.9)
MPV: 5.9 fL (ref 0–99.8)
PLATELET COUNT, POC: 330 10*3/uL (ref 142–424)
POC Granulocyte: 7.4 — AB (ref 2–6.9)
POC LYMPH PERCENT: 16.5 %L (ref 10–50)
POC MID %: 4.3 %M (ref 0–12)
RBC: 4.31 M/uL — AB (ref 4.69–6.13)
RDW, POC: 14.3 %
WBC: 9.4 10*3/uL (ref 4.6–10.2)

## 2016-05-16 MED ORDER — ZOLPIDEM TARTRATE 10 MG PO TABS
ORAL_TABLET | ORAL | 0 refills | Status: DC
Start: 2016-05-16 — End: 2016-08-24

## 2016-05-16 NOTE — Addendum Note (Signed)
Addended by: [name] R on: 05/16/2016 05:25 PM   Modules accepted: Orders

## 2016-05-16 NOTE — Progress Notes (Addendum)
By signing my name below I, Tereasa Coop, attest that this documentation has been prepared under the direction and in the presence of [name], MD. Electonically Signed. Tereasa Coop, Scribe 05/16/2016 at 4:44 PM  Subjective:    Patient ID: Donald [name], male    DOB: [birth year], 75 y.o.   MRN: [id number]  Chief Complaint  Patient presents with  . Other    constipation, Rx refill  . Abdominal Pain    lower quadrant x2weeks    HPI Donald [name] is a 75 y.o. male who presents to the [facility] complaining of constipation for the past 2 weeks. Pt tried a fleets enema 10 days ago with mild relief of constipation. Pt also reports RLQ abd pain that he thinks is secondary to constipation. Pt's last BM was yesterday. Yesterday was pt's first BM in 4 days. Pt denies taking narcotic medications. Pt has prescription for narcotic pain medications from recent surgery. Pt states he does not take it because of fear of worsening his constipation. Pt denies any fever, chills, and N/V.  Hip pain Pt had surgery 03/30/16 for L3/4 and L4/5 lumbar decompression. Pt states he was doing excellent for the first 2 weeks after surgery, then pt started having pain in his hips. Pt denies any radiation of hip pain. Pt has been seen and evaluated by his surgeon 4 days ago who xrayed pt and stated pain was a musculosckelatal issue and referred pt to physical therapy that pt also started 4 days ago. Pt reports receiving massage therapy today which provided mild relief to pain.   History of prostate cancer with a prostatectomy in 2005.  Pt also has a history of HLD, insomnia, colon polyps. Pt's last colonoscopy was in March 2015.    Patient Active Problem List   Diagnosis Date Noted  . Radicular pain 09/18/2013  . Hyperlipidemia 10/20/2011  . Insomnia 10/20/2011  . Prostate cancer ([place]) 10/20/2011  . Personal history of colonic polyps 12/16/2010   Past Medical History:    Diagnosis Date  . Adenomatous polyps 10/2002, 06/2009  . Arthritis   . BPH (benign prostatic hyperplasia)   . Hyperlipemia   . Prostate cancer ([place])   . Spinal stenosis   . Wears glasses    Past Surgical History:  Procedure Laterality Date  . ANTERIOR CERVICAL DECOMP/DISCECTOMY FUSION N/A 09/18/2013   Procedure: Anterior cervical decompression fusion, cervical 4-5, cervical 5-6, cervical 6-7 with instrumentation and allograft;  Surgeon: Sinclair Ship, MD;  Location: [facility];  Service: Orthopedics;  Laterality: N/A;  Anterior cervical decompression fusion, cervical 4-5, cervical 5-6, cervical 6-7 with instrumentation and allograft  . COLONOSCOPY    . COLONOSCOPY W/ POLYPECTOMY  11/04/2002   Sigmoid adenomas x2, 1 cm maximum  . COLONOSCOPY W/ POLYPECTOMY  06/08/2009   6 polyps, largest 8 mm, at least 3 adenomas  . CYSTOSCOPY  04/2010  . ESOPHAGOGASTRODUODENOSCOPY  11/04/2002   Duodenitis, gastritis without H. pylori   . HIP ARTHROPLASTY  04/2010   Left - Dr. Mayer Camel, right 2012  . JOINT REPLACEMENT    . LUMBAR LAMINECTOMY/DECOMPRESSION MICRODISCECTOMY N/A 03/30/2016   Procedure: LUMBAR 3-4, LUMBAR 4-5 DECOMPRESSION ;  Surgeon: Phylliss Bob, MD;  Location: [facility];  Service: Orthopedics;  Laterality: N/A;  LUMBAR 3-4, LUMBAR 4-5 DECOMPRESSION   . PROSTATE SURGERY    . RETROPUBIC PROSTATECTOMY  09/2003  . TONSILLECTOMY    . UPPER GASTROINTESTINAL ENDOSCOPY     Allergies  Allergen  Reactions  . Iodine Anaphylaxis  . Ivp Dye [Iodinated Diagnostic Agents] Anaphylaxis   Prior to Admission medications   Medication Sig Start Date End Date Taking? Authorizing Provider  Multiple Vitamin (MULTIVITAMIN) capsule Take 1 capsule by mouth daily.     Yes Historical Provider, MD  zolpidem (AMBIEN) 10 MG tablet TAKE 1/2 TABLET AT BEDTIME AS NEEDED FOR SLEEP. 05/13/16  Yes [name], MD   Social History   Social History  . Marital status: Married    Spouse name: N/A  . Number of children:  N/A  . Years of education: N/A   Occupational History  . Retired     [name]   Social History Main Topics  . Smoking status: Former Smoker    Types: Cigars    Quit date: 09/12/1972  . Smokeless tobacco: Never Used  . Alcohol use 1.2 oz/week    2 Cans of beer per week     Comment: 1-2 beers a week  . Drug use: No  . Sexual activity: Yes     Comment: number of sex partners in the last months 1, current birth control method - no prostate   Other Topics Concern  . Not on file   Social History Narrative   Exercise do Tai Chi daily and walking   Married   Education: Theme park manager; Yes      Review of Systems  Constitutional: Negative for fever.  Gastrointestinal: Positive for abdominal pain (lower abd pain) and constipation. Negative for nausea and vomiting.  Musculoskeletal: Positive for arthralgias (bilat hip pain).  Neurological: Negative for weakness.       Objective:   Physical Exam  Constitutional: He is oriented to person, place, and time. He appears well-developed and well-nourished. No distress.  HENT:  Head: Normocephalic and atraumatic.  Eyes: Conjunctivae are normal. Pupils are equal, round, and reactive to light.  Neck: Neck supple.  Cardiovascular: Normal rate, regular rhythm and normal heart sounds.  Exam reveals no gallop and no friction rub.   No murmur heard. Pulmonary/Chest: Effort normal and breath sounds normal. He has no decreased breath sounds. He has no wheezes. He has no rhonchi. He has no rales.  Abdominal: There is tenderness. There is tenderness at McBurney's point (slight tenderness). There is negative Murphy's sign.  Musculoskeletal: Normal range of motion.  Pt is tender along SI joint to sciatic notch bilat.  Neurological: He is alert and oriented to person, place, and time.  Skin: Skin is warm and dry.  Psychiatric: He has a normal mood and affect. His behavior is normal.  Nursing note and vitals reviewed.    Vitals:    05/16/16 1602  BP: 124/82  Pulse: 66  Resp: 16  Temp: 98.4 F (36.9 C)  SpO2: 97%  Weight: 227 lb 6.4 oz (103.1 kg)  Height: 6' (1.829 m)   Results for orders placed or performed in visit on 05/16/16  POCT CBC  Result Value Ref Range   WBC 9.4 4.6 - 10.2 K/uL   Lymph, poc 1.6 0.6 - 3.4   POC LYMPH PERCENT 16.5 10 - 50 %L   MID (cbc) 0.4 0 - 0.9   POC MID % 4.3 0 - 12 %M   POC Granulocyte 7.4 (A) 2 - 6.9   Granulocyte percent 79.2 37 - 80 %G   RBC 4.31 (A) 4.69 - 6.13 M/uL   Hemoglobin 11.6 (A) 14.1 - 18.1 g/dL   HCT, POC 34.4 (A) 43.5 - 53.7 %  MCV 79.7 (A) 80 - 97 fL   MCH, POC 27.0 27 - 31.2 pg   MCHC 33.8 31.8 - 35.4 g/dL   RDW, POC 14.3 %   Platelet Count, POC 330 142 - 424 K/uL   MPV 5.9 0 - 99.8 fL         Assessment & Plan:   Donald [name] is a 75 y.o. male RLQ abdominal pain - Plan: POCT CBC Slow transit constipation  -Likely due to constipation, reassuring CBC. Advised if any increased pain, fevers, or not improving within the next 3 days, return for recheck.  -Colace as stool softener each day, MiraLAX if needed to help with bowel movements, and if impaction symptoms, Fleet enema 1 if needed RTC precautions.   Bilateral low back pain without sciatica  -Reportedly muscular pain as evaluated by his surgeon. Currently in physical therapy, but can try hydrocodone half pill initially as he has this medication at home. RTC precautions if persistent or worsening.  Insomnia. Stable. One half of Ambien as needed at bedtime. Refill provided today as previous prescription on September 2 and available.  No orders of the defined types were placed in this encounter.  Patient Instructions   Colace over-the-counter 1 per day can help as a stool softener, but MiraLAX up to once every day until bowel movements soften. If you feel there is a hard stool or fecal impaction, Fleet enema once may also help. Make sure you're drinking plenty of water and fiber in the  diet.  Continue follow-up with physical therapy for your back pain, and you could try one half of your hydrocodone if needed for improved pain control.  Your abdominal pain is likely due to constipation, as blood count here in the office was normal. If the abdominal pain is not improving with increased bowel movements, or persistent in 3 days, return for recheck. Sooner if worse.  Return to the clinic or go to the nearest emergency room if any of your symptoms worsen or new symptoms occur.  Constipation, Adult Constipation is when a person has fewer than three bowel movements a week, has difficulty having a bowel movement, or has stools that are dry, hard, or larger than normal. As people grow older, constipation is more common. A low-fiber diet, not taking in enough fluids, and taking certain medicines may make constipation worse.  CAUSES   Certain medicines, such as antidepressants, pain medicine, iron supplements, antacids, and water pills.   Certain diseases, such as diabetes, irritable bowel syndrome (IBS), thyroid disease, or depression.   Not drinking enough water.   Not eating enough fiber-rich foods.   Stress or travel.   Lack of physical activity or exercise.   Ignoring the urge to have a bowel movement.   Using laxatives too much.  SIGNS AND SYMPTOMS   Having fewer than three bowel movements a week.   Straining to have a bowel movement.   Having stools that are hard, dry, or larger than normal.   Feeling full or bloated.   Pain in the lower abdomen.   Not feeling relief after having a bowel movement.  DIAGNOSIS  Your health care provider will take a medical history and perform a physical exam. Further testing may be done for severe constipation. Some tests may include:  A barium enema X-ray to examine your rectum, colon, and, sometimes, your small intestine.   A sigmoidoscopy to examine your lower colon.   A colonoscopy to examine your entire  colon. TREATMENT  Treatment will depend on the severity of your constipation and what is causing it. Some dietary treatments include drinking more fluids and eating more fiber-rich foods. Lifestyle treatments may include regular exercise. If these diet and lifestyle recommendations do not help, your health care provider may recommend taking over-the-counter laxative medicines to help you have bowel movements. Prescription medicines may be prescribed if over-the-counter medicines do not work.  HOME CARE INSTRUCTIONS   Eat foods that have a lot of fiber, such as fruits, vegetables, whole grains, and beans.  Limit foods high in fat and processed sugars, such as french fries, hamburgers, cookies, candies, and soda.   A fiber supplement may be added to your diet if you cannot get enough fiber from foods.   Drink enough fluids to keep your urine clear or pale yellow.   Exercise regularly or as directed by your health care provider.   Go to the restroom when you have the urge to go. Do not hold it.   Only take over-the-counter or prescription medicines as directed by your health care provider. Do not take other medicines for constipation without talking to your health care provider first.  [place] IF:   You have bright red blood in your stool.   Your constipation lasts for more than 4 days or gets worse.   You have abdominal or rectal pain.   You have thin, pencil-like stools.   You have unexplained weight loss. MAKE SURE YOU:   Understand these instructions.  Will watch your condition.  Will get help right away if you are not doing well or get worse.   This information is not intended to replace advice given to you by your health care provider. Make sure you discuss any questions you have with your health care provider.   Document Released: 05/26/2004 Document Revised: 09/18/2014 Document Reviewed: 06/09/2013 Elsevier Interactive Patient Education 2016  [employer].   IF you received an x-ray today, you will receive an invoice from [facility]. Please contact [facility] at [phone] with questions or concerns regarding your invoice.   IF you received labwork today, you will receive an invoice from [employer]. Please contact [facility] at [phone] with questions or concerns regarding your invoice.   Our billing staff will not be able to assist you with questions regarding bills from these companies.  You will be contacted with the lab results as soon as they are available. The fastest way to get your results is to activate your My Chart account. Instructions are located on the last page of this paperwork. If you have not heard from [place] regarding the results in 2 weeks, please contact this office.          I personally performed the services described in this documentation, which was scribed in my presence. The recorded information has been reviewed and considered, and addended by me as needed.   Signed,   [name], MD [facility].  05/16/16 5:05 PM

## 2016-05-16 NOTE — Patient Instructions (Addendum)
Colace over-the-counter 1 per day can help as a stool softener, but MiraLAX up to once every day until bowel movements soften. If you feel there is a hard stool or fecal impaction, Fleet enema once may also help. Make sure you're drinking plenty of water and fiber in the diet.  Continue follow-up with physical therapy for your back pain, and you could try one half of your hydrocodone if needed for improved pain control.  Your abdominal pain is likely due to constipation, as blood count here in the office was normal. If the abdominal pain is not improving with increased bowel movements, or persistent in 3 days, return for recheck. Sooner if worse.  Return to the clinic or go to the nearest emergency room if any of your symptoms worsen or new symptoms occur.  Constipation, Adult Constipation is when a person has fewer than three bowel movements a week, has difficulty having a bowel movement, or has stools that are dry, hard, or larger than normal. As people grow older, constipation is more common. A low-fiber diet, not taking in enough fluids, and taking certain medicines may make constipation worse.  CAUSES   Certain medicines, such as antidepressants, pain medicine, iron supplements, antacids, and water pills.   Certain diseases, such as diabetes, irritable bowel syndrome (IBS), thyroid disease, or depression.   Not drinking enough water.   Not eating enough fiber-rich foods.   Stress or travel.   Lack of physical activity or exercise.   Ignoring the urge to have a bowel movement.   Using laxatives too much.  SIGNS AND SYMPTOMS   Having fewer than three bowel movements a week.   Straining to have a bowel movement.   Having stools that are hard, dry, or larger than normal.   Feeling full or bloated.   Pain in the lower abdomen.   Not feeling relief after having a bowel movement.  DIAGNOSIS  Your health care provider will take a medical history and perform a  physical exam. Further testing may be done for severe constipation. Some tests may include:  A barium enema X-ray to examine your rectum, colon, and, sometimes, your small intestine.   A sigmoidoscopy to examine your lower colon.   A colonoscopy to examine your entire colon. TREATMENT  Treatment will depend on the severity of your constipation and what is causing it. Some dietary treatments include drinking more fluids and eating more fiber-rich foods. Lifestyle treatments may include regular exercise. If these diet and lifestyle recommendations do not help, your health care provider may recommend taking over-the-counter laxative medicines to help you have bowel movements. Prescription medicines may be prescribed if over-the-counter medicines do not work.  HOME CARE INSTRUCTIONS   Eat foods that have a lot of fiber, such as fruits, vegetables, whole grains, and beans.  Limit foods high in fat and processed sugars, such as french fries, hamburgers, cookies, candies, and soda.   A fiber supplement may be added to your diet if you cannot get enough fiber from foods.   Drink enough fluids to keep your urine clear or pale yellow.   Exercise regularly or as directed by your health care provider.   Go to the restroom when you have the urge to go. Do not hold it.   Only take over-the-counter or prescription medicines as directed by your health care provider. Do not take other medicines for constipation without talking to your health care provider first.  [place] IF:   You have  bright red blood in your stool.   Your constipation lasts for more than 4 days or gets worse.   You have abdominal or rectal pain.   You have thin, pencil-like stools.   You have unexplained weight loss. MAKE SURE YOU:   Understand these instructions.  Will watch your condition.  Will get help right away if you are not doing well or get worse.   This information is not intended  to replace advice given to you by your health care provider. Make sure you discuss any questions you have with your health care provider.   Document Released: 05/26/2004 Document Revised: 09/18/2014 Document Reviewed: 06/09/2013 Elsevier Interactive Patient Education 2016 [employer].   IF you received an x-ray today, you will receive an invoice from [facility]. Please contact [facility] at [phone] with questions or concerns regarding your invoice.   IF you received labwork today, you will receive an invoice from [employer]. Please contact [facility] at [phone] with questions or concerns regarding your invoice.   Our billing staff will not be able to assist you with questions regarding bills from these companies.  You will be contacted with the lab results as soon as they are available. The fastest way to get your results is to activate your My Chart account. Instructions are located on the last page of this paperwork. If you have not heard from [place] regarding the results in 2 weeks, please contact this office.

## 2016-05-23 ENCOUNTER — Other Ambulatory Visit (HOSPITAL_COMMUNITY): Payer: Self-pay | Admitting: Orthopedic Surgery

## 2016-05-23 DIAGNOSIS — M5416 Radiculopathy, lumbar region: Secondary | ICD-10-CM

## 2016-05-29 ENCOUNTER — Ambulatory Visit (HOSPITAL_COMMUNITY)
Admission: RE | Admit: 2016-05-29 | Discharge: 2016-05-29 | Disposition: A | Discharge status: Home / Self Care | Payer: Medicare Other | Source: Ambulatory Visit | Attending: Orthopedic Surgery | Admitting: Orthopedic Surgery

## 2016-05-29 DIAGNOSIS — M5416 Radiculopathy, lumbar region: Secondary | ICD-10-CM | POA: Diagnosis not present

## 2016-05-29 DIAGNOSIS — M4806 Spinal stenosis, lumbar region: Secondary | ICD-10-CM | POA: Insufficient documentation

## 2016-05-29 DIAGNOSIS — R600 Localized edema: Secondary | ICD-10-CM | POA: Insufficient documentation

## 2016-05-29 DIAGNOSIS — Z9889 Other specified postprocedural states: Secondary | ICD-10-CM | POA: Insufficient documentation

## 2016-05-29 LAB — CREATININE, SERUM
CREATININE: 0.94 mg/dL (ref 0.61–1.24)
GFR calc Af Amer: >60 mL/min (ref >60)

## 2016-05-29 MED ORDER — GADOBENATE DIMEGLUMINE 529 MG/ML IV SOLN
20.0000 mL | Freq: Once | INTRAVENOUS | Status: AC | PRN
  Administered 2016-05-29: 20 mL via INTRAVENOUS

## 2016-06-12 ENCOUNTER — Ambulatory Visit (INDEPENDENT_AMBULATORY_CARE_PROVIDER_SITE_OTHER): Payer: Medicare Other | Admitting: Family Medicine

## 2016-06-12 VITALS — BP 126/80 | HR 66 | Temp 98.8°F | Resp 17 | Ht 71.5 in | Wt 222.0 lb

## 2016-06-12 DIAGNOSIS — Z23 Encounter for immunization: Secondary | ICD-10-CM | POA: Diagnosis not present

## 2016-06-12 DIAGNOSIS — M545 Low back pain, unspecified: Secondary | ICD-10-CM

## 2016-06-12 DIAGNOSIS — M791 Myalgia: Secondary | ICD-10-CM | POA: Diagnosis not present

## 2016-06-12 DIAGNOSIS — M7918 Myalgia, other site: Secondary | ICD-10-CM

## 2016-06-12 DIAGNOSIS — R700 Elevated erythrocyte sedimentation rate: Secondary | ICD-10-CM

## 2016-06-12 LAB — POCT CBC
Granulocyte percent: 75.4 %G (ref 37–80)
HCT, POC: 32.7 % — AB (ref 43.5–53.7)
Hemoglobin: 11 g/dL — AB (ref 14.1–18.1)
LYMPH, POC: 1.3 (ref 0.6–3.4)
MCH, POC: 25.5 pg — AB (ref 27–31.2)
MCHC: 33.6 g/dL (ref 31.8–35.4)
MCV: 76.1 fL — AB (ref 80–97)
MID (CBC): 0.5 (ref 0–0.9)
MPV: 5.7 fL (ref 0–99.8)
POC Granulocyte: 5.6 (ref 2–6.9)
POC LYMPH %: 17.4 % (ref 10–50)
POC MID %: 7.2 % (ref 0–12)
Platelet Count, POC: 366 10*3/uL (ref 142–424)
RBC: 4.3 M/uL — AB (ref 4.69–6.13)
RDW, POC: 15 %
WBC: 7.4 10*3/uL (ref 4.6–10.2)

## 2016-06-12 LAB — POCT SEDIMENTATION RATE: POCT SED RATE: 65 mm/hr — AB (ref 0–22)

## 2016-06-12 MED ORDER — HYDROCODONE-ACETAMINOPHEN 5-325 MG PO TABS: 1.0000 | ORAL_TABLET | Freq: Four times a day (QID) | ORAL | 0 refills | Status: DC | PRN

## 2016-06-12 NOTE — Progress Notes (Signed)
By signing my name below, I, [name], attest that this documentation has been prepared under the direction and in the presence of [name], MD.  Electronically Signed: [name], Medical Scribe. 06/12/16. 12:39 PM.  Subjective:    Patient ID: Donald [name], male    DOB: 05/10/1941, 75 y.o.   MRN: [id number]  HPI  Chief Complaint  Patient presents with  . Follow-up    post back surgery   . Immunizations    FLU shot     HPI Comments: Donald [name] is a 75 y.o. male who presents to the [facility] for post back surgery follow-up. Pt under went L3-4, and L4-5 decompression July 20th by [name]. He was seen in follow-up with me Sept 5th for persistent lower back pain; thought to be muscular. When discussed with his surgeon, planned for continuous PT and hydrocodone if needed. Of note, he was treated for suspected constipation at last visit.  Pt reports "upper buttock pain" and a tight back. Pt has been wearing his brace, and taking 1 hydrocodone at a time every 5-6 hours, TID to QID, depending on the severity of his pain. Pt can lay down, but getting up is hard and painful. Pt has a routine to get out of bed and he's getting better at it. Pt mentions his back is always been stiff when he gets out of the car and reports episodic rt groin pain. Pt mentions he can walk and he's compliant with PT. Pt was told by PT "if it hurts, don't do it". Pt occasionally goes to the pool and wears his belt to walk in the water. Pt was told surgery wasn't necessary after the 2nd MRI was done after the surgery. Pt plans on going to [facility] in 3 days for a wedding next week and he can get through [facility] in his condition. Pt is driving there, and mentions that is the most comfortable place he knows. Pt would like to get a 2nd opinion. Pt mentions the muscle relaxer didn't work for him. Pt denies having a painful recovery, but states it was very inconvenient. Pt denies fever,  chills, and night sweats.  Constipation: Pt is still having constipation. Pt has been using stool softener colace, but hasn't been taking miralax.  Immunizations: Pt would like to get his flu vaccine. Immunization History  Administered Date(s) Administered  . Influenza Split 07/03/2011, 06/25/2012  . Influenza,inj,Quad PF,36+ Mos 06/05/2013, 05/25/2014  . Influenza-Unspecified 06/12/2015  . Pneumococcal Conjugate-13 04/09/2015  . Pneumococcal Polysaccharide-23 10/20/2011  . Tdap 03/12/2007  . Zoster 12/03/2012   Lumbar Spine W Wo Contrast  Result Date: 05/29/2016 CLINICAL DATA:  Lumbar radiculopathy.  Back surgery 03/30/2016 EXAM: MRI LUMBAR SPINE WITHOUT AND WITH CONTRAST TECHNIQUE: Multiplanar and multiecho pulse sequences of the lumbar spine were obtained without and with intravenous contrast. CONTRAST:  105m MULTIHANCE GADOBENATE DIMEGLUMINE 529 MG/ML IV SOLN COMPARISON:  MRI 12/25/2015 FINDINGS: Segmentation:  Normal Alignment:  Normal Vertebrae: Bilateral laminectomy L3-4 and L4-5. Extensive endplate edema and enhancement at L4-5 most likely related to postop change. No definite endplate destruction. Infection cannot be excluded. Negative for fracture or mass lesion. Small hemangioma L1 vertebral body. Conus medullaris: Extends to the L1 level and appears normal. Paraspinal and other soft tissues: Postop edema in the posterior soft tissues related to laminectomy at L3-4 and L4-5. Soft tissues show diffuse enhancement. 1 cm fluid collection posterior to the dura on the right at the L4-5 level most  likely a postop fluid collection. Small abscess cannot be excluded based on imaging alone. No epidural abscess. Disc levels: L1-2: Disc degeneration with disc bulging and spurring right greater than left. Subarticular stenosis on the right with mild spinal stenosis L2-3: Moderate facet and ligamentum flavum hypertrophy. Mild disc degeneration. Moderate spinal stenosis unchanged from the prior study.  L3-4: Diffuse bulging of the disc with spurring. Posterior decompression without spinal stenosis. Neural foramina adequately patent L4-5: Postop discectomy and posterior decompression. Mild spinal stenosis is markedly improved from the prior study. Moderate foraminal encroachment bilaterally due to facet hypertrophy and endplate spurring. L5-S1: Mild disc and facet degeneration. Mild foraminal narrowing bilaterally due to spurring. IMPRESSION: Mild spinal stenosis L1-2 unchanged Moderate spinal stenosis L2-3 unchanged Posterior decompression at L3-4 and L4-5 with improvement in spinal stenosis since the prior study. Extensive bone marrow edema and enhancement at L4-5 most likely related to discectomy change. Discitis and osteomyelitis cannot be excluded. 1 cm fluid collection posterior to the dura on the right at L4-5 could represent an abscess versus postoperative fluid. Correlate with laboratory studies for infection. Electronically Signed   By: Franchot Gallo M.D.   On: 05/29/2016 16:21   Patient Active Problem List   Diagnosis Date Noted  . Radicular pain 09/18/2013  . Hyperlipidemia 10/20/2011  . Insomnia 10/20/2011  . Prostate cancer ([place]) 10/20/2011  . Personal history of colonic polyps 12/16/2010   Past Medical History:  Diagnosis Date  . Adenomatous polyps 10/2002, 06/2009  . Arthritis   . BPH (benign prostatic hyperplasia)   . Hyperlipemia   . Prostate cancer ([place])   . Spinal stenosis   . Wears glasses    Past Surgical History:  Procedure Laterality Date  . ANTERIOR CERVICAL DECOMP/DISCECTOMY FUSION N/A 09/18/2013   Procedure: Anterior cervical decompression fusion, cervical 4-5, cervical 5-6, cervical 6-7 with instrumentation and allograft;  Surgeon: Sinclair Ship, MD;  Location: [facility];  Service: Orthopedics;  Laterality: N/A;  Anterior cervical decompression fusion, cervical 4-5, cervical 5-6, cervical 6-7 with instrumentation and allograft  . COLONOSCOPY    . COLONOSCOPY W/  POLYPECTOMY  11/04/2002   Sigmoid adenomas x2, 1 cm maximum  . COLONOSCOPY W/ POLYPECTOMY  06/08/2009   6 polyps, largest 8 mm, at least 3 adenomas  . CYSTOSCOPY  04/2010  . ESOPHAGOGASTRODUODENOSCOPY  11/04/2002   Duodenitis, gastritis without H. pylori   . HIP ARTHROPLASTY  04/2010   Left - Dr. Mayer Camel, right 2012  . JOINT REPLACEMENT    . LUMBAR LAMINECTOMY/DECOMPRESSION MICRODISCECTOMY N/A 03/30/2016   Procedure: LUMBAR 3-4, LUMBAR 4-5 DECOMPRESSION ;  Surgeon: Phylliss Bob, MD;  Location: [facility];  Service: Orthopedics;  Laterality: N/A;  LUMBAR 3-4, LUMBAR 4-5 DECOMPRESSION   . PROSTATE SURGERY    . RETROPUBIC PROSTATECTOMY  09/2003  . TONSILLECTOMY    . UPPER GASTROINTESTINAL ENDOSCOPY     Allergies  Allergen Reactions  . Iodine Anaphylaxis  . Ivp Dye [Iodinated Diagnostic Agents] Anaphylaxis   Prior to Admission medications   Medication Sig Start Date End Date Taking? Authorizing Provider  HYDROcodone-acetaminophen (NORCO) 7.5-325 MG tablet Take 1 tablet by mouth every 6 (six) hours as needed for moderate pain.   Yes Historical Provider, MD  Multiple Vitamin (MULTIVITAMIN) capsule Take 1 capsule by mouth daily.     Yes Historical Provider, MD  zolpidem (AMBIEN) 10 MG tablet TAKE 1/2 TABLET AT BEDTIME AS NEEDED FOR SLEEP. 05/16/16  Yes [name], MD   Social History   Social History  .  Marital status: Married    Spouse name: N/A  . Number of children: N/A  . Years of education: N/A   Occupational History  . Retired     [name]   Social History Main Topics  . Smoking status: Former Smoker    Types: Cigars    Quit date: 09/12/1972  . Smokeless tobacco: Never Used  . Alcohol use 1.2 oz/week    2 Cans of beer per week     Comment: 1-2 beers a week  . Drug use: No  . Sexual activity: Yes     Comment: number of sex partners in the last months 1, current birth control method - no prostate   Other Topics Concern  . Not on file   Social History Narrative    Exercise do Tai Chi daily and walking   Married   Education: Theme park manager; Yes   Depression screen [facility] 2/9 06/12/2016 05/16/2016 12/29/2015 09/28/2015 05/22/2015  Decreased Interest 0 0 0 0 0  Down, Depressed, Hopeless 0 - 0 0 0  PHQ - 2 Score 0 0 0 0 0   Review of Systems  Constitutional: Positive for activity change. Negative for chills, diaphoresis and fever.  Gastrointestinal: Positive for constipation.  Musculoskeletal: Positive for back pain.    Objective:  Physical Exam  Constitutional: He appears well-developed and well-nourished. No distress.  HENT:  Head: Normocephalic and atraumatic.  Eyes: Conjunctivae are normal.  Neck: Neck supple.  Cardiovascular: Normal rate, regular rhythm and normal heart sounds.  Exam reveals no gallop and no friction rub.   No murmur heard. Pulmonary/Chest: Effort normal and breath sounds normal. No respiratory distress. He has no wheezes. He has no rales.  Musculoskeletal:  Well healed scar on his l-spine: No induration, No surrounding tenderness No pain in the rt hip with piriformis stretch No pain with internal or external rotation of rt hip No pain with resisted hip extention Tenderness of the rt SI joint as well as rt upper gluteus along with rt paraspinals  Neurological: He is alert.  Skin: Skin is warm and dry.  Psychiatric: He has a normal mood and affect. His behavior is normal.  Nursing note and vitals reviewed.  BP 126/80 (BP Location: Right Arm, Patient Position: Sitting, Cuff Size: Normal)   Pulse 66   Temp 98.8 F (37.1 C) (Oral)   Resp 17   Ht 5' 11.5" (1.816 m)   Wt 222 lb (100.7 kg)   SpO2 96%   BMI 30.53 kg/m    Results for orders placed or performed in visit on 06/12/16  POCT CBC  Result Value Ref Range   WBC 7.4 4.6 - 10.2 K/uL   Lymph, poc 1.3 0.6 - 3.4   POC LYMPH PERCENT 17.4 10 - 50 %L   MID (cbc) 0.5 0 - 0.9   POC MID % 7.2 0 - 12 %M   POC Granulocyte 5.6 2 - 6.9   Granulocyte percent 75.4 37 - 80 %G     RBC 4.30 (A) 4.69 - 6.13 M/uL   Hemoglobin 11.0 (A) 14.1 - 18.1 g/dL   HCT, POC 32.7 (A) 43.5 - 53.7 %   MCV 76.1 (A) 80 - 97 fL   MCH, POC 25.5 (A) 27 - 31.2 pg   MCHC 33.6 31.8 - 35.4 g/dL   RDW, POC 15.0 %   Platelet Count, POC 366 142 - 424 K/uL   MPV 5.7 0 - 99.8 fL  POCT SEDIMENTATION RATE  Result  Value Ref Range   POCT SED RATE 65 (A) 0 - 22 mm/hr    Assessment & Plan:   Donald [name] is a 75 y.o. male Acute right-sided low back pain without sciatica - Right buttock pain - Plan: HYDROcodone-acetaminophen (NORCO/VICODIN) 5-325 MG tablet, POCT CBC, POCT SEDIMENTATION RATE, CANCELED: Sedimentation Rate  - prior lumbar decompression with persistent right-sided buttock/paraspinal low back pain. Recent MRI from his back surgeon was reviewed with some persistent fluid that was thought to be normal from previous surgery on review of [name].  Afebrile, WBCs normal, but slightly elevated sedimentation rate which may or may not be specific to infection as some inflammation still possible.  -Will discuss next step with his spine surgeon as still significant pain.  Possible second opinion on discussion with patient, but will discuss with his surgeon first. Continue medications as previously prescribed.  Flu vaccine need - Plan: Flu Vaccine QUAD 36+ mos IM - flu vaccine given.    Meds ordered this encounter  Medications  . DISCONTD: HYDROcodone-acetaminophen (NORCO) 7.5-325 MG tablet    Sig: Take 1 tablet by mouth every 6 (six) hours as needed for moderate pain.  Marland Kitchen HYDROcodone-acetaminophen (NORCO/VICODIN) 5-325 MG tablet    Sig: Take 1 tablet by mouth every 6 (six) hours as needed for moderate pain.    Dispense:  20 tablet    Refill:  0   Patient Instructions   I will check inflammation test, and if that is elevated, other imaging may be needed prior to your trip to [place]. Otherwise, we'll plan on referring you to other back specialist for second opinion. Continue  hydrocodone if needed for pain, heat or ice and gentle stretches as tolerated, other recommendations per physical therapy.  Return to the clinic or go to the nearest emergency room if any of your symptoms worsen or new symptoms occur.     IF you received an x-ray today, you will receive an invoice from [facility]. Please contact [facility] at [phone] with questions or concerns regarding your invoice.   IF you received labwork today, you will receive an invoice from [employer]. Please contact [facility] at [phone] with questions or concerns regarding your invoice.   Our billing staff will not be able to assist you with questions regarding bills from these companies.  You will be contacted with the lab results as soon as they are available. The fastest way to get your results is to activate your My Chart account. Instructions are located on the last page of this paperwork. If you have not heard from [place] regarding the results in 2 weeks, please contact this office.        I personally performed the services described in this documentation, which was scribed in my presence. The recorded information has been reviewed and considered, and addended by me as needed.   Signed,   [name], MD [facility].  06/14/16 12:10 PM

## 2016-06-12 NOTE — Patient Instructions (Addendum)
I will check inflammation test, and if that is elevated, other imaging may be needed prior to your trip to [place]. Otherwise, we'll plan on referring you to other back specialist for second opinion. Continue hydrocodone if needed for pain, heat or ice and gentle stretches as tolerated, other recommendations per physical therapy.  Return to the clinic or go to the nearest emergency room if any of your symptoms worsen or new symptoms occur.     IF you received an x-ray today, you will receive an invoice from [facility]. Please contact [facility] at [phone] with questions or concerns regarding your invoice.   IF you received labwork today, you will receive an invoice from [employer]. Please contact [facility] at [phone] with questions or concerns regarding your invoice.   Our billing staff will not be able to assist you with questions regarding bills from these companies.  You will be contacted with the lab results as soon as they are available. The fastest way to get your results is to activate your My Chart account. Instructions are located on the last page of this paperwork. If you have not heard from [place] regarding the results in 2 weeks, please contact this office.

## 2016-06-23 ENCOUNTER — Other Ambulatory Visit: Payer: Self-pay | Admitting: Orthopedic Surgery

## 2016-06-23 DIAGNOSIS — M5416 Radiculopathy, lumbar region: Secondary | ICD-10-CM

## 2016-06-26 ENCOUNTER — Ambulatory Visit (INDEPENDENT_AMBULATORY_CARE_PROVIDER_SITE_OTHER): Payer: Medicare Other | Admitting: Family Medicine

## 2016-06-26 VITALS — BP 120/72 | HR 68 | Temp 98.1°F | Resp 16 | Ht 72.0 in | Wt 222.0 lb

## 2016-06-26 DIAGNOSIS — M79605 Pain in left leg: Secondary | ICD-10-CM | POA: Diagnosis not present

## 2016-06-26 DIAGNOSIS — R700 Elevated erythrocyte sedimentation rate: Secondary | ICD-10-CM | POA: Diagnosis not present

## 2016-06-26 DIAGNOSIS — M545 Low back pain: Secondary | ICD-10-CM

## 2016-06-26 DIAGNOSIS — R351 Nocturia: Secondary | ICD-10-CM

## 2016-06-26 DIAGNOSIS — R630 Anorexia: Secondary | ICD-10-CM | POA: Diagnosis not present

## 2016-06-26 DIAGNOSIS — M79604 Pain in right leg: Secondary | ICD-10-CM

## 2016-06-26 DIAGNOSIS — R5383 Other fatigue: Secondary | ICD-10-CM | POA: Diagnosis not present

## 2016-06-26 LAB — POCT URINALYSIS DIP (MANUAL ENTRY)
Blood, UA: NEGATIVE
Glucose, UA: NEGATIVE
Leukocytes, UA: NEGATIVE
NITRITE UA: NEGATIVE
SPEC GRAV UA: 1.02
UROBILINOGEN UA: 0.2
pH, UA: 7

## 2016-06-26 LAB — POCT CBC
GRANULOCYTE PERCENT: 77.4 % (ref 37–80)
HCT, POC: 34.6 % — AB (ref 43.5–53.7)
Hemoglobin: 11.2 g/dL — AB (ref 14.1–18.1)
LYMPH, POC: 1.5 (ref 0.6–3.4)
MCH, POC: 24.3 pg — AB (ref 27–31.2)
MCHC: 32.3 g/dL (ref 31.8–35.4)
MCV: 75.3 fL — AB (ref 80–97)
MID (cbc): 0.6 (ref 0–0.9)
MPV: 5.7 fL (ref 0–99.8)
PLATELET COUNT, POC: 465 10*3/uL — AB (ref 142–424)
POC Granulocyte: 7.1 — AB (ref 2–6.9)
POC LYMPH %: 16.2 % (ref 10–50)
POC MID %: 6.4 %M (ref 0–12)
RBC: 4.59 M/uL — AB (ref 4.69–6.13)
RDW, POC: 15 %
WBC: 9.2 10*3/uL (ref 4.6–10.2)

## 2016-06-26 LAB — POCT SEDIMENTATION RATE: POCT SED RATE: 75 mm/h — AB (ref 0–22)

## 2016-06-26 LAB — POC MICROSCOPIC URINALYSIS (UMFC): Mucus: ABSENT

## 2016-06-26 NOTE — Patient Instructions (Addendum)
You can increase methocarbamol up to 3 times per day. If needed for pain, can take 1-2 of the 5 mg hydrocodone up to every 6 hours. I will discuss your symptoms and try to get you into a neurosurgeon for another opinion. If you have any worsening of pain, unable to get into a comfortable position, proceed to the emergency room as we discussed.  Make sure you're drinking plenty of fluids, and plan on repeating urine tests in the next 2 weeks. Sooner if any change of nighttime urination. If those symptoms persist, urology evaluation may be needed. Return to the clinic or go to the nearest emergency room if any of your symptoms worsen or new symptoms occur.    IF you received an x-ray today, you will receive an invoice from [facility]. Please contact [facility] at [phone] with questions or concerns regarding your invoice.   IF you received labwork today, you will receive an invoice from [employer]. Please contact [facility] at [phone] with questions or concerns regarding your invoice.   Our billing staff will not be able to assist you with questions regarding bills from these companies.  You will be contacted with the lab results as soon as they are available. The fastest way to get your results is to activate your My Chart account. Instructions are located on the last page of this paperwork. If you have not heard from [place] regarding the results in 2 weeks, please contact this office.

## 2016-06-26 NOTE — Progress Notes (Addendum)
Subjective:  By signing my name below, I, [name], attest that this documentation has been prepared under the direction and in the presence of [name], MD. Electronically Signed: [name], [place]. 06/26/2016 , 12:46 PM .  Patient was seen in Room 7 .   Patient ID: Donald [name], male    DOB: [birth year], 75 y.o.   MRN: [id number] Chief Complaint  Patient presents with  . Back Pain   HPI Donald [name] is a 75 y.o. male  Patient was pulled back emergently. I went to the waiting room to see the patient, who was in a wheelchair. He states he was very uncomfortable and in a lot of pain. He wanted to lay down.  [12:18PM] He was brought back emergently into room 7.  He is here for follow up of back pain, L3-4 and L4-5 decompression on July 20th by [name]. He has had persistent and increase in back-to-buttock pain. He was most recently seen by me on Oct 2nd. Discussed with his back surgeon and most recent MRI was on Sept 18th without any apparent signs of pain from surgical site. He has small amount of fluid thought to be post-operative. He did have a sed rate that was elevated at 65 at his visit on Oct 2nd. But, he had normal white blood cell count and afebrile at that visit. When discussed with his back surgeon, he planned on having follow up for next step.   [12:22PM] He felt better when laying down. He states he's had worsening pain for weeks and continually worsening. He informs "getting to the clinic was a big ordeal". He's been having spasms of shooting pain going down his legs, worse in the right, and down into his shins. To alleviate this, he bends his leg up, and cross his right leg over his left leg. He's tried massage therapy, acupuncture and rolfing. He's had some relief with rolfing. He's also taken 1 tablet of hydrocodone with relief, 5~6 times a day. He reports nocturia about 4~5 times ever since surgery; history of prostatectomy in Jan 2005. He also  mentions intermittent abdominal pain since his surgery. He denies fever or nausea.   He had blood work done 3 days ago. He also rescheduled MRI set up in 4 days. When he was sitting in lobby for 20 minutes, it was causing him a lot of pain. He has worsened pain compared to previous visit on Oct 2nd. He's had very little appetite and drinking only some water.   He is able to get into a position of comfort with plan on his left side and bending the knees slightly.  Has been taking 5-6 hydrocodone per day, methocarbamol just twice per day.  Wt Readings from Last 3 Encounters:  06/26/16 222 lb (100.7 kg)  06/12/16 222 lb (100.7 kg)  05/16/16 227 lb 6.4 oz (103.1 kg)   Patient Active Problem List   Diagnosis Date Noted  . Radicular pain 09/18/2013  . Hyperlipidemia 10/20/2011  . Insomnia 10/20/2011  . Prostate cancer ([place]) 10/20/2011  . Personal history of colonic polyps 12/16/2010   Past Medical History:  Diagnosis Date  . Adenomatous polyps 10/2002, 06/2009  . Arthritis   . BPH (benign prostatic hyperplasia)   . Hyperlipemia   . Prostate cancer ([place])   . Spinal stenosis   . Wears glasses    Past Surgical History:  Procedure Laterality Date  . ANTERIOR CERVICAL DECOMP/DISCECTOMY FUSION N/A 09/18/2013   Procedure: Anterior cervical decompression  fusion, cervical 4-5, cervical 5-6, cervical 6-7 with instrumentation and allograft;  Surgeon: Sinclair Ship, MD;  Location: [facility];  Service: Orthopedics;  Laterality: N/A;  Anterior cervical decompression fusion, cervical 4-5, cervical 5-6, cervical 6-7 with instrumentation and allograft  . COLONOSCOPY    . COLONOSCOPY W/ POLYPECTOMY  11/04/2002   Sigmoid adenomas x2, 1 cm maximum  . COLONOSCOPY W/ POLYPECTOMY  06/08/2009   6 polyps, largest 8 mm, at least 3 adenomas  . CYSTOSCOPY  04/2010  . ESOPHAGOGASTRODUODENOSCOPY  11/04/2002   Duodenitis, gastritis without H. pylori   . HIP ARTHROPLASTY  04/2010   Left - Dr. Mayer Camel, right 2012    . JOINT REPLACEMENT    . LUMBAR LAMINECTOMY/DECOMPRESSION MICRODISCECTOMY N/A 03/30/2016   Procedure: LUMBAR 3-4, LUMBAR 4-5 DECOMPRESSION ;  Surgeon: Phylliss Bob, MD;  Location: [facility];  Service: Orthopedics;  Laterality: N/A;  LUMBAR 3-4, LUMBAR 4-5 DECOMPRESSION   . PROSTATE SURGERY    . RETROPUBIC PROSTATECTOMY  09/2003  . TONSILLECTOMY    . UPPER GASTROINTESTINAL ENDOSCOPY     Allergies  Allergen Reactions  . Iodine Anaphylaxis  . Ivp Dye [Iodinated Diagnostic Agents] Anaphylaxis   Prior to Admission medications   Medication Sig Start Date End Date Taking? Authorizing Provider  HYDROcodone-acetaminophen (NORCO/VICODIN) 5-325 MG tablet Take 1 tablet by mouth every 6 (six) hours as needed for moderate pain. 06/12/16   [name], MD  Multiple Vitamin (MULTIVITAMIN) capsule Take 1 capsule by mouth daily.      Historical Provider, MD  zolpidem (AMBIEN) 10 MG tablet TAKE 1/2 TABLET AT BEDTIME AS NEEDED FOR SLEEP. 05/16/16   [name], MD   Social History   Social History  . Marital status: Married    Spouse name: N/A  . Number of children: N/A  . Years of education: N/A   Occupational History  . Retired     [name]   Social History Main Topics  . Smoking status: Former Smoker    Types: Cigars    Quit date: 09/12/1972  . Smokeless tobacco: Never Used  . Alcohol use 1.2 oz/week    2 Cans of beer per week     Comment: 1-2 beers a week  . Drug use: No  . Sexual activity: Yes     Comment: number of sex partners in the last months 1, current birth control method - no prostate   Other Topics Concern  . Not on file   Social History Narrative   Exercise do Tai Chi daily and walking   Married   Education: Theme park manager; Yes   Review of Systems  Constitutional: Positive for appetite change. Negative for fatigue, fever and unexpected weight change.  Eyes: Negative for visual disturbance.  Respiratory: Negative for cough, chest tightness and shortness of  breath.   Cardiovascular: Negative for chest pain, palpitations and leg swelling.  Gastrointestinal: Negative for abdominal pain, blood in stool and nausea.  Genitourinary: Positive for frequency.  Musculoskeletal: Positive for back pain, gait problem and myalgias. Negative for arthralgias.  Skin: Negative for rash and wound.  Neurological: Negative for dizziness, light-headedness and headaches.       Objective:   Physical Exam  Constitutional: He is oriented to person, place, and time. He appears well-developed and well-nourished. No distress.  HENT:  Head: Normocephalic and atraumatic.  Eyes: EOM are normal. Pupils are equal, round, and reactive to light.  Neck: Neck supple.  Cardiovascular: Normal rate, regular rhythm and normal heart  sounds.   No murmur heard. Cap refills 1 second at finger tips, pulses intact  Pulmonary/Chest: Effort normal and breath sounds normal. No respiratory distress.  Abdominal: Soft. Bowel sounds are normal. He exhibits no distension. There is no tenderness.  Musculoskeletal:  Able to extend and flexion lower extremities without pain or difficulty  Neurological: He is alert and oriented to person, place, and time.  Unable to obtain patellar reflexes, but some difficulty with positioning and tested with patient laying on his side. Achilles 2+ bilaterally  Skin: Skin is warm and dry.  Psychiatric: He has a normal mood and affect. His behavior is normal.  Nursing note and vitals reviewed.   Vitals:   06/26/16 1220  BP: 120/72  Pulse: 68  Resp: 16  Temp: 98.1 F (36.7 C)  TempSrc: Oral  SpO2: 96%  Weight: 222 lb (100.7 kg)  Height: 6' (1.829 m)   [12:25PM] BP recheck in room, laying supine (large cuff, right arm): 120/50  Results for orders placed or performed in visit on 06/26/16  POCT CBC  Result Value Ref Range   WBC 9.2 4.6 - 10.2 K/uL   Lymph, poc 1.5 0.6 - 3.4   POC LYMPH PERCENT 16.2 10 - 50 %L   MID (cbc) 0.6 0 - 0.9   POC MID %  6.4 0 - 12 %M   POC Granulocyte 7.1 (A) 2 - 6.9   Granulocyte percent 77.4 37 - 80 %G   RBC 4.59 (A) 4.69 - 6.13 M/uL   Hemoglobin 11.2 (A) 14.1 - 18.1 g/dL   HCT, POC 31.2 (A) 81.1 - 53.7 %   MCV 75.3 (A) 80 - 97 fL   MCH, POC 24.3 (A) 27 - 31.2 pg   MCHC 32.3 31.8 - 35.4 g/dL   RDW, POC 88.6 %   Platelet Count, POC 465 (A) 142 - 424 K/uL   MPV 5.7 0 - 99.8 fL  POCT urinalysis dipstick  Result Value Ref Range   Color, UA yellow yellow   Clarity, UA cloudy (A) clear   Glucose, UA negative negative   Bilirubin, UA small (A) negative   Ketones, POC UA trace (5) (A) negative   Spec Grav, UA 1.020    Blood, UA negative negative   pH, UA 7.0    Protein Ur, POC =30 (A) negative   Urobilinogen, UA 0.2    Nitrite, UA Negative Negative   Leukocytes, UA Negative Negative  POCT Microscopic Urinalysis ([facility])  Result Value Ref Range   WBC,UR,HPF,POC None None WBC/hpf   RBC,UR,HPF,POC None None RBC/hpf   Bacteria None None, Too numerous to count   Mucus Absent Absent   Epithelial Cells, UR Per Microscopy None None, Too numerous to count cells/hpf      Assessment & Plan:   Donald [name] is a 75 y.o. male Leg pain, bilateral  Acute low back pain, unspecified back pain laterality, with sciatica presence unspecified  Other fatigue - Plan: POCT CBC, POCT SEDIMENTATION RATE  Decreased appetite  Elevated sed rate - Plan: POCT SEDIMENTATION RATE  Nocturia - Plan: POCT urinalysis dipstick, POCT Microscopic Urinalysis ([facility])  Prior spinal surgery, persistent intermittent shooting leg pains, see prior note. Previous MRI with small fluid collection but thought to be postoperative. Sedimentation rate elevated last visit, but afebrile and was not experiencing a much back pain as pain into legs. His sx's were discussed with his surgeon, planned for follow-up. Had labs drawn and repeat MRI planned.   -CBC without leukocytosis,  and afebrile, initially concerning due to pain experienced in  waiting room but able to get comfortable once in exam room.  Discussed ER eval, but with above labs, decided on outpatient treatment initially, repeat ESR, determine labs from ortho, and consult with surgeon.    - called Dr. Laurena Bering office yesterday afternoon. Spoke to other provider in his absence regarding labwork ordered at their office.  Sed rate in their office from 10/13 was 47, CRP 123, WBC on CBC 8.7. MRI pending Friday, but left message to discuss patient with [name] and plan today or Friday.   - planned to call pt this afternoon to check status/discuss these results and plan, but noted in [facility]  he has been admitted after ER eval today and MRI suggestive of discitis and osteomyelitis, with surgery today at 5pm. Will follow from notes in [facility] and check on status next few days.   Meds ordered this encounter  Medications  . methocarbamol (ROBAXIN) 500 MG tablet    Sig: Take 500 mg by mouth 3 (three) times daily.    Patient Instructions   You can increase methocarbamol up to 3 times per day. If needed for pain, can take 1-2 of the 5 mg hydrocodone up to every 6 hours. I will discuss your symptoms and try to get you into a neurosurgeon for another opinion. If you have any worsening of pain, unable to get into a comfortable position, proceed to the emergency room as we discussed.  Make sure you're drinking plenty of fluids, and plan on repeating urine tests in the next 2 weeks. Sooner if any change of nighttime urination. If those symptoms persist, urology evaluation may be needed. Return to the clinic or go to the nearest emergency room if any of your symptoms worsen or new symptoms occur.    IF you received an x-ray today, you will receive an invoice from [facility]. Please contact [facility] at [phone] with questions or concerns regarding your invoice.   IF you received labwork today, you will receive an invoice from [employer].  Please contact [facility] at [phone] with questions or concerns regarding your invoice.   Our billing staff will not be able to assist you with questions regarding bills from these companies.  You will be contacted with the lab results as soon as they are available. The fastest way to get your results is to activate your My Chart account. Instructions are located on the last page of this paperwork. If you have not heard from [place] regarding the results in 2 weeks, please contact this office.        I personally performed the services described in this documentation, which was scribed in my presence. The recorded information has been reviewed and considered, and addended by me as needed.   Signed,   [name], MD [facility].  06/29/16 7:10 PM

## 2016-06-29 ENCOUNTER — Observation Stay (HOSPITAL_COMMUNITY): Payer: Medicare Other | Admitting: Anesthesiology

## 2016-06-29 ENCOUNTER — Encounter (HOSPITAL_COMMUNITY)
Admission: EM | Disposition: A | Discharge status: Home Health | Payer: Self-pay | Source: Home / Self Care | Attending: Internal Medicine

## 2016-06-29 ENCOUNTER — Encounter (HOSPITAL_COMMUNITY): Payer: Self-pay | Admitting: Emergency Medicine

## 2016-06-29 ENCOUNTER — Emergency Department (HOSPITAL_COMMUNITY): Payer: Medicare Other

## 2016-06-29 ENCOUNTER — Inpatient Hospital Stay (HOSPITAL_COMMUNITY)
Admission: EM | Admit: 2016-06-29 | Discharge: 2016-07-03 | DRG: 518 | Disposition: A | Discharge status: Home Health | Payer: Medicare Other | Attending: Internal Medicine | Admitting: Internal Medicine

## 2016-06-29 DIAGNOSIS — Z9079 Acquired absence of other genital organ(s): Secondary | ICD-10-CM

## 2016-06-29 DIAGNOSIS — Z79899 Other long term (current) drug therapy: Secondary | ICD-10-CM | POA: Diagnosis not present

## 2016-06-29 DIAGNOSIS — Z981 Arthrodesis status: Secondary | ICD-10-CM

## 2016-06-29 DIAGNOSIS — M4626 Osteomyelitis of vertebra, lumbar region: Secondary | ICD-10-CM | POA: Diagnosis present

## 2016-06-29 DIAGNOSIS — M79605 Pain in left leg: Secondary | ICD-10-CM

## 2016-06-29 DIAGNOSIS — M79604 Pain in right leg: Secondary | ICD-10-CM | POA: Insufficient documentation

## 2016-06-29 DIAGNOSIS — M792 Neuralgia and neuritis, unspecified: Secondary | ICD-10-CM | POA: Diagnosis not present

## 2016-06-29 DIAGNOSIS — Z8546 Personal history of malignant neoplasm of prostate: Secondary | ICD-10-CM | POA: Diagnosis not present

## 2016-06-29 DIAGNOSIS — M1990 Unspecified osteoarthritis, unspecified site: Secondary | ICD-10-CM | POA: Diagnosis present

## 2016-06-29 DIAGNOSIS — M7918 Myalgia, other site: Secondary | ICD-10-CM

## 2016-06-29 DIAGNOSIS — N400 Enlarged prostate without lower urinary tract symptoms: Secondary | ICD-10-CM | POA: Diagnosis present

## 2016-06-29 DIAGNOSIS — D638 Anemia in other chronic diseases classified elsewhere: Secondary | ICD-10-CM | POA: Diagnosis present

## 2016-06-29 DIAGNOSIS — G062 Extradural and subdural abscess, unspecified: Secondary | ICD-10-CM

## 2016-06-29 DIAGNOSIS — M4640 Discitis, unspecified, site unspecified: Secondary | ICD-10-CM

## 2016-06-29 DIAGNOSIS — A498 Other bacterial infections of unspecified site: Secondary | ICD-10-CM

## 2016-06-29 DIAGNOSIS — E785 Hyperlipidemia, unspecified: Secondary | ICD-10-CM | POA: Diagnosis present

## 2016-06-29 DIAGNOSIS — M48061 Spinal stenosis, lumbar region without neurogenic claudication: Secondary | ICD-10-CM | POA: Diagnosis present

## 2016-06-29 DIAGNOSIS — M4646 Discitis, unspecified, lumbar region: Secondary | ICD-10-CM | POA: Diagnosis present

## 2016-06-29 DIAGNOSIS — Z87891 Personal history of nicotine dependence: Secondary | ICD-10-CM

## 2016-06-29 DIAGNOSIS — G061 Intraspinal abscess and granuloma: Secondary | ICD-10-CM | POA: Diagnosis not present

## 2016-06-29 DIAGNOSIS — B9689 Other specified bacterial agents as the cause of diseases classified elsewhere: Secondary | ICD-10-CM | POA: Diagnosis not present

## 2016-06-29 DIAGNOSIS — M545 Low back pain, unspecified: Secondary | ICD-10-CM

## 2016-06-29 DIAGNOSIS — M8618 Other acute osteomyelitis, other site: Secondary | ICD-10-CM

## 2016-06-29 HISTORY — DX: Extradural and subdural abscess, unspecified: G06.2

## 2016-06-29 HISTORY — PX: LUMBAR WOUND DEBRIDEMENT: SHX1988

## 2016-06-29 LAB — CBC WITH DIFFERENTIAL/PLATELET
Basophils Absolute: 0 10*3/uL (ref 0.0–0.1)
Basophils Relative: 0 %
EOS ABS: 0.2 10*3/uL (ref 0.0–0.7)
Eosinophils Relative: 3 %
HEMATOCRIT: 33.8 % — AB (ref 39.0–52.0)
HEMOGLOBIN: 10.4 g/dL — AB (ref 13.0–17.0)
LYMPHS ABS: 1.1 10*3/uL (ref 0.7–4.0)
LYMPHS PCT: 14 %
MCH: 24 pg — AB (ref 26.0–34.0)
MCHC: 30.8 g/dL (ref 30.0–36.0)
MCV: 77.9 fL — AB (ref 78.0–100.0)
MONOS PCT: 7 %
Monocytes Absolute: 0.6 10*3/uL (ref 0.1–1.0)
NEUTROS ABS: 5.9 10*3/uL (ref 1.7–7.7)
NEUTROS PCT: 76 %
Platelets: 406 10*3/uL — ABNORMAL HIGH (ref 150–400)
RBC: 4.34 MIL/uL (ref 4.22–5.81)
RDW: 15 % (ref 11.5–15.5)
WBC: 7.8 10*3/uL (ref 4.0–10.5)

## 2016-06-29 LAB — SEDIMENTATION RATE: SED RATE: 58 mm/h — AB (ref 0–16)

## 2016-06-29 LAB — URINALYSIS, ROUTINE W REFLEX MICROSCOPIC
BILIRUBIN URINE: NEGATIVE
Glucose, UA: NEGATIVE mg/dL
Hgb urine dipstick: NEGATIVE
Ketones, ur: NEGATIVE mg/dL
LEUKOCYTES UA: NEGATIVE
NITRITE: NEGATIVE
PH: 6 (ref 5.0–8.0)
Protein, ur: NEGATIVE mg/dL
SPECIFIC GRAVITY, URINE: 1.036 — AB (ref 1.005–1.030)

## 2016-06-29 LAB — C-REACTIVE PROTEIN: CRP: 16.9 mg/dL — ABNORMAL HIGH (ref <1.0)

## 2016-06-29 LAB — BASIC METABOLIC PANEL
Anion gap: 10 (ref 5–15)
BUN: 15 mg/dL (ref 6–20)
CHLORIDE: 105 mmol/L (ref 101–111)
CO2: 24 mmol/L (ref 22–32)
CREATININE: 0.83 mg/dL (ref 0.61–1.24)
Calcium: 9.1 mg/dL (ref 8.9–10.3)
GFR calc Af Amer: >60 mL/min (ref >60)
GFR calc non Af Amer: >60 mL/min (ref >60)
Glucose, Bld: 105 mg/dL — ABNORMAL HIGH (ref 65–99)
POTASSIUM: 3.9 mmol/L (ref 3.5–5.1)
SODIUM: 139 mmol/L (ref 135–145)

## 2016-06-29 SURGERY — LUMBAR WOUND DEBRIDEMENT
Anesthesia: General | Site: Back

## 2016-06-29 MED ORDER — 0.9 % SODIUM CHLORIDE (POUR BTL) OPTIME
TOPICAL | Status: DC | PRN
  Administered 2016-06-29: 1000 mL

## 2016-06-29 MED ORDER — OXYCODONE HCL 5 MG/5ML PO SOLN: 5.0000 mg | Freq: Once | ORAL | Status: AC | PRN

## 2016-06-29 MED ORDER — ACETAMINOPHEN 650 MG RE SUPP: 650.0000 mg | Freq: Four times a day (QID) | RECTAL | Status: DC | PRN

## 2016-06-29 MED ORDER — ACETAMINOPHEN 325 MG PO TABS: 650.0000 mg | ORAL_TABLET | Freq: Four times a day (QID) | ORAL | Status: DC | PRN

## 2016-06-29 MED ORDER — ADULT MULTIVITAMIN W/MINERALS CH
1.0000 | ORAL_TABLET | Freq: Every day | ORAL | Status: DC
  Administered 2016-06-30 – 2016-07-03 (×4): 1 via ORAL
  Filled 2016-06-29 (×4): qty 1

## 2016-06-29 MED ORDER — PHENYLEPHRINE HCL 10 MG/ML IJ SOLN
INTRAMUSCULAR | Status: DC | PRN
  Administered 2016-06-29 (×3): 80 ug via INTRAVENOUS

## 2016-06-29 MED ORDER — FENTANYL CITRATE (PF) 100 MCG/2ML IJ SOLN: 25.0000 ug | INTRAMUSCULAR | Status: DC | PRN

## 2016-06-29 MED ORDER — LORAZEPAM 2 MG/ML IJ SOLN
1.0000 mg | Freq: Once | INTRAMUSCULAR | Status: AC | PRN
  Administered 2016-06-29: 1 mg via INTRAVENOUS
  Filled 2016-06-29: qty 1

## 2016-06-29 MED ORDER — HYDROMORPHONE HCL 1 MG/ML IJ SOLN
0.5000 mg | Freq: Once | INTRAMUSCULAR | Status: AC
  Administered 2016-06-29: 0.5 mg via INTRAVENOUS
  Filled 2016-06-29: qty 1

## 2016-06-29 MED ORDER — ZOLPIDEM TARTRATE 5 MG PO TABS: 5.0000 mg | ORAL_TABLET | Freq: Every evening | ORAL | Status: DC | PRN

## 2016-06-29 MED ORDER — HYDROMORPHONE HCL 1 MG/ML IJ SOLN
0.5000 mg | INTRAMUSCULAR | Status: DC | PRN
  Administered 2016-06-29: 0.5 mg via INTRAVENOUS
  Filled 2016-06-29: qty 1

## 2016-06-29 MED ORDER — FENTANYL CITRATE (PF) 100 MCG/2ML IJ SOLN
INTRAMUSCULAR | Status: AC
  Filled 2016-06-29: qty 4

## 2016-06-29 MED ORDER — ONDANSETRON HCL 4 MG/2ML IJ SOLN: 4.0000 mg | INTRAMUSCULAR | Status: DC | PRN

## 2016-06-29 MED ORDER — OXYCODONE HCL 5 MG PO TABS
ORAL_TABLET | ORAL | Status: AC
  Filled 2016-06-29: qty 1

## 2016-06-29 MED ORDER — METHOCARBAMOL 500 MG PO TABS: 500.0000 mg | ORAL_TABLET | Freq: Three times a day (TID) | ORAL | Status: DC

## 2016-06-29 MED ORDER — ALUM & MAG HYDROXIDE-SIMETH 200-200-20 MG/5ML PO SUSP: 30.0000 mL | Freq: Four times a day (QID) | ORAL | Status: DC | PRN

## 2016-06-29 MED ORDER — SODIUM CHLORIDE 0.9 % IV SOLN
INTRAVENOUS | Status: DC
  Administered 2016-06-29 – 2016-06-30 (×2): via INTRAVENOUS
  Administered 2016-07-02: 20 mL via INTRAVENOUS

## 2016-06-29 MED ORDER — KETOROLAC TROMETHAMINE 15 MG/ML IJ SOLN: 15.0000 mg | Freq: Four times a day (QID) | INTRAMUSCULAR | Status: DC | PRN

## 2016-06-29 MED ORDER — FENTANYL CITRATE (PF) 100 MCG/2ML IJ SOLN
50.0000 ug | INTRAMUSCULAR | Status: DC | PRN
  Administered 2016-06-29 (×2): 50 ug via INTRAVENOUS
  Filled 2016-06-29 (×2): qty 2

## 2016-06-29 MED ORDER — PHENYLEPHRINE 40 MCG/ML (10ML) SYRINGE FOR IV PUSH (FOR BLOOD PRESSURE SUPPORT)
PREFILLED_SYRINGE | INTRAVENOUS | Status: AC
  Filled 2016-06-29: qty 10

## 2016-06-29 MED ORDER — PIPERACILLIN-TAZOBACTAM 3.375 G IVPB
3.3750 g | Freq: Three times a day (TID) | INTRAVENOUS | Status: DC
  Filled 2016-06-29 (×2): qty 50

## 2016-06-29 MED ORDER — PROPOFOL 10 MG/ML IV BOLUS
INTRAVENOUS | Status: DC | PRN
  Administered 2016-06-29 (×3): 50 mg via INTRAVENOUS
  Administered 2016-06-29: 150 mg via INTRAVENOUS

## 2016-06-29 MED ORDER — VANCOMYCIN HCL 1000 MG IV SOLR
INTRAVENOUS | Status: DC | PRN
  Administered 2016-06-29: 1000 mg via INTRAVENOUS

## 2016-06-29 MED ORDER — DIAZEPAM 5 MG PO TABS
5.0000 mg | ORAL_TABLET | Freq: Four times a day (QID) | ORAL | Status: DC | PRN
  Administered 2016-06-29 – 2016-07-02 (×6): 5 mg via ORAL
  Filled 2016-06-29 (×5): qty 1

## 2016-06-29 MED ORDER — ROCURONIUM BROMIDE 100 MG/10ML IV SOLN
INTRAVENOUS | Status: DC | PRN
  Administered 2016-06-29 (×2): 10 mg via INTRAVENOUS
  Administered 2016-06-29: 40 mg via INTRAVENOUS
  Administered 2016-06-29: 10 mg via INTRAVENOUS

## 2016-06-29 MED ORDER — SUCCINYLCHOLINE CHLORIDE 200 MG/10ML IV SOSY
PREFILLED_SYRINGE | INTRAVENOUS | Status: AC
  Filled 2016-06-29: qty 10

## 2016-06-29 MED ORDER — PROPOFOL 10 MG/ML IV BOLUS
INTRAVENOUS | Status: AC
  Filled 2016-06-29: qty 20

## 2016-06-29 MED ORDER — MORPHINE SULFATE (PF) 2 MG/ML IV SOLN
1.0000 mg | INTRAVENOUS | Status: DC | PRN
  Administered 2016-06-29 – 2016-06-30 (×2): 2 mg via INTRAVENOUS
  Administered 2016-06-30: 1 mg via INTRAVENOUS
  Filled 2016-06-29 (×3): qty 1

## 2016-06-29 MED ORDER — HYDROCODONE-ACETAMINOPHEN 5-325 MG PO TABS: 2.0000 | ORAL_TABLET | Freq: Four times a day (QID) | ORAL | Status: DC

## 2016-06-29 MED ORDER — PHENYLEPHRINE HCL 10 MG/ML IJ SOLN: INTRAMUSCULAR | Status: DC | PRN

## 2016-06-29 MED ORDER — MORPHINE SULFATE (PF) 4 MG/ML IV SOLN
4.0000 mg | Freq: Once | INTRAVENOUS | Status: AC
  Administered 2016-06-29: 4 mg via INTRAVENOUS
  Filled 2016-06-29: qty 1

## 2016-06-29 MED ORDER — SODIUM CHLORIDE 0.9 % IR SOLN
Status: DC | PRN
  Administered 2016-06-29 (×2): 3000 mL

## 2016-06-29 MED ORDER — LACTATED RINGERS IV SOLN
INTRAVENOUS | Status: DC | PRN
  Administered 2016-06-29 (×3): via INTRAVENOUS

## 2016-06-29 MED ORDER — DIAZEPAM 5 MG PO TABS
ORAL_TABLET | ORAL | Status: AC
  Filled 2016-06-29: qty 1

## 2016-06-29 MED ORDER — SODIUM CHLORIDE 0.9% FLUSH
3.0000 mL | Freq: Two times a day (BID) | INTRAVENOUS | Status: DC
  Administered 2016-07-02: 3 mL via INTRAVENOUS

## 2016-06-29 MED ORDER — ACETAMINOPHEN 325 MG PO TABS: 650.0000 mg | ORAL_TABLET | ORAL | Status: DC | PRN

## 2016-06-29 MED ORDER — FENTANYL CITRATE (PF) 100 MCG/2ML IJ SOLN
INTRAMUSCULAR | Status: AC
  Filled 2016-06-29: qty 2

## 2016-06-29 MED ORDER — ROCURONIUM BROMIDE 10 MG/ML (PF) SYRINGE
PREFILLED_SYRINGE | INTRAVENOUS | Status: AC
  Filled 2016-06-29: qty 10

## 2016-06-29 MED ORDER — ACETAMINOPHEN 650 MG RE SUPP: 650.0000 mg | RECTAL | Status: DC | PRN

## 2016-06-29 MED ORDER — BUPIVACAINE-EPINEPHRINE 0.25% -1:200000 IJ SOLN
INTRAMUSCULAR | Status: AC
  Filled 2016-06-29: qty 1

## 2016-06-29 MED ORDER — MENTHOL 3 MG MT LOZG: 1.0000 | LOZENGE | OROMUCOSAL | Status: DC | PRN

## 2016-06-29 MED ORDER — OXYCODONE HCL 5 MG PO TABS
5.0000 mg | ORAL_TABLET | Freq: Once | ORAL | Status: AC | PRN
  Administered 2016-06-29: 5 mg via ORAL

## 2016-06-29 MED ORDER — ONDANSETRON HCL 4 MG/2ML IJ SOLN: 4.0000 mg | Freq: Once | INTRAMUSCULAR | Status: DC | PRN

## 2016-06-29 MED ORDER — VANCOMYCIN HCL IN DEXTROSE 750-5 MG/150ML-% IV SOLN
750.0000 mg | Freq: Two times a day (BID) | INTRAVENOUS | Status: DC
  Administered 2016-06-30 – 2016-07-02 (×6): 750 mg via INTRAVENOUS
  Filled 2016-06-29 (×9): qty 150

## 2016-06-29 MED ORDER — EPHEDRINE 5 MG/ML INJ
INTRAVENOUS | Status: AC
  Filled 2016-06-29: qty 10

## 2016-06-29 MED ORDER — MIDAZOLAM HCL 2 MG/2ML IJ SOLN
INTRAMUSCULAR | Status: AC
  Filled 2016-06-29: qty 2

## 2016-06-29 MED ORDER — SUGAMMADEX SODIUM 500 MG/5ML IV SOLN
INTRAVENOUS | Status: DC | PRN
  Administered 2016-06-29: 402.8 mg via INTRAVENOUS

## 2016-06-29 MED ORDER — DOCUSATE SODIUM 100 MG PO CAPS
100.0000 mg | ORAL_CAPSULE | Freq: Every day | ORAL | Status: DC
  Administered 2016-06-30 – 2016-07-03 (×4): 100 mg via ORAL
  Filled 2016-06-29 (×4): qty 1

## 2016-06-29 MED ORDER — PIPERACILLIN-TAZOBACTAM 3.375 G IVPB
3.3750 g | Freq: Once | INTRAVENOUS | Status: AC
  Administered 2016-06-29: 3.375 g via INTRAVENOUS
  Filled 2016-06-29: qty 50

## 2016-06-29 MED ORDER — BISACODYL 5 MG PO TBEC
5.0000 mg | DELAYED_RELEASE_TABLET | Freq: Every day | ORAL | Status: DC | PRN
  Administered 2016-07-03: 5 mg via ORAL
  Filled 2016-06-29: qty 1

## 2016-06-29 MED ORDER — MORPHINE SULFATE (PF) 2 MG/ML IV SOLN: 2.0000 mg | INTRAVENOUS | Status: DC | PRN

## 2016-06-29 MED ORDER — VANCOMYCIN HCL IN DEXTROSE 1-5 GM/200ML-% IV SOLN
INTRAVENOUS | Status: AC
  Filled 2016-06-29: qty 200

## 2016-06-29 MED ORDER — FENTANYL CITRATE (PF) 100 MCG/2ML IJ SOLN
INTRAMUSCULAR | Status: DC | PRN
  Administered 2016-06-29: 50 ug via INTRAVENOUS
  Administered 2016-06-29: 100 ug via INTRAVENOUS
  Administered 2016-06-29: 50 ug via INTRAVENOUS

## 2016-06-29 MED ORDER — SENNOSIDES-DOCUSATE SODIUM 8.6-50 MG PO TABS
1.0000 | ORAL_TABLET | Freq: Every evening | ORAL | Status: DC | PRN
  Filled 2016-06-29: qty 1

## 2016-06-29 MED ORDER — SODIUM CHLORIDE 0.9% FLUSH: 3.0000 mL | INTRAVENOUS | Status: DC | PRN

## 2016-06-29 MED ORDER — ONDANSETRON HCL 4 MG/2ML IJ SOLN
INTRAMUSCULAR | Status: DC | PRN
Start: 2016-06-29 — End: 2016-06-29
  Administered 2016-06-29: 4 mg via INTRAVENOUS

## 2016-06-29 MED ORDER — POLYETHYLENE GLYCOL 3350 17 G PO PACK
17.0000 g | PACK | Freq: Every day | ORAL | Status: DC | PRN
  Administered 2016-07-02: 17 g via ORAL
  Filled 2016-06-29: qty 1

## 2016-06-29 MED ORDER — DEXTROSE 5 % IV SOLN
1.0000 g | Freq: Two times a day (BID) | INTRAVENOUS | Status: DC
  Administered 2016-06-30: 1 g via INTRAVENOUS
  Filled 2016-06-29 (×3): qty 10

## 2016-06-29 MED ORDER — FLEET ENEMA 7-19 GM/118ML RE ENEM: 1.0000 | ENEMA | Freq: Once | RECTAL | Status: DC | PRN

## 2016-06-29 MED ORDER — PHENOL 1.4 % MT LIQD: 1.0000 | OROMUCOSAL | Status: DC | PRN

## 2016-06-29 MED ORDER — OXYCODONE-ACETAMINOPHEN 5-325 MG PO TABS
1.0000 | ORAL_TABLET | ORAL | Status: DC | PRN
  Administered 2016-06-30 – 2016-07-03 (×16): 2 via ORAL
  Filled 2016-06-29 (×17): qty 2

## 2016-06-29 MED ORDER — VANCOMYCIN HCL IN DEXTROSE 1-5 GM/200ML-% IV SOLN
1000.0000 mg | Freq: Once | INTRAVENOUS | Status: AC
  Administered 2016-06-29: 1000 mg via INTRAVENOUS
  Filled 2016-06-29: qty 200

## 2016-06-29 MED ORDER — GADOBENATE DIMEGLUMINE 529 MG/ML IV SOLN
20.0000 mL | Freq: Once | INTRAVENOUS | Status: AC | PRN
  Administered 2016-06-29: 20 mL via INTRAVENOUS

## 2016-06-29 MED ORDER — SODIUM CHLORIDE 0.9 % IV SOLN: 250.0000 mL | INTRAVENOUS | Status: DC

## 2016-06-29 MED ORDER — LIDOCAINE 2% (20 MG/ML) 5 ML SYRINGE
INTRAMUSCULAR | Status: AC
  Filled 2016-06-29: qty 5

## 2016-06-29 MED ORDER — SUCCINYLCHOLINE CHLORIDE 20 MG/ML IJ SOLN
INTRAMUSCULAR | Status: DC | PRN
  Administered 2016-06-29: 100 mg via INTRAVENOUS

## 2016-06-29 MED ORDER — ROCURONIUM BROMIDE 100 MG/10ML IV SOLN: INTRAVENOUS | Status: DC | PRN

## 2016-06-29 MED ORDER — MIDAZOLAM HCL 2 MG/2ML IJ SOLN
INTRAMUSCULAR | Status: DC | PRN
  Administered 2016-06-29: 2 mg via INTRAVENOUS

## 2016-06-29 SURGICAL SUPPLY — 72 items
BENZOIN TINCTURE PRP APPL 2/3 (GAUZE/BANDAGES/DRESSINGS) ×2 IMPLANT
CANISTER SUCTION 2500CC (MISCELLANEOUS) IMPLANT
CONT SPEC 4OZ CLIKSEAL STRL BL (MISCELLANEOUS) ×4 IMPLANT
CORDS BIPOLAR (ELECTRODE) ×2 IMPLANT
COVER SURGICAL LIGHT HANDLE (MISCELLANEOUS) ×2 IMPLANT
DRAIN CHANNEL 15F RND FF W/TCR (WOUND CARE) ×2 IMPLANT
DRAIN WOUND SNY 15 RND (WOUND CARE) IMPLANT
DRAPE POUCH INSTRU U-SHP 10X18 (DRAPES) ×2 IMPLANT
DRAPE PROXIMA HALF (DRAPES) ×4 IMPLANT
DRAPE SURG 17X23 STRL (DRAPES) ×8 IMPLANT
DURAPREP 26ML APPLICATOR (WOUND CARE) ×2 IMPLANT
ELECT BLADE 4.0 EZ CLEAN MEGAD (MISCELLANEOUS) ×2
ELECT REM PT RETURN 9FT ADLT (ELECTROSURGICAL) ×2
ELECTRODE BLDE 4.0 EZ CLN MEGD (MISCELLANEOUS) ×1 IMPLANT
ELECTRODE REM PT RTRN 9FT ADLT (ELECTROSURGICAL) ×1 IMPLANT
EVACUATOR SILICONE 100CC (DRAIN) ×2 IMPLANT
GAUZE SPONGE 4X4 12PLY STRL (GAUZE/BANDAGES/DRESSINGS) ×2 IMPLANT
GLOVE BIO SURGEON STRL SZ7 (GLOVE) ×2 IMPLANT
GLOVE BIO SURGEON STRL SZ8 (GLOVE) ×2 IMPLANT
GLOVE BIOGEL PI IND STRL 8 (GLOVE) ×1 IMPLANT
GLOVE BIOGEL PI INDICATOR 8 (GLOVE) ×1
GOWN STRL REUS W/ TWL LRG LVL3 (GOWN DISPOSABLE) ×2 IMPLANT
GOWN STRL REUS W/ TWL XL LVL3 (GOWN DISPOSABLE) ×1 IMPLANT
GOWN STRL REUS W/TWL LRG LVL3 (GOWN DISPOSABLE) ×2
GOWN STRL REUS W/TWL XL LVL3 (GOWN DISPOSABLE) ×1
HANDPIECE INTERPULSE COAX TIP (DISPOSABLE)
IV CATH 14GX2 1/4 (CATHETERS) ×2 IMPLANT
KIT BASIN OR (CUSTOM PROCEDURE TRAY) ×2 IMPLANT
KIT POSITION SURG JACKSON T1 (MISCELLANEOUS) ×2 IMPLANT
KIT ROOM TURNOVER OR (KITS) ×2 IMPLANT
MANIFOLD NEPTUNE WASTE (CANNULA) ×2 IMPLANT
NEEDLE HYPO 25GX1X1/2 BEV (NEEDLE) ×2 IMPLANT
NEEDLE SPNL 18GX3.5 QUINCKE PK (NEEDLE) ×2 IMPLANT
NS IRRIG 1000ML POUR BTL (IV SOLUTION) ×2 IMPLANT
PACK LAMINECTOMY ORTHO (CUSTOM PROCEDURE TRAY) ×2 IMPLANT
PACK UNIVERSAL I (CUSTOM PROCEDURE TRAY) ×2 IMPLANT
PAD ARMBOARD 7.5X6 YLW CONV (MISCELLANEOUS) ×4 IMPLANT
PATTIES SURGICAL .5 X.5 (GAUZE/BANDAGES/DRESSINGS) IMPLANT
PATTIES SURGICAL .5 X3 (DISPOSABLE) ×2 IMPLANT
PATTIES SURGICAL .75X.75 (GAUZE/BANDAGES/DRESSINGS) IMPLANT
PATTIES SURGICAL 1X1 (DISPOSABLE) IMPLANT
SET HNDPC FAN SPRY TIP SCT (DISPOSABLE) IMPLANT
SPONGE SURGIFOAM ABS GEL 100 (HEMOSTASIS) IMPLANT
STAPLER VISISTAT 35W (STAPLE) ×2 IMPLANT
STRIP CLOSURE SKIN 1/2X4 (GAUZE/BANDAGES/DRESSINGS) ×2 IMPLANT
SURGIFLO W/THROMBIN 8M KIT (HEMOSTASIS) ×2 IMPLANT
SUT ETHILON 2 0 FS 18 (SUTURE) IMPLANT
SUT ETHILON 3 0 FSL (SUTURE) IMPLANT
SUT ETHILON 3 0 PS 1 (SUTURE) ×2 IMPLANT
SUT MNCRL AB 4-0 PS2 18 (SUTURE) ×2 IMPLANT
SUT PDS AB 0 CT 36 (SUTURE) ×4 IMPLANT
SUT PDS AB 1 CTX 36 (SUTURE) ×4 IMPLANT
SUT PDS AB 2-0 CT1 27 (SUTURE) ×4 IMPLANT
SUT VIC AB 1 CT1 18XCR BRD 8 (SUTURE) ×1 IMPLANT
SUT VIC AB 1 CT1 8-18 (SUTURE) ×1
SUT VIC AB 1 CTX 36 (SUTURE)
SUT VIC AB 1 CTX36XBRD ANBCTR (SUTURE) IMPLANT
SUT VIC AB 2-0 CT1 27 (SUTURE)
SUT VIC AB 2-0 CT1 TAPERPNT 27 (SUTURE) IMPLANT
SUT VIC AB 2-0 CT2 18 VCP726D (SUTURE) IMPLANT
SUT VIC AB 3-0 X1 27 (SUTURE) IMPLANT
SYR BULB IRRIGATION 50ML (SYRINGE) ×2 IMPLANT
SYR CONTROL 10ML LL (SYRINGE) ×4 IMPLANT
SYRINGE 20CC LL (MISCELLANEOUS) ×2 IMPLANT
TAPE CLOTH SURG 4X10 WHT LF (GAUZE/BANDAGES/DRESSINGS) ×2 IMPLANT
TAPE CLOTH SURG 6X10 WHT LF (GAUZE/BANDAGES/DRESSINGS) ×2 IMPLANT
TIP IRRIGATION SMALL (MISCELLANEOUS) ×2 IMPLANT
TOWEL OR 17X24 6PK STRL BLUE (TOWEL DISPOSABLE) ×2 IMPLANT
TOWEL OR 17X26 10 PK STRL BLUE (TOWEL DISPOSABLE) ×2 IMPLANT
TRAY FOLEY CATH 16FRSI W/METER (SET/KITS/TRAYS/PACK) IMPLANT
WATER STERILE IRR 1000ML POUR (IV SOLUTION) IMPLANT
YANKAUER SUCT BULB TIP NO VENT (SUCTIONS) ×2 IMPLANT

## 2016-06-29 NOTE — Progress Notes (Signed)
I have spoken with Dr. Oleta Mouse regarding Donald [name]. She states that he presented with increasing pain with significant difficulty ambulating. I have then spoken with patient. He states that his pain now involves his right as well as his left leg (was previously in right leg only). 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 There is little doubt that these finding are correlating to the patient's pain. Per Dr. Oleta Mouse, patient has not eaten since last night. I have asked Dr. Oleta Mouse to stop abx so that intraop cultures are as accurate as possible. I have spoken with patient about the indications for surgery to help eradicate his infection, and that a PICC line and abx for at least 6 weeks will be needed depending on recs from ID. Patient is in agreement with the plan. At this point, patient is scheduled for surgery at 5pm at [facility]. I have also paged and spoken with Dr. Carlyle Basques from ID. She will see him in the morning, and she recommended that he empirically be started on Vanc and Ceftriaxone postoperatively.

## 2016-06-29 NOTE — ED Notes (Signed)
Pt. Is unable to urinate at this time. Is aware we need a sample but is requesting pain meds first,

## 2016-06-29 NOTE — Anesthesia Preprocedure Evaluation (Addendum)
Anesthesia Evaluation  Patient identified by MRN, date of birth, ID band Patient awake    Reviewed: Allergy & Precautions, NPO status , Patient's Chart, lab work & pertinent test results  Airway Mallampati: II  TM Distance: >3 FB Neck ROM: Full    Dental  (+) Teeth Intact, Dental Advisory Given   Pulmonary former smoker,    breath sounds clear to auscultation       Cardiovascular  Rhythm:Regular Rate:Normal     Neuro/Psych    GI/Hepatic negative GI ROS, Neg liver ROS,   Endo/Other  negative endocrine ROS  Renal/GU negative Renal ROS     Musculoskeletal  (+) Arthritis , Osteoarthritis,    Abdominal   Peds  Hematology negative hematology ROS (+)   Anesthesia Other Findings   Reproductive/Obstetrics                            Anesthesia Physical Anesthesia Plan  ASA: III and emergent  Anesthesia Plan: General   Post-op Pain Management:    Induction: Intravenous  Airway Management Planned: Oral ETT  Additional Equipment:   Intra-op Plan:   Post-operative Plan: Extubation in OR  Informed Consent: I have reviewed the patients History and Physical, chart, labs and discussed the procedure including the risks, benefits and alternatives for the proposed anesthesia with the patient or authorized representative who has indicated his/her understanding and acceptance.   Dental advisory given  Plan Discussed with: CRNA and Anesthesiologist  Anesthesia Plan Comments:         Anesthesia Quick Evaluation

## 2016-06-29 NOTE — H&P (Signed)
History and Physical    Donald [name] MHD:[id number] DOB: 1941-03-09 DOA: 06/29/2016    PCP: [name], MD  Patient coming from: home  Chief Complaint: b/l  leg pain  HPI: Donald [name] is a 75 y.o. male with medical history significant of prostate CA s/p prostatectomy, spinal stenosis s/p   L3-4 and L4-5 decompression on July 20th by [name]. He has had pain since the procedure but over the past week it has been severe. He describes shooting pains in right leg involving buttock, back of leg, thigh, shin, calm and same areas on right leg except calf. He feels both legs are weak. No numbness or tingling. Maybe has had a mild fever. No chills, sweats. Having increasing difficulty with walking now.   ED Course:  MRI showing discitis, osteomyelitis L4-L5 with epidural abscess causing severe spinal stenosis in L4-L5 Plt 406 ESR 58  Review of Systems:  Weight loss of 10-12 lbs in past 2 months All other systems reviewed and apart from HPI, are negative.  Past Medical History:  Diagnosis Date  . Adenomatous polyps 10/2002, 06/2009  . Arthritis   . BPH (benign prostatic hyperplasia)   . Hyperlipemia   . Prostate cancer ([place])   . Spinal stenosis   . Wears glasses     Past Surgical History:  Procedure Laterality Date  . ANTERIOR CERVICAL DECOMP/DISCECTOMY FUSION N/A 09/18/2013   Procedure: Anterior cervical decompression fusion, cervical 4-5, cervical 5-6, cervical 6-7 with instrumentation and allograft;  Surgeon: Sinclair Ship, MD;  Location: [facility];  Service: Orthopedics;  Laterality: N/A;  Anterior cervical decompression fusion, cervical 4-5, cervical 5-6, cervical 6-7 with instrumentation and allograft  . COLONOSCOPY    . COLONOSCOPY W/ POLYPECTOMY  11/04/2002   Sigmoid adenomas x2, 1 cm maximum  . COLONOSCOPY W/ POLYPECTOMY  06/08/2009   6 polyps, largest 8 mm, at least 3 adenomas  . CYSTOSCOPY  04/2010  . ESOPHAGOGASTRODUODENOSCOPY  11/04/2002   Duodenitis, gastritis without H. pylori   . HIP ARTHROPLASTY  04/2010   Left - Dr. Mayer Camel, right 2012  . JOINT REPLACEMENT    . LUMBAR LAMINECTOMY/DECOMPRESSION MICRODISCECTOMY N/A 03/30/2016   Procedure: LUMBAR 3-4, LUMBAR 4-5 DECOMPRESSION ;  Surgeon: Phylliss Bob, MD;  Location: [facility];  Service: Orthopedics;  Laterality: N/A;  LUMBAR 3-4, LUMBAR 4-5 DECOMPRESSION   . PROSTATE SURGERY    . RETROPUBIC PROSTATECTOMY  09/2003  . TONSILLECTOMY    . UPPER GASTROINTESTINAL ENDOSCOPY      Social History:   reports that he quit smoking about 43 years ago. His smoking use included Cigars. He has never used smokeless tobacco. He reports that he drinks about 1.2 oz of alcohol per week . He reports that he does not use drugs.  Quit drinking 2 beers a week since July 20th.  Lives with wife- has been using crutches and now walker and at times wheelchair   Allergies  Allergen Reactions  . Iodine Anaphylaxis  . Ivp Dye [Iodinated Diagnostic Agents] Anaphylaxis    Family History  Problem Relation Age of Onset  . Diabetes Mother   . Heart disease Mother   . Prostate cancer Father   . Cancer Brother     prostate  . Colon cancer Neg Hx   . Stomach cancer Neg Hx      Prior to Admission medications   Medication Sig Start Date End Date Taking? Authorizing Provider  docusate sodium (COLACE) 100 MG capsule Take 100 mg by mouth daily.  Yes Historical Provider, MD  HYDROcodone-acetaminophen (NORCO/VICODIN) 5-325 MG tablet Take 1 tablet by mouth every 6 (six) hours as needed for moderate pain. Patient taking differently: Take 1-2 tablets by mouth every 6 (six) hours as needed for moderate pain.  06/12/16  Yes [name], MD  methocarbamol (ROBAXIN) 500 MG tablet Take 500 mg by mouth 3 (three) times daily.    Yes Historical Provider, MD  Multiple Vitamin (MULTIVITAMIN) capsule Take 1 capsule by mouth daily.     Yes Historical Provider, MD  zolpidem (AMBIEN) 10 MG tablet TAKE 1/2 TABLET AT BEDTIME  AS NEEDED FOR SLEEP. 05/16/16  Yes [name], MD    Physical Exam: Vitals:   06/29/16 1044 06/29/16 1247 06/29/16 1317 06/29/16 1447  BP: 112/67 130/87 118/74 135/84  Pulse: 60 65 61 66  Resp: 18 17 18 17   Temp:      TempSrc:      SpO2: 97% 94% 94% 94%      Constitutional: NAD, calm, comfortable Eyes: PERTLA, lids and conjunctivae normal ENMT: Mucous membranes are moist. Posterior pharynx clear of any exudate or lesions. Normal dentition.  Neck: normal, supple, no masses, no thyromegaly Respiratory: clear to auscultation bilaterally, no wheezing, no crackles. Normal respiratory effort. No accessory muscle use.  Cardiovascular: S1 & S2 heard, regular rate and rhythm, no murmurs / rubs / gallops. No extremity edema. 2+ pedal pulses. No carotid bruits.  Abdomen: No distension, no tenderness, no masses palpated. No hepatosplenomegaly. Bowel sounds normal.  Musculoskeletal: no clubbing / cyanosis. No joint deformity upper and lower extremities. Good ROM, no contractures. Normal muscle tone.  Skin: no rashes, lesions, ulcers. No induration Neurologic: CN 2-12 grossly intact. Sensation intact, Strength 5/5 in arms, lifting legs due to pain 4/5 strength.  Psychiatric: Normal judgment and insight. Alert and oriented x 3. Normal mood.     Labs on Admission: I have personally reviewed following labs and imaging studies  CBC:  Recent Labs Lab 06/26/16 1333 06/29/16 0945  WBC 9.2 7.8  NEUTROABS  --  5.9  HGB 11.2* 10.4*  HCT 34.6* 33.8*  MCV 75.3* 77.9*  PLT  --  341*   Basic Metabolic Panel:  Recent Labs Lab 06/29/16 0945  NA 139  K 3.9  CL 105  CO2 24  GLUCOSE 105*  BUN 15  CREATININE 0.83  CALCIUM 9.1   GFR: Estimated Creatinine Clearance: 97.3 mL/min (by C-G formula based on SCr of 0.83 mg/dL). Liver Function Tests: No results for input(s): AST, ALT, ALKPHOS, BILITOT, PROT, ALBUMIN in the last 168 hours. No results for input(s): LIPASE, AMYLASE in the last  168 hours. No results for input(s): AMMONIA in the last 168 hours. Coagulation Profile: No results for input(s): INR, PROTIME in the last 168 hours. Cardiac Enzymes: No results for input(s): CKTOTAL, CKMB, CKMBINDEX, TROPONINI in the last 168 hours. BNP (last 3 results) No results for input(s): PROBNP in the last 8760 hours. HbA1C: No results for input(s): HGBA1C in the last 72 hours. CBG: No results for input(s): GLUCAP in the last 168 hours. Lipid Profile: No results for input(s): CHOL, HDL, LDLCALC, TRIG, CHOLHDL, LDLDIRECT in the last 72 hours. Thyroid Function Tests: No results for input(s): TSH, T4TOTAL, FREET4, T3FREE, THYROIDAB in the last 72 hours. Anemia Panel: No results for input(s): VITAMINB12, FOLATE, FERRITIN, TIBC, IRON, RETICCTPCT in the last 72 hours. Urine analysis:    Component Value Date/Time   COLORURINE YELLOW 06/29/2016 0924   APPEARANCEUR CLOUDY (A) 06/29/2016 9379  LABSPEC 1.036 (H) 06/29/2016 0924   PHURINE 6.0 06/29/2016 0924   GLUCOSEU NEGATIVE 06/29/2016 0924   HGBUR NEGATIVE 06/29/2016 0924   BILIRUBINUR NEGATIVE 06/29/2016 0924   BILIRUBINUR small (A) 06/26/2016 1335   BILIRUBINUR neg 12/02/2012 1522   KETONESUR NEGATIVE 06/29/2016 0924   PROTEINUR NEGATIVE 06/29/2016 0924   UROBILINOGEN 0.2 06/26/2016 1335   UROBILINOGEN 0.2 09/12/2013 0916   NITRITE NEGATIVE 06/29/2016 0924   LEUKOCYTESUR NEGATIVE 06/29/2016 0924   Sepsis Labs: '@LABRCNTIP'$ (procalcitonin:4,lacticidven:4) )No results found for this or any previous visit (from the past 240 hour(s)).   Radiological Exams on Admission: Mr Lumbar Spine W Wo Contrast  Result Date: 06/29/2016 CLINICAL DATA:  Low back pain, bilateral leg pain, back surgery 03/30/2016 EXAM: MRI LUMBAR SPINE WITHOUT AND WITH CONTRAST TECHNIQUE: Multiplanar and multiecho pulse sequences of the lumbar spine were obtained without and with intravenous contrast. CONTRAST:  12m MULTIHANCE GADOBENATE DIMEGLUMINE 529 MG/ML  IV SOLN COMPARISON:  05/29/2016 FINDINGS: Segmentation:  Standard. Alignment:  Physiologic. Vertebrae: Fluid signal in the L4-5 disc which was previously demonstrated to be desiccated. Severe marrow edema in the L4 and L5 vertebral bodies with endplate irregularity and avid enhancement on post-contrast imaging which has worsened compared with the prior exam of 05/29/2016. Severe paraspinal soft tissue enhancement and enhancement of the medial aspects of the psoas muscles bilaterally consistent with a phlegmon. No drainable paraspinal fluid collection. Severe epidural enhancement at L4 and L5 anteriorly which has increased compared with the prior examination most consistent with an epidural phlegmon in without a drainable abscess measuring approximately 10 mm in thickness. There is severe posterior paraspinal soft tissue enhancement at L4-5 with an enlarging 13 x 15 mm fluid collection along the right posterolateral aspect of the thecal sac most consistent with an abscess at the site of prior laminectomy. Vertebral body heights are maintained. Conus medullaris: Extends to the L1 level and appears normal. Paraspinal and other soft tissues: No other paraspinal abnormality. Disc levels: Disc spaces: Disc height loss at L4-5. T12-L1: No disc bulge. Mild bilateral facet arthropathy. No evidence of neural foraminal stenosis. No central canal stenosis. L1-L2: Mild broad-based disc bulge. Moderate bilateral facet arthropathy. Mild spinal stenosis. No evidence of neural foraminal stenosis. L2-L3: Mild broad-based disc bulge. Severe bilateral facet arthropathy. Moderate spinal stenosis and lateral recess stenosis. No evidence of neural foraminal stenosis. L3-L4: No significant disc protrusion. Moderate bilateral facet arthropathy. Prior L4 laminectomy defect. No evidence of neural foraminal stenosis. No central canal stenosis. L4-L5: Prior laminectomy with severe abnormal paraspinal soft tissue abnormality as described above.  Broad-based disc bulge and epidural phlegmon in resulting resultant severe spinal stenosis. Bilateral mild foraminal stenosis. L5-S1: Mild broad-based disc bulge. Severe bilateral facet arthropathy. No evidence of neural foraminal stenosis. IMPRESSION: 1. Severe discitis-osteomyelitis at L4-5 with extensive paravertebral phlegmon. Severe posterior paraspinal soft tissue enhancement in the surgical bed of prior L4-5 laminectomy with a enlarging 13 x 15 mm right posterolateral fluid collection consistent with an abscess. Anterior epidural phlegmon along the posterior aspect of the L4-5 vertebral bodies without an epidural abscess resulting in severe spinal stenosis at L4 and L5. Electronically Signed   By: HKathreen Devoid  On: 06/29/2016 12:18    EKG: Independently reviewed. Pending.   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   Epidural abscess with severe spinal stenosis  - ER, Dr LOleta Mousehas spoken with Dr DLynann Bologna- plan to take to [facility]- Pain control -  Vanc/Zosyn- will need PICC and long term antibiotics- f/u blood cultures to ensure negative prior to placing PICC  Anemia - appears chronic- follow   DVT prophylaxis: per Ortho Code Status: Full code Family Communication: wife   Disposition Plan: admit to [facility]  Consults called: Ortho by ER  Admission status: admit    [facility] MD [facility] Pager: [email or web address] Password [email or web address] [phone], please contact night-coverage   06/29/2016, 3:57 PM

## 2016-06-29 NOTE — ED Triage Notes (Signed)
Pt reports back pain radiating down bilateral legs since laminectomy in July. Pt has followed up with surgeon for this with no relief. Has also been to UC and PCP for this as well. Has MRI scheduled for tomorrow, followed by surgeon follow-up. Was told by UC and PCP to follow up for ongoing pain.

## 2016-06-29 NOTE — ED Provider Notes (Signed)
[facility] DEPT Provider Note   CSN: [id number] Arrival date & time: 06/29/16  [id number]     History   Chief Complaint Chief Complaint  Patient presents with  . Back Pain  . Leg Pain    HPI Donald [name] is a 75 y.o. male.  HPI 75 year old male who presents with bilateral leg pain. History of laminectomy of L3-L4/L4-L5 7/18 by [name] for spinal stenosis. States after surgical had resolution of a lot of his pain and numbness in the legs. More recently, he has been having intermittent shooting pain down bilateral legs that randomly occurs with movement and ambulation. Scheduled for outpatient MRI and visit with [name] tomorrow. Pain too severe to wait and having difficulty ambulating and sleeping. Came to ED today. No fever, chills, n/v/, abd pain, urinary complaints. No focal numbness or weakness, loss of control of bowel or bladder.   Past Medical History:  Diagnosis Date  . Adenomatous polyps 10/2002, 06/2009  . Arthritis   . BPH (benign prostatic hyperplasia)   . Hyperlipemia   . Prostate cancer ([place])   . Spinal stenosis   . Wears glasses     Patient Active Problem List   Diagnosis Date Noted  . Radicular pain 09/18/2013  . Hyperlipidemia 10/20/2011  . Insomnia 10/20/2011  . Prostate cancer ([place]) 10/20/2011  . Personal history of colonic polyps 12/16/2010    Past Surgical History:  Procedure Laterality Date  . ANTERIOR CERVICAL DECOMP/DISCECTOMY FUSION N/A 09/18/2013   Procedure: Anterior cervical decompression fusion, cervical 4-5, cervical 5-6, cervical 6-7 with instrumentation and allograft;  Surgeon: Sinclair Ship, MD;  Location: [facility];  Service: Orthopedics;  Laterality: N/A;  Anterior cervical decompression fusion, cervical 4-5, cervical 5-6, cervical 6-7 with instrumentation and allograft  . COLONOSCOPY    . COLONOSCOPY W/ POLYPECTOMY  11/04/2002   Sigmoid adenomas x2, 1 cm maximum  . COLONOSCOPY W/ POLYPECTOMY  06/08/2009   6 polyps,  largest 8 mm, at least 3 adenomas  . CYSTOSCOPY  04/2010  . ESOPHAGOGASTRODUODENOSCOPY  11/04/2002   Duodenitis, gastritis without H. pylori   . HIP ARTHROPLASTY  04/2010   Left - Dr. Mayer Camel, right 2012  . JOINT REPLACEMENT    . LUMBAR LAMINECTOMY/DECOMPRESSION MICRODISCECTOMY N/A 03/30/2016   Procedure: LUMBAR 3-4, LUMBAR 4-5 DECOMPRESSION ;  Surgeon: Phylliss Bob, MD;  Location: [facility];  Service: Orthopedics;  Laterality: N/A;  LUMBAR 3-4, LUMBAR 4-5 DECOMPRESSION   . PROSTATE SURGERY    . RETROPUBIC PROSTATECTOMY  09/2003  . TONSILLECTOMY    . UPPER GASTROINTESTINAL ENDOSCOPY         Home Medications    Prior to Admission medications   Medication Sig Start Date End Date Taking? Authorizing Provider  docusate sodium (COLACE) 100 MG capsule Take 100 mg by mouth daily.   Yes Historical Provider, MD  HYDROcodone-acetaminophen (NORCO/VICODIN) 5-325 MG tablet Take 1 tablet by mouth every 6 (six) hours as needed for moderate pain. Patient taking differently: Take 1-2 tablets by mouth every 6 (six) hours as needed for moderate pain.  06/12/16  Yes [name], MD  methocarbamol (ROBAXIN) 500 MG tablet Take 500 mg by mouth 3 (three) times daily.    Yes Historical Provider, MD  Multiple Vitamin (MULTIVITAMIN) capsule Take 1 capsule by mouth daily.     Yes Historical Provider, MD  zolpidem (AMBIEN) 10 MG tablet TAKE 1/2 TABLET AT BEDTIME AS NEEDED FOR SLEEP. 05/16/16  Yes [name], MD    Family  History Family History  Problem Relation Age of Onset  . Diabetes Mother   . Heart disease Mother   . Prostate cancer Father   . Cancer Brother     prostate  . Colon cancer Neg Hx   . Stomach cancer Neg Hx     Social History Social History  Substance Use Topics  . Smoking status: Former Smoker    Types: Cigars    Quit date: 09/12/1972  . Smokeless tobacco: Never Used  . Alcohol use 1.2 oz/week    2 Cans of beer per week     Comment: 1-2 beers a week     Allergies   Iodine and  Ivp dye [iodinated diagnostic agents]   Review of Systems Review of Systems 10/14 systems reviewed and are negative other than those stated in the HPI   Physical Exam Updated Vital Signs BP 117/70 (BP Location: Left Arm)   Pulse 72   Temp 98.4 F (36.9 C) (Oral)   Resp 16   SpO2 93%   Physical Exam Physical Exam  Nursing note and vitals reviewed. Constitutional: Well developed, well nourished, non-toxic, and in no acute distress but appears to be in pain  Head: Normocephalic and atraumatic.  Mouth/Throat: Oropharynx is clear and moist.  Neck: Normal range of motion. Neck supple.  Cardiovascular: Normal rate and regular rhythm.   Pulmonary/Chest: Effort normal and breath sounds normal.  Abdominal: Soft. There is no tenderness. There is no rebound and no guarding.  Musculoskeletal: Normal range of motion. No significant tenderness along lumbar spine. No overlying skin changes. Neurological: Alert, no facial droop, fluent speech, moves all extremities symmetrically, full strength ankle dorsi/plantarflexion, hip flexion/extension, knee flexion/extension, +2 patellar and achilles reflexes, sensation to light touch in tact in bilateral lower extremities Skin: Skin is warm and dry.  Psychiatric: Cooperative   ED Treatments / Results  Labs (all labs ordered are listed, but only abnormal results are displayed) Labs Reviewed  URINALYSIS, ROUTINE W REFLEX MICROSCOPIC (NOT AT [facility])  CBC WITH DIFFERENTIAL/PLATELET  BASIC METABOLIC PANEL    EKG  EKG Interpretation None       Radiology No results found.  Procedures Procedures (including critical care time) CRITICAL CARE Performed by: Forde Dandy   Total critical care time: 45 minutes  Critical care time was exclusive of separately billable procedures and treating other patients.  Critical care was necessary to treat or prevent imminent or life-threatening deterioration.  Critical care was time spent personally by me  on the following activities: development of treatment plan with patient and/or surrogate as well as nursing, discussions with consultants, evaluation of patient's response to treatment, examination of patient, obtaining history from patient or surrogate, ordering and performing treatments and interventions, ordering and review of laboratory studies, ordering and review of radiographic studies, pulse oximetry and re-evaluation of patient's condition.  Medications Ordered in ED Medications  morphine 4 MG/ML injection 4 mg (not administered)  LORazepam (ATIVAN) injection 1 mg (not administered)  fentaNYL (SUBLIMAZE) injection 50 mcg (not administered)     Initial Impression / Assessment and Plan / ED Course  I have reviewed the triage vital signs and the nursing notes.  Pertinent labs & imaging results that were available during my care of the patient were reviewed by me and considered in my medical decision making (see chart for details).  Clinical Course    Presenting with shooting pain down bilateral LE after laminectomy in 7/18. He is afebrile, hemodynamically stable. With no focal neurological  deficits. He is however unable to ambulate. Given his recent surgery and new complaints, MRI of the lumbar spine was performed with and without contrast. This visualized, reviewed with radiology. He has evidence of osteomyelitis and discitis of L4 and L5. There is paravertebral phlegmon formation and a small fluid collection concerning for abscess. I spoke with Dr. Bonita Quin ski from orthopedic surgery. He is recommending transfer to [facility] for operative management. I discussed with Dr. Wynelle Cleveland, who will arrange transfer to [facility] so that patient can be admitted to hospitalist service there post-procedure. Patient to be transferred to pre-op area at [facility].  Final Clinical Impressions(s) / ED Diagnoses   Final diagnoses:  Bilateral lower extremity pain    New Prescriptions New Prescriptions    No medications on file     Forde Dandy, MD 06/29/16 1550

## 2016-06-29 NOTE — H&P (Signed)
PREOPERATIVE H&P  Chief Complaint: Low back pain, bilateral leg pain  HPI: Donald [name] is a 75 y.o. male who presented to [facility] ED. Please see my previous note from earlier today regarding history and MRI findings   Patient has failed multiple forms of conservative care and continues to have pain (see office notes for additional details regarding the patient's full course of treatment)  Past Medical History:  Diagnosis Date  . Adenomatous polyps 10/2002, 06/2009  . Arthritis   . BPH (benign prostatic hyperplasia)   . Hyperlipemia   . Prostate cancer ([place])   . Spinal stenosis   . Wears glasses    Past Surgical History:  Procedure Laterality Date  . ANTERIOR CERVICAL DECOMP/DISCECTOMY FUSION N/A 09/18/2013   Procedure: Anterior cervical decompression fusion, cervical 4-5, cervical 5-6, cervical 6-7 with instrumentation and allograft;  Surgeon: Sinclair Ship, MD;  Location: [facility];  Service: Orthopedics;  Laterality: N/A;  Anterior cervical decompression fusion, cervical 4-5, cervical 5-6, cervical 6-7 with instrumentation and allograft  . COLONOSCOPY    . COLONOSCOPY W/ POLYPECTOMY  11/04/2002   Sigmoid adenomas x2, 1 cm maximum  . COLONOSCOPY W/ POLYPECTOMY  06/08/2009   6 polyps, largest 8 mm, at least 3 adenomas  . CYSTOSCOPY  04/2010  . ESOPHAGOGASTRODUODENOSCOPY  11/04/2002   Duodenitis, gastritis without H. pylori   . HIP ARTHROPLASTY  04/2010   Left - Dr. Mayer Camel, right 2012  . JOINT REPLACEMENT    . LUMBAR LAMINECTOMY/DECOMPRESSION MICRODISCECTOMY N/A 03/30/2016   Procedure: LUMBAR 3-4, LUMBAR 4-5 DECOMPRESSION ;  Surgeon: Phylliss Bob, MD;  Location: [facility];  Service: Orthopedics;  Laterality: N/A;  LUMBAR 3-4, LUMBAR 4-5 DECOMPRESSION   . PROSTATE SURGERY    . RETROPUBIC PROSTATECTOMY  09/2003  . TONSILLECTOMY    . UPPER GASTROINTESTINAL ENDOSCOPY     Social History   Social History  . Marital status: Married    Spouse name: N/A  . Number of children:  N/A  . Years of education: N/A   Occupational History  . Retired     [name]   Social History Main Topics  . Smoking status: Former Smoker    Types: Cigars    Quit date: 09/12/1972  . Smokeless tobacco: Never Used  . Alcohol use 1.2 oz/week    2 Cans of beer per week     Comment: 1-2 beers a week  . Drug use: No  . Sexual activity: Yes     Comment: number of sex partners in the last months 1, current birth control method - no prostate   Other Topics Concern  . None   Social History Narrative   Exercise do Tai Chi daily and walking   Married   Education: Theme park manager; Yes   Family History  Problem Relation Age of Onset  . Diabetes Mother   . Heart disease Mother   . Prostate cancer Father   . Cancer Brother     prostate  . Colon cancer Neg Hx   . Stomach cancer Neg Hx    Allergies  Allergen Reactions  . Iodine Anaphylaxis  . Ivp Dye [Iodinated Diagnostic Agents] Anaphylaxis   Prior to Admission medications   Medication Sig Start Date End Date Taking? Authorizing Provider  docusate sodium (COLACE) 100 MG capsule Take 100 mg by mouth daily.   Yes Historical Provider, MD  HYDROcodone-acetaminophen (NORCO/VICODIN) 5-325 MG tablet Take 1 tablet by mouth every 6 (six) hours as needed for  moderate pain. Patient taking differently: Take 1-2 tablets by mouth every 6 (six) hours as needed for moderate pain.  06/12/16  Yes [name], MD  methocarbamol (ROBAXIN) 500 MG tablet Take 500 mg by mouth 3 (three) times daily.    Yes Historical Provider, MD  Multiple Vitamin (MULTIVITAMIN) capsule Take 1 capsule by mouth daily.     Yes Historical Provider, MD  zolpidem (AMBIEN) 10 MG tablet TAKE 1/2 TABLET AT BEDTIME AS NEEDED FOR SLEEP. 05/16/16  Yes [name], MD     All other systems have been reviewed and were otherwise negative with the exception of those mentioned in the HPI and as above.  Physical Exam: Vitals:   06/29/16 1317 06/29/16 1447  BP:  118/74 135/84  Pulse: 61 66  Resp: 18 17  Temp:      General: Patient uncomfortable Cardiovascular: No pedal edema Respiratory: No cyanosis, no use of accessory musculature Skin: No lesions in the area of chief complaint Neurologic: Sensation intact distally Psychiatric: Patient is competent for consent with normal mood and affect Lymphatic: No axillary or cervical lymphadenopathy  MUSCULOSKELETAL: + SLR bilterally  Assessment/Plan: Irrigation and debridement of lumbar wound  Plan for Procedure(s): [place]   Sinclair Ship, MD 06/29/2016 5:07 PM

## 2016-06-29 NOTE — ED Notes (Signed)
Patient transported to MRI 

## 2016-06-29 NOTE — Anesthesia Procedure Notes (Signed)
Procedure Name: Intubation Date/Time: 06/29/2016 5:33 PM Performed by: Eligha Bridegroom Pre-anesthesia Checklist: Patient identified, Emergency Drugs available, Suction available, Patient being monitored and Timeout performed Patient Re-evaluated:Patient Re-evaluated prior to inductionOxygen Delivery Method: Circle system utilized Preoxygenation: Pre-oxygenation with 100% oxygen Intubation Type: IV induction Ventilation: Mask ventilation without difficulty Laryngoscope Size: Glidescope and 4 Grade View: Grade I Number of attempts: 1 Airway Equipment and Method: Stylet and Video-laryngoscopy Placement Confirmation: ETT inserted through vocal cords under direct vision,  positive ETCO2 and breath sounds checked- equal and bilateral Secured at: 21 cm Tube secured with: Tape Dental Injury: Teeth and Oropharynx as per pre-operative assessment

## 2016-06-29 NOTE — Progress Notes (Addendum)
Pharmacy Antibiotic Follow-up Note  Donald [name] is a 75 y.o. year-old male admitted on 06/29/2016.  The patient is currently on day 1 of Vancomycin & Zosyn for osteomyelitis  Patient has received Vanc 1gm and Zosyn 3.375gm in [facility] ED ~ 2-3pm.  Assessment/Plan: Plan I&D at [facility] with [facility], further doses per [facility], thank you  Temp (24hrs), Avg:98.4 F (36.9 C), Min:98.4 F (36.9 C), Max:98.4 F (36.9 C)   Recent Labs Lab 06/26/16 1333 06/29/16 0945  WBC 9.2 7.8    Recent Labs Lab 06/29/16 0945  CREATININE 0.83   Estimated Creatinine Clearance: 97.3 mL/min (by C-G formula based on SCr of 0.83 mg/dL).    Allergies  Allergen Reactions  . Iodine Anaphylaxis  . Ivp Dye [Iodinated Diagnostic Agents] Anaphylaxis   Antimicrobials this admission: 10/19 Vancomcyin >>  10/19 Zosyn >>   Levels/dose changes this admission:  Microbiology results: 10/19 BCx: sent  Thank you for allowing pharmacy to be a part of this patient's care.  Minda Ditto PharmD 06/29/2016 5:15 PM     Addendum -Vancomycin 750 mg IV q12h -Zosyn 3.375 g IV q8h -Monitor renal fx, cultures, VT as indicated -Pt received suboptimal vancomycin dose earlier in the ED, will schedule next dose sooner than originally planned.   Hughes Better, PharmD, BCPS Clinical Pharmacist 06/29/2016 5:40 PM

## 2016-06-29 NOTE — Transfer of Care (Signed)
Immediate Anesthesia Transfer of Care Note  Patient: Donald [name]  Procedure(s) Performed: Procedure(s): LUMBAR WOUND EXPLORATION AND IRRIGATION AND  DEBRIDEMENT (N/A)  Patient Location: PACU  Anesthesia Type:General  Level of Consciousness: awake, alert  and oriented  Airway & Oxygen Therapy: Patient Spontanous Breathing and Patient connected to nasal cannula oxygen  Post-op Assessment: Report given to RN and Post -op Vital signs reviewed and stable  Post vital signs: Reviewed and stable  Last Vitals:  Vitals:   06/29/16 1317 06/29/16 1447  BP: 118/74 135/84  Pulse: 61 66  Resp: 18 17  Temp:      Last Pain:  Vitals:   06/29/16 1604  TempSrc:   PainSc: 7          Complications: No apparent anesthesia complications

## 2016-06-29 NOTE — ED Notes (Signed)
Pt. Was unable to urinate

## 2016-06-29 NOTE — ED Notes (Signed)
[facility] here to get patient  

## 2016-06-29 NOTE — Anesthesia Postprocedure Evaluation (Signed)
Anesthesia Post Note  Patient: Donald [name]  Procedure(s) Performed: Procedure(s) (LRB): LUMBAR WOUND EXPLORATION AND IRRIGATION AND  DEBRIDEMENT (N/A)  Patient location during evaluation: PACU Anesthesia Type: General Level of consciousness: awake and alert Pain management: pain level controlled Vital Signs Assessment: post-procedure vital signs reviewed and stable Respiratory status: spontaneous breathing, nonlabored ventilation, respiratory function stable and patient connected to nasal cannula oxygen Cardiovascular status: blood pressure returned to baseline and stable Postop Assessment: no signs of nausea or vomiting Anesthetic complications: no    Last Vitals:  Vitals:   06/29/16 2128 06/29/16 2142  BP: 122/76 116/69  Pulse: 65 70  Resp: 13 15  Temp:  36.3 C    Last Pain:  Vitals:   06/29/16 2142  TempSrc:   PainSc: 0-No pain    LLE Motor Response: Purposeful movement;Responds to commands (06/29/16 2142) LLE Sensation: Full sensation (06/29/16 2142) RLE Motor Response: Purposeful movement;Responds to commands (06/29/16 2142) RLE Sensation: Full sensation (06/29/16 2142)      Thelia Tanksley,W. EDMOND

## 2016-06-30 ENCOUNTER — Inpatient Hospital Stay: Admission: RE | Admit: 2016-06-30 | Payer: Medicare Other | Source: Ambulatory Visit

## 2016-06-30 ENCOUNTER — Encounter (HOSPITAL_COMMUNITY): Payer: Self-pay | Admitting: Orthopedic Surgery

## 2016-06-30 DIAGNOSIS — M792 Neuralgia and neuritis, unspecified: Secondary | ICD-10-CM

## 2016-06-30 DIAGNOSIS — Z8042 Family history of malignant neoplasm of prostate: Secondary | ICD-10-CM

## 2016-06-30 DIAGNOSIS — Z8249 Family history of ischemic heart disease and other diseases of the circulatory system: Secondary | ICD-10-CM

## 2016-06-30 DIAGNOSIS — M4626 Osteomyelitis of vertebra, lumbar region: Principal | ICD-10-CM

## 2016-06-30 DIAGNOSIS — G061 Intraspinal abscess and granuloma: Secondary | ICD-10-CM

## 2016-06-30 DIAGNOSIS — Z833 Family history of diabetes mellitus: Secondary | ICD-10-CM

## 2016-06-30 DIAGNOSIS — Z87891 Personal history of nicotine dependence: Secondary | ICD-10-CM

## 2016-06-30 DIAGNOSIS — Z800 Family history of malignant neoplasm of digestive organs: Secondary | ICD-10-CM

## 2016-06-30 DIAGNOSIS — Z91041 Radiographic dye allergy status: Secondary | ICD-10-CM

## 2016-06-30 LAB — BASIC METABOLIC PANEL
ANION GAP: 8 (ref 5–15)
BUN: 7 mg/dL (ref 6–20)
CHLORIDE: 106 mmol/L (ref 101–111)
CO2: 24 mmol/L (ref 22–32)
Calcium: 8.2 mg/dL — ABNORMAL LOW (ref 8.9–10.3)
Creatinine, Ser: 0.82 mg/dL (ref 0.61–1.24)
GFR calc Af Amer: >60 mL/min (ref >60)
GFR calc non Af Amer: >60 mL/min (ref >60)
GLUCOSE: 139 mg/dL — AB (ref 65–99)
POTASSIUM: 3.7 mmol/L (ref 3.5–5.1)
Sodium: 138 mmol/L (ref 135–145)

## 2016-06-30 LAB — CBC
HEMATOCRIT: 28.6 % — AB (ref 39.0–52.0)
HEMOGLOBIN: 8.9 g/dL — AB (ref 13.0–17.0)
MCH: 23.9 pg — AB (ref 26.0–34.0)
MCHC: 31.1 g/dL (ref 30.0–36.0)
MCV: 76.9 fL — AB (ref 78.0–100.0)
Platelets: 364 10*3/uL (ref 150–400)
RBC: 3.72 MIL/uL — ABNORMAL LOW (ref 4.22–5.81)
RDW: 14.9 % (ref 11.5–15.5)
WBC: 8 10*3/uL (ref 4.0–10.5)

## 2016-06-30 LAB — MRSA PCR SCREENING: MRSA by PCR: NEGATIVE

## 2016-06-30 MED ORDER — DEXTROSE 5 % IV SOLN
2.0000 g | Freq: Two times a day (BID) | INTRAVENOUS | Status: DC
  Administered 2016-06-30 – 2016-07-01 (×3): 2 g via INTRAVENOUS
  Filled 2016-06-30 (×4): qty 2

## 2016-06-30 NOTE — Op Note (Signed)
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  SURGERY:  Lumbar wound irrigation and debridement with additional laminectomy for decompression at the L4-5 level with irrigation and debridement of the L4-5 intervertebral disk.  SURGEON:  Phylliss Bob, MD  ASSISTANT:  Pricilla Holm, PA-C.  ANESTHESIA:  General endotracheal anesthesia.  COMPLICATIONS:  None.  DISPOSITION:  Stable.  ESTIMATED BLOOD LOSS:  Minimal.  INDICATIONS FOR SURGERY:  Briefly, Donald [name] is a pleasant 76 year old male, who is approximately 3 months status post an L3-4 and L4-5 decompression by me.  The patient initially did very well after his surgery, but did begin to complain of right buttock pain.  An MRI was ordered by me and was notable for an ill-defined fluid collection.  It was not clear whether this was normal postoperative fluid versus infection.  The patient had no signs or symptoms of infection at the time of the initial MRI.  The initial MRI was not conclusively consistent with diskitis.  I did follow the patient clinically. Unfortunately, over time, his pain did progress.  His right leg pain increased, and he did also begin to have left leg pain as well.  He did present to the emergency department on June 29, 2016, at which time, an additional MRI was obtained.  The MRI at this point was notable for an increase in the size of the fluid collection in the epidural space as well as significantly increased signal intensity involving the L4 and L5 vertebral bodies as well as the L4-5 intervertebral space.   Of note, at the patient's most recent visit in the office, I did recommend an additional MRI to evaluate the previously noted fluid collection, however, the patient was not able to get his MRI in a timely manner, and did  present to the ER with the symptoms noted above.  Upon my review of the MRI, I did feel that it was very likely that the patient did have diskitis, osteomyelitis as well as an epidural abscess.  I did, therefore, recommended the patient to be brought to the [facility] operating room for debridement with intravenous antibiotics right away  (today - the same day the MRI was obtained).  I did evaluate the patient and did have this discussion with him.  He was fully aware of the risks of surgery, in particular, the risk of ongoing infection and the potential need for additional surgical debridement and irrigation.  I did also contact the Infectious Disease specialist on- call, Dr. Carlyle Basques.  She did recommend empirically placing the patient on vancomycin and ceftriaxone postoperatively.   OPERATIVE DETAILS:  On June 29, 2016, the patient was brought to surgery and general endotracheal anesthesia was administered.  The patient was placed prone on a well-padded flat Jackson bed with a spinal  frame.  The patient's back was prepped and draped and a time-out procedure was performed.  The patient's previous incision was reopened with a knife.  Electrocautery was used and the midline was incised.  The spinous processes of L2 and L5 were noted.  I then identified the lamina of L5.  I did use a series of curettes to remove abundant scar tissue just dorsal to the epidural space.  Immediately, superior to the L5 lamina as where the abscess was noted on the MRI.  Upon my evaluation of this particular space, there was noted to be significant a firm and hard soft tissue very much adherent to the dura.  This was debulked significantly.  Parts of this rubbery tissue were entirely  and completely adherent to the dura, and therefore were not removed, as I did feel that attempts at removing it would lead to a very high risk of a durotomy.  I then continued the decompression by performing an additional partial facetectomy on the right and left sides in order to additionally decompress the lateral recess on the left and on the right. On the right side, the right L5 nerve was identified and retracted medially.  I did use a pituitary rongeur to enter into the intervertebral space.  There was essentially no resistance upon entering the intervertebral space, consistent with diskitis.  I did debulk significant granulation tissue in the right lateral recess in the region of the right L5 nerve.  Free disc fragments were identified in the intervertebral space, which were also removed.  I then used pulsatile irrigation very liberally throughout the lumbar wound and pulsatile irrigation was also introduced into the L4-5 intervertebral space.  I was pleased with the decompression and the wound irrigation and debridement.  I then placed a #15 deep Blake drain.  The wound was then closed in layers using #1 PDS followed by 2-0 PDS, followed by 4-0 Monocryl.  Benzoin and Steri-Strips were applied followed by sterile dressing.  All instrument counts were correct at the termination of the procedure.  Of note, Pricilla Holm was my assistant throughout surgery and did aid in retraction, suctioning, and closure from start to finish.     Phylliss Bob, MD     MD/MEDQ  D:  06/29/2016  T:  06/30/2016  Job:  [id number]

## 2016-06-30 NOTE — Progress Notes (Signed)
    Patient s/p lumbar wound I and D States that L leg pain is resolved He continues to have R leg pain, although he does state that it is slightly improved and does not extend past knee.   Physical Exam: Vitals:   06/30/16 0156 06/30/16 0549  BP: 110/75 131/77  Pulse: 70 71  Resp: 17 16  Temp: 98.1 F (36.7 C) 98.4 F (36.9 C)    Dressing in place NVI  Drain output:70cc  POD #1 s/p I and D of lumbar wound, with improved but persistent R leg pain, likely from ongoing aggressive infection  - up OOB to chair with RN and with PT/OT, encourage ambulation - Percocet for pain, Valium for muscle spasms - [name] to see patient this AM for abx recs - Discharge patient per ID recs, cont Vanc and Ceftriaxone for now

## 2016-06-30 NOTE — Evaluation (Signed)
Physical Therapy Evaluation Patient Details Name: Donald [name] MRN: [id number] DOB: 1941/03/27 Today's Date: 06/30/2016   History of Present Illness  Pt is a 75 y.o. 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  Clinical Impression  Pt moving slowly during initial PT session but able to ambulate 25 ft with rw. Noted bilateral LE instability but no gross LE buckling. Anticipate that the pt will continue to make progress with PT services and D/C to home with family assistance. PT to continue to follow and progress as tolerated.     Follow Up Recommendations Home health PT;Supervision for mobility/OOB    Equipment Recommendations  None recommended by PT    Recommendations for Other Services       Precautions / Restrictions Precautions Precautions: Back Precaution Booklet Issued: No Precaution Comments: reviewed back precautions Restrictions Weight Bearing Restrictions: No      Mobility  Bed Mobility Overal bed mobility: Needs Assistance Bed Mobility: Rolling;Sidelying to Sit Rolling: Min assist Sidelying to sit: Mod assist       General bed mobility comments: Cues for logroll sequence, pt using rail to assist.   Transfers Overall transfer level: Needs assistance Equipment used: Rolling walker (2 wheeled) Transfers: Sit to/from Stand Sit to Stand: Min assist         General transfer comment: cues for hand placement and assist to control descent when sitting.   Ambulation/Gait Ambulation/Gait assistance: Min assist Ambulation Distance (Feet): 25 Feet Assistive device: Rolling walker (2 wheeled) Gait Pattern/deviations: Step-through pattern;Decreased step length - right;Decreased step length - left Gait velocity: slow pattern   General Gait Details: Pt having difficulty keeping knees  extended with standing and noted instability through bilateral LEs during ambulation but no buckling.   Stairs            Wheelchair Mobility    Modified Rankin (Stroke Patients Only)       Balance Overall balance assessment: Needs assistance Sitting-balance support: Single extremity supported Sitting balance-Leahy Scale: Fair     Standing balance support: Single extremity supported Standing balance-Leahy Scale: Fair                               Pertinent Vitals/Pain Pain Assessment: Faces Faces Pain Scale: Hurts even more Pain Location: back Pain Descriptors / Indicators: Aching;Spasm Pain Intervention(s): Limited activity within patient's tolerance;Monitored during session    [place] expects to be discharged to:: Private residence Living Arrangements: Spouse/significant other Available Help at Discharge: Family;Available 24 hours/day Type of Home: House Home Access: Stairs to enter Entrance Stairs-Rails: Left Entrance Stairs-Number of Steps: 2 Home Layout: One level Home Equipment: Walker - 2 wheels;Cane - single point;Bedside commode;Crutches      Prior Function Level of Independence: Independent with assistive device(s)         Comments: using rw PTA     Hand Dominance        Extremity/Trunk Assessment   Upper Extremity Assessment: Overall WFL for tasks assessed           Lower Extremity Assessment: Generalized weakness         Communication   Communication: No difficulties  Cognition Arousal/Alertness: Awake/alert Behavior During Therapy: WFL for tasks assessed/performed Overall Cognitive Status: Within Functional Limits for tasks assessed  General Comments      Exercises     Assessment/Plan    PT Assessment Patient needs continued PT services  PT Problem List Decreased strength;Decreased range of motion;Decreased activity tolerance;Decreased balance;Decreased  mobility          PT Treatment Interventions DME instruction;Gait training;Stair training;Functional mobility training;Therapeutic activities;Therapeutic exercise;Patient/family education    PT Goals (Current goals can be found in the Care Plan section)  Acute Rehab PT Goals Patient Stated Goal: be able to walk and move without pain PT Goal Formulation: With patient Time For Goal Achievement: 07/14/16 Potential to Achieve Goals: Good    Frequency Min 5X/week   Barriers to discharge        Co-evaluation               End of Session Equipment Utilized During Treatment: Gait belt Activity Tolerance: Patient tolerated treatment well;Patient limited by fatigue Patient left: in chair;with call bell/phone within reach;with family/visitor present Nurse Communication: Mobility status         Time: [id number] PT Time Calculation (min) (ACUTE ONLY): 39 min   Charges:   PT Evaluation $PT Eval Moderate Complexity: 1 Procedure PT Treatments $Gait Training: 8-22 mins $Therapeutic Activity: 8-22 mins   PT G Codes:        Cassell Clement, PT, CSCS Pager [phone] Office [phone]  06/30/2016, 11:33 AM

## 2016-06-30 NOTE — Progress Notes (Signed)
I have spoken with patient's PT, Ben, to check up on Mr. Lorenz's progress. He was very helpful. He stated that patient was slow to mobilize and generally weak, but that patient did not complain of any substantial leg pain that would limit his ability to ambulate. This was very encouraging to hear. Also tried getting RNs input on patient's progress, but her report was less clear, as she's been tied up with other patients. Will check in with her and the patient later today.

## 2016-06-30 NOTE — Consult Note (Signed)
[place] [facility]  Total days of antibiotics 2        Day 2 vanco        Day 2 ceftriaxone        Reason for Consult: diskitis, osteo, epidural abscess   Referring Physician: dumonski  Principal Problem:   Osteomyelitis of lumbar vertebra (HCC) Active Problems:   Epidural abscess   Discitis    HPI: Donald [name] is a 75 y.o. male who had L4-L5 lumbar decompression roughly 3 months ago by Dr Lynann Bologna. He started to have increasing muscle spasm to quads, radiated to his legs that started one month ago, MRI at that time showed possible 1 cm post op fluid collection/seroma vs. Abscess at the site of surgery at L4-L5. At that time patient inflammatory markers were reported to be normal and decision was to follow since no systemic symptoms, and repeat imaging over the next few weeks.   his symptoms continued to worsen at times 10/10 pain to legs, but not low back pain. No fever, chills, nightsweats. His recent MRI on 10/19 shows worsening phlegmon concerning for epidural abscess. Dr Lynann Bologna took him for I x D on 10/19 evening that showed L4-L5 diskitis and wash out of epidural abscess. He was started on ceftriaxone and vancomycin   Past Medical History:  Diagnosis Date  . Adenomatous polyps 10/2002, 06/2009  . Arthritis   . BPH (benign prostatic hyperplasia)   . Hyperlipemia   . Prostate cancer ([place])   . Spinal stenosis   . Wears glasses     Allergies:  Allergies  Allergen Reactions  . Iodine Anaphylaxis  . Ivp Dye [Iodinated Diagnostic Agents] Anaphylaxis    MEDICATIONS: . cefTRIAXone (ROCEPHIN)  IV  1 g Intravenous Q12H  . diazepam      . docusate sodium  100 mg Oral Daily  . multivitamin with minerals  1 tablet Oral Daily  . oxyCODONE      . sodium chloride flush  3 mL Intravenous Q12H  . vancomycin  750 mg Intravenous Q12H    Social History  Substance Use Topics  . Smoking status: Former Smoker    Types: Cigars    Quit date: 09/12/1972  .  Smokeless tobacco: Never Used  . Alcohol use 1.2 oz/week    2 Cans of beer per week     Comment: 1-2 beers a week    Family History  Problem Relation Age of Onset  . Diabetes Mother   . Heart disease Mother   . Prostate cancer Father   . Cancer Brother     prostate  . Colon cancer Neg Hx   . Stomach cancer Neg Hx    Review of Systems  Constitutional: Negative for fever, chills, diaphoresis, activity change, appetite change, fatigue and unexpected weight change.  HENT: Negative for congestion, sore throat, rhinorrhea, sneezing, trouble swallowing and sinus pressure.  Eyes: Negative for photophobia and visual disturbance.  Respiratory: Negative for cough, chest tightness, shortness of breath, wheezing and stridor.  Cardiovascular: Negative for chest pain, palpitations and leg swelling.  Gastrointestinal: Negative for nausea, vomiting, abdominal pain, diarrhea, constipation, blood in stool, abdominal distention and anal bleeding.  Genitourinary: Negative for dysuria, hematuria, flank pain and difficulty urinating.  Musculoskeletal: + bilateral neuropathic leg pain Negative for myalgias, back pain, joint swelling, arthralgias and gait problem.  Skin: Negative for color change, pallor, rash and wound.  Neurological: Negative for dizziness, tremors, weakness and light-headedness.  Hematological: Negative for adenopathy.  Does not bruise/bleed easily.  Psychiatric/Behavioral: Negative for behavioral problems, confusion, sleep disturbance, dysphoric mood, decreased concentration and agitation.     OBJECTIVE: Temp:  [97.3 F (36.3 C)-98.9 F (37.2 C)] 98.4 F (36.9 C) (10/20 0549) Pulse Rate:  [60-71] 71 (10/20 0549) Resp:  [13-18] 16 (10/20 0549) BP: (110-135)/(60-87) 131/77 (10/20 0549) SpO2:  [94 %-100 %] 95 % (10/20 0549) Physical Exam  Constitutional: He is oriented to person, place, and time. He appears well-developed and well-nourished. No distress.  HENT:  Mouth/Throat:  Oropharynx is clear and moist. No oropharyngeal exudate.  Cardiovascular: Normal rate, regular rhythm and normal heart sounds. Exam reveals no gallop and no friction rub.  No murmur heard.  Pulmonary/Chest: Effort normal and breath sounds normal. No respiratory distress. He has no wheezes.  Abdominal: Soft. Bowel sounds are normal. He exhibits no distension. There is no tenderness.  Lymphadenopathy:  He has no cervical adenopathy.  Back:surgical dressing in place, no erythema Neurological: He is alert and oriented to person, place, and time.  Skin: Skin is warm and dry. No rash noted. No erythema.  Psychiatric: He has a normal mood and affect. His behavior is normal.     LABS: Results for orders placed or performed during the hospital encounter of 06/29/16 (from the past 48 hour(s))  Urinalysis, Routine w reflex microscopic (not at [facility])     Status: Abnormal   Collection Time: 06/29/16  9:24 AM  Result Value Ref Range   Color, Urine YELLOW YELLOW   APPearance CLOUDY (A) CLEAR   Specific Gravity, Urine 1.036 (H) 1.005 - 1.030   pH 6.0 5.0 - 8.0   Glucose, UA NEGATIVE NEGATIVE mg/dL   Hgb urine dipstick NEGATIVE NEGATIVE   Bilirubin Urine NEGATIVE NEGATIVE   Ketones, ur NEGATIVE NEGATIVE mg/dL   Protein, ur NEGATIVE NEGATIVE mg/dL   Nitrite NEGATIVE NEGATIVE   Leukocytes, UA NEGATIVE NEGATIVE    Comment: MICROSCOPIC NOT DONE ON URINES WITH NEGATIVE PROTEIN, BLOOD, LEUKOCYTES, NITRITE, OR GLUCOSE <1000 mg/dL.  CBC with Differential     Status: Abnormal   Collection Time: 06/29/16  9:45 AM  Result Value Ref Range   WBC 7.8 4.0 - 10.5 K/uL   RBC 4.34 4.22 - 5.81 MIL/uL   Hemoglobin 10.4 (L) 13.0 - 17.0 g/dL   HCT 33.8 (L) 39.0 - 52.0 %   MCV 77.9 (L) 78.0 - 100.0 fL   MCH 24.0 (L) 26.0 - 34.0 pg   MCHC 30.8 30.0 - 36.0 g/dL   RDW 15.0 11.5 - 15.5 %   Platelets 406 (H) 150 - 400 K/uL   Neutrophils Relative % 76 %   Neutro Abs 5.9 1.7 - 7.7 K/uL   Lymphocytes Relative 14 %    Lymphs Abs 1.1 0.7 - 4.0 K/uL   Monocytes Relative 7 %   Monocytes Absolute 0.6 0.1 - 1.0 K/uL   Eosinophils Relative 3 %   Eosinophils Absolute 0.2 0.0 - 0.7 K/uL   Basophils Relative 0 %   Basophils Absolute 0.0 0.0 - 0.1 K/uL  Basic metabolic panel     Status: Abnormal   Collection Time: 06/29/16  9:45 AM  Result Value Ref Range   Sodium 139 135 - 145 mmol/L   Potassium 3.9 3.5 - 5.1 mmol/L   Chloride 105 101 - 111 mmol/L   CO2 24 22 - 32 mmol/L   Glucose, Bld 105 (H) 65 - 99 mg/dL   BUN 15 6 - 20 mg/dL   Creatinine, Ser 0.83 0.61 -  1.24 mg/dL   Calcium 9.1 8.9 - 10.3 mg/dL   GFR calc non Af Amer >60 >60 mL/min   GFR calc Af Amer >60 >60 mL/min    Comment: (NOTE) The eGFR has been calculated using the CKD EPI equation. This calculation has not been validated in all clinical situations. eGFR's persistently <60 mL/min signify possible Chronic Kidney Disease.    Anion gap 10 5 - 15  Sedimentation rate     Status: Abnormal   Collection Time: 06/29/16  1:43 PM  Result Value Ref Range   Sed Rate 58 (H) 0 - 16 mm/hr  C-reactive protein     Status: Abnormal   Collection Time: 06/29/16  1:43 PM  Result Value Ref Range   CRP 16.9 (H) <1.0 mg/dL    Comment: Performed at [place]: Or cx pending IMAGING: Mr Lumbar Spine W Wo Contrast  Result Date: 06/29/2016 CLINICAL DATA:  Low back pain, bilateral leg pain, back surgery 03/30/2016 EXAM: MRI LUMBAR SPINE WITHOUT AND WITH CONTRAST TECHNIQUE: Multiplanar and multiecho pulse sequences of the lumbar spine were obtained without and with intravenous contrast. CONTRAST:  46m MULTIHANCE GADOBENATE DIMEGLUMINE 529 MG/ML IV SOLN COMPARISON:  05/29/2016 FINDINGS: Segmentation:  Standard. Alignment:  Physiologic. Vertebrae: Fluid signal in the L4-5 disc which was previously demonstrated to be desiccated. Severe marrow edema in the L4 and L5 vertebral bodies with endplate irregularity and avid enhancement on post-contrast  imaging which has worsened compared with the prior exam of 05/29/2016. Severe paraspinal soft tissue enhancement and enhancement of the medial aspects of the psoas muscles bilaterally consistent with a phlegmon. No drainable paraspinal fluid collection. Severe epidural enhancement at L4 and L5 anteriorly which has increased compared with the prior examination most consistent with an epidural phlegmon in without a drainable abscess measuring approximately 10 mm in thickness. There is severe posterior paraspinal soft tissue enhancement at L4-5 with an enlarging 13 x 15 mm fluid collection along the right posterolateral aspect of the thecal sac most consistent with an abscess at the site of prior laminectomy. Vertebral body heights are maintained. Conus medullaris: Extends to the L1 level and appears normal. Paraspinal and other soft tissues: No other paraspinal abnormality. Disc levels: Disc spaces: Disc height loss at L4-5. T12-L1: No disc bulge. Mild bilateral facet arthropathy. No evidence of neural foraminal stenosis. No central canal stenosis. L1-L2: Mild broad-based disc bulge. Moderate bilateral facet arthropathy. Mild spinal stenosis. No evidence of neural foraminal stenosis. L2-L3: Mild broad-based disc bulge. Severe bilateral facet arthropathy. Moderate spinal stenosis and lateral recess stenosis. No evidence of neural foraminal stenosis. L3-L4: No significant disc protrusion. Moderate bilateral facet arthropathy. Prior L4 laminectomy defect. No evidence of neural foraminal stenosis. No central canal stenosis. L4-L5: Prior laminectomy with severe abnormal paraspinal soft tissue abnormality as described above. Broad-based disc bulge and epidural phlegmon in resulting resultant severe spinal stenosis. Bilateral mild foraminal stenosis. L5-S1: Mild broad-based disc bulge. Severe bilateral facet arthropathy. No evidence of neural foraminal stenosis. IMPRESSION: 1. Severe discitis-osteomyelitis at L4-5 with  extensive paravertebral phlegmon. Severe posterior paraspinal soft tissue enhancement in the surgical bed of prior L4-5 laminectomy with a enlarging 13 x 15 mm right posterolateral fluid collection consistent with an abscess. Anterior epidural phlegmon along the posterior aspect of the L4-5 vertebral bodies without an epidural abscess resulting in severe spinal stenosis at L4 and L5. Electronically Signed   By: HKathreen Devoid  On: 06/29/2016 12:18   Lab Results  Component  Value Date   ESRSEDRATE 58 (H) 06/29/2016   POCTSEDRATE 75 (A) 06/26/2016   Lab Results  Component Value Date   CRP 16.9 (H) 06/29/2016     Assessment/Plan:  75yo M with discitis, epidural abscess POD#1 from I x D  - will plan to treat with 8 wk of Iv abtx. Await culture results to determine final abtx regimen. Currently on vancomycin plus ceftriaxone 2gm IV daily - will get picc line placed  Neuropathic leg pain = appears improving since decompression and ixd   Health maintenance = will check hiv and hep c  Dr Lucianne Lei dam to provide final recs over the weekend, likely on Sunday. If still here on Monday, will see back at that time.

## 2016-06-30 NOTE — Progress Notes (Signed)
PROGRESS NOTE  Donald [name] [id number] DOB: [birth year] DOA: 06/29/2016 PCP: [name], MD   LOS: 1 day   Brief Narrative: 75 y.o. Male with history of spinal stenosis underwent decompression of L3-L4 and L4-L5 on 03/28/16 by [name]. Continued to have persistent post-operative lower back pain with numbness of lower extremities and difficulties walking. MRI on 05/29/16 suspected postoperative fluid collection versus abscess. Presented to ED 06/29/16 per recommendations of PCP for persistent symptoms. MRI 06/29/16 shows discitis, osteomyelitis, and epidural abscess with spinal stenosis. Patient underwent lumbar wound irrigation and debridement with laminectecomy for decompression at L4-5 on 06/29/16 by [name]. Patient is on vancomycin and ceftriaxone. Currently complains of right leg aching, improved from pain prior to surgery. He also complains of an episode of right foot 9/10 cramping. Denies fever, chills, nausea, vomiting, numbness, tingling, or progressive weakness.   Assessment & Plan: Principal Problem:   Osteomyelitis of lumbar vertebra [facility]) Active Problems:   Epidural abscess   Discitis   Osteomyelitis of lumbar vertebra/epidural abscess/discitis - h/o spinal stenosis, s/p decompression of L3-L4 and L4-L5 on 03/28/16 by [name] - MRI 06/29/16 shows severe discitis-osteomyelitis at L4-5 with extensive paravertebral phlegmon. Severe posterior paraspinal soft tissue enhancement in the surgical bed of prior L4-5 laminectomy with a enlarging 13 x 15 mm right posterolateral fluid collection consistent with an abscess. - On 06/29/16 underwent lumbar wound irrigation and debridement with laminectecomy for decompression at L4-5 by [name]. - preliminary wound culture with rate gram positive coccobacillus, awaiting speciation - On vancomycin and ceftriaxone for now, consulted ID, appreciate input, d/w [name]. Place PICC Line - PT to evaluate as well    Anemia - of chronic disease, stable  DVT prophylaxis: None Code Status: Full Family Communication: Wife at [place] Disposition Plan: home 1-2 days  Consultants:   Orthopedic surgery  Procedures:   None  Antimicrobials:  Vancomycin and ceftriaxone   Subjective: Patient complains of right left dull, achy pain. Patient states this pain is different from the leg spasms he had prior to surgery. He also complains of an episode of right foot 9/10 cramping. Denies fever, chills, nausea, vomiting, numbness, tingling, or progressive weakness.   Objective: Vitals:   06/29/16 2142 06/29/16 2159 06/30/16 0156 06/30/16 0549  BP: 116/69 120/60 110/75 131/77  Pulse: 70 71 70 71  Resp: 15 16 17 16   Temp: 97.3 F (36.3 C) 98.7 F (37.1 C) 98.1 F (36.7 C) 98.4 F (36.9 C)  TempSrc:  Oral Oral Oral  SpO2: 96% 97% 94% 95%    Intake/Output Summary (Last 24 hours) at 06/30/16 1210 Last data filed at 06/30/16 0900  Gross per 24 hour  Intake             3580 ml  Output             1720 ml  Net             1860 ml   There were no vitals filed for this visit.  Examination: Constitutional: NAD Vitals:   06/29/16 2142 06/29/16 2159 06/30/16 0156 06/30/16 0549  BP: 116/69 120/60 110/75 131/77  Pulse: 70 71 70 71  Resp: 15 16 17 16   Temp: 97.3 F (36.3 C) 98.7 F (37.1 C) 98.1 F (36.7 C) 98.4 F (36.9 C)  TempSrc:  Oral Oral Oral  SpO2: 96% 97% 94% 95%   Eyes: lids and conjunctivae normal ENMT: Mucous membranes are moist.  Respiratory: clear to auscultation bilaterally, no wheezing,  no crackles. Normal respiratory effort. No accessory muscle use.  Cardiovascular: Regular rate and rhythm, no murmurs / rubs / gallops. No LE edema. 2+ pedal pulses. Musculoskeletal: no clubbing / cyanosis. No joint deformity upper and lower extremities. No contractures. Normal muscle tone.  Skin: no rashes, lesions, ulcers.  Neurologic: Strength 5/5 in all 4. Mild decrease in sensation on plantar  aspect of right foot. Decreased sensation plantar surface left foot. Hyperesthesia right anterior shin Psychiatric: Normal judgment and insight. Alert and oriented x 3. Normal mood.    Data Reviewed: I have personally reviewed following labs and imaging studies  CBC:  Recent Labs Lab 06/26/16 1333 06/29/16 0945 06/30/16 0945  WBC 9.2 7.8 8.0  NEUTROABS  --  5.9  --   HGB 11.2* 10.4* 8.9*  HCT 34.6* 33.8* 28.6*  MCV 75.3* 77.9* 76.9*  PLT  --  406* [id number]   Basic Metabolic Panel:  Recent Labs Lab 06/29/16 0945 06/30/16 0945  NA 139 138  K 3.9 3.7  CL 105 106  CO2 24 24  GLUCOSE 105* 139*  BUN 15 7  CREATININE 0.83 0.82  CALCIUM 9.1 8.2*   Urine analysis:    Component Value Date/Time   COLORURINE YELLOW 06/29/2016 0924   APPEARANCEUR CLOUDY (A) 06/29/2016 0924   LABSPEC 1.036 (H) 06/29/2016 0924   PHURINE 6.0 06/29/2016 0924   GLUCOSEU NEGATIVE 06/29/2016 0924   HGBUR NEGATIVE 06/29/2016 0924   BILIRUBINUR NEGATIVE 06/29/2016 0924   BILIRUBINUR small (A) 06/26/2016 1335   BILIRUBINUR neg 12/02/2012 1522   [place] 06/29/2016 0924   PROTEINUR NEGATIVE 06/29/2016 0924   UROBILINOGEN 0.2 06/26/2016 1335   UROBILINOGEN 0.2 09/12/2013 0916   NITRITE NEGATIVE 06/29/2016 0924   LEUKOCYTESUR NEGATIVE 06/29/2016 0924   Sepsis Labs: Invalid input(s): PROCALCITONIN, LACTICIDVEN  Recent Results (from the past 240 hour(s))  Blood culture (routine x 2)     Status: None (Preliminary result)   Collection Time: 06/29/16  1:43 PM  Result Value Ref Range Status   Specimen Description BLOOD LEFT HAND  Final   Special Requests BOTTLES DRAWN AEROBIC AND ANAEROBIC 4 CC EA  Final   Culture   Final    NO GROWTH < 24 HOURS Performed at [facility]    Report Status PENDING  Incomplete  Blood culture (routine x 2)     Status: None (Preliminary result)   Collection Time: 06/29/16  1:43 PM  Result Value Ref Range Status   Specimen Description BLOOD RIGHT HAND   Final   Special Requests BOTTLES DRAWN AEROBIC AND ANAEROBIC 5 CC EA  Final   Culture   Final    NO GROWTH < 24 HOURS Performed at [facility]    Report Status PENDING  Incomplete  Aerobic/Anaerobic Culture (surgical/deep wound)     Status: None (Preliminary result)   Collection Time: 06/29/16  6:54 PM  Result Value Ref Range Status   Specimen Description TISSUE LUMBAR FRAGMENTS  Final   Special Requests NONE  Final   Gram Stain   Final    RARE WBC PRESENT, PREDOMINANTLY PMN NO ORGANISMS SEEN    Culture NO GROWTH 1 DAY  Final   Report Status PENDING  Incomplete  Aerobic/Anaerobic Culture (surgical/deep wound)     Status: None (Preliminary result)   Collection Time: 06/29/16  8:05 PM  Result Value Ref Range Status   Specimen Description WOUND  Final   Special Requests LUMBAR IRRIGATION AND DEBRIDMENT  Final   Gram Stain NO  WBC SEEN RARE GRAM POSITIVE COCCOBACILLUS   Final   Culture PENDING  Incomplete   Report Status PENDING  Incomplete      Radiology Studies: Mr Lumbar Spine W Wo Contrast  Result Date: 06/29/2016 CLINICAL DATA:  Low back pain, bilateral leg pain, back surgery 03/30/2016 EXAM: MRI LUMBAR SPINE WITHOUT AND WITH CONTRAST TECHNIQUE: Multiplanar and multiecho pulse sequences of the lumbar spine were obtained without and with intravenous contrast. CONTRAST:  42mL MULTIHANCE GADOBENATE DIMEGLUMINE 529 MG/ML IV SOLN COMPARISON:  05/29/2016 FINDINGS: Segmentation:  Standard. Alignment:  Physiologic. Vertebrae: Fluid signal in the L4-5 disc which was previously demonstrated to be desiccated. Severe marrow edema in the L4 and L5 vertebral bodies with endplate irregularity and avid enhancement on post-contrast imaging which has worsened compared with the prior exam of 05/29/2016. Severe paraspinal soft tissue enhancement and enhancement of the medial aspects of the psoas muscles bilaterally consistent with a phlegmon. No drainable paraspinal fluid collection. Severe  epidural enhancement at L4 and L5 anteriorly which has increased compared with the prior examination most consistent with an epidural phlegmon in without a drainable abscess measuring approximately 10 mm in thickness. There is severe posterior paraspinal soft tissue enhancement at L4-5 with an enlarging 13 x 15 mm fluid collection along the right posterolateral aspect of the thecal sac most consistent with an abscess at the site of prior laminectomy. Vertebral body heights are maintained. Conus medullaris: Extends to the L1 level and appears normal. Paraspinal and other soft tissues: No other paraspinal abnormality. Disc levels: Disc spaces: Disc height loss at L4-5. T12-L1: No disc bulge. Mild bilateral facet arthropathy. No evidence of neural foraminal stenosis. No central canal stenosis. L1-L2: Mild broad-based disc bulge. Moderate bilateral facet arthropathy. Mild spinal stenosis. No evidence of neural foraminal stenosis. L2-L3: Mild broad-based disc bulge. Severe bilateral facet arthropathy. Moderate spinal stenosis and lateral recess stenosis. No evidence of neural foraminal stenosis. L3-L4: No significant disc protrusion. Moderate bilateral facet arthropathy. Prior L4 laminectomy defect. No evidence of neural foraminal stenosis. No central canal stenosis. L4-L5: Prior laminectomy with severe abnormal paraspinal soft tissue abnormality as described above. Broad-based disc bulge and epidural phlegmon in resulting resultant severe spinal stenosis. Bilateral mild foraminal stenosis. L5-S1: Mild broad-based disc bulge. Severe bilateral facet arthropathy. No evidence of neural foraminal stenosis. IMPRESSION: 1. Severe discitis-osteomyelitis at L4-5 with extensive paravertebral phlegmon. Severe posterior paraspinal soft tissue enhancement in the surgical bed of prior L4-5 laminectomy with a enlarging 13 x 15 mm right posterolateral fluid collection consistent with an abscess. Anterior epidural phlegmon along the  posterior aspect of the L4-5 vertebral bodies without an epidural abscess resulting in severe spinal stenosis at L4 and L5. Electronically Signed   By: Kathreen Devoid   On: 06/29/2016 12:18     Scheduled Meds: . cefTRIAXone (ROCEPHIN)  IV  2 g Intravenous Q12H  . docusate sodium  100 mg Oral Daily  . multivitamin with minerals  1 tablet Oral Daily  . sodium chloride flush  3 mL Intravenous Q12H  . vancomycin  750 mg Intravenous Q12H   Continuous Infusions: . sodium chloride 75 mL/hr at 06/30/16 0623  . sodium chloride      Marzetta Board, MD, PhD [facility] Pager [phone]  If 7PM-7AM, please contact night-coverage [email or web address] Password [email or web address] 06/30/2016, 12:10 PM

## 2016-06-30 NOTE — Evaluation (Signed)
Occupational Therapy Evaluation Patient Details Name: Donald [name] MRN: [id number] DOB: Jun 26, 1941 Today's Date: 06/30/2016    History of Present Illness Pt is a 75 y.o. 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   Clinical Impression   PTA, pt required min assist with LB ADL and utilized a RW for functional mobility. Currently pt requires min assist for functional mobility and LB ADL. Educated pt on back precautions during ADL and he would benefit from reinforcement. Pt reports feeling weak in B LE's and did have one instance of LOB upon descent into chair due to L LE "buckling" after ambulating from bathroom requiring mod assist from therapist to safely sit in chair. Pt would benefit from continued OT services while admitted to improve independence with ADL and functional mobility. Recommend home health OT services for OT follow-up post-acute D/C and 24 hour assistance from wife. OT will continue to follow acutely with focus on LB ADL and ADL transfers while adhering to back precautions.     Follow Up Recommendations  Home health OT;Supervision/Assistance - 24 hour    Equipment Recommendations  None recommended by OT       Precautions / Restrictions Precautions Precautions: Back Precaution Booklet Issued: No Precaution Comments: Reviewed back precautions during ADL. Restrictions Weight Bearing Restrictions: No      Mobility Bed Mobility               General bed mobility comments: Out of bed in chair on OT arrival.  Transfers Overall transfer level: Needs assistance Equipment used: Rolling walker (2 wheeled) Transfers: Sit to/from Stand Sit to Stand: Min assist         General transfer comment: Assist to control descent when sitting. Pt with LOB upon lowering into chair due to L knee  buckling. and required min assist to regain balance while sitting.    Balance Overall balance assessment: Needs assistance Sitting-balance support: No upper extremity supported;Feet supported Sitting balance-Leahy Scale: Fair     Standing balance support: Single extremity supported;During functional activity Standing balance-Leahy Scale: Fair Standing balance comment: Able to stand at sink to brush teeth. Requires min assist during dynamic activity to maintain balance.                            ADL Overall ADL's : Needs assistance/impaired     Grooming: Oral care;Standing;Minimal assistance (Cueing for back precautions)   Upper Body Bathing: Sitting;Supervision/ safety (Cueing for back precautions.)   Lower Body Bathing: Minimal assistance;Sit to/from stand   Upper Body Dressing : Supervision/safety;Sitting (Cueing for back precautions)   Lower Body Dressing: Minimal assistance;Sit to/from stand   Toilet Transfer: Minimal assistance;BSC;Ambulation   Toileting- Clothing Manipulation and Hygiene: Minimal assistance;Sit to/from stand Toileting - Clothing Manipulation Details (indicate cue type and reason): Min assist to maintain balance.     Functional mobility during ADLs: Minimal assistance;Rolling walker General ADL Comments: Educated pt on back precautions during ADL, safe use of RW, dressing techniques, discussed log rolling, and safe toilet transfer.               Pertinent Vitals/Pain Pain Assessment: Faces Faces Pain Scale: Hurts even more Pain Location: back Pain Descriptors / Indicators: Aching;Spasm Pain Intervention(s): Limited activity within patient's tolerance;Monitored during session     Hand Dominance Right   Extremity/Trunk Assessment  Upper Extremity Assessment Upper Extremity Assessment: Overall WFL for tasks assessed   Lower Extremity Assessment Lower Extremity Assessment: Generalized weakness       Communication  Communication Communication: No difficulties   Cognition Arousal/Alertness: Awake/alert Behavior During Therapy: WFL for tasks assessed/performed Overall Cognitive Status: Within Functional Limits for tasks assessed                                Home Living Family/patient expects to be discharged to:: Private residence Living Arrangements: Spouse/significant other Available Help at Discharge: Family;Available 24 hours/day Type of Home: House Home Access: Stairs to enter [employer] of Steps: 2 Entrance Stairs-Rails: Left Home Layout: One level     Bathroom Shower/Tub: Occupational psychologist: Standard Bathroom Accessibility: Yes How Accessible: Accessible via walker Home Equipment: [place] - 2 wheels;Cane - single point;Crutches;Bedside commode;Shower seat          Prior Functioning/Environment Level of Independence: Needs assistance  Gait / Transfers Assistance Needed: Using RW ADL's / Homemaking Assistance Needed: Needs min assist at times for LB dressing            OT Problem List: Decreased strength;Decreased range of motion;Decreased activity tolerance;Impaired balance (sitting and/or standing);Decreased knowledge of use of DME or AE;Decreased safety awareness;Decreased knowledge of precautions;Pain   OT Treatment/Interventions: Self-care/ADL training;Therapeutic exercise;DME and/or AE instruction;Therapeutic activities;Patient/family education;Balance training;Energy conservation    OT Goals(Current goals can be found in the care plan section) Acute Rehab OT Goals Patient Stated Goal: be able to walk and move without pain OT Goal Formulation: With patient/family Time For Goal Achievement: 07/14/16 Potential to Achieve Goals: Good ADL Goals Pt Will Perform Upper Body Bathing: with modified independence;sitting Pt Will Perform Lower Body Bathing: with supervision;with adaptive equipment;sit to/from stand Pt Will Perform Upper Body  Dressing: with modified independence;sitting Pt Will Perform Lower Body Dressing: with supervision;with adaptive equipment;sit to/from stand Pt Will Transfer to Toilet: with supervision;ambulating;bedside commode  OT Frequency: Min 2X/week    End of Session Equipment Utilized During Treatment: Gait belt;Rolling walker Nurse Communication: Mobility status  Activity Tolerance: Patient tolerated treatment well Patient left: in chair;with call bell/phone within reach;with family/visitor present   Time: 1444-1520 OT Time Calculation (min): 36 min Charges:  OT General Charges $OT Visit: 1 Procedure OT Evaluation $OT Eval Moderate Complexity: 1 Procedure OT Treatments $Self Care/Home Management : 8-22 mins  Norman Herrlich, OTR/L [phone] 06/30/2016, 3:36 PM

## 2016-07-01 DIAGNOSIS — A498 Other bacterial infections of unspecified site: Secondary | ICD-10-CM

## 2016-07-01 DIAGNOSIS — M8618 Other acute osteomyelitis, other site: Secondary | ICD-10-CM

## 2016-07-01 DIAGNOSIS — B9689 Other specified bacterial agents as the cause of diseases classified elsewhere: Secondary | ICD-10-CM

## 2016-07-01 DIAGNOSIS — M4646 Discitis, unspecified, lumbar region: Secondary | ICD-10-CM

## 2016-07-01 DIAGNOSIS — Z9889 Other specified postprocedural states: Secondary | ICD-10-CM

## 2016-07-01 LAB — CBC
HEMATOCRIT: 26.9 % — AB (ref 39.0–52.0)
Hemoglobin: 8.3 g/dL — ABNORMAL LOW (ref 13.0–17.0)
MCH: 23.8 pg — AB (ref 26.0–34.0)
MCHC: 30.9 g/dL (ref 30.0–36.0)
MCV: 77.1 fL — AB (ref 78.0–100.0)
PLATELETS: 332 10*3/uL (ref 150–400)
RBC: 3.49 MIL/uL — ABNORMAL LOW (ref 4.22–5.81)
RDW: 15.1 % (ref 11.5–15.5)
WBC: 7.5 10*3/uL (ref 4.0–10.5)

## 2016-07-01 MED ORDER — SODIUM CHLORIDE 0.9% FLUSH: 10.0000 mL | INTRAVENOUS | Status: DC | PRN

## 2016-07-01 MED ORDER — SODIUM CHLORIDE 0.9% FLUSH
10.0000 mL | Freq: Two times a day (BID) | INTRAVENOUS | Status: DC
  Administered 2016-07-01 – 2016-07-02 (×3): 10 mL

## 2016-07-01 NOTE — Progress Notes (Signed)
PROGRESS NOTE  Donald [name] [id number] DOB: [birth year] DOA: 06/29/2016 PCP: [name], MD   LOS: 2 days   Brief Narrative: 75 y.o. Male with history of spinal stenosis underwent decompression of L3-L4 and L4-L5 on 03/28/16 by [name]. Continued to have persistent post-operative lower back pain with numbness of lower extremities and difficulties walking. MRI on 05/29/16 suspected postoperative fluid collection versus abscess. Presented to ED 06/29/16 per recommendations of PCP for persistent symptoms. MRI 06/29/16 shows discitis, osteomyelitis, and epidural abscess with spinal stenosis. Patient underwent lumbar wound irrigation and debridement with laminectecomy for decompression at L4-5 on 06/29/16 by [name]. Patient is on vancomycin and ceftriaxone. Currently complains of right leg aching, improved from pain prior to surgery. He also complains of an episode of right foot 9/10 cramping. Denies fever, chills, nausea, vomiting, numbness, tingling, or progressive weakness.   Assessment & Plan: Principal Problem:   Osteomyelitis of lumbar vertebra [facility]) Active Problems:   Epidural abscess   Discitis   Osteomyelitis of lumbar vertebra/epidural abscess/discitis - h/o spinal stenosis, s/p decompression of L3-L4 and L4-L5 on 03/28/16 by [name] - MRI 06/29/16 shows severe discitis-osteomyelitis at L4-5 with extensive paravertebral phlegmon. Severe posterior paraspinal soft tissue enhancement in the surgical bed of prior L4-5 laminectomy with a enlarging 13 x 15 mm right posterolateral fluid collection consistent with an abscess. - On 06/29/16 underwent lumbar wound irrigation and debridement with laminectecomy for decompression at L4-5 by [name]. - preliminary wound culture with rate gram positive coccobacillus, awaiting speciation - On vancomycin and ceftriaxone for now, consulted ID, appreciate input, d/w [name]. Placed PICC Line - Currently awaiting  microbiology  Anemia - of chronic disease, stable  DVT prophylaxis: None Code Status: Full Family Communication: Wife and son at [place] Disposition Plan: home 1-2 days  Consultants:   Orthopedic surgery  Infectious disease  Procedures:   None  Antimicrobials:  Vancomycin and ceftriaxone   Subjective: - back pain is better today, no chest pain, no dyspnea  Objective: Vitals:   06/30/16 1500 06/30/16 2056 07/01/16 0457 07/01/16 1346  BP: 134/72 113/73 123/80 127/63  Pulse: 77 66 65 72  Resp: 16 16 16 16   Temp: 98.2 F (36.8 C) 99 F (37.2 C) 98.2 F (36.8 C) 98.6 F (37 C)  TempSrc: Oral Oral Oral Oral  SpO2: 95% 96% 96% 96%    Intake/Output Summary (Last 24 hours) at 07/01/16 1443 Last data filed at 07/01/16 0730  Gross per 24 hour  Intake              630 ml  Output             1405 ml  Net             -775 ml   There were no vitals filed for this visit.  Examination: Constitutional: NAD Vitals:   06/30/16 1500 06/30/16 2056 07/01/16 0457 07/01/16 1346  BP: 134/72 113/73 123/80 127/63  Pulse: 77 66 65 72  Resp: 16 16 16 16   Temp: 98.2 F (36.8 C) 99 F (37.2 C) 98.2 F (36.8 C) 98.6 F (37 C)  TempSrc: Oral Oral Oral Oral  SpO2: 95% 96% 96% 96%   Respiratory: clear to auscultation bilaterally, no wheezing, no crackles Cardiovascular: Regular rate and rhythm, no murmurs / rubs / gallops. Skin: no rashes, lesions, ulcers.  Neurologic: non focal   Data Reviewed: I have personally reviewed following labs and imaging studies  CBC:  Recent Labs Lab 06/26/16  1333 06/29/16 0945 06/30/16 0945 07/01/16 0500  WBC 9.2 7.8 8.0 7.5  NEUTROABS  --  5.9  --   --   HGB 11.2* 10.4* 8.9* 8.3*  HCT 34.6* 33.8* 28.6* 26.9*  MCV 75.3* 77.9* 76.9* 77.1*  PLT  --  406* 364 [id number]   Basic Metabolic Panel:  Recent Labs Lab 06/29/16 0945 06/30/16 0945  NA 139 138  K 3.9 3.7  CL 105 106  CO2 24 24  GLUCOSE 105* 139*  BUN 15 7  CREATININE 0.83 0.82   CALCIUM 9.1 8.2*   Urine analysis:    Component Value Date/Time   COLORURINE YELLOW 06/29/2016 0924   APPEARANCEUR CLOUDY (A) 06/29/2016 0924   LABSPEC 1.036 (H) 06/29/2016 0924   PHURINE 6.0 06/29/2016 0924   GLUCOSEU NEGATIVE 06/29/2016 0924   HGBUR NEGATIVE 06/29/2016 0924   BILIRUBINUR NEGATIVE 06/29/2016 0924   BILIRUBINUR small (A) 06/26/2016 1335   BILIRUBINUR neg 12/02/2012 1522   [place] 06/29/2016 0924   PROTEINUR NEGATIVE 06/29/2016 0924   UROBILINOGEN 0.2 06/26/2016 1335   UROBILINOGEN 0.2 09/12/2013 0916   NITRITE NEGATIVE 06/29/2016 0924   LEUKOCYTESUR NEGATIVE 06/29/2016 0924   Sepsis Labs: Invalid input(s): PROCALCITONIN, LACTICIDVEN  Recent Results (from the past 240 hour(s))  Blood culture (routine x 2)     Status: None (Preliminary result)   Collection Time: 06/29/16  1:43 PM  Result Value Ref Range Status   Specimen Description BLOOD LEFT HAND  Final   Special Requests BOTTLES DRAWN AEROBIC AND ANAEROBIC 4 CC EA  Final   Culture   Final    NO GROWTH 2 DAYS Performed at [facility]    Report Status PENDING  Incomplete  Blood culture (routine x 2)     Status: None (Preliminary result)   Collection Time: 06/29/16  1:43 PM  Result Value Ref Range Status   Specimen Description BLOOD RIGHT HAND  Final   Special Requests BOTTLES DRAWN AEROBIC AND ANAEROBIC 5 CC EA  Final   Culture   Final    NO GROWTH 2 DAYS Performed at [facility]    Report Status PENDING  Incomplete  Aerobic/Anaerobic Culture (surgical/deep wound)     Status: None (Preliminary result)   Collection Time: 06/29/16  6:54 PM  Result Value Ref Range Status   Specimen Description TISSUE LUMBAR FRAGMENTS  Final   Special Requests NONE  Final   Gram Stain   Final    RARE WBC PRESENT, PREDOMINANTLY PMN NO ORGANISMS SEEN    Culture   Final    NO GROWTH 2 DAYS NO ANAEROBES ISOLATED; CULTURE IN PROGRESS FOR 5 DAYS   Report Status PENDING  Incomplete    Aerobic/Anaerobic Culture (surgical/deep wound)     Status: None (Preliminary result)   Collection Time: 06/29/16  8:05 PM  Result Value Ref Range Status   Specimen Description WOUND  Final   Special Requests LUMBAR IRRIGATION AND DEBRIDMENT  Final   Gram Stain NO WBC SEEN RARE GRAM POSITIVE COCCOBACILLUS   Final   Culture NO GROWTH 1 DAY  Final   Report Status PENDING  Incomplete  MRSA PCR Screening     Status: None   Collection Time: 06/30/16  5:33 PM  Result Value Ref Range Status   MRSA by PCR NEGATIVE NEGATIVE Final    Comment:        The GeneXpert MRSA Assay (FDA approved for NASAL specimens only), is one component of a comprehensive MRSA colonization surveillance program.  It is not intended to diagnose MRSA infection nor to guide or monitor treatment for MRSA infections.       Radiology Studies: No results found.   Scheduled Meds: . docusate sodium  100 mg Oral Daily  . multivitamin with minerals  1 tablet Oral Daily  . sodium chloride flush  10-40 mL Intracatheter Q12H  . sodium chloride flush  3 mL Intravenous Q12H  . vancomycin  750 mg Intravenous Q12H   Continuous Infusions: . sodium chloride Stopped (06/30/16 2247)  . sodium chloride      Marzetta Board, MD, PhD [facility] Pager [phone]  If 7PM-7AM, please contact night-coverage [email or web address] Password [email or web address] 07/01/2016, 2:43 PM

## 2016-07-01 NOTE — Progress Notes (Signed)
Subjective:  Did better with PT today, still with severe back pain   Antibiotics:  Anti-infectives    Start     Dose/Rate Route Frequency Ordered Stop   06/30/16 1100  cefTRIAXone (ROCEPHIN) 2 g in dextrose 5 % 50 mL IVPB  Status:  Discontinued     2 g 100 mL/hr over 30 Minutes Intravenous Every 12 hours 06/30/16 0939 07/01/16 1007   06/29/16 2300  piperacillin-tazobactam (ZOSYN) IVPB 3.375 g  Status:  Discontinued     3.375 g 12.5 mL/hr over 240 Minutes Intravenous Every 8 hours 06/29/16 1738 06/29/16 2223   06/29/16 2300  vancomycin (VANCOCIN) IVPB 750 mg/150 ml premix     750 mg 150 mL/hr over 60 Minutes Intravenous Every 12 hours 06/29/16 1738     06/29/16 2230  cefTRIAXone (ROCEPHIN) 1 g in dextrose 5 % 50 mL IVPB  Status:  Discontinued     1 g 100 mL/hr over 30 Minutes Intravenous Every 12 hours 06/29/16 2223 06/30/16 0939   06/29/16 1300  vancomycin (VANCOCIN) IVPB 1000 mg/200 mL premix     1,000 mg 200 mL/hr over 60 Minutes Intravenous  Once 06/29/16 1248 06/29/16 1446   06/29/16 1300  piperacillin-tazobactam (ZOSYN) IVPB 3.375 g     3.375 g 12.5 mL/hr over 240 Minutes Intravenous  Once 06/29/16 1248 06/29/16 1601      Medications: Scheduled Meds: . docusate sodium  100 mg Oral Daily  . multivitamin with minerals  1 tablet Oral Daily  . sodium chloride flush  10-40 mL Intracatheter Q12H  . sodium chloride flush  3 mL Intravenous Q12H  . vancomycin  750 mg Intravenous Q12H   Continuous Infusions: . sodium chloride Stopped (06/30/16 2247)  . sodium chloride     PRN Meds:.acetaminophen **OR** acetaminophen, alum & mag hydroxide-simeth, bisacodyl, diazepam, menthol-cetylpyridinium **OR** phenol, morphine injection, ondansetron (ZOFRAN) IV, oxyCODONE-acetaminophen, polyethylene glycol, senna-docusate, sodium chloride flush, sodium chloride flush, sodium phosphate, zolpidem, zolpidem    Objective: Weight change:   Intake/Output Summary (Last 24 hours)  at 07/01/16 1833 Last data filed at 07/01/16 0730  Gross per 24 hour  Intake              150 ml  Output             1405 ml  Net            -1255 ml   Blood pressure 127/63, pulse 72, temperature 98.6 F (37 C), temperature source Oral, resp. rate 16, SpO2 96 %. Temp:  [98.2 F (36.8 C)-99 F (37.2 C)] 98.6 F (37 C) (10/21 1346) Pulse Rate:  [65-72] 72 (10/21 1346) Resp:  [16] 16 (10/21 1346) BP: (113-127)/(63-80) 127/63 (10/21 1346) SpO2:  [96 %] 96 % (10/21 1346)  Physical Exam: General: Alert and awake, oriented x3, not in any acute distress. HEENT: anicteric sclera,  EOMI CVS regular rate, normal r,  no murmur rubs or gallops Chest: clear to auscultation bilaterally, no wheezing, rales or rhonchi Abdomen: soft nontender, nondistended, normal bowel sounds, Extremities/skin: no rashes  Neuro: nonfocal  CBC:  CBC Latest Ref Rng & Units 07/01/2016 06/30/2016 06/29/2016  WBC 4.0 - 10.5 K/uL 7.5 8.0 7.8  Hemoglobin 13.0 - 17.0 g/dL 8.3(L) 8.9(L) 10.4(L)  Hematocrit 39.0 - 52.0 % 26.9(L) 28.6(L) 33.8(L)  Platelets 150 - 400 K/uL 332 364 406(H)      BMET  Recent Labs  06/29/16 0945 06/30/16 0945  NA 139 138  K 3.9  3.7  CL 105 106  CO2 24 24  GLUCOSE 105* 139*  BUN 15 7  CREATININE 0.83 0.82  CALCIUM 9.1 8.2*     Liver Panel  No results for input(s): PROT, ALBUMIN, AST, ALT, ALKPHOS, BILITOT, BILIDIR, IBILI in the last 72 hours.     Sedimentation Rate  Recent Labs  06/29/16 1343  ESRSEDRATE 58*   C-Reactive Protein  Recent Labs  06/29/16 1343  CRP 16.9*    Micro Results: Recent Results (from the past 720 hour(s))  Blood culture (routine x 2)     Status: None (Preliminary result)   Collection Time: 06/29/16  1:43 PM  Result Value Ref Range Status   Specimen Description BLOOD LEFT HAND  Final   Special Requests BOTTLES DRAWN AEROBIC AND ANAEROBIC 4 CC EA  Final   Culture   Final    NO GROWTH 2 DAYS Performed at [facility]     Report Status PENDING  Incomplete  Blood culture (routine x 2)     Status: None (Preliminary result)   Collection Time: 06/29/16  1:43 PM  Result Value Ref Range Status   Specimen Description BLOOD RIGHT HAND  Final   Special Requests BOTTLES DRAWN AEROBIC AND ANAEROBIC 5 CC EA  Final   Culture   Final    NO GROWTH 2 DAYS Performed at [facility]    Report Status PENDING  Incomplete  Aerobic/Anaerobic Culture (surgical/deep wound)     Status: None (Preliminary result)   Collection Time: 06/29/16  6:54 PM  Result Value Ref Range Status   Specimen Description TISSUE LUMBAR FRAGMENTS  Final   Special Requests NONE  Final   Gram Stain   Final    RARE WBC PRESENT, PREDOMINANTLY PMN NO ORGANISMS SEEN    Culture   Final    NO GROWTH 2 DAYS NO ANAEROBES ISOLATED; CULTURE IN PROGRESS FOR 5 DAYS   Report Status PENDING  Incomplete  Aerobic/Anaerobic Culture (surgical/deep wound)     Status: None (Preliminary result)   Collection Time: 06/29/16  8:05 PM  Result Value Ref Range Status   Specimen Description WOUND  Final   Special Requests LUMBAR IRRIGATION AND DEBRIDMENT  Final   Gram Stain NO WBC SEEN RARE GRAM POSITIVE COCCOBACILLUS   Final   Culture NO GROWTH 1 DAY  Final   Report Status PENDING  Incomplete  MRSA PCR Screening     Status: None   Collection Time: 06/30/16  5:33 PM  Result Value Ref Range Status   MRSA by PCR NEGATIVE NEGATIVE Final    Comment:        The GeneXpert MRSA Assay (FDA approved for NASAL specimens only), is one component of a comprehensive MRSA colonization surveillance program. It is not intended to diagnose MRSA infection nor to guide or monitor treatment for MRSA infections.     Studies/Results: No results found.    Assessment/Plan:  INTERVAL HISTORY:  cxs ngtd on operative cx   Principal Problem:   Osteomyelitis of lumbar vertebra (HCC) Active Problems:   Epidural abscess   Discitis    Donald [name] is a 75 y.o.  male with discitis, epidural abscess POD#2  from I x D  #1 Vertebral osteomyelitis, diskitis, epidural abscess:  GS showed GP Cocco bacilli but cultures have NGTD. Note he DID get IV vancomycin and a few doses of zosyn prior to surgery so these could have compromised the culture data and I dont know if swabs or actual  pus,tissue bone were sent for cultures  I have narrowed him to IV vancomycin placing faith in the Gram Stain  HOPEFULLY something will grow to give a more definiitive pathogen  He will need 6-8 weeks of parenteral therapy         LOS: 2 days   Alcide Evener 07/01/2016, 6:33 PM

## 2016-07-01 NOTE — Progress Notes (Signed)
Physical Therapy Treatment Patient Details Name: Donald [name] MRN: [id number] DOB: 10/13/1940 Today's Date: 07/01/2016    History of Present Illness Pt is a 75 y.o. 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 [id number] stenosis, prostate cancer, lumbar laminectomy, Lt THA, anterior cervical fusion.    PT Comments    Pt performed increased gait from previous session, patient fearful of falling due to knee instability.  PTA able to build confidence during session and improve gait distance.  Will continue to follow during acute hospitalization.    Follow Up Recommendations  Home health PT;Supervision for mobility/OOB     Equipment Recommendations  None recommended by PT    Recommendations for Other Services       Precautions / Restrictions Precautions Precautions: Back Precaution Comments: Reviewed back precautions during ADL. Restrictions Weight Bearing Restrictions: No    Mobility  Bed Mobility Overal bed mobility: Needs Assistance Bed Mobility: Rolling;Sidelying to Sit Rolling: Min assist Sidelying to sit: Min assist       General bed mobility comments: HOB elevated and assist LEs while maintaining spinal precautions.    Transfers Overall transfer level: Needs assistance Equipment used: Rolling walker (2 wheeled) Transfers: Sit to/from Stand Sit to Stand: Min assist         General transfer comment: Slow ascent for seated surface, pt guarded during session.    Ambulation/Gait Ambulation/Gait assistance: Min assist Ambulation Distance (Feet): 48 Feet Assistive device: Rolling walker (2 wheeled) Gait Pattern/deviations: Step-through pattern;Trunk flexed;Shuffle;Antalgic Gait velocity: slow pattern Gait velocity interpretation: Below normal speed for age/gender General Gait Details: Pt required cues for knee extension in stance phase on both sides. Pt feaful that his  knees will buckle again.  No buckling noted but instability remains.     Stairs            Wheelchair Mobility    Modified Rankin (Stroke Patients Only)       Balance Overall balance assessment: Needs assistance   Sitting balance-Leahy Scale: Fair       Standing balance-Leahy Scale: Fair                      Cognition Arousal/Alertness: Awake/alert Behavior During Therapy: WFL for tasks assessed/performed Overall Cognitive Status: Within Functional Limits for tasks assessed                      Exercises      General Comments        Pertinent Vitals/Pain Pain Assessment: Faces Faces Pain Scale: Hurts even more Pain Location: back Pain Descriptors / Indicators: Aching;Sharp Pain Intervention(s): Monitored during session;Repositioned;Ice applied    Home Living                      Prior Function            PT Goals (current goals can now be found in the care plan section) Acute Rehab PT Goals Patient Stated Goal: be able to walk and move without pain Potential to Achieve Goals: Good Progress towards PT goals: Progressing toward goals    Frequency    Min 5X/week      PT Plan Current plan remains appropriate    Co-evaluation             End of Session Equipment Utilized During Treatment: Gait belt Activity Tolerance: Patient tolerated treatment well;Patient limited by fatigue Patient  left: in chair;with call bell/phone within reach;with family/visitor present     Time: 1251-1320 PT Time Calculation (min) (ACUTE ONLY): 29 min  Charges:  $Gait Training: 8-22 mins $Therapeutic Activity: 8-22 mins                    G Codes:      Cristela Blue 07-15-16, 1:31 PM  Governor Rooks, PTA pager [phone]

## 2016-07-01 NOTE — Progress Notes (Addendum)
    Patient s/p lumbar wound I and D States that L leg pain is largely resolved, but still with some episodic gripping pain into the thigh without actual muscular contraction or spasm He continues to have some milder right leg pain too, not worsening Obviously has back pain as well  C/o weakness in the legs, and lack of confidence with mobility--had PT yesterday but not yet today PICC placed last night.   Physical Exam: Vitals:   06/30/16 2056 07/01/16 0457  BP: 113/73 123/80  Pulse: 66 65  Resp: 16 16  Temp: 99 F (37.2 C) 98.2 F (36.8 C)    Dressing in place NVI  Drain output:30 cc last 8 hours  POD #2 s/p I and D of lumbar wound, with improved R leg pain  - up OOB to chair with RN and with PT/OT, encourage ambulation - Percocet for pain, Valium for muscle spasms - [name] providing ID care--surgical cxs pending - Discharge patient per ID recs, cont Vanc and Ceftriaxone for now - will follow over weekend, [name] returning on Monday

## 2016-07-02 LAB — SEDIMENTATION RATE: Sed Rate: 58 mm/hr — ABNORMAL HIGH (ref 0–16)

## 2016-07-02 LAB — HIV ANTIBODY (ROUTINE TESTING W REFLEX): HIV SCREEN 4TH GENERATION: NONREACTIVE

## 2016-07-02 LAB — C-REACTIVE PROTEIN: CRP: 14.2 mg/dL — AB (ref <1.0)

## 2016-07-02 NOTE — Progress Notes (Signed)
    Patient s/p lumbar wound I and D Leg pain improved from preop.  Sitting up in chair, has been up walking.   Physical Exam: Vitals:   07/01/16 2024 07/02/16 0657  BP: (!) 144/86 115/64  Pulse: 71 66  Resp: 16 16  Temp: 99.1 F (37.3 C) 98.2 F (36.8 C)    Dressing in place NVI  Drain output:20 cc last 24 hours  POD #3 s/p I and D of lumbar wound  - up OOB to chair with RN and with PT/OT, encourage ambulation - drain d/c'd - Percocet for pain, Valium for muscle spasms - On Vanc--ID directing antibiotic plan - will follow over weekend, [name] returning on Monday

## 2016-07-02 NOTE — Progress Notes (Signed)
PROGRESS NOTE  Donald [name] [id number] DOB: 10/06/1940 DOA: 06/29/2016 PCP: [name], MD   LOS: 3 days   Brief Narrative: 75 y.o. Male with history of spinal stenosis underwent decompression of L3-L4 and L4-L5 on 03/28/16 by [name]. Continued to have persistent post-operative lower back pain with numbness of lower extremities and difficulties walking. MRI on 05/29/16 suspected postoperative fluid collection versus abscess. Presented to ED 06/29/16 per recommendations of PCP for persistent symptoms. MRI 06/29/16 shows discitis, osteomyelitis, and epidural abscess with spinal stenosis. Patient underwent lumbar wound irrigation and debridement with laminectecomy for decompression at L4-5 on 06/29/16 by [name]. Patient is on vancomycin and ceftriaxone. Currently complains of right leg aching, improved from pain prior to surgery. He also complains of an episode of right foot 9/10 cramping. Denies fever, chills, nausea, vomiting, numbness, tingling, or progressive weakness.   Assessment & Plan: Principal Problem:   Osteomyelitis of lumbar vertebra [facility]) Active Problems:   Epidural abscess   Discitis   Other acute osteomyelitis, other site (HCC)   Sequela of Corynebacterium infection   Osteomyelitis of lumbar vertebra/epidural abscess/discitis - h/o spinal stenosis, s/p decompression of L3-L4 and L4-L5 on 03/28/16 by [name] - MRI 06/29/16 shows severe discitis-osteomyelitis at L4-5 with extensive paravertebral phlegmon. Severe posterior paraspinal soft tissue enhancement in the surgical bed of prior L4-5 laminectomy with a enlarging 13 x 15 mm right posterolateral fluid collection consistent with an abscess. - On 06/29/16 underwent lumbar wound irrigation and debridement with laminectecomy for decompression at L4-5 by [name]. - preliminary wound culture with rate gram positive coccobacillus, awaiting speciation - On vancomycin only now, discussed with Dr.  Drucilla Schmidt - Currently awaiting microbiology  Anemia - of chronic disease, stable  DVT prophylaxis: None Code Status: Full Family Communication: Wife and son at [place] Disposition Plan: home 1-2 days  Consultants:   Orthopedic surgery  Infectious disease  Procedures:   None  Antimicrobials:  Vancomycin and ceftriaxone   Subjective: - back pain is better today, no chest pain, no dyspnea  Objective: Vitals:   07/01/16 0457 07/01/16 1346 07/01/16 2024 07/02/16 0657  BP: 123/80 127/63 (!) 144/86 115/64  Pulse: 65 72 71 66  Resp: 16 16 16 16   Temp: 98.2 F (36.8 C) 98.6 F (37 C) 99.1 F (37.3 C) 98.2 F (36.8 C)  TempSrc: Oral Oral Oral Oral  SpO2: 96% 96% 96% 93%    Intake/Output Summary (Last 24 hours) at 07/02/16 1142 Last data filed at 07/02/16 0500  Gross per 24 hour  Intake                0 ml  Output             1220 ml  Net            -1220 ml   There were no vitals filed for this visit.  Examination: Constitutional: NAD Vitals:   07/01/16 0457 07/01/16 1346 07/01/16 2024 07/02/16 0657  BP: 123/80 127/63 (!) 144/86 115/64  Pulse: 65 72 71 66  Resp: 16 16 16 16   Temp: 98.2 F (36.8 C) 98.6 F (37 C) 99.1 F (37.3 C) 98.2 F (36.8 C)  TempSrc: Oral Oral Oral Oral  SpO2: 96% 96% 96% 93%   Respiratory: clear to auscultation bilaterally, no wheezing, no crackles Cardiovascular: Regular rate and rhythm, no murmurs / rubs / gallops. Skin: no rashes, lesions, ulcers.  Neurologic: non focal   Data Reviewed: I have personally reviewed following labs and  imaging studies  CBC:  Recent Labs Lab 06/26/16 1333 06/29/16 0945 06/30/16 0945 07/01/16 0500  WBC 9.2 7.8 8.0 7.5  NEUTROABS  --  5.9  --   --   HGB 11.2* 10.4* 8.9* 8.3*  HCT 34.6* 33.8* 28.6* 26.9*  MCV 75.3* 77.9* 76.9* 77.1*  PLT  --  406* 364 [id number]   Basic Metabolic Panel:  Recent Labs Lab 06/29/16 0945 06/30/16 0945  NA 139 138  K 3.9 3.7  CL 105 106  CO2 24 24  GLUCOSE  105* 139*  BUN 15 7  CREATININE 0.83 0.82  CALCIUM 9.1 8.2*   Urine analysis:    Component Value Date/Time   COLORURINE YELLOW 06/29/2016 0924   APPEARANCEUR CLOUDY (A) 06/29/2016 0924   LABSPEC 1.036 (H) 06/29/2016 0924   PHURINE 6.0 06/29/2016 0924   GLUCOSEU NEGATIVE 06/29/2016 0924   HGBUR NEGATIVE 06/29/2016 0924   BILIRUBINUR NEGATIVE 06/29/2016 0924   BILIRUBINUR small (A) 06/26/2016 1335   BILIRUBINUR neg 12/02/2012 1522   [place] 06/29/2016 0924   PROTEINUR NEGATIVE 06/29/2016 0924   UROBILINOGEN 0.2 06/26/2016 1335   UROBILINOGEN 0.2 09/12/2013 0916   NITRITE NEGATIVE 06/29/2016 0924   LEUKOCYTESUR NEGATIVE 06/29/2016 0924   Sepsis Labs: Invalid input(s): PROCALCITONIN, LACTICIDVEN  Recent Results (from the past 240 hour(s))  Blood culture (routine x 2)     Status: None (Preliminary result)   Collection Time: 06/29/16  1:43 PM  Result Value Ref Range Status   Specimen Description BLOOD LEFT HAND  Final   Special Requests BOTTLES DRAWN AEROBIC AND ANAEROBIC 4 CC EA  Final   Culture   Final    NO GROWTH 2 DAYS Performed at [facility]    Report Status PENDING  Incomplete  Blood culture (routine x 2)     Status: None (Preliminary result)   Collection Time: 06/29/16  1:43 PM  Result Value Ref Range Status   Specimen Description BLOOD RIGHT HAND  Final   Special Requests BOTTLES DRAWN AEROBIC AND ANAEROBIC 5 CC EA  Final   Culture   Final    NO GROWTH 2 DAYS Performed at [facility]    Report Status PENDING  Incomplete  Aerobic/Anaerobic Culture (surgical/deep wound)     Status: None (Preliminary result)   Collection Time: 06/29/16  6:54 PM  Result Value Ref Range Status   Specimen Description TISSUE LUMBAR FRAGMENTS  Final   Special Requests NONE  Final   Gram Stain   Final    RARE WBC PRESENT, PREDOMINANTLY PMN NO ORGANISMS SEEN    Culture   Final    NO GROWTH 2 DAYS NO ANAEROBES ISOLATED; CULTURE IN PROGRESS FOR 5 DAYS    Report Status PENDING  Incomplete  Aerobic/Anaerobic Culture (surgical/deep wound)     Status: None (Preliminary result)   Collection Time: 06/29/16  8:05 PM  Result Value Ref Range Status   Specimen Description WOUND  Final   Special Requests LUMBAR IRRIGATION AND DEBRIDMENT  Final   Gram Stain NO WBC SEEN RARE GRAM POSITIVE COCCOBACILLUS   Final   Culture NO GROWTH 1 DAY  Final   Report Status PENDING  Incomplete  MRSA PCR Screening     Status: None   Collection Time: 06/30/16  5:33 PM  Result Value Ref Range Status   MRSA by PCR NEGATIVE NEGATIVE Final    Comment:        The GeneXpert MRSA Assay (FDA approved for NASAL specimens only), is one  component of a comprehensive MRSA colonization surveillance program. It is not intended to diagnose MRSA infection nor to guide or monitor treatment for MRSA infections.       Radiology Studies: No results found.   Scheduled Meds: . docusate sodium  100 mg Oral Daily  . multivitamin with minerals  1 tablet Oral Daily  . sodium chloride flush  10-40 mL Intracatheter Q12H  . sodium chloride flush  3 mL Intravenous Q12H  . vancomycin  750 mg Intravenous Q12H   Continuous Infusions: . sodium chloride 20 mL (07/02/16 0807)  . sodium chloride      Marzetta Board, MD, PhD [facility] Pager [phone]  If 7PM-7AM, please contact night-coverage [email or web address] Password [email or web address] 07/02/2016, 11:42 AM

## 2016-07-02 NOTE — Progress Notes (Signed)
Physical Therapy Treatment Patient Details Name: Donald [name] MRN: [id number] DOB: 11-10-1940 Today's Date: 07/02/2016    History of Present Illness Pt is a 75 y.o. 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 [id number] stenosis, prostate cancer, lumbar laminectomy, Lt THA, anterior cervical fusion.    PT Comments    Pt performed increased mobility and advanced gait distance.  Knee instability remains but no true buckling observed.  Pt requested chair follow but did not use chair. Will continue efforts to advance mobility and review stair training before returning home.   Follow Up Recommendations  Home health PT;Supervision for mobility/OOB     Equipment Recommendations  None recommended by PT    Recommendations for Other Services       Precautions / Restrictions Precautions Precautions: Back Precaution Comments: Reviewed back precautions during tx. Restrictions Weight Bearing Restrictions: No    Mobility  Bed Mobility               General bed mobility comments: Pt received standing with nurse tech and sat in chair post treatment, no bed mobility performed.    Transfers Overall transfer level: Needs assistance Equipment used: Rolling walker (2 wheeled)   Sit to Stand: Min guard         General transfer comment: Cues for sequencing to descend to seated surface.  Ambulation/Gait Ambulation/Gait assistance: Min assist Ambulation Distance (Feet): 220 Feet Assistive device: Rolling walker (2 wheeled) Gait Pattern/deviations: Step-through pattern;Trunk flexed;Decreased stride length Gait velocity: slow pattern Gait velocity interpretation: Below normal speed for age/gender General Gait Details: Flexed gait appears as patient fatigues.  pt more confident in his abilities but remains to require cueing for dorsiflexion, heel strike and knee extension to improve knee  extension in stance phase and reduce buckling.     Stairs            Wheelchair Mobility    Modified Rankin (Stroke Patients Only)       Balance Overall balance assessment: Needs assistance   Sitting balance-Leahy Scale: Fair       Standing balance-Leahy Scale: Fair                      Cognition Arousal/Alertness: Awake/alert Behavior During Therapy: WFL for tasks assessed/performed Overall Cognitive Status: Within Functional Limits for tasks assessed                      Exercises      General Comments        Pertinent Vitals/Pain Pain Assessment: 0-10 Pain Score: 6  Pain Location: back  Pain Descriptors / Indicators: Aching;Sharp Pain Intervention(s): Monitored during session;Repositioned    Home Living                      Prior Function            PT Goals (current goals can now be found in the care plan section) Acute Rehab PT Goals Patient Stated Goal: be able to walk and move without pain Potential to Achieve Goals: Good Progress towards PT goals: Progressing toward goals    Frequency    Min 5X/week      PT Plan Current plan remains appropriate    Co-evaluation             End of Session Equipment Utilized During Treatment: Gait belt;Back brace (LSO applied in standing.  )  Activity Tolerance: Patient tolerated treatment well;Patient limited by fatigue Patient left: in chair;with call bell/phone within reach;with family/visitor present     Time: [id number] PT Time Calculation (min) (ACUTE ONLY): 19 min  Charges:  $Gait Training: 8-22 mins                    G Codes:      Cristela Blue 2016/07/14, 1:29 PM  Governor Rooks, PTA pager [phone]

## 2016-07-02 NOTE — Progress Notes (Signed)
Subjective:  Feeling better today   Antibiotics:  Anti-infectives    Start     Dose/Rate Route Frequency Ordered Stop   06/30/16 1100  cefTRIAXone (ROCEPHIN) 2 g in dextrose 5 % 50 mL IVPB  Status:  Discontinued     2 g 100 mL/hr over 30 Minutes Intravenous Every 12 hours 06/30/16 0939 07/01/16 1007   06/29/16 2300  piperacillin-tazobactam (ZOSYN) IVPB 3.375 g  Status:  Discontinued     3.375 g 12.5 mL/hr over 240 Minutes Intravenous Every 8 hours 06/29/16 1738 06/29/16 2223   06/29/16 2300  vancomycin (VANCOCIN) IVPB 750 mg/150 ml premix     750 mg 150 mL/hr over 60 Minutes Intravenous Every 12 hours 06/29/16 1738     06/29/16 2230  cefTRIAXone (ROCEPHIN) 1 g in dextrose 5 % 50 mL IVPB  Status:  Discontinued     1 g 100 mL/hr over 30 Minutes Intravenous Every 12 hours 06/29/16 2223 06/30/16 0939   06/29/16 1300  vancomycin (VANCOCIN) IVPB 1000 mg/200 mL premix     1,000 mg 200 mL/hr over 60 Minutes Intravenous  Once 06/29/16 1248 06/29/16 1446   06/29/16 1300  piperacillin-tazobactam (ZOSYN) IVPB 3.375 g     3.375 g 12.5 mL/hr over 240 Minutes Intravenous  Once 06/29/16 1248 06/29/16 1601      Medications: Scheduled Meds: . docusate sodium  100 mg Oral Daily  . multivitamin with minerals  1 tablet Oral Daily  . sodium chloride flush  10-40 mL Intracatheter Q12H  . sodium chloride flush  3 mL Intravenous Q12H  . vancomycin  750 mg Intravenous Q12H   Continuous Infusions: . sodium chloride 20 mL (07/02/16 0807)  . sodium chloride     PRN Meds:.acetaminophen **OR** acetaminophen, alum & mag hydroxide-simeth, bisacodyl, diazepam, menthol-cetylpyridinium **OR** phenol, morphine injection, ondansetron (ZOFRAN) IV, oxyCODONE-acetaminophen, polyethylene glycol, senna-docusate, sodium chloride flush, sodium chloride flush, sodium phosphate, zolpidem, zolpidem    Objective: Weight change:   Intake/Output Summary (Last 24 hours) at 07/02/16 1643 Last data filed at  07/02/16 1107  Gross per 24 hour  Intake                0 ml  Output             1240 ml  Net            -1240 ml   Blood pressure 124/78, pulse 72, temperature 98.7 F (37.1 C), temperature source Oral, resp. rate 16, SpO2 95 %. Temp:  [98.2 F (36.8 C)-99.1 F (37.3 C)] 98.7 F (37.1 C) (10/22 1355) Pulse Rate:  [66-72] 72 (10/22 1355) Resp:  [16] 16 (10/22 1355) BP: (115-144)/(64-86) 124/78 (10/22 1355) SpO2:  [93 %-96 %] 95 % (10/22 1355)  Physical Exam: General: Alert and awake, oriented x3, not in any acute distress. HEENT: anicteric sclera,  EOMI CVS regular rate, normal r,  no murmur rubs or gallops Chest: clear to auscultation bilaterally, no wheezing, rales or rhonchi Abdomen: soft nontender, nondistended, normal bowel sounds, Extremities/skin: no rashes  Neuro: nonfocal  CBC:  CBC Latest Ref Rng & Units 07/01/2016 06/30/2016 06/29/2016  WBC 4.0 - 10.5 K/uL 7.5 8.0 7.8  Hemoglobin 13.0 - 17.0 g/dL 8.3(L) 8.9(L) 10.4(L)  Hematocrit 39.0 - 52.0 % 26.9(L) 28.6(L) 33.8(L)  Platelets 150 - 400 K/uL 332 364 406(H)      BMET  Recent Labs  06/30/16 0945  NA 138  K 3.7  CL 106  CO2 24  GLUCOSE 139*  BUN 7  CREATININE 0.82  CALCIUM 8.2*     Liver Panel  No results for input(s): PROT, ALBUMIN, AST, ALT, ALKPHOS, BILITOT, BILIDIR, IBILI in the last 72 hours.     Sedimentation Rate  Recent Labs  07/02/16 0649  ESRSEDRATE 58*   C-Reactive Protein  Recent Labs  07/02/16 0649  CRP 14.2*    Micro Results: Recent Results (from the past 720 hour(s))  Blood culture (routine x 2)     Status: None (Preliminary result)   Collection Time: 06/29/16  1:43 PM  Result Value Ref Range Status   Specimen Description BLOOD LEFT HAND  Final   Special Requests BOTTLES DRAWN AEROBIC AND ANAEROBIC 4 CC EA  Final   Culture   Final    NO GROWTH 3 DAYS Performed at [facility]    Report Status PENDING  Incomplete  Blood culture (routine x 2)      Status: None (Preliminary result)   Collection Time: 06/29/16  1:43 PM  Result Value Ref Range Status   Specimen Description BLOOD RIGHT HAND  Final   Special Requests BOTTLES DRAWN AEROBIC AND ANAEROBIC 5 CC EA  Final   Culture   Final    NO GROWTH 3 DAYS Performed at [facility]    Report Status PENDING  Incomplete  Aerobic/Anaerobic Culture (surgical/deep wound)     Status: None (Preliminary result)   Collection Time: 06/29/16  6:54 PM  Result Value Ref Range Status   Specimen Description TISSUE LUMBAR FRAGMENTS  Final   Special Requests NONE  Final   Gram Stain   Final    RARE WBC PRESENT, PREDOMINANTLY PMN NO ORGANISMS SEEN    Culture   Final    NO GROWTH 2 DAYS NO ANAEROBES ISOLATED; CULTURE IN PROGRESS FOR 5 DAYS   Report Status PENDING  Incomplete  Aerobic/Anaerobic Culture (surgical/deep wound)     Status: None (Preliminary result)   Collection Time: 06/29/16  8:05 PM  Result Value Ref Range Status   Specimen Description WOUND  Final   Special Requests LUMBAR IRRIGATION AND DEBRIDMENT  Final   Gram Stain NO WBC SEEN RARE GRAM POSITIVE COCCOBACILLUS   Final   Culture   Final    NO GROWTH 2 DAYS NO ANAEROBES ISOLATED; CULTURE IN PROGRESS FOR 5 DAYS   Report Status PENDING  Incomplete  MRSA PCR Screening     Status: None   Collection Time: 06/30/16  5:33 PM  Result Value Ref Range Status   MRSA by PCR NEGATIVE NEGATIVE Final    Comment:        The GeneXpert MRSA Assay (FDA approved for NASAL specimens only), is one component of a comprehensive MRSA colonization surveillance program. It is not intended to diagnose MRSA infection nor to guide or monitor treatment for MRSA infections.     Studies/Results: No results found.    Assessment/Plan:  INTERVAL HISTORY:  cxs ngtd on operative cx for aerobes final anerobes still incubating   Principal Problem:   Osteomyelitis of lumbar vertebra (HCC) Active Problems:   Epidural abscess   Discitis    Other acute osteomyelitis, other site (HCC)   Sequela of Corynebacterium infection    Donald [name] is a 75 y.o. male with discitis, epidural abscess POD#2  from I x D  #1 Vertebral osteomyelitis, diskitis, epidural abscess:  GS showed GP Cocco bacilli but cultures have NGTD. Note he DID get IV vancomycin and a few doses of zosyn  prior to surgery so these could have compromised the culture data and I dont know if swabs or actual pus,tissue bone were sent for cultures   I have narrowed him to IV vancomycin placing faith in the Gram Stain  I called Micro lab and NO AEROBES isolated, anaerobes will be looked for for 5 days   I will plan on him getting at least 6 if not 8 weeks of IV vancomycin (plan on 8 with option to shorten)  Diagnosis: Vertebral osteomyelitis diskitis and epidural abscess  Culture Result: NO growth but GPCB seen on GS  Allergies  Allergen Reactions  . Iodine Anaphylaxis  . Ivp Dye [Iodinated Diagnostic Agents] Anaphylaxis    Discharge antibiotics: IV vancomycin Per pharmacy protocol vancomycin Aim for Vancomycin trough 15-20 (unless otherwise indicated) Duration: 8 weeks End Date: December 13th   [place] Per Protocol:  BI-WEEKLY LABS   while on IV antibiotics:  __ BMP w GFR    weekly while on IV antibiotics:  _x_ CBC with differential\ _x_ CRP _x_ ESR _x_ Vancomycin trough  _x_ Please pull PIC at completion of IV antibiotics __ Please leave PIC in place until doctor has seen patient or been notified  Fax weekly labs to [phone]  Clinic Follow Up Appt:  Next 4-5 weeks  I will leave patient on list to for [place] to check on presence of any anerobes not covered by vancomycin but will otherwise sign off  I will arrange Hospital Followup in [place] clinic with [place].  Please call with further questions.          LOS: 3 days   Alcide Evener 07/02/2016, 4:43 PM

## 2016-07-03 LAB — HEPATITIS C ANTIBODY (REFLEX): HCV Ab: <0.1 s/co ratio (ref 0.0–0.9)

## 2016-07-03 LAB — HCV COMMENT:

## 2016-07-03 LAB — VANCOMYCIN, TROUGH: Vancomycin Tr: 10 ug/mL — ABNORMAL LOW (ref 15–20)

## 2016-07-03 MED ORDER — OXYCODONE-ACETAMINOPHEN 5-325 MG PO TABS: 1.0000 | ORAL_TABLET | ORAL | 0 refills | Status: DC | PRN

## 2016-07-03 MED ORDER — DEXTROSE 5 % IV SOLN
2.0000 g | INTRAVENOUS | Status: DC
  Administered 2016-07-03: 2 g via INTRAVENOUS
  Filled 2016-07-03: qty 2

## 2016-07-03 MED ORDER — VANCOMYCIN HCL IN DEXTROSE 1-5 GM/200ML-% IV SOLN: 1000.0000 mg | Freq: Two times a day (BID) | INTRAVENOUS | 0 refills | Status: DC

## 2016-07-03 MED ORDER — DEXTROSE 5 % IV SOLN: 2.0000 g | INTRAVENOUS | 0 refills | Status: DC

## 2016-07-03 MED ORDER — HEPARIN SOD (PORK) LOCK FLUSH 100 UNIT/ML IV SOLN
250.0000 [IU] | INTRAVENOUS | Status: AC | PRN
  Administered 2016-07-03: 250 [IU]

## 2016-07-03 MED ORDER — HYDROCODONE-ACETAMINOPHEN 5-325 MG PO TABS: 1.0000 | ORAL_TABLET | Freq: Four times a day (QID) | ORAL | 0 refills | Status: DC | PRN

## 2016-07-03 MED ORDER — VANCOMYCIN HCL IN DEXTROSE 1-5 GM/200ML-% IV SOLN
1000.0000 mg | Freq: Two times a day (BID) | INTRAVENOUS | Status: DC
  Administered 2016-07-03: 1000 mg via INTRAVENOUS
  Filled 2016-07-03 (×2): qty 200

## 2016-07-03 NOTE — Progress Notes (Signed)
Occupational Therapy Treatment and Discharge Patient Details Name: Donald [name] MRN: [id number] DOB: [birth year] Today's Date: 07/03/2016    History of present illness Pt is a 75 y.o. 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 [id number] stenosis, prostate cancer, lumbar laminectomy, Lt THA, anterior cervical fusion.   OT comments  This 75 yo male admitted and underwent above presents to acute OT with all education completed with he and wife from an acute care standpoint--we will D/C from acute OT.  Follow Up Recommendations  Home health OT;Supervision/Assistance - 24 hour    Equipment Recommendations  None recommended by OT       Precautions / Restrictions Precautions Precautions: Back Precaution Booklet Issued: No Restrictions Weight Bearing Restrictions: No              ADL Overall ADL's : Needs assistance/impaired                                       General ADL Comments: wife in room--she and pt report that she will A pt with LBADLs prn and that he does have a sock aid, reacher, and long handled shoe horn at home. I educated them on the way that should be easier for him to step into shower stall so that wife could assist him easier with LBB. He is to clear with [facility] how to keep his PICC line site dry while showering.                 Cognition   Behavior During Therapy: WFL for tasks assessed/performed Overall Cognitive Status: Within Functional Limits for tasks assessed                                    Pertinent Vitals/ Pain       Pain Assessment: No/denies pain Pain Score: 0-No pain (at rest)             Progress Toward Goals  OT Goals(current goals can now be found in the care plan section)  Progress towards OT goals: Progressing toward goals (all OT education completed from an acute care standpoint)  Acute Rehab OT  Goals Patient Stated Goal: be able to walk and move without pain  Plan Discharge plan remains appropriate       End of Session     Activity Tolerance Patient tolerated treatment well   Patient Left in chair;with call bell/phone within reach;with family/visitor present           Time: [id number] OT Time Calculation (min): 12 min  Charges: OT General Charges $OT Visit: 1 Procedure OT Treatments $Self Care/Home Management : 8-22 mins  Almon Register [id number] 07/03/2016, 3:47 PM

## 2016-07-03 NOTE — Progress Notes (Signed)
Physical Therapy Treatment Patient Details Name: Donald [name] MRN: [id number] DOB: 1940/09/21 Today's Date: 07/03/2016    History of Present Illness Pt is a 75 y.o. 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    PT Comments    Pt performed stair training in prep for d/c home.  Pt required cues for compensatory strategies to reduce pain.  Pt will d/c home today with wife.    Follow Up Recommendations  Home health PT;Supervision for mobility/OOB     Equipment Recommendations  None recommended by PT    Recommendations for Other Services       Precautions / Restrictions Precautions Precautions: Back Precaution Booklet Issued: No Restrictions Weight Bearing Restrictions: No    Mobility  Bed Mobility Overal bed mobility: Needs Assistance Bed Mobility: Rolling;Sidelying to Sit;Sit to Sidelying Rolling: Supervision Sidelying to sit: Mod assist     Sit to sidelying: Mod assist General bed mobility comments: Pt required mod assist for trunk control and assist with BLEs to transition into and out of bed.    Transfers Overall transfer level: Needs assistance Equipment used: Rolling walker (2 wheeled) Transfers: Sit to/from Stand Sit to Stand: Min assist         General transfer comment: Cues to scoot forward to edge of seated surface in prep for transfer.  Pt required cues for hand placement, forward weight shifting and assist to boost into standing.    Ambulation/Gait Ambulation/Gait assistance: Min guard Ambulation Distance (Feet): 70 Feet Assistive device: Rolling walker (2 wheeled) Gait Pattern/deviations: Step-to pattern;Trunk flexed;Shuffle;Decreased stride length Gait velocity: slow pattern   General Gait Details: Flexed gait appears as patient fatigues. Remains to require cueing  for dorsiflexion, heel strike and knee extension to improve knee extension in stance phase and reduce buckling.  Pt c/o increased pain in L hip during session.     Stairs Stairs: Yes Stairs assistance: Min guard Stair Management: One rail Left;Sideways;With walker (x2 with L rail sideways and x1 with RW forwards.  ) Number of Stairs: 3 General stair comments: Cues for sequencing and body position to negotiate x2 stairs sideways.  Pt performed additional stair to simulate threshold entry.  Pt used RW and required cues for sequencing and RW placement.  Son and wife present during session.    Wheelchair Mobility    Modified Rankin (Stroke Patients Only)       Balance Overall balance assessment: Needs assistance   Sitting balance-Leahy Scale: Fair       Standing balance-Leahy Scale: Fair                      Cognition Arousal/Alertness: Awake/alert Behavior During Therapy: WFL for tasks assessed/performed Overall Cognitive Status: Within Functional Limits for tasks assessed                      Exercises Other Exercises Other Exercises: Attempted L hamstring stretch x10 sec, pt reports he has more pain/tightness in lateral thigh.  Pt then transitioned to sidelying for L IT band stretch. 3x30 sec.  Pt reports it hepled reduce pain moderately.      General Comments        Pertinent Vitals/Pain Pain Score: 3  Pain Location: 3/10 pre tx in L hip.   Pain Descriptors / Indicators: Aching;Radiating;Tightness Pain Intervention(s): Limited activity within patient's tolerance;Repositioned;Utilized relaxation  techniques    Home Living                      Prior Function            PT Goals (current goals can now be found in the care plan section) Acute Rehab PT Goals Patient Stated Goal: be able to walk and move without pain Potential to Achieve Goals: Good Progress towards PT goals: Progressing toward goals    Frequency    Min 5X/week       PT Plan Current plan remains appropriate    Co-evaluation             End of Session Equipment Utilized During Treatment: Gait belt;Back brace (tighten LSO in standing.) Activity Tolerance: Patient tolerated treatment well;Patient limited by fatigue Patient left: in chair;with call bell/phone within reach;with family/visitor present     Time: [id number] PT Time Calculation (min) (ACUTE ONLY): 66 min  Charges:  $Gait Training: 23-37 mins $Therapeutic Exercise: 8-22 mins $Therapeutic Activity: 8-22 mins                    G Codes:      Cristela Blue July 12, 2016, 1:43 PM  Governor Rooks, PTA pager [phone]

## 2016-07-03 NOTE — Discharge Summary (Signed)
Physician Discharge Summary  Donald [name] [id number] DOB: 08-24-1941 DOA: 06/29/2016  PCP: [name], MD  Admit date: 06/29/2016 Discharge date: 07/03/2016  Admitted From: home Disposition:  home  Recommendations for Outpatient Follow-up:  1. Follow up with [name] in 1 week 2. PICC Line on d/c 3. Vancomycin and Ceftriaxone on d/c for 8 total weeks, end date 08/23/2016 per ID  Home Health: yes Equipment/Devices: none  Discharge Condition: stable CODE STATUS: Full Diet recommendation: regular  HPI: Per Dr. Wynelle Cleveland, Donald [name] is a 75 y.o. male with medical history significant of prostate CA s/p prostatectomy, spinal stenosis s/p  L3-4 and L4-5 decompression on July 20th by [name]. He has had pain since the procedure but over the past week it has been severe. He describes shooting pains in right leg involving buttock, back of leg, thigh, shin, calm and same areas on right leg except calf. He feels both legs are weak. No numbness or tingling. Maybe has had a mild fever. No chills, sweats. Having increasing difficulty with walking now.   Hospital Course: Discharge Diagnoses:  Principal Problem:   Osteomyelitis of lumbar vertebra [facility]) Active Problems:   Epidural abscess   Discitis   Other acute osteomyelitis, other site (HCC)   Sequela of Corynebacterium infection   Osteomyelitis of lumbar vertebra/epidural abscess/discitis - h/o spinal stenosis, s/p decompression of L3-L4 and L4-L5 on 03/28/16 by [name]. Patient with increasing pain and weakness, had an MRI 06/29/16 shows severe discitis-osteomyelitis at L4-5 with extensive paravertebral phlegmon. Severe posterior paraspinal soft tissue enhancement in the surgical bed of prior L4-5 laminectomy with a enlarging 13 x 15 mm right posterolateral fluid collection consistent with an abscess. On 06/29/16 underwent lumbar wound irrigation and debridement with laminectecomy for decompression at L4-5  by [name]. Preliminary wound culture with rate gram positive coccobacillus, not yet finalized. ID was consulted and have evaluated patient while hospitalized, recommended Vancomycin and Ceftiraxone for total of 8 weeks, end date 08/23/2016. Patient had PICC line and HH was arranged. Patient's back pain improved however still persisting, Dr. Jonni Sanger evaluated patient of the day of discharge and will have close outpatient follow up in 1 week. If pain persists may need repeat MRI. Anemia - of chronic disease, stable  Discharge Instructions     Medication List    TAKE these medications   cefTRIAXone 2 g in dextrose 5 % 50 mL Inject 2 g into the vein daily. End date 08/23/2016 Notes to patient:  Last dose received 07/03/18 at 1329   docusate sodium 100 MG capsule Commonly known as:  COLACE Take 100 mg by mouth daily.   HYDROcodone-acetaminophen 5-325 MG tablet Commonly known as:  NORCO Take 1-2 tablets by mouth every 6 (six) hours as needed for moderate pain. Notes to patient:  Last pain med given was at 1622 (4:22pm)   methocarbamol 500 MG tablet Commonly known as:  ROBAXIN Take 500 mg by mouth 3 (three) times daily.   multivitamin capsule Take 1 capsule by mouth daily.   vancomycin 1-5 GM/200ML-% Soln Commonly known as:  VANCOCIN Inject 200 mLs (1,000 mg total) into the vein every 12 (twelve) hours. Up until 08/23/2016 Notes to patient:  Last dose received at [place] 07/03/16   zolpidem 10 MG tablet Commonly known as:  AMBIEN TAKE 1/2 TABLET AT BEDTIME AS NEEDED FOR SLEEP.      Follow-up Information    Sinclair Ship, MD. Schedule an appointment as soon as possible for a visit  in 1 week(s).   Specialty:  Orthopedic Surgery Contact information: [address] [place] [address] [phone]        [name], MD. Schedule an appointment as soon as possible for a visit in 2 week(s).   Specialties:  Family Medicine, Sports  Medicine Contact information: [place] [id number] [phone]        [place] .   Why:  Someone from [facility] will contact you to arrange start date and time for therapy. Contact information: [address] [place] [address] [phone]        [place] .   Contact information: [place] [address] [phone]          Allergies  Allergen Reactions  . Iodine Anaphylaxis  . Ivp Dye [Iodinated Diagnostic Agents] Anaphylaxis    Consultations:  ID  Orthopedic surgery  Procedures/Studies: PREOPERATIVE DIAGNOSIS:  Likely L4-5 diskitis with epidural abscess.  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  SURGERY:  Lumbar wound irrigation and debridement with additional laminectomy for decompression at the L4-5 level with irrigation and debridement of the L4-5 intervertebral disk.  Mr Lumbar Spine W Wo Contrast  Result Date: 06/29/2016 CLINICAL DATA:  Low back pain, bilateral leg pain, back surgery 03/30/2016 EXAM: MRI LUMBAR SPINE WITHOUT AND WITH CONTRAST TECHNIQUE: Multiplanar and multiecho pulse sequences of the lumbar spine were obtained without and with intravenous contrast. CONTRAST:  87mL MULTIHANCE GADOBENATE DIMEGLUMINE 529 MG/ML IV SOLN COMPARISON:  05/29/2016 FINDINGS: Segmentation:  Standard. Alignment:  Physiologic. Vertebrae: Fluid signal in the L4-5 disc which was previously demonstrated to be desiccated. Severe marrow edema in the L4 and L5 vertebral bodies with endplate irregularity and avid enhancement on post-contrast imaging which has worsened compared with the prior exam of 05/29/2016. Severe paraspinal soft tissue enhancement and enhancement of the medial aspects of the psoas muscles bilaterally consistent with a phlegmon. No drainable paraspinal fluid collection. Severe epidural enhancement at L4 and L5 anteriorly which  has increased compared with the prior examination most consistent with an epidural phlegmon in without a drainable abscess measuring approximately 10 mm in thickness. There is severe posterior paraspinal soft tissue enhancement at L4-5 with an enlarging 13 x 15 mm fluid collection along the right posterolateral aspect of the thecal sac most consistent with an abscess at the site of prior laminectomy. Vertebral body heights are maintained. Conus medullaris: Extends to the L1 level and appears normal. Paraspinal and other soft tissues: No other paraspinal abnormality. Disc levels: Disc spaces: Disc height loss at L4-5. T12-L1: No disc bulge. Mild bilateral facet arthropathy. No evidence of neural foraminal stenosis. No central canal stenosis. L1-L2: Mild broad-based disc bulge. Moderate bilateral facet arthropathy. Mild spinal stenosis. No evidence of neural foraminal stenosis. L2-L3: Mild broad-based disc bulge. Severe bilateral facet arthropathy. Moderate spinal stenosis and lateral recess stenosis. No evidence of neural foraminal stenosis. L3-L4: No significant disc protrusion. Moderate bilateral facet arthropathy. Prior L4 laminectomy defect. No evidence of neural foraminal stenosis. No central canal stenosis. L4-L5: Prior laminectomy with severe abnormal paraspinal soft tissue abnormality as described above. Broad-based disc bulge and epidural phlegmon in resulting resultant severe spinal stenosis. Bilateral mild foraminal stenosis. L5-S1: Mild broad-based disc bulge. Severe bilateral facet arthropathy. No evidence of neural foraminal stenosis. IMPRESSION: 1. Severe discitis-osteomyelitis at L4-5 with extensive paravertebral phlegmon. Severe posterior paraspinal soft tissue enhancement in the surgical bed of prior L4-5 laminectomy with  a enlarging 13 x 15 mm right posterolateral fluid collection consistent with an abscess. Anterior epidural phlegmon along the posterior aspect of the L4-5 vertebral bodies without  an epidural abscess resulting in severe spinal stenosis at L4 and L5. Electronically Signed   By: Kathreen Devoid   On: 06/29/2016 12:18      Subjective: - no chest pain, shortness of breath, no abdominal pain, nausea or vomiting.  - complains of back pain irradiating down his legs  Discharge Exam: Vitals:   07/03/16 0657 07/03/16 1227  BP: (!) 125/92 132/70  Pulse: 68 64  Resp: 16 16  Temp: 99.3 F (37.4 C) 98.8 F (37.1 C)   Vitals:   07/02/16 1355 07/02/16 2104 07/03/16 0657 07/03/16 1227  BP: 124/78 120/79 (!) 125/92 132/70  Pulse: 72 62 68 64  Resp: 16 16 16 16   Temp: 98.7 F (37.1 C) 99.3 F (37.4 C) 99.3 F (37.4 C) 98.8 F (37.1 C)  TempSrc: Oral Oral Oral Oral  SpO2: 95% 97% 95% 97%    General: Pt is alert, awake, not in acute distress Cardiovascular: RRR, S1/S2 +, no rubs, no gallops Respiratory: CTA bilaterally, no wheezing, no rhonchi Abdominal: Soft, NT, ND, bowel sounds + Extremities: no edema, no cyanosis    The results of significant diagnostics from this hospitalization (including imaging, microbiology, ancillary and laboratory) are listed below for reference.     Microbiology: Recent Results (from the past 240 hour(s))  Blood culture (routine x 2)     Status: None (Preliminary result)   Collection Time: 06/29/16  1:43 PM  Result Value Ref Range Status   Specimen Description BLOOD LEFT HAND  Final   Special Requests BOTTLES DRAWN AEROBIC AND ANAEROBIC 4 CC EA  Final   Culture   Final    NO GROWTH 4 DAYS Performed at [facility]    Report Status PENDING  Incomplete  Blood culture (routine x 2)     Status: None (Preliminary result)   Collection Time: 06/29/16  1:43 PM  Result Value Ref Range Status   Specimen Description BLOOD RIGHT HAND  Final   Special Requests BOTTLES DRAWN AEROBIC AND ANAEROBIC 5 CC EA  Final   Culture   Final    NO GROWTH 4 DAYS Performed at [facility]    Report Status PENDING  Incomplete   Aerobic/Anaerobic Culture (surgical/deep wound)     Status: None (Preliminary result)   Collection Time: 06/29/16  6:54 PM  Result Value Ref Range Status   Specimen Description TISSUE LUMBAR FRAGMENTS  Final   Special Requests NONE  Final   Gram Stain   Final    RARE WBC PRESENT, PREDOMINANTLY PMN NO ORGANISMS SEEN    Culture   Final    NO GROWTH 2 DAYS NO ANAEROBES ISOLATED; CULTURE IN PROGRESS FOR 5 DAYS   Report Status PENDING  Incomplete  Aerobic/Anaerobic Culture (surgical/deep wound)     Status: None (Preliminary result)   Collection Time: 06/29/16  8:05 PM  Result Value Ref Range Status   Specimen Description WOUND  Final   Special Requests LUMBAR IRRIGATION AND DEBRIDMENT  Final   Gram Stain NO WBC SEEN RARE GRAM POSITIVE COCCOBACILLUS   Final   Culture   Final    NO GROWTH 2 DAYS NO ANAEROBES ISOLATED; CULTURE IN PROGRESS FOR 5 DAYS   Report Status PENDING  Incomplete  MRSA PCR Screening     Status: None   Collection Time: 06/30/16  5:33  PM  Result Value Ref Range Status   MRSA by PCR NEGATIVE NEGATIVE Final    Comment:        The GeneXpert MRSA Assay (FDA approved for NASAL specimens only), is one component of a comprehensive MRSA colonization surveillance program. It is not intended to diagnose MRSA infection nor to guide or monitor treatment for MRSA infections.      Labs: BNP (last 3 results) No results for input(s): BNP in the last 8760 hours. Basic Metabolic Panel:  Recent Labs Lab 06/29/16 0945 06/30/16 0945  NA 139 138  K 3.9 3.7  CL 105 106  CO2 24 24  GLUCOSE 105* 139*  BUN 15 7  CREATININE 0.83 0.82  CALCIUM 9.1 8.2*   Liver Function Tests: No results for input(s): AST, ALT, ALKPHOS, BILITOT, PROT, ALBUMIN in the last 168 hours. No results for input(s): LIPASE, AMYLASE in the last 168 hours. No results for input(s): AMMONIA in the last 168 hours. CBC:  Recent Labs Lab 06/29/16 0945 06/30/16 0945 07/01/16 0500  WBC 7.8 8.0 7.5   NEUTROABS 5.9  --   --   HGB 10.4* 8.9* 8.3*  HCT 33.8* 28.6* 26.9*  MCV 77.9* 76.9* 77.1*  PLT 406* 364 332   Cardiac Enzymes: No results for input(s): CKTOTAL, CKMB, CKMBINDEX, TROPONINI in the last 168 hours. BNP: Invalid input(s): POCBNP CBG: No results for input(s): GLUCAP in the last 168 hours. D-Dimer No results for input(s): DDIMER in the last 72 hours. Hgb A1c No results for input(s): HGBA1C in the last 72 hours. Lipid Profile No results for input(s): CHOL, HDL, LDLCALC, TRIG, CHOLHDL, LDLDIRECT in the last 72 hours. Thyroid function studies No results for input(s): TSH, T4TOTAL, T3FREE, THYROIDAB in the last 72 hours.  Invalid input(s): FREET3 Anemia work up No results for input(s): VITAMINB12, FOLATE, FERRITIN, TIBC, IRON, RETICCTPCT in the last 72 hours. Urinalysis    Component Value Date/Time   COLORURINE YELLOW 06/29/2016 0924   APPEARANCEUR CLOUDY (A) 06/29/2016 0924   LABSPEC 1.036 (H) 06/29/2016 0924   PHURINE 6.0 06/29/2016 0924   GLUCOSEU NEGATIVE 06/29/2016 0924   HGBUR NEGATIVE 06/29/2016 0924   BILIRUBINUR NEGATIVE 06/29/2016 0924   BILIRUBINUR small (A) 06/26/2016 1335   BILIRUBINUR neg 12/02/2012 1522   KETONESUR NEGATIVE 06/29/2016 0924   PROTEINUR NEGATIVE 06/29/2016 0924   UROBILINOGEN 0.2 06/26/2016 1335   UROBILINOGEN 0.2 09/12/2013 0916   NITRITE NEGATIVE 06/29/2016 0924   LEUKOCYTESUR NEGATIVE 06/29/2016 0924   Sepsis Labs Invalid input(s): PROCALCITONIN,  WBC,  LACTICIDVEN Microbiology Recent Results (from the past 240 hour(s))  Blood culture (routine x 2)     Status: None (Preliminary result)   Collection Time: 06/29/16  1:43 PM  Result Value Ref Range Status   Specimen Description BLOOD LEFT HAND  Final   Special Requests BOTTLES DRAWN AEROBIC AND ANAEROBIC 4 CC EA  Final   Culture   Final    NO GROWTH 4 DAYS Performed at [facility]    Report Status PENDING  Incomplete  Blood culture (routine x 2)     Status: None  (Preliminary result)   Collection Time: 06/29/16  1:43 PM  Result Value Ref Range Status   Specimen Description BLOOD RIGHT HAND  Final   Special Requests BOTTLES DRAWN AEROBIC AND ANAEROBIC 5 CC EA  Final   Culture   Final    NO GROWTH 4 DAYS Performed at [facility]    Report Status PENDING  Incomplete  Aerobic/Anaerobic Culture (  surgical/deep wound)     Status: None (Preliminary result)   Collection Time: 06/29/16  6:54 PM  Result Value Ref Range Status   Specimen Description TISSUE LUMBAR FRAGMENTS  Final   Special Requests NONE  Final   Gram Stain   Final    RARE WBC PRESENT, PREDOMINANTLY PMN NO ORGANISMS SEEN    Culture   Final    NO GROWTH 2 DAYS NO ANAEROBES ISOLATED; CULTURE IN PROGRESS FOR 5 DAYS   Report Status PENDING  Incomplete  Aerobic/Anaerobic Culture (surgical/deep wound)     Status: None (Preliminary result)   Collection Time: 06/29/16  8:05 PM  Result Value Ref Range Status   Specimen Description WOUND  Final   Special Requests LUMBAR IRRIGATION AND DEBRIDMENT  Final   Gram Stain NO WBC SEEN RARE GRAM POSITIVE COCCOBACILLUS   Final   Culture   Final    NO GROWTH 2 DAYS NO ANAEROBES ISOLATED; CULTURE IN PROGRESS FOR 5 DAYS   Report Status PENDING  Incomplete  MRSA PCR Screening     Status: None   Collection Time: 06/30/16  5:33 PM  Result Value Ref Range Status   MRSA by PCR NEGATIVE NEGATIVE Final    Comment:        The GeneXpert MRSA Assay (FDA approved for NASAL specimens only), is one component of a comprehensive MRSA colonization surveillance program. It is not intended to diagnose MRSA infection nor to guide or monitor treatment for MRSA infections.      Time coordinating discharge: Over 30 minutes  SIGNED:  Marzetta Board, MD  [facility] 07/03/2016, 5:32 PM Pager [phone]  If 7PM-7AM, please contact night-coverage [email or web address] Password [email or web address]

## 2016-07-03 NOTE — Progress Notes (Addendum)
Subjective:  Afebrile. Anxious to go home with iv abtx   Antibiotics:  Anti-infectives    Start     Dose/Rate Route Frequency Ordered Stop   07/03/16 1215  cefTRIAXone (ROCEPHIN) 2 g in dextrose 5 % 50 mL IVPB     2 g 100 mL/hr over 30 Minutes Intravenous Every 24 hours 07/03/16 1209     07/03/16 1000  vancomycin (VANCOCIN) IVPB 1000 mg/200 mL premix     1,000 mg 200 mL/hr over 60 Minutes Intravenous Every 12 hours 07/03/16 0847     07/03/16 0000  vancomycin (VANCOCIN) 1-5 GM/200ML-% SOLN     1,000 mg 200 mL/hr over 60 Minutes Intravenous Every 12 hours 07/03/16 0923     06/30/16 1100  cefTRIAXone (ROCEPHIN) 2 g in dextrose 5 % 50 mL IVPB  Status:  Discontinued     2 g 100 mL/hr over 30 Minutes Intravenous Every 12 hours 06/30/16 0939 07/01/16 1007   06/29/16 2300  piperacillin-tazobactam (ZOSYN) IVPB 3.375 g  Status:  Discontinued     3.375 g 12.5 mL/hr over 240 Minutes Intravenous Every 8 hours 06/29/16 1738 06/29/16 2223   06/29/16 2300  vancomycin (VANCOCIN) IVPB 750 mg/150 ml premix  Status:  Discontinued     750 mg 150 mL/hr over 60 Minutes Intravenous Every 12 hours 06/29/16 1738 07/03/16 0847   06/29/16 2230  cefTRIAXone (ROCEPHIN) 1 g in dextrose 5 % 50 mL IVPB  Status:  Discontinued     1 g 100 mL/hr over 30 Minutes Intravenous Every 12 hours 06/29/16 2223 06/30/16 0939   06/29/16 1300  vancomycin (VANCOCIN) IVPB 1000 mg/200 mL premix     1,000 mg 200 mL/hr over 60 Minutes Intravenous  Once 06/29/16 1248 06/29/16 1446   06/29/16 1300  piperacillin-tazobactam (ZOSYN) IVPB 3.375 g     3.375 g 12.5 mL/hr over 240 Minutes Intravenous  Once 06/29/16 1248 06/29/16 1601      Medications: Scheduled Meds: . cefTRIAXone (ROCEPHIN)  IV  2 g Intravenous Q24H  . docusate sodium  100 mg Oral Daily  . multivitamin with minerals  1 tablet Oral Daily  . sodium chloride flush  10-40 mL Intracatheter Q12H  . sodium chloride flush  3 mL Intravenous Q12H  . vancomycin   1,000 mg Intravenous Q12H   Continuous Infusions: . sodium chloride 20 mL (07/02/16 0807)  . sodium chloride       Objective: Weight change:   Intake/Output Summary (Last 24 hours) at 07/03/16 1210 Last data filed at 07/03/16 1100  Gross per 24 hour  Intake              440 ml  Output              450 ml  Net              -10 ml   Blood pressure (!) 125/92, pulse 68, temperature 99.3 F (37.4 C), temperature source Oral, resp. rate 16, SpO2 95 %. Temp:  [98.7 F (37.1 C)-99.3 F (37.4 C)] 99.3 F (37.4 C) (10/23 0657) Pulse Rate:  [62-72] 68 (10/23 0657) Resp:  [16] 16 (10/23 0657) BP: (120-125)/(78-92) 125/92 (10/23 0657) SpO2:  [95 %-97 %] 95 % (10/23 0657)  Physical Exam: General: Alert and awake, oriented x3, not in any acute distress. HEENT: anicteric sclera,  EOMI CVS regular rate, normal r,  no murmur rubs or gallops Chest: clear to auscultation bilaterally, no wheezing, rales or rhonchi Abdomen:  soft nontender, nondistended, normal bowel sounds, Extremities/skin: no rashes  Neuro: nonfocal  CBC:  CBC Latest Ref Rng & Units 07/01/2016 06/30/2016 06/29/2016  WBC 4.0 - 10.5 K/uL 7.5 8.0 7.8  Hemoglobin 13.0 - 17.0 g/dL 8.3(L) 8.9(L) 10.4(L)  Hematocrit 39.0 - 52.0 % 26.9(L) 28.6(L) 33.8(L)  Platelets 150 - 400 K/uL 332 364 406(H)      Sedimentation Rate  Recent Labs  07/02/16 0649  ESRSEDRATE 58*   C-Reactive Protein  Recent Labs  07/02/16 0649  CRP 14.2*    Micro Results: Recent Results (from the past 720 hour(s))  Blood culture (routine x 2)     Status: None (Preliminary result)   Collection Time: 06/29/16  1:43 PM  Result Value Ref Range Status   Specimen Description BLOOD LEFT HAND  Final   Special Requests BOTTLES DRAWN AEROBIC AND ANAEROBIC 4 CC EA  Final   Culture   Final    NO GROWTH 3 DAYS Performed at [facility]    Report Status PENDING  Incomplete  Blood culture (routine x 2)     Status: None (Preliminary result)    Collection Time: 06/29/16  1:43 PM  Result Value Ref Range Status   Specimen Description BLOOD RIGHT HAND  Final   Special Requests BOTTLES DRAWN AEROBIC AND ANAEROBIC 5 CC EA  Final   Culture   Final    NO GROWTH 3 DAYS Performed at [facility]    Report Status PENDING  Incomplete  Aerobic/Anaerobic Culture (surgical/deep wound)     Status: None (Preliminary result)   Collection Time: 06/29/16  6:54 PM  Result Value Ref Range Status   Specimen Description TISSUE LUMBAR FRAGMENTS  Final   Special Requests NONE  Final   Gram Stain   Final    RARE WBC PRESENT, PREDOMINANTLY PMN NO ORGANISMS SEEN    Culture   Final    NO GROWTH 2 DAYS NO ANAEROBES ISOLATED; CULTURE IN PROGRESS FOR 5 DAYS   Report Status PENDING  Incomplete  Aerobic/Anaerobic Culture (surgical/deep wound)     Status: None (Preliminary result)   Collection Time: 06/29/16  8:05 PM  Result Value Ref Range Status   Specimen Description WOUND  Final   Special Requests LUMBAR IRRIGATION AND DEBRIDMENT  Final   Gram Stain NO WBC SEEN RARE GRAM POSITIVE COCCOBACILLUS   Final   Culture   Final    NO GROWTH 2 DAYS NO ANAEROBES ISOLATED; CULTURE IN PROGRESS FOR 5 DAYS   Report Status PENDING  Incomplete  MRSA PCR Screening     Status: None   Collection Time: 06/30/16  5:33 PM  Result Value Ref Range Status   MRSA by PCR NEGATIVE NEGATIVE Final    Comment:        The GeneXpert MRSA Assay (FDA approved for NASAL specimens only), is one component of a comprehensive MRSA colonization surveillance program. It is not intended to diagnose MRSA infection nor to guide or monitor treatment for MRSA infections.     Studies/Results: No results found.    Assessment/Plan:  INTERVAL HISTORY:  cxs ngtd on operative cx for aerobes final anerobes still incubating   Principal Problem:   Osteomyelitis of lumbar vertebra (HCC) Active Problems:   Epidural abscess   Discitis   Other acute osteomyelitis, other site  (HCC)   Sequela of Corynebacterium infection    Donald [name] is a 75 y.o. male with discitis, epidural abscess POD#2  from I x D  #1 Vertebral  osteomyelitis, diskitis, epidural abscess:  Since OR cultures are no growth to date, we will treat broadly with vancomycin plus ceftriaxone 2gm iv daily  The patient and his family are anxious about going home with picc line. Please have advance home health see patient prior to discharge for anticipatory guidelines and teaching  Spent 25 min with patient with greater than 50% in coordination of care. I have discussed plan with Dr Lynann Bologna and Dr Cruzita Lederer and home health agency  Diagnosis: Vertebral osteomyelitis diskitis and epidural abscess  Culture Result: NO growth but GPCB seen on GS  Allergies  Allergen Reactions  . Iodine Anaphylaxis  . Ivp Dye [Iodinated Diagnostic Agents] Anaphylaxis    Discharge antibiotics: IV vancomycin and ceftriaxone 2gm iv daily Per pharmacy protocol vancomycin Aim for Vancomycin trough 15-20 (unless otherwise indicated) Duration: 8 weeks End Date: December 13th   [place] Per Protocol:  BI-WEEKLY LABS   while on IV antibiotics:  _x_ BMP w GFR    weekly while on IV antibiotics:  _x_ CBC with differential\ _x_ CRP _x_ ESR _x_ Vancomycin trough  _x_ Please pull PIC at completion of IV antibiotics __ Please leave PIC in place until doctor has seen patient or been notified  Fax weekly labs to [phone]  Clinic Follow Up Appt:  Next 4-5 weeks  I will leave patient on list to for [place] to check on presence of any anerobes not covered by vancomycin but will otherwise sign off  I will arrange Hospital Followup in [place] clinic with [place].  Please call with further questions.          LOS: 4 days   Angelette Ganus 07/03/2016, 12:10 PM

## 2016-07-03 NOTE — Progress Notes (Signed)
[place] pt for [facility] this admission for [facility], PT and [facility] team to support home IV ABX at home for pt. [facility] hospital team will follow pt while inpatient to support transition home when ordered to ensure [facility] care needs are met. Thank You.   If patient discharges after hours, please call [phone].   Larry Sierras 07/03/2016, 10:20 AM

## 2016-07-03 NOTE — Care Management Note (Signed)
Case Management Note  Patient Details  Name: Donald [name] MRN: [id number] Date of Birth: 22-Jan-1941  Subjective/Objective:    75 yr old gentleman s/p I & D and wound exploration of L4-5 vertebral osteo and diskitis.                Action/Plan: Case manager spoke with patient and family concerning Home Health needs. Choice was offered for [place], referral was called to Santiago Glad, [facility], and to Carolynn Sayers, [facility] IV Specialist. Patient will be going home with PICC line for IV antibiotic infusion until 08/13/16. Patient has rolling walker and elevated Toilet seat. CM will order hospital bed, patient is unable to lie flat.    Expected Discharge Date:   07/03/16               Expected Discharge Plan:  [place]  In-House Referral:     Discharge planning Services  CM Consult  Post Acute Care Choice:  Home Health Choice offered to:  Patient, Spouse  DME Arranged:  IV pump/equipment DME Agency:  [place] Arranged:  RN, PT, IV Antibiotics HH Agency:  [place]  Status of Service:  Completed, signed off  If discussed at [place] of Stay Meetings, dates discussed:    Additional Comments:  Ninfa Meeker, RN 07/03/2016, 11:06 AM

## 2016-07-03 NOTE — Progress Notes (Signed)
Patient had resolution of his bilateral leg pain, but did begin noticing increased pain in hi left hip extending into the left leg this AM. This pain is not as severe as it was previously. Continues to deny right leg pain.  BP (!) 125/92 (BP Location: Left Arm)   Pulse 68   Temp 99.3 F (37.4 C) (Oral)   Resp 16   SpO2 95%   S/p lumbar wound I and D. Discussed with [name]. Plan is for 6-8 weeks of IV vancomycin, and home health PT, and I'd like to see patient back in 1 week. If left leg pain persists, an updated lumbar MRI may be needed. As he just had his decompression and I and D, I do feel that we're currently doing all we can, and that an updated MRI at this moment would likely not be helpful. I do look forward to seeing him in 1 week for a repeat evaluation. Rx for Norco placed in patient's chart.

## 2016-07-03 NOTE — Progress Notes (Signed)
Pharmacy Antibiotic Note  Donald [name] is a 75 y.o. male admitted on 06/29/2016 with vertebral osteomyletis, diskitis, epidural abscess.  Pharmacy has been consulted for vancomycin dosing.  WBC WNL, afebrile. Vancomycin trough = 10 on vancomycin 750 mg IV q12h - below goal of 15-30 mcg/ml.   Plan: increase Vancomycin 1000 IV every 12 hours.  Goal trough 15-20 mcg/mL.  abx for 8 weeks: stop date 08/23/16 Outpatient Labs per ID note   Temp (24hrs), Avg:99.1 F (37.3 C), Min:98.7 F (37.1 C), Max:99.3 F (37.4 C)   Recent Labs Lab 06/26/16 1333 06/29/16 0945 06/30/16 0945 07/01/16 0500 07/03/16 0715  WBC 9.2 7.8 8.0 7.5  --   CREATININE  --  0.83 0.82  --   --   VANCOTROUGH  --   --   --   --  10*    Estimated Creatinine Clearance: 98.5 mL/min (by C-G formula based on SCr of 0.82 mg/dL).    Allergies  Allergen Reactions  . Iodine Anaphylaxis  . Ivp Dye [Iodinated Diagnostic Agents] Anaphylaxis    Antimicrobials this admission: 10/19 Vancomycin >>  10/19 Zosyn >> 10/20 10/20 Ceftriaxone>>10/21  Levels/dose changes this admission: 10/23 VT = 12 on 750 q12, increase to 1 gm q12  Microbiology results: 10/19 BCx: ngtd 10/19 lumbar wound:  rare Gm(+) coccobacillus 10/19 tissue fragments:  ntd 10/20 MRSA PCR neg 10/22 HIV neg  Eudelia Bunch, Pharm.D. BP:[id number] 07/03/2016 8:54 AM

## 2016-07-04 LAB — CULTURE, BLOOD (ROUTINE X 2)
CULTURE: NO GROWTH
Culture: NO GROWTH

## 2016-07-05 LAB — AEROBIC/ANAEROBIC CULTURE (SURGICAL/DEEP WOUND): GRAM STAIN: NONE SEEN

## 2016-07-05 LAB — AEROBIC/ANAEROBIC CULTURE W GRAM STAIN (SURGICAL/DEEP WOUND)

## 2016-07-06 ENCOUNTER — Encounter: Payer: Self-pay | Admitting: Internal Medicine

## 2016-07-07 ENCOUNTER — Other Ambulatory Visit: Payer: Medicare Other

## 2016-07-08 LAB — AEROBIC/ANAEROBIC CULTURE W GRAM STAIN (SURGICAL/DEEP WOUND)

## 2016-07-10 ENCOUNTER — Telehealth: Payer: Self-pay | Admitting: Family Medicine

## 2016-07-10 ENCOUNTER — Encounter: Payer: Self-pay | Admitting: Family Medicine

## 2016-07-10 ENCOUNTER — Encounter: Payer: Self-pay | Admitting: Internal Medicine

## 2016-07-10 NOTE — Telephone Encounter (Signed)
Called to check status, as have been following course from [facility].  Pain is stable, but persistent. Still requiring medication fairly consistently. Saw [name] this morning. Still on IV abx's with HH PT and OT and labs 2x/week. Wound culture noted, few Propionibacterium acnes.   FEW PROPIONIBACTERIUM ACNES    Report Status 07/05/2016 FINAL   Discussed with [name], no change in regimen needed. Has follow-up planned with infectious disease on the 30th. Home health labs are being monitored by infectious disease. Message sent to patient on my chart regarding these results and advised to let me know if he has any questions or concerns during this time. Advised again if he did have questions, as his primary care provider, I can certainly discuss those with either orthopedist or infectious disease if I do not know the answer.  Understanding expressed.

## 2016-07-17 ENCOUNTER — Other Ambulatory Visit: Payer: Self-pay | Admitting: Family Medicine

## 2016-07-17 ENCOUNTER — Telehealth: Payer: Self-pay

## 2016-07-17 ENCOUNTER — Encounter: Payer: Self-pay | Admitting: Infectious Disease

## 2016-07-17 MED ORDER — TERBINAFINE HCL 250 MG PO TABS: 250.0000 mg | ORAL_TABLET | Freq: Every day | ORAL | 0 refills | Status: DC

## 2016-07-17 MED ORDER — CLOTRIMAZOLE 1 % EX CREA: 1.0000 "application " | TOPICAL_CREAM | Freq: Two times a day (BID) | CUTANEOUS | 0 refills | Status: DC

## 2016-07-17 NOTE — Telephone Encounter (Signed)
Based on description, likely candidal/fungal. start clotrimazole topical to affected areas twice a day and Lamisil once per day.  If redness not improving within 48 hours, recommend office visit to determine if other treatment needed. If continued spread of erythema, return here or ER sooner.

## 2016-07-17 NOTE — Telephone Encounter (Signed)
Dr Greene, Please advise 

## 2016-07-17 NOTE — Telephone Encounter (Signed)
THIS MESSAGE IS FOR DR. Carlota Raspberry: Donald [name] ([place]) [place] DR. GREENE TO KNOW THAT [name] HAS A YEASTY, RED AND RAW RASH ON HIS INNER THIGHS AND UPPER BUTTOCKS. HE IS ON 2 IV ANTIBIOTICS WHICH ARE ROCEPHIN AND VANCOMYCIN. SHE SAID DR. GREENE WILL PROBABLY ORDER HIM TO HAVE A DIFLUCAN AND SOME TYPE OF CREAM. BEST PHONE [phone] ([place]) [place].  [place]

## 2016-07-18 ENCOUNTER — Telehealth: Payer: Self-pay | Admitting: Emergency Medicine

## 2016-07-18 NOTE — Telephone Encounter (Signed)
Pt started first dose of Lamisil this morning. He has not used the prescribed cream as of yet. He has scheduled f/u appt with Dr. Carlota Raspberry on Thursday Advised to return to the clinic or go to Er if sx's worsen

## 2016-07-20 ENCOUNTER — Encounter (HOSPITAL_COMMUNITY): Payer: Self-pay

## 2016-07-20 ENCOUNTER — Ambulatory Visit (INDEPENDENT_AMBULATORY_CARE_PROVIDER_SITE_OTHER): Payer: Medicare Other | Admitting: Family Medicine

## 2016-07-20 ENCOUNTER — Inpatient Hospital Stay (HOSPITAL_COMMUNITY)
Admission: EM | Admit: 2016-07-20 | Discharge: 2016-07-31 | DRG: 456 | Payer: Medicare Other | Attending: Family Medicine | Admitting: Family Medicine

## 2016-07-20 ENCOUNTER — Emergency Department (HOSPITAL_COMMUNITY): Payer: Medicare Other

## 2016-07-20 ENCOUNTER — Encounter: Payer: Self-pay | Admitting: Infectious Disease

## 2016-07-20 ENCOUNTER — Encounter: Payer: Self-pay | Admitting: Family Medicine

## 2016-07-20 VITALS — BP 110/80 | HR 87 | Temp 98.0°F | Resp 20 | Ht 72.0 in | Wt 195.0 lb

## 2016-07-20 DIAGNOSIS — Z87891 Personal history of nicotine dependence: Secondary | ICD-10-CM

## 2016-07-20 DIAGNOSIS — A498 Other bacterial infections of unspecified site: Secondary | ICD-10-CM | POA: Diagnosis present

## 2016-07-20 DIAGNOSIS — Z8546 Personal history of malignant neoplasm of prostate: Secondary | ICD-10-CM | POA: Diagnosis not present

## 2016-07-20 DIAGNOSIS — B372 Candidiasis of skin and nail: Secondary | ICD-10-CM | POA: Diagnosis not present

## 2016-07-20 DIAGNOSIS — M545 Low back pain, unspecified: Secondary | ICD-10-CM

## 2016-07-20 DIAGNOSIS — G8220 Paraplegia, unspecified: Secondary | ICD-10-CM | POA: Diagnosis present

## 2016-07-20 DIAGNOSIS — Z9079 Acquired absence of other genital organ(s): Secondary | ICD-10-CM | POA: Diagnosis not present

## 2016-07-20 DIAGNOSIS — D62 Acute posthemorrhagic anemia: Secondary | ICD-10-CM

## 2016-07-20 DIAGNOSIS — M4640 Discitis, unspecified, site unspecified: Secondary | ICD-10-CM | POA: Diagnosis not present

## 2016-07-20 DIAGNOSIS — M479 Spondylosis, unspecified: Secondary | ICD-10-CM | POA: Diagnosis present

## 2016-07-20 DIAGNOSIS — Z79899 Other long term (current) drug therapy: Secondary | ICD-10-CM | POA: Diagnosis not present

## 2016-07-20 DIAGNOSIS — Z9889 Other specified postprocedural states: Secondary | ICD-10-CM | POA: Diagnosis not present

## 2016-07-20 DIAGNOSIS — K5903 Drug induced constipation: Secondary | ICD-10-CM

## 2016-07-20 DIAGNOSIS — Z91041 Radiographic dye allergy status: Secondary | ICD-10-CM

## 2016-07-20 DIAGNOSIS — B379 Candidiasis, unspecified: Secondary | ICD-10-CM

## 2016-07-20 DIAGNOSIS — M4626 Osteomyelitis of vertebra, lumbar region: Principal | ICD-10-CM | POA: Diagnosis present

## 2016-07-20 DIAGNOSIS — M48061 Spinal stenosis, lumbar region without neurogenic claudication: Secondary | ICD-10-CM | POA: Diagnosis present

## 2016-07-20 DIAGNOSIS — R269 Unspecified abnormalities of gait and mobility: Secondary | ICD-10-CM

## 2016-07-20 DIAGNOSIS — M869 Osteomyelitis, unspecified: Secondary | ICD-10-CM

## 2016-07-20 DIAGNOSIS — D509 Iron deficiency anemia, unspecified: Secondary | ICD-10-CM | POA: Diagnosis present

## 2016-07-20 DIAGNOSIS — T814XXA Infection following a procedure, initial encounter: Secondary | ICD-10-CM | POA: Diagnosis not present

## 2016-07-20 DIAGNOSIS — G062 Extradural and subdural abscess, unspecified: Secondary | ICD-10-CM | POA: Diagnosis present

## 2016-07-20 DIAGNOSIS — R634 Abnormal weight loss: Secondary | ICD-10-CM | POA: Diagnosis present

## 2016-07-20 DIAGNOSIS — T402X5A Adverse effect of other opioids, initial encounter: Secondary | ICD-10-CM | POA: Diagnosis not present

## 2016-07-20 DIAGNOSIS — M79605 Pain in left leg: Secondary | ICD-10-CM | POA: Diagnosis present

## 2016-07-20 DIAGNOSIS — Z91018 Allergy to other foods: Secondary | ICD-10-CM

## 2016-07-20 DIAGNOSIS — E785 Hyperlipidemia, unspecified: Secondary | ICD-10-CM | POA: Diagnosis present

## 2016-07-20 DIAGNOSIS — Z419 Encounter for procedure for purposes other than remedying health state, unspecified: Secondary | ICD-10-CM

## 2016-07-20 DIAGNOSIS — G894 Chronic pain syndrome: Secondary | ICD-10-CM

## 2016-07-20 DIAGNOSIS — Z981 Arthrodesis status: Secondary | ICD-10-CM

## 2016-07-20 DIAGNOSIS — M4646 Discitis, unspecified, lumbar region: Secondary | ICD-10-CM | POA: Diagnosis present

## 2016-07-20 DIAGNOSIS — N400 Enlarged prostate without lower urinary tract symptoms: Secondary | ICD-10-CM | POA: Diagnosis present

## 2016-07-20 DIAGNOSIS — M5442 Lumbago with sciatica, left side: Secondary | ICD-10-CM

## 2016-07-20 DIAGNOSIS — B9689 Other specified bacterial agents as the cause of diseases classified elsewhere: Secondary | ICD-10-CM | POA: Diagnosis not present

## 2016-07-20 DIAGNOSIS — G061 Intraspinal abscess and granuloma: Secondary | ICD-10-CM | POA: Diagnosis present

## 2016-07-20 DIAGNOSIS — M5416 Radiculopathy, lumbar region: Secondary | ICD-10-CM | POA: Diagnosis not present

## 2016-07-20 DIAGNOSIS — B999 Unspecified infectious disease: Secondary | ICD-10-CM

## 2016-07-20 LAB — COMPREHENSIVE METABOLIC PANEL
ALBUMIN: 2.6 g/dL — AB (ref 3.5–5.0)
ALT: 15 U/L — ABNORMAL LOW (ref 17–63)
AST: 18 U/L (ref 15–41)
Alkaline Phosphatase: 71 U/L (ref 38–126)
Anion gap: 11 (ref 5–15)
BUN: 11 mg/dL (ref 6–20)
CHLORIDE: 107 mmol/L (ref 101–111)
CO2: 22 mmol/L (ref 22–32)
Calcium: 8.5 mg/dL — ABNORMAL LOW (ref 8.9–10.3)
Creatinine, Ser: 0.75 mg/dL (ref 0.61–1.24)
GFR calc Af Amer: >60 mL/min (ref >60)
GLUCOSE: 93 mg/dL (ref 65–99)
POTASSIUM: 3.9 mmol/L (ref 3.5–5.1)
SODIUM: 140 mmol/L (ref 135–145)
Total Bilirubin: 0.5 mg/dL (ref 0.3–1.2)
Total Protein: 5.6 g/dL — ABNORMAL LOW (ref 6.5–8.1)

## 2016-07-20 LAB — CBC WITH DIFFERENTIAL/PLATELET
BASOS ABS: 0 10*3/uL (ref 0.0–0.1)
Basophils Relative: 0 %
EOS ABS: 0.5 10*3/uL (ref 0.0–0.7)
EOS PCT: 8 %
HCT: 27.1 % — ABNORMAL LOW (ref 39.0–52.0)
Hemoglobin: 8.2 g/dL — ABNORMAL LOW (ref 13.0–17.0)
Lymphocytes Relative: 16 %
Lymphs Abs: 1 10*3/uL (ref 0.7–4.0)
MCH: 23.2 pg — ABNORMAL LOW (ref 26.0–34.0)
MCHC: 30.3 g/dL (ref 30.0–36.0)
MCV: 76.8 fL — ABNORMAL LOW (ref 78.0–100.0)
Monocytes Absolute: 0.4 10*3/uL (ref 0.1–1.0)
Monocytes Relative: 7 %
Neutro Abs: 4.6 10*3/uL (ref 1.7–7.7)
Neutrophils Relative %: 69 %
PLATELETS: 285 10*3/uL (ref 150–400)
RBC: 3.53 MIL/uL — AB (ref 4.22–5.81)
RDW: 16.5 % — ABNORMAL HIGH (ref 11.5–15.5)
WBC: 6.5 10*3/uL (ref 4.0–10.5)

## 2016-07-20 LAB — I-STAT CG4 LACTIC ACID, ED
LACTIC ACID, VENOUS: 0.56 mmol/L (ref 0.5–1.9)
LACTIC ACID, VENOUS: 0.77 mmol/L (ref 0.5–1.9)

## 2016-07-20 MED ORDER — OXYCODONE-ACETAMINOPHEN 5-325 MG PO TABS
1.0000 | ORAL_TABLET | Freq: Four times a day (QID) | ORAL | Status: DC | PRN
  Administered 2016-07-20 – 2016-07-22 (×5): 2 via ORAL
  Administered 2016-07-22: 1 via ORAL
  Administered 2016-07-23 – 2016-07-26 (×8): 2 via ORAL
  Administered 2016-07-26 (×2): 1 via ORAL
  Administered 2016-07-28 – 2016-07-31 (×2): 2 via ORAL
  Filled 2016-07-20 (×2): qty 2
  Filled 2016-07-20 (×2): qty 1
  Filled 2016-07-20 (×3): qty 2
  Filled 2016-07-20: qty 1
  Filled 2016-07-20 (×11): qty 2

## 2016-07-20 MED ORDER — CEFTRIAXONE SODIUM 2 G IJ SOLR
2.0000 g | INTRAMUSCULAR | Status: DC
  Administered 2016-07-21: 2 g via INTRAVENOUS
  Filled 2016-07-20: qty 2

## 2016-07-20 MED ORDER — LORAZEPAM 2 MG/ML IJ SOLN
1.0000 mg | INTRAMUSCULAR | Status: DC | PRN
  Administered 2016-07-20: 1 mg via INTRAVENOUS
  Filled 2016-07-20: qty 1

## 2016-07-20 MED ORDER — HYDROMORPHONE HCL 2 MG/ML IJ SOLN
1.0000 mg | Freq: Once | INTRAMUSCULAR | Status: AC
  Administered 2016-07-20: 1 mg via INTRAVENOUS
  Filled 2016-07-20: qty 1

## 2016-07-20 MED ORDER — HYDROCODONE-ACETAMINOPHEN 5-325 MG PO TABS
1.0000 | ORAL_TABLET | Freq: Four times a day (QID) | ORAL | Status: DC | PRN
  Administered 2016-07-21 – 2016-07-22 (×2): 2 via ORAL
  Administered 2016-07-23: 1 via ORAL
  Administered 2016-07-24: 2 via ORAL
  Administered 2016-07-24: 1 via ORAL
  Filled 2016-07-20: qty 1
  Filled 2016-07-20 (×5): qty 2

## 2016-07-20 MED ORDER — VANCOMYCIN HCL IN DEXTROSE 1-5 GM/200ML-% IV SOLN
1000.0000 mg | Freq: Two times a day (BID) | INTRAVENOUS | Status: DC
  Administered 2016-07-21 (×2): 1000 mg via INTRAVENOUS
  Filled 2016-07-20 (×3): qty 200

## 2016-07-20 MED ORDER — SODIUM CHLORIDE 0.9 % IV SOLN
250.0000 mL | INTRAVENOUS | Status: DC | PRN
Start: 2016-07-20 — End: 2016-07-31
  Administered 2016-07-21: 250 mL via INTRAVENOUS
  Administered 2016-07-25: 14:00:00 via INTRAVENOUS

## 2016-07-20 MED ORDER — GADOBENATE DIMEGLUMINE 529 MG/ML IV SOLN
20.0000 mL | Freq: Once | INTRAVENOUS | Status: AC | PRN
  Administered 2016-07-20: 20 mL via INTRAVENOUS

## 2016-07-20 MED ORDER — DOCUSATE SODIUM 100 MG PO CAPS
100.0000 mg | ORAL_CAPSULE | Freq: Every day | ORAL | Status: DC
  Administered 2016-07-21 – 2016-07-24 (×4): 100 mg via ORAL
  Filled 2016-07-20 (×4): qty 1

## 2016-07-20 MED ORDER — TERBINAFINE HCL 250 MG PO TABS
250.0000 mg | ORAL_TABLET | Freq: Every day | ORAL | Status: DC
  Administered 2016-07-21: 250 mg via ORAL
  Filled 2016-07-20: qty 1

## 2016-07-20 MED ORDER — SODIUM CHLORIDE 0.9% FLUSH: 3.0000 mL | INTRAVENOUS | Status: DC | PRN

## 2016-07-20 MED ORDER — SODIUM CHLORIDE 0.9% FLUSH
10.0000 mL | INTRAVENOUS | Status: DC | PRN
Start: 2016-07-20 — End: 2016-07-31
  Administered 2016-07-29 – 2016-07-31 (×2): 10 mL
  Filled 2016-07-20 (×2): qty 40

## 2016-07-20 MED ORDER — SODIUM CHLORIDE 0.9% FLUSH
3.0000 mL | Freq: Two times a day (BID) | INTRAVENOUS | Status: DC
  Administered 2016-07-21 – 2016-07-30 (×8): 3 mL via INTRAVENOUS

## 2016-07-20 MED ORDER — ADULT MULTIVITAMIN W/MINERALS CH
1.0000 | ORAL_TABLET | Freq: Every day | ORAL | Status: DC
  Administered 2016-07-21 – 2016-07-31 (×11): 1 via ORAL
  Filled 2016-07-20 (×12): qty 1

## 2016-07-20 MED ORDER — ZOLPIDEM TARTRATE 5 MG PO TABS
5.0000 mg | ORAL_TABLET | Freq: Every evening | ORAL | Status: DC | PRN
  Administered 2016-07-21 – 2016-07-23 (×3): 5 mg via ORAL
  Filled 2016-07-20 (×3): qty 1

## 2016-07-20 NOTE — Consult Note (Signed)
Reason for Consult: Infected lumbar spine. Referring Physician: Hospitalists  Donald [name] is an 75 y.o. male.  HPI: Patient is a 75 year old male with a long history of having had low back surgery. His postoperative course was complicated by infection and ultimately he was taken back about a month ago for irrigation and debridement. He was having severe bilateral lower extremity pain prior to that surgical intervention. He did reasonably well after that surgery and was continuing to improve on home IV antibiotic therapy until about 4 days ago. He began having increasing left leg pain at that point. The left leg pain became so severe this morning that he urgently came to the emergency room. Repeat MRI shows some worsening of epidural abscess prior to his previous study. The patient states that when he still has pain is not horrible but anytime he moves he is having severe shooting left leg pain all the way down to the foot. He has not had any change in his bladder or bowel habits and is been fairly regular with both. He has normal sensation of need for toileting and no issues with controlling his bladder or bowels.  Past Medical History:  Diagnosis Date  . Adenomatous polyps 10/2002, 06/2009  . Arthritis   . BPH (benign prostatic hyperplasia)   . Hyperlipemia   . Prostate cancer ([place])   . Spinal stenosis   . Wears glasses     Past Surgical History:  Procedure Laterality Date  . ANTERIOR CERVICAL DECOMP/DISCECTOMY FUSION N/A 09/18/2013   Procedure: Anterior cervical decompression fusion, cervical 4-5, cervical 5-6, cervical 6-7 with instrumentation and allograft;  Surgeon: Donald Ship, MD;  Location: [facility];  Service: Orthopedics;  Laterality: N/A;  Anterior cervical decompression fusion, cervical 4-5, cervical 5-6, cervical 6-7 with instrumentation and allograft  . COLONOSCOPY    . COLONOSCOPY W/ POLYPECTOMY  11/04/2002   Sigmoid adenomas x2, 1 cm maximum  . COLONOSCOPY W/  POLYPECTOMY  06/08/2009   6 polyps, largest 8 mm, at least 3 adenomas  . CYSTOSCOPY  04/2010  . ESOPHAGOGASTRODUODENOSCOPY  11/04/2002   Duodenitis, gastritis without H. pylori   . HIP ARTHROPLASTY  04/2010   Left - Dr. Mayer [name], right 2012  . JOINT REPLACEMENT    . LUMBAR LAMINECTOMY/DECOMPRESSION MICRODISCECTOMY N/A 03/30/2016   Procedure: LUMBAR 3-4, LUMBAR 4-5 DECOMPRESSION ;  Surgeon: Donald Bob, MD;  Location: [facility];  Service: Orthopedics;  Laterality: N/A;  LUMBAR 3-4, LUMBAR 4-5 DECOMPRESSION   . LUMBAR WOUND DEBRIDEMENT N/A 06/29/2016   Procedure: LUMBAR WOUND EXPLORATION AND IRRIGATION AND  DEBRIDEMENT;  Surgeon: Donald Bob, MD;  Location: [facility];  Service: Orthopedics;  Laterality: N/A;  . PROSTATE SURGERY    . RETROPUBIC PROSTATECTOMY  09/2003  . TONSILLECTOMY    . UPPER GASTROINTESTINAL ENDOSCOPY      Family History  Problem Relation Age of Onset  . Diabetes Mother   . Heart disease Mother   . Prostate cancer Father   . Cancer Brother     prostate  . Colon cancer Neg Hx   . Stomach cancer Neg Hx     Social History:  reports that he quit smoking about 43 years ago. His smoking use included Cigars. He has never used smokeless tobacco. He reports that he drinks about 1.2 oz of alcohol per week . He reports that he does not use drugs.  Allergies:  Allergies  Allergen Reactions  . Iodine Anaphylaxis  . Ivp Dye [Iodinated Diagnostic Agents] Anaphylaxis  Medications: I have reviewed the patient's current medications.  Results for orders placed or performed during the hospital encounter of 07/20/16 (from the past 48 hour(s))  Comprehensive metabolic panel     Status: Abnormal   Collection Time: 07/20/16  3:09 PM  Result Value Ref Range   Sodium 140 135 - 145 mmol/L   Potassium 3.9 3.5 - 5.1 mmol/L   Chloride 107 101 - 111 mmol/L   CO2 22 22 - 32 mmol/L   Glucose, Bld 93 65 - 99 mg/dL   BUN 11 6 - 20 mg/dL   Creatinine, Ser 0.75 0.61 - 1.24 mg/dL   Calcium 8.5  (L) 8.9 - 10.3 mg/dL   Total Protein 5.6 (L) 6.5 - 8.1 g/dL   Albumin 2.6 (L) 3.5 - 5.0 g/dL   AST 18 15 - 41 U/L   ALT 15 (L) 17 - 63 U/L   Alkaline Phosphatase 71 38 - 126 U/L   Total Bilirubin 0.5 0.3 - 1.2 mg/dL   GFR calc non Af Amer >60 >60 mL/min   GFR calc Af Amer >60 >60 mL/min    Comment: (NOTE) The eGFR has been calculated using the CKD EPI equation. This calculation has not been validated in all clinical situations. eGFR's persistently <60 mL/min signify possible Chronic Kidney Disease.    Anion gap 11 5 - 15  CBC with Differential     Status: Abnormal   Collection Time: 07/20/16  3:09 PM  Result Value Ref Range   WBC 6.5 4.0 - 10.5 K/uL   RBC 3.53 (L) 4.22 - 5.81 MIL/uL   Hemoglobin 8.2 (L) 13.0 - 17.0 g/dL   HCT 27.1 (L) 39.0 - 52.0 %   MCV 76.8 (L) 78.0 - 100.0 fL   MCH 23.2 (L) 26.0 - 34.0 pg   MCHC 30.3 30.0 - 36.0 g/dL   RDW 16.5 (H) 11.5 - 15.5 %   Platelets 285 150 - 400 K/uL   Neutrophils Relative % 69 %   Neutro Abs 4.6 1.7 - 7.7 K/uL   Lymphocytes Relative 16 %   Lymphs Abs 1.0 0.7 - 4.0 K/uL   Monocytes Relative 7 %   Monocytes Absolute 0.4 0.1 - 1.0 K/uL   Eosinophils Relative 8 %   Eosinophils Absolute 0.5 0.0 - 0.7 K/uL   Basophils Relative 0 %   Basophils Absolute 0.0 0.0 - 0.1 K/uL  I-Stat CG4 Lactic Acid, ED     Status: None   Collection Time: 07/20/16  3:17 PM  Result Value Ref Range   Lactic Acid, Venous 0.56 0.5 - 1.9 mmol/L  I-Stat CG4 Lactic Acid, ED     Status: None   Collection Time: 07/20/16  9:45 PM  Result Value Ref Range   Lactic Acid, Venous 0.77 0.5 - 1.9 mmol/L    Mr Lumbar Spine W Wo Contrast  Result Date: 07/20/2016 CLINICAL DATA:  75 year old male with chronic postoperative infection. Pain. Surgery 03/30/2016. EXAM: MRI LUMBAR SPINE WITHOUT AND WITH CONTRAST TECHNIQUE: Multiplanar and multiecho pulse sequences of the lumbar spine were obtained without and with intravenous contrast. CONTRAST:  66m MULTIHANCE GADOBENATE  DIMEGLUMINE 529 MG/ML IV SOLN COMPARISON:  06/29/2016, 05/29/2016 and 12/25/2015 CT. FINDINGS: Segmentation: Last fully open disk space is labeled L5-S1. Present examination incorporates from T11 through upper S3 level. Alignment:  Slight curvature. Post L3-4 and L4-5 laminectomy. Discitis/ osteomyelitis L4-5 level with interval further destruction of the L4 and L5 vertebra. 50% loss of height L5 and 20% maximal loss of  height of L4. Abscess within the L4-5 laminectomy site has enlarged measuring 2.3 x 1.9 x 1.1 cm versus prior 1 x 1.6 x 1.1 cm. Infectious process surrounds the thecal sac in a circumferential fashion extending from the upper L4 to the upper S1 level. This is most prominent in the ventral epidural space without central fluid containing collection as seen with epidural abscess. This narrows the thecal sac. Infectious process extends into the L4-5 and upper aspect of the L5-S1 neural foramen bilaterally. Paraspinal extension of infection into the psoas muscle bilaterally. This inflammatory process extends inferiorly along the psoas muscles and prevertebral region from L4 through lower sacrum. Paraspinal muscle infection L2-3 through L5-S1. Conus medullaris: Extends to the L1 level. Enhancement of the right L5 nerve root may be related to neuritis rather than arachnoiditis. 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 Facet degenerative changes. Small Schmorl's node deformity. L2-3: Facet bony overgrowth. Short pedicles. Mild slightly moderate spinal stenosis. L3-4:  Posterior decompression with superimposed infection. L4-5: Post decompression with superimposed infection as detailed above. L5-S1: Prominent facet degenerative changes and bony overgrowth. Bulge. Moderate bilateral foraminal narrowing. Lateral osteophyte greater on the right. IMPRESSION: Progressive discitis/osteomyelitis with further destruction of the L4 and L5  vertebral body. Increase in size of abscess within the L4-5 laminectomy site. Significant extension of inflammatory process as detailed above. Please see above for further detail. Electronically Signed   By: Donald [name] M.D.   On: 07/20/2016 19:36    ROS  ROS: I have reviewed the patient's review of systems thoroughly and there are no positive responses as relates to the HPI. Blood pressure 132/82, pulse 69, temperature 98.3 F (36.8 C), temperature source Oral, resp. rate 16, SpO2 96 %. Physical Exam Well-developed well-nourished patient in no acute distress. Alert and oriented x3 HEENT:within normal limits Cardiac: Regular rate and rhythm Pulmonary: Lungs clear to auscultation Abdomen: Soft and nontender.  Normal active bowel sounds  Musculoskeletal: (Lower extremities show essentially 5 over 5 strength in both right and left lower extremities all muscle groups tested. Sensation is completely intact in the lower extremities. Assessment/Plan: 75 year old male with postoperative lumbar infection having had washout approximately 1 month ago with currently worsening left leg pain which at times is been quite severe. He's had no issues with bowel or bladder.//I have talked with [name] who will evaluate the patient in the morning. He will review the MRI and make decisions about whether operative intervention versus continued antibiotic therapy would be most appropriate. We will fast-track antibiotic treatment tonight with vancomycin. Dr. Lenise Herald take over his care in the morning.  Chamberlain Steinborn L 07/20/2016, 10:37 PM

## 2016-07-20 NOTE — Progress Notes (Signed)
By signing my name below, I, [name], attest that this documentation has been prepared under the direction and in the presence of [name], MD.  Electronically Signed: [name], Medical Scribe. 07/20/16. 12:34 PM.  Subjective:    Patient ID: Donald [name], male    DOB: 10/24/1940, 75 y.o.   MRN: [id number]  HPI Chief Complaint  Patient presents with  . Hospitalization Follow-up    back pain     HPI Comments: Donald [name] is a 75 y.o. male who presents to the [facility] for hospital follow-up. See prior notes. 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 He underwent lumbar wound irrigation 06/29/16 sent home on 8 weeks of vancomycin and ceftriaxone with end date of 08/23/16. He is followed by home health. See telephone note of 3 days ago with possible candidal rash. He was started on lamisil 297m for 1 week and clotrimazole, topical.  This morning he has had the worst pain he's ever had which is secondary to the worsening pain he's been experiencing for the past 3-4 days; rating it a 6-7/10 last night and 10/10 this morming. This morning while getting ready he experienced intermittent a pain in 10 min intervals with pain shotting down to his foot, and it usually doesn't go down to his foot. There were also other times where the pain wasn't as bad with 3-4 mins. He has had 4 oxycodone since 4am, and he typically takes 2 oxycodone first thing in the morning along with 1 muscle relaxer. In the afternoon he takes 1 hydrocodone PRN, but lately he has had to take it in the afternoon due to increased pain. Occasionally, but not very often, will there be a time where he doesn't take pain medication in the afternoon. Pt last spoke with KJannifer Hick with Dr. DLynann Bologna3 days ago. Reports he was walking around a week ago, lost 35 lbs since Jan this year, some diarrhea, and abdominal pain. Denies saddle  anesthesia, bowel/urinary incontinence, fevers, and chills.  Rash: Pt has been compliant with ceftriaxone following with vancomycin in morning, reports the rash hasn't been spreading more  Patient Active Problem List   Diagnosis Date Noted  . Other acute osteomyelitis, other site ([place]   . Sequela of Corynebacterium infection   . Osteomyelitis of lumbar vertebra ([place] 06/29/2016  . 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 Discitis 06/29/2016  . Bilateral lower extremity pain   . Radicular pain 09/18/2013  . Hyperlipidemia 10/20/2011  . Insomnia 10/20/2011  . [place]state cancer ([place] 10/20/2011  . Personal history of colonic polyps 12/16/2010   Past Medical History:  Diagnosis Date  . Adenomatous polyps 10/2002, 06/2009  . Arthritis   . BPH (benign prostatic hyperplasia)   . Hyperlipemia   . [place]state cancer ([place]   . Spinal stenosis   . Wears glasses    Past Surgical History:  Procedure Laterality Date  . ANTERIOR CERVICAL DECOMP/DISCECTOMY FUSION N/A 09/18/2013   Procedure: Anterior cervical decompression fusion, cervical 4-5, cervical 5-6, cervical 6-7 with instrumentation and allograft;  Surgeon: MSinclair Ship MD;  [facility]  Service: Orthopedics;  Laterality: N/A;  Anterior cervical decompression fusion, cervical 4-5, cervical 5-6, cervical 6-7 with instrumentation and allograft  . COLONOSCOPY    . COLONOSCOPY W/ POLYPECTOMY  11/04/2002   Sigmoid adenomas x2, 1 cm maximum  . COLONOSCOPY W/ POLYPECTOMY  06/08/2009   6 polyps, largest 8 mm, at least 3 adenomas  .  CYSTOSCOPY  04/2010  . ESOPHAGOGASTRODUODENOSCOPY  11/04/2002   Duodenitis, gastritis without H. pylori   . HIP ARTHROPLASTY  04/2010   Left - Dr. Mayer Camel, right 2012  . JOINT REPLACEMENT    . LUMBAR LAMINECTOMY/DECOMPRESSION MICRODISCECTOMY N/A 03/30/2016   Procedure: LUMBAR 3-4, LUMBAR 4-5 DECOMPRESSION ;  Surgeon: Phylliss Bob, MD;  Location: [place];  Service: Orthopedics;  Laterality: N/A;  LUMBAR 3-4, LUMBAR 4-5  DECOMPRESSION   . LUMBAR WOUND DEBRIDEMENT N/A 06/29/2016   Procedure: LUMBAR WOUND EXPLORATION AND IRRIGATION AND  DEBRIDEMENT;  Surgeon: Phylliss Bob, MD;  Location: [place];  Service: Orthopedics;  Laterality: N/A;  . PROSTATE SURGERY    . RETROPUBIC PROSTATECTOMY  09/2003  . TONSILLECTOMY    . UPPER GASTROINTESTINAL ENDOSCOPY     Allergies  Allergen Reactions  . Iodine Anaphylaxis  . Ivp Dye [Iodinated Diagnostic Agents] Anaphylaxis   Prior to Admission medications   Medication Sig Start Date End Date Taking? Authorizing Provider  cefTRIAXone 2 g in dextrose 5 % 50 mL Inject 2 g into the vein daily. End date 08/23/2016 07/03/16  Yes Costin Karlyne Greenspan, MD  clotrimazole (LOTRIMIN) 1 % cream Apply 1 application topically 2 (two) times daily. 07/17/16  Yes [name], MD  docusate sodium (COLACE) 100 MG capsule Take 100 mg by mouth daily.   Yes Historical Provider, MD  HYDROcodone-acetaminophen (NORCO) 5-325 MG tablet Take 1-2 tablets by mouth every 6 (six) hours as needed for moderate pain. 07/03/16  Yes Phylliss Bob, MD  methocarbamol (ROBAXIN) 500 MG tablet Take 500 mg by mouth 3 (three) times daily.    Yes Historical Provider, MD  Multiple Vitamin (MULTIVITAMIN) capsule Take 1 capsule by mouth daily.     Yes Historical Provider, MD  terbinafine (LAMISIL) 250 MG tablet Take 1 tablet (250 mg total) by mouth daily. 07/17/16  Yes [name], MD  vancomycin (VANCOCIN) 1-5 GM/200ML-% SOLN Inject 200 mLs (1,000 mg total) into the vein every 12 (twelve) hours. Up until 08/23/2016 07/03/16  Yes Costin Karlyne Greenspan, MD  zolpidem (AMBIEN) 10 MG tablet TAKE 1/2 TABLET AT BEDTIME AS NEEDED FOR SLEEP. 05/16/16  Yes [name], MD   Social History   Social History  . Marital status: Married    Spouse name: N/A  . Number of children: N/A  . Years of education: N/A   Occupational History  . Retired     [name]   Social History Main Topics  . Smoking status: Former Smoker     Types: Cigars    Quit date: 09/12/1972  . Smokeless tobacco: Never Used  . Alcohol use 1.2 oz/week    2 Cans of beer per week     Comment: 1-2 beers a week  . Drug use: No  . Sexual activity: Yes     Comment: number of sex partners in the last months 1, current birth control method - no prostate   Other Topics Concern  . Not on file   Social History Narrative   Exercise do Tai Chi daily and walking   Married   Education: Theme park manager; Yes   Review of Systems  Constitutional: Negative for chills and fever.  Gastrointestinal: Negative for abdominal pain and diarrhea.  Musculoskeletal: Positive for back pain.  Skin: Positive for rash.  Neurological: Negative for numbness.    Objective:  Physical Exam  Constitutional: He appears well-developed and well-nourished. No distress (Appears to be overall comfortable rest, but pronounced discomfort in lower  back, buttock, left leg with movement.).  During exam he is layin on his left side with hips and knees partially flexed  HENT:  Head: Normocephalic and atraumatic.  Eyes: Conjunctivae are normal.  Neck: Neck supple.  Cardiovascular: Normal rate, regular rhythm and normal heart sounds.  Exam reveals no friction rub.   No murmur heard. Pulmonary/Chest: Effort normal. No respiratory distress. He has no wheezes. He has no rales.  Abdominal: Soft. There is no tenderness.  Musculoskeletal:  Dorsal and plantar flex his feet without difficulty  Neurological: He is alert.  Skin: Skin is warm and dry.  Patches of erythema along the visualized left buttock with possible small satellite lesions. Other areas not examined with difficulty in positioning due to pain.  Hyperpigmentation into the inguinal fold.  Psychiatric: He has a normal mood and affect. His behavior is normal.  Nursing note and vitals reviewed.  BP 110/80 (BP Location: Left Arm, Patient Position: Sitting, Cuff Size: Small)   Pulse 87   Temp 98 F (36.7 C) (Oral)   Resp  20   Ht 6' (1.829 m)   Wt 195 lb (88.5 kg)   SpO2 96%   BMI 26.45 kg/m    T2 seriousness of possible epidural infection, EMS transport as below. Over 40 minutes of coordination of care as well as evaluation of patient in office, over 50% counseling/coordination of care.  Assessment & Plan:  Donald [name] is a 75 y.o. male Osteomyelitis, unspecified site, unspecified type ([place])  Discitis of lumbar region  Acute left-sided low back pain with left-sided sciatica  Candidiasis of skin  History of osteomyelitis, status post surgery for epidural abscess October 19th. Had been improving with increased mobility decreasing pain into the last week, now with increasing pain past week especially past 3 days to the point of severe pain this morning with movement - experiencing 10 out of 10 or most severe pain he has experienced.  He does have ability to get into a more comfortable position with left lateral recumbent and hip and knee flexion, but otherwise difficulty with movement. Neurovascular intact distally in office, denies any fever or infectious symptoms, but with previous abscess causing similar severe pain, concern for possible reaccumulation or other complication. He did receive both his IV vancomycin and Rocephin this morning at home prior to our office visit. He has required for oxycodone already today which is much higher than what typically was needed for pain control. Last dose of 1 oxycodone at 11 AM.  -Initially planned on private vehicle transport, but due to difficulty with movement and positioning, chose to go by EMS. Report given with transfer of care to EMS at 1:15 PM, charge nurse at [facility] advised.    Visualized area of buttocks appears to be consistent with candidiasis, likely due to prolonged abx use,  but will need further evaluation in the ER for other areas. Exam limited by his discomfort here today. Currently on day #3 of Lamisil and clotrimazole topical.    No orders of  the defined types were placed in this encounter.  Patient Instructions    Further evaluation be done through the emergency room today, and they will likely call [name] or the doctor on-call if needed.   IF you received an x-ray today, you will receive an invoice from [facility]. Please contact [facility] at [phone] with questions or concerns regarding your invoice.   IF you received labwork today, you will receive an invoice from [facility]. Please contact [facility] at [phone] with questions or concerns regarding your invoice.   Our billing staff will not be able to assist you with questions regarding bills from these companies.  You will be contacted with the lab results as soon as they are available. The fastest way to get your results is to activate your My Chart account. Instructions are located on the last page of this paperwork. If you have not heard from [place] regarding the results in 2 weeks, please contact this office.        I personally performed the services described in this documentation, which was scribed in my presence. The recorded information has been reviewed and considered, and addended by me as needed.   Signed,   [name], MD [facility].  07/20/16 1:16 PM

## 2016-07-20 NOTE — ED Notes (Signed)
Nurse said ok for PT to have ice chips.

## 2016-07-20 NOTE — ED Provider Notes (Signed)
[facility] DEPT Provider Note   CSN: [id number] Arrival date & time: 07/20/16  1353     History   Chief Complaint Chief Complaint  Patient presents with  . Back Pain    HPI Donald [name] is a 75 y.o. male.  HPI  75 year old male sent from urgent care to the hospital for further evaluation of his back pain. Patient was admitted from October 19 to the 23rd for osteomyelitis of the lumbar spine with epidural abscess and discitis. He had a lumbar wound irrigation on the 19th and set home on 8 weeks of vancomycin and ceftriaxone. He has a home health provider. He was improving up until 3 days ago when he begins to develop worsening back pain. Pain is sharp starting in the L hip and shooting down to his L  Leg as far as his foot, not well controlled with his usual pain medication. He denies any fever, chills, bowel bladder incontinence, saddle anesthesia, or rash. He was able to ambulate about a week ago but haven't been able to since.  Orthopedist: [name]    Past Medical History:  Diagnosis Date  . Adenomatous polyps 10/2002, 06/2009  . Arthritis   . BPH (benign prostatic hyperplasia)   . Hyperlipemia   . Prostate cancer ([place])   . Spinal stenosis   . Wears glasses     Patient Active Problem List   Diagnosis Date Noted  . Other acute osteomyelitis, other site ([place])   . Sequela of Corynebacterium infection   . Osteomyelitis of lumbar vertebra ([place]) 06/29/2016  . 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 Discitis 06/29/2016  . Bilateral lower extremity pain   . Radicular pain 09/18/2013  . Hyperlipidemia 10/20/2011  . Insomnia 10/20/2011  . Prostate cancer ([place]) 10/20/2011  . Personal history of colonic polyps 12/16/2010    Past Surgical History:  Procedure Laterality Date  . ANTERIOR CERVICAL DECOMP/DISCECTOMY FUSION N/A 09/18/2013   Procedure: Anterior cervical decompression fusion, cervical 4-5, cervical 5-6, cervical 6-7 with instrumentation and allograft;  Surgeon:  Sinclair Ship, MD;  Location: [facility];  Service: Orthopedics;  Laterality: N/A;  Anterior cervical decompression fusion, cervical 4-5, cervical 5-6, cervical 6-7 with instrumentation and allograft  . COLONOSCOPY    . COLONOSCOPY W/ POLYPECTOMY  11/04/2002   Sigmoid adenomas x2, 1 cm maximum  . COLONOSCOPY W/ POLYPECTOMY  06/08/2009   6 polyps, largest 8 mm, at least 3 adenomas  . CYSTOSCOPY  04/2010  . ESOPHAGOGASTRODUODENOSCOPY  11/04/2002   Duodenitis, gastritis without H. pylori   . HIP ARTHROPLASTY  04/2010   Left - Dr. Mayer Camel, right 2012  . JOINT REPLACEMENT    . LUMBAR LAMINECTOMY/DECOMPRESSION MICRODISCECTOMY N/A 03/30/2016   Procedure: LUMBAR 3-4, LUMBAR 4-5 DECOMPRESSION ;  Surgeon: Phylliss Bob, MD;  Location: [facility];  Service: Orthopedics;  Laterality: N/A;  LUMBAR 3-4, LUMBAR 4-5 DECOMPRESSION   . LUMBAR WOUND DEBRIDEMENT N/A 06/29/2016   Procedure: LUMBAR WOUND EXPLORATION AND IRRIGATION AND  DEBRIDEMENT;  Surgeon: Phylliss Bob, MD;  Location: [facility] [place];  Service: Orthopedics;  Laterality: N/A;  . PROSTATE SURGERY    . RETROPUBIC PROSTATECTOMY  09/2003  . TONSILLECTOMY    . UPPER GASTROINTESTINAL ENDOSCOPY         Home Medications    Prior to Admission medications   Medication Sig Start Date End Date Taking? Authorizing Provider  cefTRIAXone 2 g in dextrose 5 % 50 mL Inject 2 g into the vein daily. End date 08/23/2016 07/03/16  Yes Costin M  Cruzita Lederer, MD  clotrimazole (LOTRIMIN) 1 % cream Apply 1 application topically 2 (two) times daily. 07/17/16  Yes [name], MD  docusate sodium (COLACE) 100 MG capsule Take 100 mg by mouth daily.   Yes Historical Provider, MD  HYDROcodone-acetaminophen (NORCO) 5-325 MG tablet Take 1-2 tablets by mouth every 6 (six) hours as needed for moderate pain. 07/03/16  Yes Phylliss Bob, MD  methocarbamol (ROBAXIN) 500 MG tablet Take 500 mg by mouth 3 (three) times daily.    Yes Historical Provider, MD  Multiple Vitamin (MULTIVITAMIN)  capsule Take 1 capsule by mouth daily.     Yes Historical Provider, MD  oxyCODONE-acetaminophen (PERCOCET/ROXICET) 5-325 MG tablet Take 1-2 tablets by mouth every 6 (six) hours as needed for severe pain.   Yes Historical Provider, MD  terbinafine (LAMISIL) 250 MG tablet Take 1 tablet (250 mg total) by mouth daily. 07/17/16  Yes [name], MD  vancomycin (VANCOCIN) 1-5 GM/200ML-% SOLN Inject 200 mLs (1,000 mg total) into the vein every 12 (twelve) hours. Up until 08/23/2016 07/03/16  Yes Costin Karlyne Greenspan, MD  zolpidem (AMBIEN) 10 MG tablet TAKE 1/2 TABLET AT BEDTIME AS NEEDED FOR SLEEP. 05/16/16  Yes [name], MD    Family History Family History  Problem Relation Age of Onset  . Diabetes Mother   . Heart disease Mother   . Prostate cancer Father   . Cancer Brother     prostate  . Colon cancer Neg Hx   . Stomach cancer Neg Hx     Social History Social History  Substance Use Topics  . Smoking status: Former Smoker    Types: Cigars    Quit date: 09/12/1972  . Smokeless tobacco: Never Used  . Alcohol use 1.2 oz/week    2 Cans of beer per week     Comment: 1-2 beers a week     Allergies   Iodine and Ivp dye [iodinated diagnostic agents]   Review of Systems Review of Systems  All other systems reviewed and are negative.    Physical Exam Updated Vital Signs BP 93/59 (BP Location: Left Arm)   Pulse 61   Temp 98.1 F (36.7 C) (Oral)   Resp 18   SpO2 97%   Physical Exam  Constitutional: He appears well-developed and well-nourished. No distress.  HENT:  Head: Atraumatic.  Eyes: Conjunctivae are normal.  Neck: Neck supple.  Cardiovascular: Normal rate and regular rhythm.   Pulmonary/Chest: Effort normal and breath sounds normal.  Abdominal: Soft. There is no tenderness.  Musculoskeletal: He exhibits tenderness (Tenderness to left lumbosacral region on palpation, increasing pain with left hip flexion and extension without gross deformity.).  No significant  midline spine tenderness crepitus or step-off. Normal appearing midline healing surgical scar  Neurological: He is alert.  Patellar deep tendon reflex intact bilaterally without foot drops. Intact dorsalis pedis pulses  Skin: No rash noted.  Psychiatric: He has a normal mood and affect.  Nursing note and vitals reviewed.    ED Treatments / Results  Labs (all labs ordered are listed, but only abnormal results are displayed) Labs Reviewed  COMPREHENSIVE METABOLIC PANEL - Abnormal; Notable for the following:       Result Value   Calcium 8.5 (*)    Total Protein 5.6 (*)    Albumin 2.6 (*)    ALT 15 (*)    All other components within normal limits  CBC WITH DIFFERENTIAL/PLATELET - Abnormal; Notable for the following:    RBC 3.53 (*)  Hemoglobin 8.2 (*)    HCT 27.1 (*)    MCV 76.8 (*)    MCH 23.2 (*)    RDW 16.5 (*)    All other components within normal limits  I-STAT CG4 LACTIC ACID, ED  I-STAT CG4 LACTIC ACID, ED    EKG  EKG Interpretation None       Radiology Mr Lumbar Spine W Wo Contrast  Result Date: 07/20/2016 CLINICAL DATA:  75 year old male with chronic postoperative infection. Pain. Surgery 03/30/2016. EXAM: MRI LUMBAR SPINE WITHOUT AND WITH CONTRAST TECHNIQUE: Multiplanar and multiecho pulse sequences of the lumbar spine were obtained without and with intravenous contrast. CONTRAST:  6mL MULTIHANCE GADOBENATE DIMEGLUMINE 529 MG/ML IV SOLN COMPARISON:  06/29/2016, 05/29/2016 and 12/25/2015 CT. FINDINGS: Segmentation: Last fully open disk space is labeled L5-S1. Present examination incorporates from T11 through upper S3 level. Alignment:  Slight curvature. Post L3-4 and L4-5 laminectomy. Discitis/ osteomyelitis L4-5 level with interval further destruction of the L4 and L5 vertebra. 50% loss of height L5 and 20% maximal loss of height of L4. Abscess within the L4-5 laminectomy site has enlarged measuring 2.3 x 1.9 x 1.1 cm versus prior 1 x 1.6 x 1.1 cm. Infectious  process surrounds the thecal sac in a circumferential fashion extending from the upper L4 to the upper S1 level. This is most prominent in the ventral epidural space without central fluid containing collection as seen with epidural abscess. This narrows the thecal sac. Infectious process extends into the L4-5 and upper aspect of the L5-S1 neural foramen bilaterally. Paraspinal extension of infection into the psoas muscle bilaterally. This inflammatory process extends inferiorly along the psoas muscles and prevertebral region from L4 through lower sacrum. Paraspinal muscle infection L2-3 through L5-S1. Conus medullaris: Extends to the L1 level. Enhancement of the right L5 nerve root may be related to neuritis rather than arachnoiditis. 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 Facet degenerative changes. Small Schmorl's node deformity. L2-3: Facet bony overgrowth. Short pedicles. Mild slightly moderate spinal stenosis. L3-4:  Posterior decompression with superimposed infection. L4-5: Post decompression with superimposed infection as detailed above. L5-S1: Prominent facet degenerative changes and bony overgrowth. Bulge. Moderate bilateral foraminal narrowing. Lateral osteophyte greater on the right. IMPRESSION: Progressive discitis/osteomyelitis with further destruction of the L4 and L5 vertebral body. Increase in size of abscess within the L4-5 laminectomy site. Significant extension of inflammatory process as detailed above. Please see above for further detail. Electronically Signed   By: Genia Del M.D.   On: 07/20/2016 19:36    Procedures Procedures (including critical care time)  Medications Ordered in ED Medications  LORazepam (ATIVAN) injection 1 mg (1 mg Intravenous Given 07/20/16 1827)  HYDROmorphone (DILAUDID) injection 1 mg (1 mg Intravenous Given 07/20/16 1524)  HYDROmorphone (DILAUDID) injection 1 mg (1 mg Intravenous  Given 07/20/16 1801)  gadobenate dimeglumine (MULTIHANCE) injection 20 mL (20 mLs Intravenous Contrast Given 07/20/16 1918)     Initial Impression / Assessment and Plan / ED Course  I have reviewed the triage vital signs and the nursing notes.  Pertinent labs & imaging results that were available during my care of the patient were reviewed by me and considered in my medical decision making (see chart for details).  Clinical Course     BP 106/93   Pulse 63   Temp 98.1 F (36.7 C) (Oral)   Resp 18   SpO2 97%    Final Clinical Impressions(s) / ED Diagnoses  Final diagnoses:  Low back pain  Discitis of lumbar region  Osteomyelitis of lumbar spine (HCC)  Abscess in epidural space of lumbar spine    New Prescriptions New Prescriptions   No medications on file   3:23 PM This patient was recently diagnosed with osteomyelitis, has wound irrigation of spine last month and currently receiving IV vancomycin and Rocephin through a PICC line. He is here due to worsening radicular pain down to his left lower extremities, likely sciatica. He denies having any fever and does not have any significant midline spine tenderness at this time however given his recent diagnosis of osteomyelitis, I will repeat a lumbar MRI with and without contrast to assess for potential worsening osteomyelitis. Pain medication given. Patient is unable to ambulate at this time due to pain. Care discussed with Dr. Johnney Killian.   8:04 PM Patient has normal lactic acid, labs are otherwise reassuring however lumbar spine MRI demonstrate progressive discitis and osteomyelitis with further destruction of the L4 and L5 vertebral body. Increase in size of abscess within the L4-L5 laminectomy sites. Also significant extensions of inflammatory process were noted.   Appreciate consultation from oncall orthopedist Dr. Berenice Primas who request medical admission and he will reach out to [name] for further recommendation.    Dr.  Berenice Primas called back and sts to have medicine for admission.  ID may need to be involve to find out why pt is getting worse.  Pt should be kept NPO as there's a chance for surgery tomorrow.   8:13 PM Appreciate consultation from [facility] Dr. Redmond Pulling who agrees to see pt in the ER and will admit to med surg for further care.  Pt voice understanding and agrees with plan.         Domenic Moras, PA-C 07/20/16 2015    Charlesetta Shanks, MD 08/01/16 2135

## 2016-07-20 NOTE — ED Triage Notes (Signed)
Pt presents via EMS for evaluation of lumbar back pain worsening over the past week. Pt. Of [name], had laminectomy in July and recent sx for infection in site mid October. Pt. States was walking and doing well one week ago. Pt. States is having "spasms" from upper L hip around to L foot. Pt. Sent by PCP for rule out abscess.

## 2016-07-20 NOTE — Progress Notes (Addendum)
I have reviewed the patient's MRI and discussed the case with [name].  He is perplexed at why the patient would have advancement of his infectious process.  At this point the patient needs admission to the medical service and consultation with infectious disease.  [name] will review the MRI later tonight and ultimately make decisions about possible surgical intervention tomorrow based on the patient's symptomatology and MRI findings.  The patient needs to be kept n.p.o.

## 2016-07-20 NOTE — ED Notes (Signed)
Contacted by MRI that pt has persistent pain and is requesting further medication for procedure.

## 2016-07-20 NOTE — Patient Instructions (Addendum)
  Further evaluation be done through the emergency room today, and they will likely call [name] or the doctor on-call if needed.   IF you received an x-ray today, you will receive an invoice from [facility]. Please contact [facility] at [phone] with questions or concerns regarding your invoice.   IF you received labwork today, you will receive an invoice from [employer]. Please contact [facility] at [phone] with questions or concerns regarding your invoice.   Our billing staff will not be able to assist you with questions regarding bills from these companies.  You will be contacted with the lab results as soon as they are available. The fastest way to get your results is to activate your My Chart account. Instructions are located on the last page of this paperwork. If you have not heard from [place] regarding the results in 2 weeks, please contact this office.

## 2016-07-20 NOTE — H&P (Signed)
History and Physical   Donald [name] KPT:[id number] DOB: Dec 03, 1940 DOA: 07/20/2016  PCP: [name], MD  Chief Complaint: Back Pain  HPI:  Donald [name] is a 75 year old Caucasian male past history significant for prostate cancer status post prostatectomy, and spinal stenosis status post L3/L4 and L4/L5 decompression on 720/2017 by [name]. 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 He was placed on vancomycin and ceftriaxone to complete an 8 week course of home antibiotics via right upper charity PICC line. Patient states he was initially recovering well at home. Approximate 1 week ago, he was ambulating with his walker and felt he was recovering as expected. However, approximately 4 days ago, patient noticed a dull aching sensation in the left hip. This was associated with muscle spasms and a shooting sensation down his left lower extremity. Over the past several days, the pain progressed to an intense, sharp pain in the lumbar region. No alleviating or exacerbating factors. The pain progressed to the point of decreased ability to ambulate. Patient denies any fever chills at home. Patient states he was very adherent to the outpatient antibiotic regimen. He had a home health nurse visit. No bowel or bladder incontinence. No redness or pain along the PICC line insertion site. No other recent changes in medications.   ED Course:  Patient was hemodynamically stable in the emergency department. He was afebrile, heart rate in the 60s, and nl blood pressures. MR of the lumbar spine showed abscess within the L4-L5 laminectomy site that has enlarged since last imaging test. The ER physician notified Dr. Berenice Primas, who discussed the case with [name]. The surgical team is potentially planning for possible surgical intervention tomorrow morning.  Review of Systems: A complete ROS was obtained; pertinent positives  negatives are denoted in the HPI. Otherwise, all systems are negative.   Past Medical History:  Diagnosis Date  . Adenomatous polyps 10/2002, 06/2009  . Arthritis   . BPH (benign prostatic hyperplasia)   . Hyperlipemia   . Prostate cancer ([place])   . Spinal stenosis   . Wears glasses    Social History   Social History  . Marital status: Married    Spouse name: N/A  . Number of children: N/A  . Years of education: N/A   Occupational History  . Retired     [name]   Social History Main Topics  . Smoking status: Former Smoker    Types: Cigars    Quit date: 09/12/1972  . Smokeless tobacco: Never Used  . Alcohol use 1.2 oz/week    2 Cans of beer per week     Comment: 1-2 beers a week  . Drug use: No  . Sexual activity: Yes     Comment: number of sex partners in the last months 1, current birth control method - no prostate   Other Topics Concern  . Not on file   Social History Narrative   Exercise do Tai Chi daily and walking   Married   Education: Theme park manager; Yes   Family History  Problem Relation Age of Onset  . Diabetes Mother   . Heart disease Mother   . Prostate cancer Father   . Cancer Brother     prostate  . Colon cancer Neg Hx   . Stomach cancer Neg Hx     Physical Exam: Vitals:   07/20/16 1700 07/20/16 1715 07/20/16 1730 07/20/16 1745  BP: 127/69 115/69 113/75 106/93  Pulse: 63 62 60 63  Resp: 16   18  Temp:      TempSrc:      SpO2: 97% 96% 97% 97%   General: Appears calm and comfortable. ENT: Grossly normal hearing, MMM. Cardiovascular: RRR. 2/6 systolic murmur over left 2nd ICS. No LE edema.  Respiratory: CTA bilaterally. No wheezes or crackles. Normal respiratory effort. Abdomen: Soft, non-tender. Bowel sounds present.  Skin: No rash or induration seen on limited exam. PICC line noted in her right upper extremity. Dressing in place. No surrounding erythema or tenderness. Musculoskeletal: Grossly normal tone BUE/BLE.  Psychiatric:  Grossly normal mood and affect. Neurologic: Has full sensation to touch in the upper and lower extremities. Patient is able to lift right lower extremity off the bed against gravity. Patient is not able to fully lift the left lower extremity off the bed against gravity- requires assistance from examiner.  I have personally reviewed the following labs, culture data, and imaging studies.  Labs:  Blood work obtained in the emergency department showed white blood cell count 6.5, hemoglobin 8.2, platelet count 285. Lactic acid was normal. Patient has normal renal function with BUN 11, and creatinine 0.75.  Cultures:  Blood cx (11/9) -Pending  Radiology:  MR Lumbar (11/9) Abscess within the L4-L5 laminectomy site. Osteomyelitis at the L4-L5 level with further distraction of the vertebrae.  Assessment/Plan: Donald [name] is a 75 year old Caucasian male past history significant for prostate cancer status post prostatectomy, and spinal stenosis status post L3/L4 and L4/L5 decompression on 720/2017 by [name].   1. Osteomyelitis of L4-L5/epidural abscess Patient was recently discharged from the hospital and was completing an 8 week course of IV antibiotics with IV vancomycin and ceftriaxone at home. Unfortunately, he presented with worsening back pain with new radiographic images of worsening osteomyelitis and enlarged abscess in the L4-L5 laminectomy site. Currently, patient is hemodynamically stable and appears well. He is without significant pain, and does not have a fever or leukocytosis. Orthopedic surgery has been consulted; they are potentially planning for surgical intervention tomorrow morning. Plan: -Pain control with narcotics; scheduled bowel regimen to prevent constipation -Resume home antibiotics with IV vancomycin and ceftriaxone. If patient develops worsening clinical picture or hemodynamic instability, I will start IV Zosyn. Patient is at risk for pseudomonas given recent surgical  intervention. However, given stability of the patient, I would prefer to withhold broad-spectrum antibiotics so that a biopsy can be taken to help guide a more tailored antimicrobial approach. -Nothing by mouth at midnight -Formal infectious disease consult in the morning  DVT prophylaxis: SCDs; holding Lovenox for surgery in AM Code Status: Full Code Disposition Plan: Anticipate D/C home in 2-5 days Consults called: N/A Admission status: Inpatient  Clydene Laming, MD [facility] Page:[phone]  If 7PM-7AM, please contact night-coverage [email or web address] Password [email or web address]

## 2016-07-21 ENCOUNTER — Other Ambulatory Visit: Payer: Self-pay

## 2016-07-21 ENCOUNTER — Inpatient Hospital Stay (HOSPITAL_COMMUNITY): Payer: Medicare Other

## 2016-07-21 DIAGNOSIS — Z87891 Personal history of nicotine dependence: Secondary | ICD-10-CM

## 2016-07-21 DIAGNOSIS — Z888 Allergy status to other drugs, medicaments and biological substances status: Secondary | ICD-10-CM

## 2016-07-21 DIAGNOSIS — Z91041 Radiographic dye allergy status: Secondary | ICD-10-CM

## 2016-07-21 DIAGNOSIS — R634 Abnormal weight loss: Secondary | ICD-10-CM | POA: Diagnosis present

## 2016-07-21 DIAGNOSIS — T814XXA Infection following a procedure, initial encounter: Secondary | ICD-10-CM

## 2016-07-21 DIAGNOSIS — Z833 Family history of diabetes mellitus: Secondary | ICD-10-CM

## 2016-07-21 DIAGNOSIS — Z800 Family history of malignant neoplasm of digestive organs: Secondary | ICD-10-CM

## 2016-07-21 DIAGNOSIS — Z8042 Family history of malignant neoplasm of prostate: Secondary | ICD-10-CM

## 2016-07-21 DIAGNOSIS — Z9889 Other specified postprocedural states: Secondary | ICD-10-CM

## 2016-07-21 DIAGNOSIS — Z8249 Family history of ischemic heart disease and other diseases of the circulatory system: Secondary | ICD-10-CM

## 2016-07-21 DIAGNOSIS — D509 Iron deficiency anemia, unspecified: Secondary | ICD-10-CM | POA: Diagnosis present

## 2016-07-21 DIAGNOSIS — Y838 Other surgical procedures as the cause of abnormal reaction of the patient, or of later complication, without mention of misadventure at the time of the procedure: Secondary | ICD-10-CM

## 2016-07-21 DIAGNOSIS — M48061 Spinal stenosis, lumbar region without neurogenic claudication: Secondary | ICD-10-CM | POA: Diagnosis present

## 2016-07-21 DIAGNOSIS — B379 Candidiasis, unspecified: Secondary | ICD-10-CM

## 2016-07-21 LAB — COMPREHENSIVE METABOLIC PANEL
ALBUMIN: 2.4 g/dL — AB (ref 3.5–5.0)
ALT: 14 U/L — ABNORMAL LOW (ref 17–63)
ANION GAP: 5 (ref 5–15)
AST: 16 U/L (ref 15–41)
Alkaline Phosphatase: 67 U/L (ref 38–126)
BILIRUBIN TOTAL: 0.7 mg/dL (ref 0.3–1.2)
BUN: 8 mg/dL (ref 6–20)
CHLORIDE: 110 mmol/L (ref 101–111)
CO2: 26 mmol/L (ref 22–32)
Calcium: 8.3 mg/dL — ABNORMAL LOW (ref 8.9–10.3)
Creatinine, Ser: 0.8 mg/dL (ref 0.61–1.24)
GFR calc Af Amer: >60 mL/min (ref >60)
GFR calc non Af Amer: >60 mL/min (ref >60)
GLUCOSE: 93 mg/dL (ref 65–99)
POTASSIUM: 3.7 mmol/L (ref 3.5–5.1)
SODIUM: 141 mmol/L (ref 135–145)
TOTAL PROTEIN: 5.7 g/dL — AB (ref 6.5–8.1)

## 2016-07-21 LAB — FERRITIN: Ferritin: 80 ng/mL (ref 24–336)

## 2016-07-21 LAB — CBC
HCT: 26.4 % — ABNORMAL LOW (ref 39.0–52.0)
Hemoglobin: 8.1 g/dL — ABNORMAL LOW (ref 13.0–17.0)
MCH: 23.5 pg — ABNORMAL LOW (ref 26.0–34.0)
MCHC: 30.7 g/dL (ref 30.0–36.0)
MCV: 76.7 fL — ABNORMAL LOW (ref 78.0–100.0)
PLATELETS: 271 10*3/uL (ref 150–400)
RBC: 3.44 MIL/uL — ABNORMAL LOW (ref 4.22–5.81)
RDW: 16.8 % — AB (ref 11.5–15.5)
WBC: 5.9 10*3/uL (ref 4.0–10.5)

## 2016-07-21 LAB — IRON AND TIBC
IRON: 12 ug/dL — AB (ref 45–182)
SATURATION RATIOS: 5 % — AB (ref 17.9–39.5)
TIBC: 237 ug/dL — ABNORMAL LOW (ref 250–450)
UIBC: 225 ug/dL

## 2016-07-21 LAB — PROTIME-INR
INR: 1.11
Prothrombin Time: 14.4 seconds (ref 11.4–15.2)

## 2016-07-21 LAB — MRSA PCR SCREENING: MRSA BY PCR: NEGATIVE

## 2016-07-21 LAB — VANCOMYCIN, TROUGH: VANCOMYCIN TR: 18 ug/mL (ref 15–20)

## 2016-07-21 MED ORDER — OXYCODONE HCL ER 20 MG PO T12A
20.0000 mg | EXTENDED_RELEASE_TABLET | Freq: Two times a day (BID) | ORAL | Status: DC
  Administered 2016-07-21 – 2016-07-31 (×20): 20 mg via ORAL
  Filled 2016-07-21 (×21): qty 1

## 2016-07-21 MED ORDER — CEFTAZIDIME 2 G IJ SOLR
2.0000 g | Freq: Three times a day (TID) | INTRAMUSCULAR | Status: DC
  Administered 2016-07-21 – 2016-07-27 (×17): 2 g via INTRAVENOUS
  Filled 2016-07-21 (×23): qty 2

## 2016-07-21 MED ORDER — VANCOMYCIN HCL IN DEXTROSE 1-5 GM/200ML-% IV SOLN
1000.0000 mg | Freq: Two times a day (BID) | INTRAVENOUS | Status: DC
  Administered 2016-07-22 – 2016-07-27 (×11): 1000 mg via INTRAVENOUS
  Filled 2016-07-21 (×15): qty 200

## 2016-07-21 MED ORDER — POLYETHYLENE GLYCOL 3350 17 G PO PACK
17.0000 g | PACK | Freq: Every day | ORAL | Status: DC
  Administered 2016-07-21 – 2016-07-28 (×8): 17 g via ORAL
  Filled 2016-07-21 (×9): qty 1

## 2016-07-21 MED ORDER — DEXTROSE 5 % IV SOLN
2.0000 g | INTRAVENOUS | Status: DC
  Filled 2016-07-21: qty 2

## 2016-07-21 MED ORDER — FLUCONAZOLE 100 MG PO TABS
100.0000 mg | ORAL_TABLET | Freq: Every day | ORAL | Status: DC
  Administered 2016-07-21 – 2016-07-31 (×11): 100 mg via ORAL
  Filled 2016-07-21 (×11): qty 1

## 2016-07-21 NOTE — Consult Note (Signed)
[facility] for Infect[facility]    Date of Admission:  07/20/2016   Total days of antibiotics 23              Reason for Consult: Acute worsening of low back pain while on therapy for Propionibacterium lumbar infection    Referring Physician: Dr. Lawson Radar  Active Problems:   Epidural abscess   Propionibacterium infection   Osteomyelitis of lumbar spine (HCC)   Status post lumbar laminectomy   Spinal stenosis of lumbar region   Microcytic anemia   Unintentional weight loss   . cefTRIAXone (ROCEPHIN)  IV  2 g Intravenous Q24H  . docusate sodium  100 mg Oral Daily  . multivitamin with minerals  1 tablet Oral Daily  . oxyCODONE  20 mg Oral Q12H  . polyethylene glycol  17 g Oral Daily  . sodium chloride flush  3 mL Intravenous Q12H  . terbinafine  250 mg Oral Daily  . vancomycin  1,000 mg Intravenous Q12H    Recommendations: 1. Continue vancomycin 2. Change ceftriaxone to ceftazidime 3. Change terbinafine to fluconazole 4. If he goes to surgery next week I would submit tissue for pathology, Gram stain, routine culture and AFB stain and culture   Assessment: I am not sure why he developed acute worsening of back pain while on treatment that should be effective for Propionibacterium vertebral infection. I will broaden his antibiotic therapy now. If he does go to surgery next week for lumbar fusion I would submit specimens for pathologic review, routine and AFB cultures.    HPI: Donald [name] is a 75 y.o. male with spinal stenosis who underwent L3-4 laminectomy on 03/30/2016. Postoperatively he initially had improvement in his pain but then he started to feel generally weak and started to lose weight. A small fluid collection was noted on postoperative scan but was initially simply followed. He developed worsening back pain and was readmitted and underwent incision and drainage for lumbar infection and epidural abscess on 06/29/2016. Operative cultures showed  gram-positive cocci and grew Propionibacterium acnes. He was discharged on IV ceftriaxone and vancomycin. He has taken his medications faithfully and on time. He felt like he was doing better but then about 5 days ago noted marked worsening of his low back and left leg pain leading to readmission yesterday. MRI shows worsening of his L4-5 osteomyelitis and epidural abscess with extension into the psoas muscle.   Review of Systems: Review of Systems  Constitutional: Positive for malaise/fatigue and weight loss. Negative for chills, diaphoresis and fever.       He has lost about 35 pounds unintentionally since his surgery in July.  HENT: Negative for sore throat.   Respiratory: Negative for cough, sputum production and shortness of breath.   Cardiovascular: Negative for chest pain.  Gastrointestinal: Negative for abdominal pain, diarrhea, heartburn, nausea and vomiting.  Genitourinary: Negative for dysuria and frequency.  Musculoskeletal: Positive for back pain and joint pain. Negative for myalgias.  Skin: Positive for rash.       He developed perineal candidiasis over the past week.  Neurological: Positive for weakness and headaches. Negative for dizziness and focal weakness.    Past Medical History:  Diagnosis Date  . Adenomatous polyps 10/2002, 06/2009  . Arthritis   . BPH (benign prostatic hyperplasia)   . Hyperlipemia   . Prostate cancer ([place])   . Spinal stenosis   . Wears glasses     Social History  Substance Use Topics  . Smoking status: Former Smoker    Types: Cigars    Quit date: 09/12/1972  . Smokeless tobacco: Never Used  . Alcohol use 1.2 oz/week    2 Cans of beer per week     Comment: 1-2 beers a week    Family History  Problem Relation Age of Onset  . Diabetes Mother   . Heart disease Mother   . Prostate cancer Father   . Cancer Brother     prostate  . Colon cancer Neg Hx   . Stomach cancer Neg Hx    Allergies  Allergen Reactions  . Iodine Anaphylaxis  .  Ivp Dye [Iodinated Diagnostic Agents] Anaphylaxis    OBJECTIVE: Blood pressure 121/68, pulse 68, temperature 98.3 F (36.8 C), temperature source Oral, resp. rate 16, SpO2 96 %.  Physical Exam  Constitutional: He is oriented to person, place, and time.  He is alert and uncomfortable due to pain.  HENT:  Mouth/Throat: No oropharyngeal exudate.  Eyes: Conjunctivae are normal.  Neck: Neck supple.  Cardiovascular: Normal rate and regular rhythm.   No murmur heard. Pulmonary/Chest: Effort normal and breath sounds normal.  Abdominal: Soft. There is no tenderness.  Neurological: He is alert and oriented to person, place, and time.  Skin: Rash noted.  He has a diffuse erythema in his central perineum and buttocks with maculopapular, erythematous satellite lesions.  Psychiatric: Mood and affect normal.    Lab Results Lab Results  Component Value Date   WBC 5.9 07/21/2016   HGB 8.1 (L) 07/21/2016   HCT 26.4 (L) 07/21/2016   MCV 76.7 (L) 07/21/2016   PLT 271 07/21/2016    Lab Results  Component Value Date   CREATININE 0.80 07/21/2016   BUN 8 07/21/2016   NA 141 07/21/2016   K 3.7 07/21/2016   CL 110 07/21/2016   CO2 26 07/21/2016    Lab Results  Component Value Date   ALT 14 (L) 07/21/2016   AST 16 07/21/2016   ALKPHOS 67 07/21/2016   BILITOT 0.7 07/21/2016    Sed Rate (mm/hr)  Date Value  07/02/2016 58 (H)  06/29/2016 58 (H)  11/08/2015 4   CRP (mg/dL)  Date Value  07/02/2016 14.2 (H)  06/29/2016 16.9 (H)    Microbiology: Recent Results (from the past 240 hour(s))  MRSA PCR Screening     Status: None   Collection Time: 07/21/16  4:43 AM  Result Value Ref Range Status   MRSA by PCR NEGATIVE NEGATIVE Final    Comment:        The GeneXpert MRSA Assay (FDA approved for NASAL specimens only), is one component of a comprehensive MRSA colonization surveillance program. It is not intended to diagnose MRSA infection nor to guide or monitor treatment for MRSA  infections.    MRI of lumbar spine with and without contrast 07/20/2016  IMPRESSION: Progressive discitis/osteomyelitis with further destruction of the L4 and L5 vertebral body.  Increase in size of abscess within the L4-5 laminectomy site.  Significant extension of inflammatory process as detailed above. Please see above for further detail.    By: Genia Del M.D.   On: 07/20/2016 19:36  Michel Bickers, [place] [facility] [phone] pager   [phone] cell 07/21/2016, 3:35 PM

## 2016-07-21 NOTE — Progress Notes (Signed)
Patient presented with worsening L leg pain. Current leg pain is minimal, but increased when he stands and walks. Patient responded well to his I and D 3 weeks ago and has been on abx per ID. Unfortunately, his updated MRI does show a reacumulation and significant progress of his discitis. The infection appears to be very aggressive, and is destroying his L4 ane L5 vertebral bodies and is extending posteriorly into the epidural space, despite appropriate surgical and IV treatment, his infection clearly is aggressive and is progressive. I did discuss with the patient that he will likely need additional surgery, which will likely require an anterior debridement and reconstruction and fusion of his spinal column with a concomitant posterior stabilization procedure. Given the complexity of this patient current situation, I have also reached out to a colleague, Dr. Rolena Infante, for an additional opinion regarding this patient's complex situation. He is due to see him later today. I did order STAT CT scan and added oxycontin 20mg  BID for better pain control.

## 2016-07-21 NOTE — Progress Notes (Signed)
PROGRESS NOTE    Donald [name]  [id number] DOB: [birth year] DOA: 07/20/2016 PCP: [name], MD   Brief Narrative: 75 year old Caucasian male past history significant for prostate cancer status post prostatectomy, and spinal stenosis status post L3/L4 and L4/L5 decompression on 720/2017 by [name]. 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 He was placed on vancomycin and ceftriaxone to complete an 8 week course of home antibiotics via  RUE PICC line, presented with worsening back pain. Imaging studies showed osteomyelitis of L4-L5 and epidural abscess. Orthopedics consult following.  Assessment & Plan:   # Osteomyelitis/discitis of L4-L5 and epidural abscess: -Continue IV vancomycin and IV ceftriaxone via PICC line. -Orthopedics evaluation ongoing for surgical procedure. Consult appreciated. -I discussed with Dr. Megan Salon from infectious disease for the consult. Patient will be seen today. -Follow up culture results. -Continue to provide supportive care.  #Pain management: Patient is on OxyContin 20 mg twice a day and Percocet as needed for the pain controlled. On Colace. I will add MiraLAX for the bowel regimen since patient has no BM for last 2 days.  # Microcytic anemia: Hemoglobin around baseline. I will check iron studies. Monitor labs.  DVT prophylaxis: SCDs. No anticoagulation because of possible surgical intervention. Code Status: Full code Family Communication: Patient's wife at bedside Disposition Plan: Likely discharge to rehabilitation versus home with home care in 3-4 days.    Consultants:   Orthopedics and infectious disease  Procedures: None Antimicrobials: IV vancomycin and ceftriaxone  Subjective: Patient was seen and examined at bedside. Patient reported back pain especially with movement. Denied fever, chills, nausea, vomiting, chest pain or shortness of breath. No  abdominal pain.   Objective: Vitals:   07/20/16 2137 07/20/16 2233 07/21/16 0415 07/21/16 1251  BP: 122/74 132/82 (!) 148/80 121/68  Pulse:  69 70 68  Resp: 16 16 16 16   Temp:  98.3 F (36.8 C) 98.7 F (37.1 C) 98.3 F (36.8 C)  TempSrc:  Oral Oral Oral  SpO2: 97% 96% 95% 96%    Intake/Output Summary (Last 24 hours) at 07/21/16 1256 Last data filed at 07/21/16 0900  Gross per 24 hour  Intake              230 ml  Output              250 ml  Net              -20 ml   There were no vitals filed for this visit.  Examination:  General exam: Appears calm and comfortable  Respiratory system: Clear to auscultation. Respiratory effort normal. No wheezing or crackle Cardiovascular system: S1 & S2 heard, RRR.  No pedal edema. Gastrointestinal system: Abdomen is nondistended, soft and nontender. Normal bowel sounds heard. Central nervous system: Alert and oriented. No focal neurological deficits. Extremities: Difficulty moving lower extremities because of back pain. Skin: No rashes, lesions or ulcers Psychiatry: Judgement and insight appear normal. Mood & affect appropriate.     Data Reviewed: I have personally reviewed following labs and imaging studies  CBC:  Recent Labs Lab 07/20/16 1509 07/21/16 0500  WBC 6.5 5.9  NEUTROABS 4.6  --   HGB 8.2* 8.1*  HCT 27.1* 26.4*  MCV 76.8* 76.7*  PLT 285 [id number]   Basic Metabolic Panel:  Recent Labs Lab 07/20/16 1509 07/21/16 0500  NA 140 141  K 3.9 3.7  CL 107 110  CO2 22 26  GLUCOSE 93 93  BUN 11 8  CREATININE 0.75 0.80  CALCIUM 8.5* 8.3*   GFR: Estimated Creatinine Clearance: 90.3 mL/min (by C-G formula based on SCr of 0.8 mg/dL). Liver Function Tests:  Recent Labs Lab 07/20/16 1509 07/21/16 0500  AST 18 16  ALT 15* 14*  ALKPHOS 71 67  BILITOT 0.5 0.7  PROT 5.6* 5.7*  ALBUMIN 2.6* 2.4*   No results for input(s): LIPASE, AMYLASE in the last 168 hours. No results for input(s): AMMONIA in the last 168  hours. Coagulation Profile:  Recent Labs Lab 07/21/16 0500  INR 1.11   Cardiac Enzymes: No results for input(s): CKTOTAL, CKMB, CKMBINDEX, TROPONINI in the last 168 hours. BNP (last 3 results) No results for input(s): PROBNP in the last 8760 hours. HbA1C: No results for input(s): HGBA1C in the last 72 hours. CBG: No results for input(s): GLUCAP in the last 168 hours. Lipid Profile: No results for input(s): CHOL, HDL, LDLCALC, TRIG, CHOLHDL, LDLDIRECT in the last 72 hours. Thyroid Function Tests: No results for input(s): TSH, T4TOTAL, FREET4, T3FREE, THYROIDAB in the last 72 hours. Anemia Panel: No results for input(s): VITAMINB12, FOLATE, FERRITIN, TIBC, IRON, RETICCTPCT in the last 72 hours. Sepsis Labs:  Recent Labs Lab 07/20/16 1517 07/20/16 2145  LATICACIDVEN 0.56 0.77    Recent Results (from the past 240 hour(s))  MRSA PCR Screening     Status: None   Collection Time: 07/21/16  4:43 AM  Result Value Ref Range Status   MRSA by PCR NEGATIVE NEGATIVE Final    Comment:        The GeneXpert MRSA Assay (FDA approved for NASAL specimens only), is one component of a comprehensive MRSA colonization surveillance program. It is not intended to diagnose MRSA infection nor to guide or monitor treatment for MRSA infections.          Radiology Studies: Ct Lumbar Spine Wo Contrast  Result Date: 07/21/2016 CLINICAL DATA:  Postoperative infection.  Lumbar surgery in July. EXAM: CT LUMBAR SPINE WITHOUT CONTRAST TECHNIQUE: Multidetector CT imaging of the lumbar spine was performed without intravenous contrast administration. Multiplanar CT image reconstructions were also generated. COMPARISON:  MRI from yesterday FINDINGS: Segmentation: Standard and as numbered yesterday. Alignment: L4-5 retrolisthesis. Vertebrae: Extensive irregular endplate erosion around the L4-5 disc with disc widening from abscess seen by MRI yesterday. Facet joints are widened at this level, without  erosive changes. Laminectomy centered at L4 with diffuse soft tissue stranding, an abscess is noted in this region by MRI. No fracture. No underlying bone lesion. Paraspinal and other soft tissues: Extensive inflammation around the L4-5 discitis and L4-5 laminectomy abscess. Disc levels: T12- L1: Unremarkable. L1-L2: Degenerative disc narrowing and endplate ridging with facet hypertrophy. Subarticular recess narrowing. L2-L3: Advanced facet arthropathy with hypertrophy and bilateral subarticular recess stenosis. Mild spondylosis. L3-L4: Advanced facet arthropathy with hypertrophy and bilateral subarticular recess narrowing. Spondylosis. L4-L5: Advanced facet arthropathy. Changes of decompression and infection described above. Advanced foraminal narrowing, especially on the right. L5-S1:Hyper trophic facet arthropathy.  Mild annulus bulging. IMPRESSION: Active L4-5 discitis with discal abscess. L4 laminectomy abscess not well-defined compared to MRI yesterday. L4-5 retrolisthesis with canal and biforaminal impingement. Electronically Signed   By: Monte Fantasia M.D.   On: 07/21/2016 09:21   Mr Lumbar Spine W Wo Contrast  Result Date: 07/20/2016 CLINICAL DATA:  75 year old male with chronic postoperative infection. Pain. Surgery 03/30/2016. EXAM: MRI LUMBAR SPINE WITHOUT AND WITH CONTRAST TECHNIQUE: Multiplanar and multiecho pulse sequences of the lumbar spine were obtained without and with intravenous  contrast. CONTRAST:  49mL MULTIHANCE GADOBENATE DIMEGLUMINE 529 MG/ML IV SOLN COMPARISON:  06/29/2016, 05/29/2016 and 12/25/2015 CT. FINDINGS: Segmentation: Last fully open disk space is labeled L5-S1. Present examination incorporates from T11 through upper S3 level. Alignment:  Slight curvature. Post L3-4 and L4-5 laminectomy. Discitis/ osteomyelitis L4-5 level with interval further destruction of the L4 and L5 vertebra. 50% loss of height L5 and 20% maximal loss of height of L4. Abscess within the L4-5  laminectomy site has enlarged measuring 2.3 x 1.9 x 1.1 cm versus prior 1 x 1.6 x 1.1 cm. Infectious process surrounds the thecal sac in a circumferential fashion extending from the upper L4 to the upper S1 level. This is most prominent in the ventral epidural space without central fluid containing collection as seen with epidural abscess. This narrows the thecal sac. Infectious process extends into the L4-5 and upper aspect of the L5-S1 neural foramen bilaterally. Paraspinal extension of infection into the psoas muscle bilaterally. This inflammatory process extends inferiorly along the psoas muscles and prevertebral region from L4 through lower sacrum. Paraspinal muscle infection L2-3 through L5-S1. Conus medullaris: Extends to the L1 level. Enhancement of the right L5 nerve root may be related to neuritis rather than arachnoiditis. 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 Facet degenerative changes. Small Schmorl's node deformity. L2-3: Facet bony overgrowth. Short pedicles. Mild slightly moderate spinal stenosis. L3-4:  Posterior decompression with superimposed infection. L4-5: Post decompression with superimposed infection as detailed above. L5-S1: Prominent facet degenerative changes and bony overgrowth. Bulge. Moderate bilateral foraminal narrowing. Lateral osteophyte greater on the right. IMPRESSION: Progressive discitis/osteomyelitis with further destruction of the L4 and L5 vertebral body. Increase in size of abscess within the L4-5 laminectomy site. Significant extension of inflammatory process as detailed above. Please see above for further detail. Electronically Signed   By: Genia Del M.D.   On: 07/20/2016 19:36        Scheduled Meds: . cefTRIAXone (ROCEPHIN)  IV  2 g Intravenous Q24H  . docusate sodium  100 mg Oral Daily  . multivitamin with minerals  1 tablet Oral Daily  . oxyCODONE  20 mg Oral Q12H  . sodium  chloride flush  3 mL Intravenous Q12H  . terbinafine  250 mg Oral Daily  . vancomycin  1,000 mg Intravenous Q12H   Continuous Infusions:   LOS: 1 day    Time spent: 30 minutes    Evens Meno Tanna Furry, MD [facility] Pager [phone]  If 7PM-7AM, please contact night-coverage [email or web address] Password [email or web address] 07/21/2016, 12:56 PM

## 2016-07-21 NOTE — Progress Notes (Signed)
Pharmacy Antibiotic Note  Donald [name] is a 75 y.o. male admitted on 07/20/2016 with lumbar back pain.  Pharmacy has been consulted for vancomycin dosing. Of note, he has been on vancomycin and ceftriaxone at home for discitis - stop date was to be 12/13. MRI shows discitis progression that will require further surgery. ID consulted. Renal function normal.  Vancomycin trough was 18 this morning on 1 g IV q12h.  Plan: Vancomycin 1000 mg IV every 8 hours.  Goal trough 15-20 mcg/mL.  Follow-up renal function, clinical progress, trough as clinically indicated     Temp (24hrs), Avg:98.4 F (36.9 C), Min:98.1 F (36.7 C), Max:98.7 F (37.1 C)   Recent Labs Lab 07/20/16 1509 07/20/16 1517 07/20/16 2145 07/21/16 0500 07/21/16 0930  WBC 6.5  --   --  5.9  --   CREATININE 0.75  --   --  0.80  --   LATICACIDVEN  --  0.56 0.77  --   --   VANCOTROUGH  --   --   --   --  18    Estimated Creatinine Clearance: 90.3 mL/min (by C-G formula based on SCr of 0.8 mg/dL).    Allergies  Allergen Reactions  . Iodine Anaphylaxis  . Ivp Dye [Iodinated Diagnostic Agents] Anaphylaxis    Antimicrobials this admission:  Vancomycin 10/19 >>  Ceftriaxone 10/20 >> 10/21, 10/23 >>   Dose adjustments this admission:  10/23 VT 12 on 750mg  q12, increase to 1g q12 11/10 VT 18 on 1g q12 - cont  Microbiology results:  10/19 Lumbar wound: Propionibacterium acnes  Thank you for allowing pharmacy to be a part of this patient's care.  Renold Genta, PharmD, BCPS Clinical Pharmacist Phone for today - [place] - [phone] 07/21/2016 1:39 PM

## 2016-07-21 NOTE — Consult Note (Signed)
Consult note dictated Job number [id number]

## 2016-07-21 NOTE — Progress Notes (Signed)
[facility]  Active pt with [facility] prior to this readmission  [facility] providing:  HHRN and Home Infusion Pharmacy services for home IV ABX. [facility] hospital team will follow pt until DC to support transition home to ensure [facility] needs are met.   If patient discharges after hours, please call [phone].   Larry Sierras 07/21/2016, 9:57 AM

## 2016-07-22 ENCOUNTER — Inpatient Hospital Stay (HOSPITAL_COMMUNITY): Payer: Medicare Other

## 2016-07-22 MED ORDER — HYDROMORPHONE HCL 2 MG/ML IJ SOLN
0.5000 mg | INTRAMUSCULAR | Status: DC | PRN
  Administered 2016-07-22: 1 mg via INTRAVENOUS
  Filled 2016-07-22: qty 1

## 2016-07-22 NOTE — Consult Note (Signed)
NAMEALFREDA, FAGIN NO.:  [id number]  MEDICAL RECORD NO.:  [id number]  LOCATION:  5N24C                        FACILITY:  [facility]  PHYSICIAN:  Kalib Bhagat D. Rolena Infante, M.D. DATE OF BIRTH:  April 23, 1941  DATE OF CONSULTATION: DATE OF DISCHARGE:                                CONSULTATION   REQUESTING PHYSICIAN:  Phylliss Bob, MD  HISTORY OF PRESENT ILLNESS:  This is a very pleasant 75 year old gentleman, who underwent a lumbar decompression in June of 2017 for lumbar spinal stenosis.  The patient initially did quite well.  He had an L3-4, L4-5 decompression done.  The patient subsequently developed increasing intractable back, buttock, bilateral leg pain, left side worse than the right.  The patient ultimately was seen in the emergency room, where an MRI was done in October of 2017, approximately 3 months out from his index surgery.  That MRI demonstrated osteomyelitis/diskitis with an epidural abscess at the L4-5 level.  At that time on June 29, 2016, he was taken back to the operating room by [name] and had a formal widening of revision decompression, and irrigation and washout of the abscess.  According to the patient and his wife, initially, he did well after that.  He was ultimately sent home with IV antibiotics.  However, he began having intractable recurrent back pain, and as a result, was brought back to the emergency room and admitted on July 20, 2016.  At this point in time, repeat MRI imaging demonstrated recurrence of the epidural abscess and worsening of the osteomyelitis with lytic destruction at L4 and L5.  It was at this time that [name] asked me to do a second opinion consultation.  Please refer to his initial H and P for details on the remainder of his past medical, surgical, family and social history.  I have reviewed those.  Clinical exam, he is a pleasant gentleman, appears his stated age, in no acute distress.  He is alert.   He is oriented x3.  He has no shortness of breath or chest pain at present.  He is lying in bed, comfortable. He has horrific pain when he attempts to rotate from side to side or sit up.  He has a negative Babinski test.  No clonus.  1+ deep tendon reflex is symmetrical.  He has diffuse diminished sensation to light touch, especially in the left lower extremity (no significant change from baseline).  He has no gross weakness of his EHL, tibialis anterior, gastrocnemius, although it is difficult to test as he does elicit pain. Hip flexor, quad, hamstring also seem to be intact on the left side, but again testing, it does elicit significant pain and causes inhibition. On the right, he has 5/5.  He does not demonstrate any significant hip, knee, or ankle pain with isolated joint range of motion.  Compartments are soft and nontender.  I have also reviewed the MRI and CT scan.  I agree with the radiologist. Concerning the CT scan, he does have significant lytic destruction of L4 and L5 consistent with a diskitis, osteomyelitis.  There is slight retrolisthesis of the canal with bi-foraminal impingement at L4-5.  The left side seems to be worse  than the right.  His MRI from July 20, 2016 was also reviewed.  Unfortunately, he does show the abscess at L4-5 enlarged compared to his previous MRI.  There is significant loss of the L4 and L5 vertebral body, more so of L5. There is also foraminal stenosis recurring at L4-5.  At this point in time, I had a long discussion with the patient and his wife.  I do agree that this is a significant situation.  He essentially had an infection that was appropriately I and D'd and then has recurred despite using IV antibiotics and surgical debridement and washout.  In order to address this at this point, would require a combination of an anterior decompressive procedure with reconstruction of the unstable 4-5 segment with supplemental posterior fixation.  I  have discussed this with the patient.  I did tell him that one encouraging note is that when he is at rest, lying down, there is no significant radicular leg pain. When he stands up, that is when he gets the radicular leg pain.  It is most likely from the collapse that occurs because of the instability. Therefore, I think if we were to restore the anterior column, that a posterior revision decompression is probably not going to be necessary and his leg pain will respond to stable anterior column.  I will discuss this in greater detail with [name] in the upcoming few days.     Melvia Matousek D. Rolena Infante, M.D.     DDB/MEDQ  D:  07/21/2016  T:  07/22/2016  Job:  [id number]  cc:   Duane Lope D. Rolena Infante, M.D. Phylliss Bob, MD

## 2016-07-22 NOTE — Progress Notes (Signed)
PROGRESS NOTE    Donald [name]  [id number] DOB: 11-Jul-1941 DOA: 07/20/2016 PCP: [name], MD   Brief Narrative: 75 year old Caucasian male past history significant for prostate cancer status post prostatectomy, and spinal stenosis status post L3/L4 and L4/L5 decompression on 720/2017 by [name]. 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 He was placed on vancomycin and ceftriaxone to complete an 8 week course of home antibiotics via  RUE PICC line, presented with worsening back pain. Imaging studies showed osteomyelitis of L4-L5 and epidural abscess. Orthopedics consult following.  Assessment & Plan:   # Osteomyelitis/discitis of L4-L5 and epidural abscess: -Orthopedics evaluation ongoing for possible surgical intervention. -Infectious disease consult appreciated. Continue IV vancomycin, ceftazidime and fluconazole. -Need surgical specimen sent out if patient goes to surgery. -Continue to provide supportive care.   #Pain management: Patient is on OxyContin 20 mg twice a day and Percocet as needed for the pain controlled. On Colace and MiraLAX for bowel regimen.  # Microcytic anemia: Hemoglobin around baseline. Has low iron stores, will start oral iron. Monitor labs.  DVT prophylaxis: SCDs. No anticoagulation because of possible surgical intervention, I will defer this to orthopedics team. Code Status: Full code Family Communication: No family present at bedside today.  Disposition Plan: Likely discharge to rehabilitation versus home with home care in 3-4 days.    Consultants:   Orthopedics and infectious disease  Procedures: None Antimicrobials: IV vancomycin and ceftazidime  Subjective: Patient was seen and examined at bedside. Patient reported no pain while lying still. The pain comes back when he tried to move. Denied fever, chills, headache, dizziness, nausea, vomiting, chest  pain or shortness of breath.   Objective: Vitals:   07/21/16 0415 07/21/16 1251 07/21/16 2214 07/22/16 0629  BP: (!) 148/80 121/68 124/81 124/76  Pulse: 70 68 63 60  Resp: 16 16 16 16   Temp: 98.7 F (37.1 C) 98.3 F (36.8 C) 98.2 F (36.8 C) 98 F (36.7 C)  TempSrc: Oral Oral Oral Oral  SpO2: 95% 96% 97% 95%    Intake/Output Summary (Last 24 hours) at 07/22/16 1057 Last data filed at 07/22/16 0800  Gross per 24 hour  Intake              600 ml  Output              200 ml  Net              400 ml   There were no vitals filed for this visit.  Examination:  General exam: Not on distress, lying comfortably on bed.  Respiratory system: Clear to auscultation bilateral, no wheezing or crackle. Cardiovascular system: Regular rate rhythm, S1-S2 normal. No pedal edema. Gastrointestinal system: Abdomen soft, nontender nondistended. Bowel sound positive. Central nervous system: Alert and oriented. No focal neurological deficits. Extremities: Muscle strength 5 over 5 in all extremities. Skin: No rashes, lesions or ulcers Psychiatry: Judgement and insight appear normal. Mood & affect appropriate.     Data Reviewed: I have personally reviewed following labs and imaging studies  CBC:  Recent Labs Lab 07/20/16 1509 07/21/16 0500  WBC 6.5 5.9  NEUTROABS 4.6  --   HGB 8.2* 8.1*  HCT 27.1* 26.4*  MCV 76.8* 76.7*  PLT 285 99991111   Basic Metabolic Panel:  Recent Labs Lab 07/20/16 1509 07/21/16 0500  NA 140 141  K 3.9 3.7  CL 107 110  CO2 22 26  GLUCOSE 93 93  BUN 11 8  CREATININE 0.75 0.80  CALCIUM 8.5* 8.3*   GFR: Estimated Creatinine Clearance: 90.3 mL/min (by C-G formula based on SCr of 0.8 mg/dL). Liver Function Tests:  Recent Labs Lab 07/20/16 1509 07/21/16 0500  AST 18 16  ALT 15* 14*  ALKPHOS 71 67  BILITOT 0.5 0.7  PROT 5.6* 5.7*  ALBUMIN 2.6* 2.4*   No results for input(s): LIPASE, AMYLASE in the last 168 hours. No results for input(s): AMMONIA in  the last 168 hours. Coagulation Profile:  Recent Labs Lab 07/21/16 0500  INR 1.11   Cardiac Enzymes: No results for input(s): CKTOTAL, CKMB, CKMBINDEX, TROPONINI in the last 168 hours. BNP (last 3 results) No results for input(s): PROBNP in the last 8760 hours. HbA1C: No results for input(s): HGBA1C in the last 72 hours. CBG: No results for input(s): GLUCAP in the last 168 hours. Lipid Profile: No results for input(s): CHOL, HDL, LDLCALC, TRIG, CHOLHDL, LDLDIRECT in the last 72 hours. Thyroid Function Tests: No results for input(s): TSH, T4TOTAL, FREET4, T3FREE, THYROIDAB in the last 72 hours. Anemia Panel:  Recent Labs  07/21/16 1321  FERRITIN 80  TIBC 237*  IRON 12*   Sepsis Labs:  Recent Labs Lab 07/20/16 1517 07/20/16 2145  LATICACIDVEN 0.56 0.77    Recent Results (from the past 240 hour(s))  Culture, blood (routine x 2)     Status: None (Preliminary result)   Collection Time: 07/20/16 11:53 PM  Result Value Ref Range Status   Specimen Description BLOOD LEFT ARM  Final   Special Requests BOTTLES DRAWN AEROBIC AND ANAEROBIC 6ML  Final   Culture NO GROWTH 1 DAY  Final   Report Status PENDING  Incomplete  Culture, blood (routine x 2)     Status: None (Preliminary result)   Collection Time: 07/20/16 11:59 PM  Result Value Ref Range Status   Specimen Description BLOOD LEFT HAND  Final   Special Requests BOTTLES DRAWN AEROBIC AND ANAEROBIC 6ML  Final   Culture NO GROWTH 1 DAY  Final   Report Status PENDING  Incomplete  MRSA PCR Screening     Status: None   Collection Time: 07/21/16  4:43 AM  Result Value Ref Range Status   MRSA by PCR NEGATIVE NEGATIVE Final    Comment:        The GeneXpert MRSA Assay (FDA approved for NASAL specimens only), is one component of a comprehensive MRSA colonization surveillance program. It is not intended to diagnose MRSA infection nor to guide or monitor treatment for MRSA infections.          Radiology  Studies: Ct Lumbar Spine Wo Contrast  Result Date: 07/21/2016 CLINICAL DATA:  Postoperative infection.  Lumbar surgery in July. EXAM: CT LUMBAR SPINE WITHOUT CONTRAST TECHNIQUE: Multidetector CT imaging of the lumbar spine was performed without intravenous contrast administration. Multiplanar CT image reconstructions were also generated. COMPARISON:  MRI from yesterday FINDINGS: Segmentation: Standard and as numbered yesterday. Alignment: L4-5 retrolisthesis. Vertebrae: Extensive irregular endplate erosion around the L4-5 disc with disc widening from abscess seen by MRI yesterday. Facet joints are widened at this level, without erosive changes. Laminectomy centered at L4 with diffuse soft tissue stranding, an abscess is noted in this region by MRI. No fracture. No underlying bone lesion. Paraspinal and other soft tissues: Extensive inflammation around the L4-5 discitis and L4-5 laminectomy abscess. Disc levels: T12- L1: Unremarkable. L1-L2: Degenerative disc narrowing and endplate ridging with facet hypertrophy. Subarticular recess narrowing. L2-L3: Advanced facet arthropathy with hypertrophy  and bilateral subarticular recess stenosis. Mild spondylosis. L3-L4: Advanced facet arthropathy with hypertrophy and bilateral subarticular recess narrowing. Spondylosis. L4-L5: Advanced facet arthropathy. Changes of decompression and infection described above. Advanced foraminal narrowing, especially on the right. L5-S1:Hyper trophic facet arthropathy.  Mild annulus bulging. IMPRESSION: Active L4-5 discitis with discal abscess. L4 laminectomy abscess not well-defined compared to MRI yesterday. L4-5 retrolisthesis with canal and biforaminal impingement. Electronically Signed   By: Monte Fantasia M.D.   On: 07/21/2016 09:21   Mr Lumbar Spine W Wo Contrast  Result Date: 07/20/2016 CLINICAL DATA:  75 year old male with chronic postoperative infection. Pain. Surgery 03/30/2016. EXAM: MRI LUMBAR SPINE WITHOUT AND WITH  CONTRAST TECHNIQUE: Multiplanar and multiecho pulse sequences of the lumbar spine were obtained without and with intravenous contrast. CONTRAST:  6mL MULTIHANCE GADOBENATE DIMEGLUMINE 529 MG/ML IV SOLN COMPARISON:  06/29/2016, 05/29/2016 and 12/25/2015 CT. FINDINGS: Segmentation: Last fully open disk space is labeled L5-S1. Present examination incorporates from T11 through upper S3 level. Alignment:  Slight curvature. Post L3-4 and L4-5 laminectomy. Discitis/ osteomyelitis L4-5 level with interval further destruction of the L4 and L5 vertebra. 50% loss of height L5 and 20% maximal loss of height of L4. Abscess within the L4-5 laminectomy site has enlarged measuring 2.3 x 1.9 x 1.1 cm versus prior 1 x 1.6 x 1.1 cm. Infectious process surrounds the thecal sac in a circumferential fashion extending from the upper L4 to the upper S1 level. This is most prominent in the ventral epidural space without central fluid containing collection as seen with epidural abscess. This narrows the thecal sac. Infectious process extends into the L4-5 and upper aspect of the L5-S1 neural foramen bilaterally. Paraspinal extension of infection into the psoas muscle bilaterally. This inflammatory process extends inferiorly along the psoas muscles and prevertebral region from L4 through lower sacrum. Paraspinal muscle infection L2-3 through L5-S1. Conus medullaris: Extends to the L1 level. Enhancement of the right L5 nerve root may be related to neuritis rather than arachnoiditis. 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 Facet degenerative changes. Small Schmorl's node deformity. L2-3: Facet bony overgrowth. Short pedicles. Mild slightly moderate spinal stenosis. L3-4:  Posterior decompression with superimposed infection. L4-5: Post decompression with superimposed infection as detailed above. L5-S1: Prominent facet degenerative changes and bony overgrowth. Bulge.  Moderate bilateral foraminal narrowing. Lateral osteophyte greater on the right. IMPRESSION: Progressive discitis/osteomyelitis with further destruction of the L4 and L5 vertebral body. Increase in size of abscess within the L4-5 laminectomy site. Significant extension of inflammatory process as detailed above. Please see above for further detail. Electronically Signed   By: Genia Del M.D.   On: 07/20/2016 19:36        Scheduled Meds: . cefTAZidime (FORTAZ)  IV  2 g Intravenous Q8H  . docusate sodium  100 mg Oral Daily  . fluconazole  100 mg Oral Daily  . multivitamin with minerals  1 tablet Oral Daily  . oxyCODONE  20 mg Oral Q12H  . polyethylene glycol  17 g Oral Daily  . sodium chloride flush  3 mL Intravenous Q12H  . vancomycin  1,000 mg Intravenous Q12H   Continuous Infusions:   LOS: 2 days    Time spent: 26 minutes    Justyn Langham Tanna Furry, MD [facility] Pager [phone]  If 7PM-7AM, please contact night-coverage [email or web address] Password [email or web address] 07/22/2016, 10:57 AM

## 2016-07-22 NOTE — Progress Notes (Signed)
PATIENT ID: Donald [name]  MRN: [id number]  DOB/AGE:  1940/09/13 / 75 y.o.    Procedure(s) (LRB): ANTERIOR LUMBAR FUSION AND POSTERIOR LUMBAR FUSION L4 - L5  1 LEVEL (N/A) POSTERIOR LUMBAR FUSION 1 LEVEL (N/A)    PROGRESS NOTE Subjective:   Patient is alert, oriented, no Nausea, no Vomiting, yes passing gas, no Bowel Movement. Taking PO with small bites. Denies SOB, Chest or Calf Pain. Using Incentive Spirometer, PAS in place. Ambulate WBAT though pt has significant pain with motion of the L-Spine, Patient reports pain as severe with movement.   Objective: Vital signs in last 24 hours: Temp:  [98 F (36.7 C)-98.3 F (36.8 C)] 98 F (36.7 C) (11/11 0629) Pulse Rate:  [60-68] 60 (11/11 0629) Resp:  [16] 16 (11/11 0629) BP: (121-124)/(68-81) 124/76 (11/11 0629) SpO2:  [95 %-97 %] 95 % (11/11 0629)    Intake/Output from previous day: I/O last 3 completed shifts: In: 350 [P.O.:150; IV Piggyback:200] Out: 450 [Urine:450]   Intake/Output this shift: No intake/output data recorded.   LABORATORY DATA:  Recent Labs  07/20/16 1509 07/21/16 0500  WBC 6.5 5.9  HGB 8.2* 8.1*  HCT 27.1* 26.4*  PLT 285 271  NA 140 141  K 3.9 3.7  CL 107 110  CO2 22 26  BUN 11 8  CREATININE 0.75 0.80  GLUCOSE 93 93  INR  --  1.11  CALCIUM 8.5* 8.3*    Examination: Neurologically intact Neurovascular intact Sensation intact distally Intact pulses distally Dorsiflexion/Plantar flexion intact}  Assessment:     Procedure(s) (LRB): ANTERIOR LUMBAR FUSION AND POSTERIOR LUMBAR FUSION L4 - L5  1 LEVEL (N/A) POSTERIOR LUMBAR FUSION 1 LEVEL (N/A) ADDITIONAL DIAGNOSIS:   Plan:  Pt has surgery scheduled with [name].  Pt has additional questions and concerns and [name] will be notified. Continue with IV antibiotics per ID.      Hula Tasso R 07/22/2016, 8:06 AM

## 2016-07-22 NOTE — Progress Notes (Signed)
[facility] for Infectious Disease  Date of Admission:  07/20/2016           Day 24 vancomycin        Day 2 ceftazidime  Active Problems:   Epidural abscess   Propionibacterium infection   Osteomyelitis of lumbar spine (HCC)   Status post lumbar laminectomy   Spinal stenosis of lumbar region   Microcytic anemia   Unintentional weight loss   Candidiasis   . cefTAZidime (FORTAZ)  IV  2 g Intravenous Q8H  . docusate sodium  100 mg Oral Daily  . fluconazole  100 mg Oral Daily  . multivitamin with minerals  1 tablet Oral Daily  . oxyCODONE  20 mg Oral Q12H  . polyethylene glycol  17 g Oral Daily  . sodium chloride flush  3 mL Intravenous Q12H  . vancomycin  1,000 mg Intravenous Q12H    SUBJECTIVE: He is comfortable as long as he is laying still in bed.  Review of Systems: Review of Systems  Constitutional: Positive for malaise/fatigue and weight loss. Negative for chills, diaphoresis and fever.  Musculoskeletal: Positive for back pain.  Neurological: Negative for focal weakness.    Past Medical History:  Diagnosis Date  . Adenomatous polyps 10/2002, 06/2009  . Arthritis   . BPH (benign prostatic hyperplasia)   . Hyperlipemia   . Prostate cancer ([place])   . Spinal stenosis   . Wears glasses     Social History  Substance Use Topics  . Smoking status: Former Smoker    Types: Cigars    Quit date: 09/12/1972  . Smokeless tobacco: Never Used  . Alcohol use 1.2 oz/week    2 Cans of beer per week     Comment: 1-2 beers a week    Family History  Problem Relation Age of Onset  . Diabetes Mother   . Heart disease Mother   . Prostate cancer Father   . Cancer Brother     prostate  . Colon cancer Neg Hx   . Stomach cancer Neg Hx    Allergies  Allergen Reactions  . Iodine Anaphylaxis  . Ivp Dye [Iodinated Diagnostic Agents] Anaphylaxis    OBJECTIVE: Vitals:   07/21/16 0415 07/21/16 1251 07/21/16 2214 07/22/16 0629  BP: (!) 148/80 121/68 124/81  124/76  Pulse: 70 68 63 60  Resp: 16 16 16 16   Temp: 98.7 F (37.1 C) 98.3 F (36.8 C) 98.2 F (36.8 C) 98 F (36.7 C)  TempSrc: Oral Oral Oral Oral  SpO2: 95% 96% 97% 95%   There is no height or weight on file to calculate BMI.  Physical Exam  Constitutional: He is oriented to person, place, and time.  He is resting comfortably in bed working on his laptop computer. His wife is at the bedside.  Cardiovascular: Normal rate and regular rhythm.   No murmur heard. Pulmonary/Chest: Effort normal and breath sounds normal.  Neurological: He is alert and oriented to person, place, and time.  Skin: No rash noted.  Psychiatric: Mood and affect normal.    Lab Results Lab Results  Component Value Date   WBC 5.9 07/21/2016   HGB 8.1 (L) 07/21/2016   HCT 26.4 (L) 07/21/2016   MCV 76.7 (L) 07/21/2016   PLT 271 07/21/2016    Lab Results  Component Value Date   CREATININE 0.80 07/21/2016   BUN 8 07/21/2016   NA 141 07/21/2016   K 3.7 07/21/2016   CL  110 07/21/2016   CO2 26 07/21/2016    Lab Results  Component Value Date   ALT 14 (L) 07/21/2016   AST 16 07/21/2016   ALKPHOS 67 07/21/2016   BILITOT 0.7 07/21/2016     Microbiology: Recent Results (from the past 240 hour(s))  Culture, blood (routine x 2)     Status: None (Preliminary result)   Collection Time: 07/20/16 11:53 PM  Result Value Ref Range Status   Specimen Description BLOOD LEFT ARM  Final   Special Requests BOTTLES DRAWN AEROBIC AND ANAEROBIC 6ML  Final   Culture NO GROWTH 1 DAY  Final   Report Status PENDING  Incomplete  Culture, blood (routine x 2)     Status: None (Preliminary result)   Collection Time: 07/20/16 11:59 PM  Result Value Ref Range Status   Specimen Description BLOOD LEFT HAND  Final   Special Requests BOTTLES DRAWN AEROBIC AND ANAEROBIC 6ML  Final   Culture NO GROWTH 1 DAY  Final   Report Status PENDING  Incomplete  MRSA PCR Screening     Status: None   Collection Time: 07/21/16  4:43 AM    Result Value Ref Range Status   MRSA by PCR NEGATIVE NEGATIVE Final    Comment:        The GeneXpert MRSA Assay (FDA approved for NASAL specimens only), is one component of a comprehensive MRSA colonization surveillance program. It is not intended to diagnose MRSA infection nor to guide or monitor treatment for MRSA infections.      ASSESSMENT: At this point it is not clear why he has worsening of his L4-5 infection after 3 weeks of IV vancomycin and ceftriaxone for postoperative Propionibacterium lumbar infection. I broadened his antibiotic therapy yesterday. I will continue that pending results of lumbar fusion Week. At the time of that surgery would like to have specimen submitted for pathology, routine and AFB stains and cultures.  PLAN: 1. Continue vancomycin and ceftazidime 2. Please call me for any infectious disease questions this weekend  Michel Bickers, MD [facility] for [place] [phone] pager   [phone] cell 07/22/2016, 12:17 PM

## 2016-07-23 ENCOUNTER — Encounter (HOSPITAL_COMMUNITY): Payer: Self-pay | Admitting: *Deleted

## 2016-07-23 DIAGNOSIS — D509 Iron deficiency anemia, unspecified: Secondary | ICD-10-CM

## 2016-07-23 LAB — SEDIMENTATION RATE: Sed Rate: 56 mm/hr — ABNORMAL HIGH (ref 0–16)

## 2016-07-23 LAB — SURGICAL PCR SCREEN
MRSA, PCR: NEGATIVE
Staphylococcus aureus: NEGATIVE

## 2016-07-23 LAB — C-REACTIVE PROTEIN: CRP: 9.4 mg/dL — ABNORMAL HIGH (ref <1.0)

## 2016-07-23 MED ORDER — FERROUS SULFATE 325 (65 FE) MG PO TABS
325.0000 mg | ORAL_TABLET | Freq: Two times a day (BID) | ORAL | Status: DC
  Administered 2016-07-23 – 2016-07-31 (×15): 325 mg via ORAL
  Filled 2016-07-23 (×17): qty 1

## 2016-07-23 MED ORDER — ENOXAPARIN SODIUM 40 MG/0.4ML ~~LOC~~ SOLN
40.0000 mg | SUBCUTANEOUS | Status: AC
  Filled 2016-07-23: qty 0.4

## 2016-07-23 NOTE — Progress Notes (Signed)
PROGRESS NOTE    Donald [name]  [id number] DOB: 23-Dec-1940 DOA: 07/20/2016 PCP: [name], MD   Brief Narrative: 75 year old Caucasian male past history significant for prostate cancer status post prostatectomy, and spinal stenosis status post L3/L4 and L4/L5 decompression on 720/2017 by [name]. 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 He was placed on vancomycin and ceftriaxone to complete an 8 week course of home antibiotics via  RUE PICC line, presented with worsening back pain. Imaging studies showed osteomyelitis of L4-L5 and epidural abscess. Orthopedics consult following.  Assessment & Plan:   # Osteomyelitis/discitis of L4-L5 and epidural abscess: -Orthopedics evaluation ongoing for possible surgical intervention. Likely surgical intervention on Tuesday.  -Infectious disease consult appreciated. Continue IV vancomycin, ceftazidime and fluconazole. -Need surgical specimen sent out for culture when patient goes to surgery. -Continue to provide supportive care.   #Pain management: Patient is on OxyContin 20 mg twice a day and Percocet as needed for the pain controlled. On Colace and MiraLAX for bowel regimen. Reported pain controlled with the current regimen especially while not moving.  # Microcytic anemia: Hemoglobin around baseline. Has low iron stores, will start oral iron. Monitor labs.  DVT prophylaxis: SCDs. I will start subcutaneous Lovenox today and tomorrow since patient likely go for surgery on Tuesday. Code Status: Full code Family Communication: Patient's wife at bedside. Disposition Plan: Likely discharge to rehabilitation versus home with home care in 3-4 days.    Consultants:   Orthopedics and infectious disease  Procedures: None Antimicrobials: IV vancomycin and ceftazidime  Subjective: Patient was seen and examined at bedside. No new event. Patient is lying  on bed. He reported pain controlled with the current regimen especially when he is lying on bed. Denied fever, chills, nausea, vomiting, chest pain, shortness of breath. Objective: Vitals:   07/22/16 0629 07/22/16 1323 07/22/16 2001 07/23/16 0703  BP: 124/76 123/69 137/83 125/73  Pulse: 60 90 65 61  Resp: 16 18    Temp: 98 F (36.7 C) 98.5 F (36.9 C) 98.9 F (37.2 C) 98.7 F (37.1 C)  TempSrc: Oral Oral Oral Oral  SpO2: 95% 100% 96% 95%    Intake/Output Summary (Last 24 hours) at 07/23/16 1122 Last data filed at 07/23/16 0300  Gross per 24 hour  Intake              462 ml  Output              900 ml  Net             -438 ml   There were no vitals filed for this visit.  Examination:  General exam: Not in distress, lying on bed comfortable. Respiratory system: Clear to auscultation bilateral, no wheezing or crackles. Cardiovascular system: Regular rate and rhythm, S1-S2 normal. No pedal edema. Gastrointestinal system: Abdomen soft, nontender, nondistended. Bowel sound positive. Central nervous system: Alert, awake, oriented. No focal neurological deficit. Extremities: Muscle strength 5 over 5 in all extremities. Skin: No rashes, lesions or ulcers Psychiatry: Judgement and insight appear normal. Mood & affect appropriate.     Data Reviewed: I have personally reviewed following labs and imaging studies  CBC:  Recent Labs Lab 07/20/16 1509 07/21/16 0500  WBC 6.5 5.9  NEUTROABS 4.6  --   HGB 8.2* 8.1*  HCT 27.1* 26.4*  MCV 76.8* 76.7*  PLT 285 99991111   Basic Metabolic Panel:  Recent Labs Lab 07/20/16 1509 07/21/16 0500  NA  140 141  K 3.9 3.7  CL 107 110  CO2 22 26  GLUCOSE 93 93  BUN 11 8  CREATININE 0.75 0.80  CALCIUM 8.5* 8.3*   GFR: Estimated Creatinine Clearance: 90.3 mL/min (by C-G formula based on SCr of 0.8 mg/dL). Liver Function Tests:  Recent Labs Lab 07/20/16 1509 07/21/16 0500  AST 18 16  ALT 15* 14*  ALKPHOS 71 67  BILITOT 0.5 0.7    PROT 5.6* 5.7*  ALBUMIN 2.6* 2.4*   No results for input(s): LIPASE, AMYLASE in the last 168 hours. No results for input(s): AMMONIA in the last 168 hours. Coagulation Profile:  Recent Labs Lab 07/21/16 0500  INR 1.11   Cardiac Enzymes: No results for input(s): CKTOTAL, CKMB, CKMBINDEX, TROPONINI in the last 168 hours. BNP (last 3 results) No results for input(s): PROBNP in the last 8760 hours. HbA1C: No results for input(s): HGBA1C in the last 72 hours. CBG: No results for input(s): GLUCAP in the last 168 hours. Lipid Profile: No results for input(s): CHOL, HDL, LDLCALC, TRIG, CHOLHDL, LDLDIRECT in the last 72 hours. Thyroid Function Tests: No results for input(s): TSH, T4TOTAL, FREET4, T3FREE, THYROIDAB in the last 72 hours. Anemia Panel:  Recent Labs  07/21/16 1321  FERRITIN 80  TIBC 237*  IRON 12*   Sepsis Labs:  Recent Labs Lab 07/20/16 1517 07/20/16 2145  LATICACIDVEN 0.56 0.77    Recent Results (from the past 240 hour(s))  Culture, blood (routine x 2)     Status: None (Preliminary result)   Collection Time: 07/20/16 11:53 PM  Result Value Ref Range Status   Specimen Description BLOOD LEFT ARM  Final   Special Requests BOTTLES DRAWN AEROBIC AND ANAEROBIC 6ML  Final   Culture NO GROWTH 1 DAY  Final   Report Status PENDING  Incomplete  Culture, blood (routine x 2)     Status: None (Preliminary result)   Collection Time: 07/20/16 11:59 PM  Result Value Ref Range Status   Specimen Description BLOOD LEFT HAND  Final   Special Requests BOTTLES DRAWN AEROBIC AND ANAEROBIC 6ML  Final   Culture NO GROWTH 1 DAY  Final   Report Status PENDING  Incomplete  MRSA PCR Screening     Status: None   Collection Time: 07/21/16  4:43 AM  Result Value Ref Range Status   MRSA by PCR NEGATIVE NEGATIVE Final    Comment:        The GeneXpert MRSA Assay (FDA approved for NASAL specimens only), is one component of a comprehensive MRSA colonization surveillance program.  It is not intended to diagnose MRSA infection nor to guide or monitor treatment for MRSA infections.   Surgical PCR screen     Status: None   Collection Time: 07/23/16 12:00 AM  Result Value Ref Range Status   MRSA, PCR NEGATIVE NEGATIVE Final   Staphylococcus aureus NEGATIVE NEGATIVE Final    Comment:        The Xpert SA Assay (FDA approved for NASAL specimens in patients over 61 years of age), is one component of a comprehensive surveillance program.  Test performance has been validated by [facility] for patients greater than or equal to 97 year old. It is not intended to diagnose infection nor to guide or monitor treatment.          Radiology Studies: Dg Lumbar Spine 1 View  Result Date: 07/22/2016 CLINICAL DATA:  L4-5 discitis/osteomyelitis EXAM: LUMBAR SPINE - 1 VIEW COMPARISON:  07/20/2016, 07/21/2016 FINDINGS: Normal alignment.  Osseous destruction at the L4-5 endplates compatible with known ongoing L4-5 discitis and osteomyelitis. Postop changes from previous spinal surgery at L3-L5. No focal kyphosis or severe compression fracture. No subluxation or dislocation. IMPRESSION: L4-5 discitis and osteomyelitis, better demonstrated by recent MR and CT. No gross malalignment. Electronically Signed   By: Jerilynn Mages.  Shick M.D.   On: 07/22/2016 11:31        Scheduled Meds: . cefTAZidime (FORTAZ)  IV  2 g Intravenous Q8H  . docusate sodium  100 mg Oral Daily  . fluconazole  100 mg Oral Daily  . multivitamin with minerals  1 tablet Oral Daily  . oxyCODONE  20 mg Oral Q12H  . polyethylene glycol  17 g Oral Daily  . sodium chloride flush  3 mL Intravenous Q12H  . vancomycin  1,000 mg Intravenous Q12H   Continuous Infusions:   LOS: 3 days    Time spent: 25 minutes    Marrietta Thunder Tanna Furry, MD [facility] Pager [phone]  If 7PM-7AM, please contact night-coverage [email or web address] Password [email or web address] 07/23/2016, 11:22 AM

## 2016-07-23 NOTE — Progress Notes (Signed)
Patient declined Lovenox at this time.  Education with teach back on medication's purpose provided.  Dr. Carolin Sicks notified.  Will continue to monitor.

## 2016-07-24 DIAGNOSIS — M4640 Discitis, unspecified, site unspecified: Secondary | ICD-10-CM

## 2016-07-24 LAB — BASIC METABOLIC PANEL
Anion gap: 8 (ref 5–15)
BUN: 10 mg/dL (ref 6–20)
CHLORIDE: 104 mmol/L (ref 101–111)
CO2: 28 mmol/L (ref 22–32)
Calcium: 8.6 mg/dL — ABNORMAL LOW (ref 8.9–10.3)
Creatinine, Ser: 0.89 mg/dL (ref 0.61–1.24)
GFR calc Af Amer: >60 mL/min (ref >60)
GFR calc non Af Amer: >60 mL/min (ref >60)
Glucose, Bld: 96 mg/dL (ref 65–99)
POTASSIUM: 4.1 mmol/L (ref 3.5–5.1)
SODIUM: 140 mmol/L (ref 135–145)

## 2016-07-24 MED ORDER — BISACODYL 10 MG RE SUPP
10.0000 mg | Freq: Once | RECTAL | Status: AC
  Administered 2016-07-24: 10 mg via RECTAL
  Filled 2016-07-24: qty 1

## 2016-07-24 MED ORDER — SENNOSIDES-DOCUSATE SODIUM 8.6-50 MG PO TABS
1.0000 | ORAL_TABLET | Freq: Two times a day (BID) | ORAL | Status: DC
  Administered 2016-07-24 – 2016-07-31 (×12): 1 via ORAL
  Filled 2016-07-24 (×15): qty 1

## 2016-07-24 MED ORDER — HYDROXYZINE HCL 25 MG PO TABS
25.0000 mg | ORAL_TABLET | Freq: Three times a day (TID) | ORAL | Status: DC | PRN
  Administered 2016-07-24: 25 mg via ORAL
  Filled 2016-07-24: qty 1

## 2016-07-24 MED ORDER — CHLORHEXIDINE GLUCONATE CLOTH 2 % EX PADS: 6.0000 | MEDICATED_PAD | Freq: Once | CUTANEOUS | Status: DC

## 2016-07-24 MED ORDER — DIPHENHYDRAMINE-ZINC ACETATE 2-0.1 % EX CREA
TOPICAL_CREAM | Freq: Every day | CUTANEOUS | Status: DC | PRN
  Administered 2016-07-24: 11:00:00 via TOPICAL
  Filled 2016-07-24: qty 28

## 2016-07-24 MED ORDER — CHLORHEXIDINE GLUCONATE CLOTH 2 % EX PADS
6.0000 | MEDICATED_PAD | Freq: Once | CUTANEOUS | Status: AC
  Administered 2016-07-24: 6 via TOPICAL

## 2016-07-24 NOTE — Progress Notes (Signed)
Of note, I have studied Donald [name]'s images and spoken with mulitple collegues regarding his situation over this past weekend. The current plan is to proceed with a posterior procedure on Tuesday at 1pm. Specifically, this will include an additional irrigation and debridement of his discitis and epidural abscess in combination with a posterior spinal fusion with instrumentation and allograft spanning L3 to the pelvis. I did discuss the details of this surgery with the patient over the weekend and he is on board with the plan above. Another consideration was to also debride anteriorly and reconstruct the anterior column, but there are certainly significant risks associated with an anterior approach, and a delayed anterior procedure can always be performed at a later date if indicated. Order was placed to make patient NPO after midnight tonight. I very much appreciate the assistance of the medical team and the ID team.

## 2016-07-24 NOTE — Progress Notes (Signed)
Pharmacy Antibiotic Note  Donald [name] is a 75 y.o. male admitted on 07/20/2016 with lumbar back pain.  Pt's problems stem from a lumbar decompression completed in June of this year. He has been on vancomycin and ceftriaxone at home for discitis - stop date was to be 12/13. MRI on admit shows discitis progression that will require further surgery - planned for tomorrow. His lumbar wound grew out propionibacterium, however his antibiotics were broadened over the weekend due to worsening infection. Currently afebrile, WBC wnl.   Plan: Vancomycin 1000 mg IV every 8 hours.  Goal trough 15-20 mcg/mL.  Follow-up renal function, clinical progress, trough as clinically indicated Continue ceftazidime 2g/8h    Temp (24hrs), Avg:98.6 F (37 C), Min:98.5 F (36.9 C), Max:98.8 F (37.1 C)   Recent Labs Lab 07/20/16 1509 07/20/16 1517 07/20/16 2145 07/21/16 0500 07/21/16 0930  WBC 6.5  --   --  5.9  --   CREATININE 0.75  --   --  0.80  --   LATICACIDVEN  --  0.56 0.77  --   --   VANCOTROUGH  --   --   --   --  18    Estimated Creatinine Clearance: 90.3 mL/min (by C-G formula based on SCr of 0.8 mg/dL).    Allergies  Allergen Reactions  . Iodine Anaphylaxis  . Ivp Dye [Iodinated Diagnostic Agents] Anaphylaxis    Antimicrobials this admission:  Vancomycin 10/19 >>  Ceftriaxone 10/20 >> 10/21, 11/10 x1 Ceftazidime 11/20 >> Fluconazole 11/10 >>   Dose adjustments this admission:  10/23 VT 12 on 750mg  q12, increase to 1g q12 11/10 VT 18 on 1g q12 - cont   Microbiology results:  10/19 Lumbar wound: Propionibacterium acnes 11/10 mrsa pcr: neg 11/9 blood cx: ngtd    Thank you for allowing pharmacy to be a part of this patient's care.    Hughes Better, PharmD, BCPS Clinical Pharmacist 07/24/2016 10:49 AM

## 2016-07-24 NOTE — Care Management Important Message (Signed)
Important Message  Patient Details  Name: Donald [name] MRN: [id number] Date of Birth: 1940/11/06   Medicare Important Message Given:  Yes    Arliss Hepburn 07/24/2016, 11:22 AM

## 2016-07-24 NOTE — Progress Notes (Signed)
PROGRESS NOTE    Donald [name]  [id number] DOB: [birth year] DOA: 07/20/2016   PCP: [name], MD  Brief Narrative: 75 year old Caucasian male past history significant for prostate cancer status post prostatectomy, and spinal stenosis status post L3/L4 and L4/L5 decompression on 720/2017 by [name]. 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 He was placed on vancomycin and ceftriaxone to complete an 8 week course of home antibiotics via  RUE PICC line, presented with worsening back pain. Imaging studies showed osteomyelitis of L4-L5 and epidural abscess. Orthopedics consult following.  Assessment & Plan:   # Osteomyelitis/discitis of L4-L5 and epidural abscess: -Orthopedics evaluation ongoing for possible surgical intervention. Likely surgical intervention on Tuesday.  -Infectious disease consult appreciated. Continue IV vancomycin, ceftazidime and fluconazole. -Need surgical specimen sent out for culture when patient goes to surgery. -Continue to provide supportive care.   #Pain management: Continue current pain medical regimen   # Microcytic anemia: Hemoglobin around baseline. Has low iron stores, will start oral iron. Monitor labs.  DVT prophylaxis: SCDs.. Code Status: Full code Family Communication: Patient's wife at bedside. Disposition Plan: Likely discharge to rehabilitation versus home with home care in 3-4 days.  Consultants:  Orthopedics and infectious disease  Procedures: None  Antimicrobials IV vancomycin and ceftazidime  Subjective:  Still with significant pain.   Objective: Vitals:   07/23/16 1340 07/23/16 2051 07/24/16 0405 07/24/16 1500  BP: 105/72 112/69 113/69 106/64  Pulse: 78 60 61 62  Resp: 17   18  Temp: 98.5 F (36.9 C) 98.8 F (37.1 C) 98.5 F (36.9 C) 99.2 F (37.3 C)  TempSrc: Oral Oral Oral   SpO2: 98% 94% 96% 95%    Intake/Output Summary  (Last 24 hours) at 07/24/16 1620 Last data filed at 07/24/16 1531  Gross per 24 hour  Intake          1665.83 ml  Output              200 ml  Net          1465.83 ml   There were no vitals filed for this visit.  Examination:  General exam: Not in distress, lying on bed comfortable. Respiratory system: Clear to auscultation bilateral, no wheezing or crackles. Cardiovascular system: Regular rate and rhythm, S1-S2 normal. No pedal edema. Gastrointestinal system: Abdomen soft, nontender, nondistended. Bowel sound positive. Central nervous system: Alert, awake, oriented. No focal neurological deficit. Extremities: Muscle strength 5 over 5 in all extremities. Skin: No rashes, lesions or ulcers Psychiatry: Judgement and insight appear normal. Mood & affect appropriate.     Data Reviewed: I have personally reviewed following labs and imaging studies  CBC:  Recent Labs Lab 07/20/16 1509 07/21/16 0500  WBC 6.5 5.9  NEUTROABS 4.6  --   HGB 8.2* 8.1*  HCT 27.1* 26.4*  MCV 76.8* 76.7*  PLT 285 99991111   Basic Metabolic Panel:  Recent Labs Lab 07/20/16 1509 07/21/16 0500 07/24/16 1155  NA 140 141 140  K 3.9 3.7 4.1  CL 107 110 104  CO2 22 26 28   GLUCOSE 93 93 96  BUN 11 8 10   CREATININE 0.75 0.80 0.89  CALCIUM 8.5* 8.3* 8.6*   Liver Function Tests:  Recent Labs Lab 07/20/16 1509 07/21/16 0500  AST 18 16  ALT 15* 14*  ALKPHOS 71 67  BILITOT 0.5 0.7  PROT 5.6* 5.7*  ALBUMIN 2.6* 2.4*   Coagulation Profile:  Recent Labs Lab 07/21/16  0500  INR 1.11   Sepsis Labs:  Recent Labs Lab 07/20/16 1517 07/20/16 2145  LATICACIDVEN 0.56 0.77   Recent Results (from the past 240 hour(s))  Culture, blood (routine x 2)     Status: None (Preliminary result)   Collection Time: 07/20/16 11:53 PM  Result Value Ref Range Status   Specimen Description BLOOD LEFT ARM  Final   Special Requests BOTTLES DRAWN AEROBIC AND ANAEROBIC 6ML  Final   Culture NO GROWTH 3 DAYS  Final     Report Status PENDING  Incomplete  Culture, blood (routine x 2)     Status: None (Preliminary result)   Collection Time: 07/20/16 11:59 PM  Result Value Ref Range Status   Specimen Description BLOOD LEFT HAND  Final   Special Requests BOTTLES DRAWN AEROBIC AND ANAEROBIC 6ML  Final   Culture NO GROWTH 3 DAYS  Final   Report Status PENDING  Incomplete  MRSA PCR Screening     Status: None   Collection Time: 07/21/16  4:43 AM  Result Value Ref Range Status   MRSA by PCR NEGATIVE NEGATIVE Final    Comment:        The GeneXpert MRSA Assay (FDA approved for NASAL specimens only), is one component of a comprehensive MRSA colonization surveillance program. It is not intended to diagnose MRSA infection nor to guide or monitor treatment for MRSA infections.   Surgical PCR screen     Status: None   Collection Time: 07/23/16 12:00 AM  Result Value Ref Range Status   MRSA, PCR NEGATIVE NEGATIVE Final   Staphylococcus aureus NEGATIVE NEGATIVE Final    Comment:        The Xpert SA Assay (FDA approved for NASAL specimens in patients over 24 years of age), is one component of a comprehensive surveillance program.  Test performance has been validated by [facility] for patients greater than or equal to 38 year old. It is not intended to diagnose infection nor to guide or monitor treatment.          Radiology Studies: No results found.      Scheduled Meds: . cefTAZidime (FORTAZ)  IV  2 g Intravenous Q8H  . enoxaparin (LOVENOX) injection  40 mg Subcutaneous Q24H  . ferrous sulfate  325 mg Oral BID WC  . fluconazole  100 mg Oral Daily  . multivitamin with minerals  1 tablet Oral Daily  . oxyCODONE  20 mg Oral Q12H  . polyethylene glycol  17 g Oral Daily  . senna-docusate  1 tablet Oral BID  . sodium chloride flush  3 mL Intravenous Q12H  . vancomycin  1,000 mg Intravenous Q12H   Continuous Infusions:   LOS: 4 days    Time spent: 25 minutes    Faye Ramsay, MD [facility] Pager [phone]  If 7PM-7AM, please contact night-coverage [email or web address] Password [email or web address] 07/24/2016, 4:20 PM

## 2016-07-24 NOTE — Progress Notes (Signed)
    [facility] for Infectious Disease   Reason for visit: Follow up on discitis  Interval History: on broad spectrum antibiotics  Physical Exam: Constitutional:  Vitals:   07/24/16 0405 07/24/16 1500  BP: 113/69 106/64  Pulse: 61 62  Resp:  18  Temp: 98.5 F (36.9 C) 99.2 F (37.3 C)   patient appears in NAD  Impression: discitis  Plan: 1. Cultures with surgery including bacterial, AFB stains and cultures, pathology.

## 2016-07-25 ENCOUNTER — Encounter (HOSPITAL_COMMUNITY)
Admission: EM | Disposition: A | Discharge status: Other Acute Inpatient Hospital | Payer: Self-pay | Source: Home / Self Care | Attending: Family Medicine

## 2016-07-25 ENCOUNTER — Inpatient Hospital Stay (HOSPITAL_COMMUNITY): Payer: Medicare Other | Admitting: Certified Registered Nurse Anesthetist

## 2016-07-25 ENCOUNTER — Encounter (HOSPITAL_COMMUNITY): Payer: Self-pay | Admitting: Certified Registered Nurse Anesthetist

## 2016-07-25 ENCOUNTER — Inpatient Hospital Stay (HOSPITAL_COMMUNITY): Payer: Medicare Other

## 2016-07-25 HISTORY — PX: INCISION AND DRAINAGE OF WOUND: SHX1803

## 2016-07-25 LAB — PREPARE RBC (CROSSMATCH)

## 2016-07-25 LAB — POCT I-STAT 4, (NA,K, GLUC, HGB,HCT)
GLUCOSE: 191 mg/dL — AB (ref 65–99)
Glucose, Bld: 157 mg/dL — ABNORMAL HIGH (ref 65–99)
HCT: 32 % — ABNORMAL LOW (ref 39.0–52.0)
HEMATOCRIT: 30 % — AB (ref 39.0–52.0)
HEMOGLOBIN: 10.2 g/dL — AB (ref 13.0–17.0)
Hemoglobin: 10.9 g/dL — ABNORMAL LOW (ref 13.0–17.0)
POTASSIUM: 4.3 mmol/L (ref 3.5–5.1)
Potassium: 4.2 mmol/L (ref 3.5–5.1)
SODIUM: 140 mmol/L (ref 135–145)
Sodium: 139 mmol/L (ref 135–145)

## 2016-07-25 SURGERY — POSTERIOR LUMBAR FUSION 3 LEVEL
Anesthesia: General | Site: Back

## 2016-07-25 MED ORDER — FENTANYL CITRATE (PF) 100 MCG/2ML IJ SOLN
INTRAMUSCULAR | Status: AC
  Filled 2016-07-25: qty 2

## 2016-07-25 MED ORDER — SODIUM CHLORIDE 0.9 % IR SOLN
Status: DC | PRN
  Administered 2016-07-25: 1000 mL

## 2016-07-25 MED ORDER — LACTATED RINGERS IV SOLN
INTRAVENOUS | Status: DC
  Administered 2016-07-25 (×5): via INTRAVENOUS

## 2016-07-25 MED ORDER — ROCURONIUM BROMIDE 100 MG/10ML IV SOLN
INTRAVENOUS | Status: DC | PRN
  Administered 2016-07-25: 50 mg via INTRAVENOUS
  Administered 2016-07-25: 40 mg via INTRAVENOUS

## 2016-07-25 MED ORDER — SODIUM CHLORIDE 0.9 % IV SOLN
INTRAVENOUS | Status: DC | PRN
  Administered 2016-07-25: 17:00:00 via INTRAVENOUS

## 2016-07-25 MED ORDER — THROMBIN 20000 UNITS EX KIT
PACK | CUTANEOUS | Status: DC | PRN
  Administered 2016-07-25: 20 mL via TOPICAL

## 2016-07-25 MED ORDER — THROMBIN 20000 UNITS EX SOLR
CUTANEOUS | Status: AC
  Filled 2016-07-25: qty 20000

## 2016-07-25 MED ORDER — LIDOCAINE HCL (CARDIAC) 20 MG/ML IV SOLN
INTRAVENOUS | Status: DC | PRN
  Administered 2016-07-25: 50 mg via INTRAVENOUS

## 2016-07-25 MED ORDER — HYDROMORPHONE HCL 1 MG/ML IJ SOLN
0.2500 mg | INTRAMUSCULAR | Status: DC | PRN
  Administered 2016-07-26 (×2): 0.5 mg via INTRAVENOUS

## 2016-07-25 MED ORDER — ONDANSETRON HCL 4 MG/2ML IJ SOLN
INTRAMUSCULAR | Status: DC | PRN
  Administered 2016-07-25: 4 mg via INTRAVENOUS

## 2016-07-25 MED ORDER — SUCCINYLCHOLINE CHLORIDE 20 MG/ML IJ SOLN
INTRAMUSCULAR | Status: DC | PRN
  Administered 2016-07-25: 100 mg via INTRAVENOUS

## 2016-07-25 MED ORDER — PROPOFOL 500 MG/50ML IV EMUL
INTRAVENOUS | Status: DC | PRN
  Administered 2016-07-25: 100 ug/kg/min via INTRAVENOUS

## 2016-07-25 MED ORDER — PROPOFOL 1000 MG/100ML IV EMUL
INTRAVENOUS | Status: AC
  Filled 2016-07-25: qty 200

## 2016-07-25 MED ORDER — SODIUM CHLORIDE 0.9 % IV SOLN: Freq: Once | INTRAVENOUS | Status: DC

## 2016-07-25 MED ORDER — PHENYLEPHRINE 40 MCG/ML (10ML) SYRINGE FOR IV PUSH (FOR BLOOD PRESSURE SUPPORT)
PREFILLED_SYRINGE | INTRAVENOUS | Status: AC
  Filled 2016-07-25: qty 10

## 2016-07-25 MED ORDER — FUROSEMIDE 10 MG/ML IJ SOLN
INTRAMUSCULAR | Status: AC
  Filled 2016-07-25: qty 4

## 2016-07-25 MED ORDER — ONDANSETRON HCL 4 MG/2ML IJ SOLN
INTRAMUSCULAR | Status: AC
  Filled 2016-07-25: qty 2

## 2016-07-25 MED ORDER — DEXAMETHASONE SODIUM PHOSPHATE 10 MG/ML IJ SOLN
INTRAMUSCULAR | Status: AC
  Filled 2016-07-25: qty 1

## 2016-07-25 MED ORDER — DEXTROSE 5 % IV SOLN
INTRAVENOUS | Status: DC | PRN
  Administered 2016-07-25: 20 ug/min via INTRAVENOUS

## 2016-07-25 MED ORDER — THROMBIN 20000 UNITS EX SOLR
CUTANEOUS | Status: DC | PRN
  Administered 2016-07-25: 20000 [IU] via TOPICAL

## 2016-07-25 MED ORDER — VANCOMYCIN HCL 1000 MG IV SOLR
INTRAVENOUS | Status: DC | PRN
  Administered 2016-07-25: 1 g via TOPICAL

## 2016-07-25 MED ORDER — ARTIFICIAL TEARS OP OINT
TOPICAL_OINTMENT | OPHTHALMIC | Status: DC | PRN
  Administered 2016-07-25: 1 via OPHTHALMIC

## 2016-07-25 MED ORDER — PROPOFOL 10 MG/ML IV BOLUS
INTRAVENOUS | Status: DC | PRN
  Administered 2016-07-25: 120 mg via INTRAVENOUS

## 2016-07-25 MED ORDER — LIDOCAINE-EPINEPHRINE 1 %-1:100000 IJ SOLN
INTRAMUSCULAR | Status: AC
  Filled 2016-07-25: qty 1

## 2016-07-25 MED ORDER — SODIUM CHLORIDE 0.9 % IR SOLN
Status: DC | PRN
  Administered 2016-07-25 (×2): 500 mL

## 2016-07-25 MED ORDER — SODIUM CHLORIDE 0.9 % IR SOLN
Status: DC | PRN
Start: 2016-07-25 — End: 2016-07-25
  Administered 2016-07-25 (×2): 3000 mL

## 2016-07-25 MED ORDER — PHENYLEPHRINE HCL 10 MG/ML IJ SOLN
INTRAMUSCULAR | Status: DC | PRN
  Administered 2016-07-25 (×2): 120 ug via INTRAVENOUS
  Administered 2016-07-25 (×3): 80 ug via INTRAVENOUS

## 2016-07-25 MED ORDER — DEXAMETHASONE SODIUM PHOSPHATE 10 MG/ML IJ SOLN
INTRAMUSCULAR | Status: DC | PRN
  Administered 2016-07-25 (×2): 10 mg via INTRAVENOUS

## 2016-07-25 MED ORDER — FUROSEMIDE 10 MG/ML IJ SOLN
INTRAMUSCULAR | Status: DC | PRN
  Administered 2016-07-25: 10 mg via INTRAMUSCULAR

## 2016-07-25 MED ORDER — LIDOCAINE-EPINEPHRINE 1 %-1:100000 IJ SOLN
INTRAMUSCULAR | Status: DC | PRN
  Administered 2016-07-25: 4 mL

## 2016-07-25 MED ORDER — 0.9 % SODIUM CHLORIDE (POUR BTL) OPTIME
TOPICAL | Status: DC | PRN
  Administered 2016-07-25 (×4): 1000 mL

## 2016-07-25 MED ORDER — PROMETHAZINE HCL 25 MG/ML IJ SOLN: 6.2500 mg | INTRAMUSCULAR | Status: DC | PRN

## 2016-07-25 MED ORDER — FENTANYL CITRATE (PF) 100 MCG/2ML IJ SOLN
INTRAMUSCULAR | Status: DC | PRN
  Administered 2016-07-25: 50 ug via INTRAVENOUS
  Administered 2016-07-25: 100 ug via INTRAVENOUS
  Administered 2016-07-25: 25 ug via INTRAVENOUS
  Administered 2016-07-25: 50 ug via INTRAVENOUS
  Administered 2016-07-25: 100 ug via INTRAVENOUS
  Administered 2016-07-25: 25 ug via INTRAVENOUS

## 2016-07-25 MED ORDER — MEPERIDINE HCL 25 MG/ML IJ SOLN: 6.2500 mg | INTRAMUSCULAR | Status: DC | PRN

## 2016-07-25 MED ORDER — EPHEDRINE SULFATE 50 MG/ML IJ SOLN
INTRAMUSCULAR | Status: DC | PRN
  Administered 2016-07-25: 10 mg via INTRAVENOUS

## 2016-07-25 MED ORDER — PROPOFOL 10 MG/ML IV BOLUS
INTRAVENOUS | Status: AC
  Filled 2016-07-25: qty 20

## 2016-07-25 MED ORDER — ALBUMIN HUMAN 5 % IV SOLN
INTRAVENOUS | Status: DC | PRN
  Administered 2016-07-25: 20:00:00 via INTRAVENOUS

## 2016-07-25 MED ORDER — METHYLENE BLUE 0.5 % INJ SOLN
INTRAVENOUS | Status: AC
  Filled 2016-07-25: qty 10

## 2016-07-25 MED ORDER — GLYCOPYRROLATE 0.2 MG/ML IJ SOLN
INTRAMUSCULAR | Status: DC | PRN
  Administered 2016-07-25: 0.2 mg via INTRAVENOUS

## 2016-07-25 MED ORDER — MIDAZOLAM HCL 2 MG/2ML IJ SOLN
INTRAMUSCULAR | Status: AC
  Filled 2016-07-25: qty 2

## 2016-07-25 MED ORDER — LACTATED RINGERS IV SOLN
INTRAVENOUS | Status: DC | PRN
  Administered 2016-07-25: 19:00:00 via INTRAVENOUS

## 2016-07-25 MED ORDER — VANCOMYCIN HCL 1000 MG IV SOLR
INTRAVENOUS | Status: AC
  Filled 2016-07-25: qty 1000

## 2016-07-25 SURGICAL SUPPLY — 98 items
BENZOIN TINCTURE PRP APPL 2/3 (GAUZE/BANDAGES/DRESSINGS) ×2 IMPLANT
BLADE SURG ROTATE 9660 (MISCELLANEOUS) ×2 IMPLANT
BONE MATRIX VIVIGEN 16.CC/XL (Bone Implant) ×4 IMPLANT
BUR PRESCISION 1.7 ELITE (BURR) ×2 IMPLANT
BUR ROUND PRECISION 4.0 (BURR) ×4 IMPLANT
BUR SABER RD CUTTING 3.0 (BURR) IMPLANT
CANISTER SUCT 3000ML (MISCELLANEOUS) ×6 IMPLANT
CARTRIDGE OIL MAESTRO DRILL (MISCELLANEOUS) ×1 IMPLANT
CLSR STERI-STRIP ANTIMIC 1/2X4 (GAUZE/BANDAGES/DRESSINGS) ×2 IMPLANT
CONNECTOR EXPEDIUM SFX SZ A6 (Orthopedic Implant) ×4 IMPLANT
CONT SPECI 4OZ STER CLIK (MISCELLANEOUS) ×4 IMPLANT
COVER SURGICAL LIGHT HANDLE (MISCELLANEOUS) ×2 IMPLANT
DIFFUSER DRILL AIR PNEUMATIC (MISCELLANEOUS) ×2 IMPLANT
DRAIN CHANNEL 15F RND FF W/TCR (WOUND CARE) ×2 IMPLANT
DRAPE C-ARM 42X72 X-RAY (DRAPES) ×2 IMPLANT
DRAPE C-ARMOR (DRAPES) ×2 IMPLANT
DRAPE ORTHO SPLIT 77X108 STRL (DRAPES)
DRAPE POUCH INSTRU U-SHP 10X18 (DRAPES) ×2 IMPLANT
DRAPE SURG 17X23 STRL (DRAPES) ×8 IMPLANT
DRAPE SURG ORHT 6 SPLT 77X108 (DRAPES) IMPLANT
DRSG MEPILEX BORDER 4X12 (GAUZE/BANDAGES/DRESSINGS) IMPLANT
DRSG MEPILEX BORDER 4X8 (GAUZE/BANDAGES/DRESSINGS) ×2 IMPLANT
DURAPREP 26ML APPLICATOR (WOUND CARE) ×2 IMPLANT
ELECT BLADE 4.0 EZ CLEAN MEGAD (MISCELLANEOUS) ×2
ELECT CAUTERY BLADE 6.4 (BLADE) ×2 IMPLANT
ELECT REM PT RETURN 9FT ADLT (ELECTROSURGICAL) ×2
ELECTRODE BLDE 4.0 EZ CLN MEGD (MISCELLANEOUS) ×1 IMPLANT
ELECTRODE REM PT RTRN 9FT ADLT (ELECTROSURGICAL) ×1 IMPLANT
EVACUATOR SILICONE 100CC (DRAIN) ×2 IMPLANT
FEE INTRAOP MONITOR IMPULS NCS (MISCELLANEOUS) ×1 IMPLANT
FILTER STRAW FLUID ASPIR (MISCELLANEOUS) ×2 IMPLANT
GAUZE SPONGE 4X4 16PLY XRAY LF (GAUZE/BANDAGES/DRESSINGS) ×8 IMPLANT
GLOVE BIO SURGEON STRL SZ 6.5 (GLOVE) ×2 IMPLANT
GLOVE BIO SURGEON STRL SZ7 (GLOVE) ×10 IMPLANT
GLOVE BIO SURGEON STRL SZ8 (GLOVE) ×10 IMPLANT
GLOVE BIOGEL PI IND STRL 7.0 (GLOVE) ×2 IMPLANT
GLOVE BIOGEL PI IND STRL 8 (GLOVE) ×2 IMPLANT
GLOVE BIOGEL PI INDICATOR 7.0 (GLOVE) ×2
GLOVE BIOGEL PI INDICATOR 8 (GLOVE) ×2
GOWN STRL REUS W/ TWL LRG LVL3 (GOWN DISPOSABLE) ×2 IMPLANT
GOWN STRL REUS W/ TWL XL LVL3 (GOWN DISPOSABLE) ×1 IMPLANT
GOWN STRL REUS W/TWL LRG LVL3 (GOWN DISPOSABLE) ×2
GOWN STRL REUS W/TWL XL LVL3 (GOWN DISPOSABLE) ×1
HANDPIECE INTERPULSE COAX TIP (DISPOSABLE) ×1
INTRAOP MONITOR FEE IMPULS NCS (MISCELLANEOUS) ×1
INTRAOP MONITOR FEE IMPULSE (MISCELLANEOUS) ×1
IV CATH 14GX2 1/4 (CATHETERS) ×2 IMPLANT
KIT BASIN OR (CUSTOM PROCEDURE TRAY) ×2 IMPLANT
KIT POSITION SURG JACKSON T1 (MISCELLANEOUS) ×2 IMPLANT
KIT ROOM TURNOVER OR (KITS) ×2 IMPLANT
MANIFOLD NEPTUNE II (INSTRUMENTS) ×2 IMPLANT
MARKER SKIN DUAL TIP RULER LAB (MISCELLANEOUS) ×2 IMPLANT
NEEDLE HYPO 25GX1X1/2 BEV (NEEDLE) ×2 IMPLANT
NEEDLE SPNL 18GX3.5 QUINCKE PK (NEEDLE) ×4 IMPLANT
NS IRRIG 1000ML POUR BTL (IV SOLUTION) ×2 IMPLANT
OIL CARTRIDGE MAESTRO DRILL (MISCELLANEOUS) ×2
PACK LAMINECTOMY ORTHO (CUSTOM PROCEDURE TRAY) ×2 IMPLANT
PACK UNIVERSAL I (CUSTOM PROCEDURE TRAY) ×2 IMPLANT
PAD ARMBOARD 7.5X6 YLW CONV (MISCELLANEOUS) ×4 IMPLANT
PATTIES SURGICAL .5 X1 (DISPOSABLE) ×2 IMPLANT
PATTIES SURGICAL .5 X3 (DISPOSABLE) ×2 IMPLANT
PATTIES SURGICAL .5X1.5 (GAUZE/BANDAGES/DRESSINGS) ×2 IMPLANT
ROD EXPEDIUM 140MM (Rod) ×4 IMPLANT
SCREW EXPEDIUM POLYAXIAL 6X45M (Screw) ×4 IMPLANT
SCREW ILIAC EXPEDIUM 8X80MM (Screw) ×4 IMPLANT
SCREW POLY EXPEDIUM 5.5 8X45 (Screw) ×4 IMPLANT
SCREW POLYAXIAL 7X45MM (Screw) ×4 IMPLANT
SCREW SET SINGLE INNER (Screw) ×16 IMPLANT
SET HNDPC FAN SPRY TIP SCT (DISPOSABLE) ×1 IMPLANT
SPONGE INTESTINAL PEANUT (DISPOSABLE) ×4 IMPLANT
SPONGE SURGIFOAM ABS GEL 100 (HEMOSTASIS) ×2 IMPLANT
STRIP CLOSURE SKIN 1/2X4 (GAUZE/BANDAGES/DRESSINGS) IMPLANT
STYLET INTUB SATIN SLIP 14FR (MISCELLANEOUS) IMPLANT
SURGIFLO W/THROMBIN 8M KIT (HEMOSTASIS) IMPLANT
SUT BONE WAX W31G (SUTURE) ×4 IMPLANT
SUT ETHILON 2 0 FS 18 (SUTURE) ×2 IMPLANT
SUT MNCRL AB 4-0 PS2 18 (SUTURE) ×4 IMPLANT
SUT PDS AB 1 CT  36 (SUTURE) ×3
SUT PDS AB 1 CT 36 (SUTURE) ×3 IMPLANT
SUT PDS AB 2-0 CT1 27 (SUTURE) ×8 IMPLANT
SUT VIC AB 0 CT1 18XCR BRD 8 (SUTURE) ×2 IMPLANT
SUT VIC AB 0 CT1 8-18 (SUTURE) ×2
SUT VIC AB 1 CT1 18XCR BRD 8 (SUTURE) ×2 IMPLANT
SUT VIC AB 1 CT1 8-18 (SUTURE) ×2
SUT VIC AB 2-0 CT2 18 VCP726D (SUTURE) ×4 IMPLANT
SWAB COLLECTION DEVICE MRSA (MISCELLANEOUS) ×2 IMPLANT
SWAB CULTURE LIQ STUART DBL (MISCELLANEOUS) ×2 IMPLANT
SYR 20CC LL (SYRINGE) ×2 IMPLANT
SYR BULB IRRIGATION 50ML (SYRINGE) ×2 IMPLANT
SYR CONTROL 10ML LL (SYRINGE) ×4 IMPLANT
SYR TB 1ML LUER SLIP (SYRINGE) ×2 IMPLANT
TIP IRRIGATION SMALL (MISCELLANEOUS) ×2 IMPLANT
TOWEL OR 17X24 6PK STRL BLUE (TOWEL DISPOSABLE) ×2 IMPLANT
TOWEL OR 17X26 10 PK STRL BLUE (TOWEL DISPOSABLE) ×2 IMPLANT
TRAY FOLEY CATH 16FRSI W/METER (SET/KITS/TRAYS/PACK) ×2 IMPLANT
TUBE CONNECTING 12X1/4 (SUCTIONS) ×2 IMPLANT
WATER STERILE IRR 1000ML POUR (IV SOLUTION) ×2 IMPLANT
YANKAUER SUCT BULB TIP NO VENT (SUCTIONS) ×6 IMPLANT

## 2016-07-25 NOTE — Anesthesia Procedure Notes (Signed)
Procedure Name: Intubation Date/Time: 07/25/2016 2:06 PM Performed by: Rejeana Brock L Pre-anesthesia Checklist: Patient identified, Emergency Drugs available, Suction available and Patient being monitored Patient Re-evaluated:Patient Re-evaluated prior to inductionOxygen Delivery Method: Circle System Utilized Preoxygenation: Pre-oxygenation with 100% oxygen Intubation Type: IV induction Ventilation: Mask ventilation without difficulty Laryngoscope Size: Glidescope and 4 Grade View: Grade I Tube type: Oral Tube size: 6.5 mm Number of attempts: 1 Airway Equipment and Method: Stylet,  Oral airway and Rigid stylet Placement Confirmation: ETT inserted through vocal cords under direct vision,  positive ETCO2 and breath sounds checked- equal and bilateral Secured at: 21 cm Tube secured with: Tape Dental Injury: Teeth and Oropharynx as per pre-operative assessment

## 2016-07-25 NOTE — H&P (Signed)
PREOPERATIVE H&P  Chief Complaint: Left leg pain and lumbar infection  HPI: Donald [name] is a 75 y.o. male who presents with ongoing pain in the left leg. Patient did develop L4/5 discitis,which was irrigated and debrided 3 weeks ago. He's been on abx per ID, but his infection recurred. After considering various options we did elect to proceed with a repear I and D with stabilization form L3-S1  (see office notes for additional details regarding the patient's full course of treatment)  Past Medical History:  Diagnosis Date  . Adenomatous polyps 10/2002, 06/2009  . Arthritis   . BPH (benign prostatic hyperplasia)   . Hyperlipemia   . Prostate cancer ([place])   . Spinal stenosis   . Wears glasses    Past Surgical History:  Procedure Laterality Date  . ANTERIOR CERVICAL DECOMP/DISCECTOMY FUSION N/A 09/18/2013   Procedure: Anterior cervical decompression fusion, cervical 4-5, cervical 5-6, cervical 6-7 with instrumentation and allograft;  Surgeon: Sinclair Ship, MD;  Location: [facility];  Service: Orthopedics;  Laterality: N/A;  Anterior cervical decompression fusion, cervical 4-5, cervical 5-6, cervical 6-7 with instrumentation and allograft  . COLONOSCOPY    . COLONOSCOPY W/ POLYPECTOMY  11/04/2002   Sigmoid adenomas x2, 1 cm maximum  . COLONOSCOPY W/ POLYPECTOMY  06/08/2009   6 polyps, largest 8 mm, at least 3 adenomas  . CYSTOSCOPY  04/2010  . ESOPHAGOGASTRODUODENOSCOPY  11/04/2002   Duodenitis, gastritis without H. pylori   . HIP ARTHROPLASTY  04/2010   Left - Dr. Mayer Camel, right 2012  . JOINT REPLACEMENT    . LUMBAR LAMINECTOMY/DECOMPRESSION MICRODISCECTOMY N/A 03/30/2016   Procedure: LUMBAR 3-4, LUMBAR 4-5 DECOMPRESSION ;  Surgeon: Phylliss Bob, MD;  Location: [facility];  Service: Orthopedics;  Laterality: N/A;  LUMBAR 3-4, LUMBAR 4-5 DECOMPRESSION   . LUMBAR WOUND DEBRIDEMENT N/A 06/29/2016   Procedure: LUMBAR WOUND EXPLORATION AND IRRIGATION AND  DEBRIDEMENT;  Surgeon:  Phylliss Bob, MD;  Location: [facility];  Service: Orthopedics;  Laterality: N/A;  . PROSTATE SURGERY    . RETROPUBIC PROSTATECTOMY  09/2003  . TONSILLECTOMY    . UPPER GASTROINTESTINAL ENDOSCOPY     Social History   Social History  . Marital status: Married    Spouse name: N/A  . Number of children: N/A  . Years of education: N/A   Occupational History  . Retired     [name]   Social History Main Topics  . Smoking status: Former Smoker    Types: Cigars    Quit date: 09/12/1972  . Smokeless tobacco: Never Used  . Alcohol use 1.2 oz/week    2 Cans of beer per week     Comment: 1-2 beers a week  . Drug use: No  . Sexual activity: Yes     Comment: number of sex partners in the last months 1, current birth control method - no prostate   Other Topics Concern  . None   Social History Narrative   Exercise do Tai Chi daily and walking   Married   Education: Theme park manager; Yes   Family History  Problem Relation Age of Onset  . Diabetes Mother   . Heart disease Mother   . Prostate cancer Father   . Cancer Brother     prostate  . Colon cancer Neg Hx   . Stomach cancer Neg Hx    Allergies  Allergen Reactions  . Iodine Anaphylaxis  . Ivp Dye [Iodinated Diagnostic Agents] Anaphylaxis   Prior  to Admission medications   Medication Sig Start Date End Date Taking? Authorizing Provider  cefTRIAXone 2 g in dextrose 5 % 50 mL Inject 2 g into the vein daily. End date 08/23/2016 07/03/16  Yes Costin Karlyne Greenspan, MD  clotrimazole (LOTRIMIN) 1 % cream Apply 1 application topically 2 (two) times daily. 07/17/16  Yes [name], MD  docusate sodium (COLACE) 100 MG capsule Take 100 mg by mouth daily.   Yes Historical Provider, MD  HYDROcodone-acetaminophen (NORCO) 5-325 MG tablet Take 1-2 tablets by mouth every 6 (six) hours as needed for moderate pain. 07/03/16  Yes Phylliss Bob, MD  methocarbamol (ROBAXIN) 500 MG tablet Take 500 mg by mouth 3 (three) times daily.    Yes  Historical Provider, MD  Multiple Vitamin (MULTIVITAMIN) capsule Take 1 capsule by mouth daily.     Yes Historical Provider, MD  oxyCODONE-acetaminophen (PERCOCET/ROXICET) 5-325 MG tablet Take 1-2 tablets by mouth every 6 (six) hours as needed for severe pain.   Yes Historical Provider, MD  terbinafine (LAMISIL) 250 MG tablet Take 1 tablet (250 mg total) by mouth daily. 07/17/16  Yes [name], MD  vancomycin (VANCOCIN) 1-5 GM/200ML-% SOLN Inject 200 mLs (1,000 mg total) into the vein every 12 (twelve) hours. Up until 08/23/2016 07/03/16  Yes Costin Karlyne Greenspan, MD  zolpidem (AMBIEN) 10 MG tablet TAKE 1/2 TABLET AT BEDTIME AS NEEDED FOR SLEEP. 05/16/16  Yes [name], MD     All other systems have been reviewed and were otherwise negative with the exception of those mentioned in the HPI and as above.  Physical Exam: Vitals:   07/24/16 2030 07/25/16 0500  BP: 122/70 136/82  Pulse: 63 62  Resp: 17 16  Temp: 99 F (37.2 C) 98.3 F (36.8 C)    General: Patient uncomfortable Cardiovascular: No pedal edema Respiratory: No cyanosis, no use of accessory musculature Skin: No lesions in the area of chief complaint Neurologic: Sensation intact distally Psychiatric: Patient is competent for consent with normal mood and affect Lymphatic: No axillary or cervical lymphadenopathy  MUSCULOSKELETAL: + SLR on the left.   Assessment/Plan: L4/5 discitis with osteomyelitis with recurrent abscess  Plan for Procedure(s): REPEAT AND I AND WITH POSTERIOR LUMBAR FUSION L3 - S1 3 LEVEL WITH INSTRUMENTATION AND ALLOGRAFT   Sinclair Ship, MD 07/25/2016 8:28 AM

## 2016-07-25 NOTE — Anesthesia Preprocedure Evaluation (Signed)
Anesthesia Evaluation  Patient identified by MRN, date of birth, ID band Patient awake    Reviewed: Allergy & Precautions, H&P , NPO status , Patient's Chart, lab work & pertinent test results  History of Anesthesia Complications Negative for: history of anesthetic complications  Airway Mallampati: II  TM Distance: >3 FB Neck ROM: full    Dental no notable dental hx. 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 Abx S/P Lumbar Laminectomy   Abdominal   Peds  Hematology  (+) anemia ,   Anesthesia Other Findings   Reproductive/Obstetrics negative OB ROS                             Lab Results  Component Value Date   WBC 5.9 07/21/2016   HGB 8.1 (L) 07/21/2016   HCT 26.4 (L) 07/21/2016   MCV 76.7 (L) 07/21/2016   PLT 271 07/21/2016     Chemistry      Component Value Date/Time   NA 140 07/24/2016 1155   K 4.1 07/24/2016 1155   CL 104 07/24/2016 1155   CO2 28 07/24/2016 1155   BUN 10 07/24/2016 1155   CREATININE 0.89 07/24/2016 1155   CREATININE 1.03 01/07/2016 0825      Component Value Date/Time   CALCIUM 8.6 (L) 07/24/2016 1155   ALKPHOS 67 07/21/2016 0500   AST 16 07/21/2016 0500   ALT 14 (L) 07/21/2016 0500   BILITOT 0.7 07/21/2016 0500     EKG: sinus bradycardia, LVH by voltage, non specific IVCD.  Anesthesia Physical  Anesthesia Plan  ASA: II  Anesthesia Plan: General   Post-op Pain  Management:    Induction: Intravenous  Airway Management Planned: Oral ETT  Additional Equipment:   Intra-op Plan:   Post-operative Plan: Extubation in OR  Informed Consent: I have reviewed the patients History and Physical, chart, labs and discussed the procedure including the risks, benefits and alternatives for the proposed anesthesia with the patient or authorized representative who has indicated his/her understanding and acceptance.   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

## 2016-07-25 NOTE — Transfer of Care (Signed)
Immediate Anesthesia Transfer of Care Note  Patient: Donald [name]  Procedure(s) Performed: Procedure(s): POSTERIOR LUMBAR FUSION L3 - S1 3 LEVEL WITH INSTRUMENTATION AND ALLOGRAFT (N/A) IRRIGATION AND DEBRIDEMENT LUMBAR DISCITIS (N/A)  Patient Location: PACU  Anesthesia Type:General  Level of Consciousness: responds to stimulation  Airway & Oxygen Therapy: Patient Spontanous Breathing and Patient connected to face mask oxygen  Post-op Assessment: Report given to RN and Post -op Vital signs reviewed and stable  Post vital signs: Reviewed and stable  Last Vitals:  Vitals:   07/25/16 0500 07/25/16 2329  BP: 136/82   Pulse: 62   Resp: 16   Temp: 36.8 C (P) 36.7 C    Last Pain:  Vitals:   07/25/16 0852  TempSrc:   PainSc: 4       Patients Stated Pain Goal: 2 ([id number])  Complications: No apparent anesthesia complications

## 2016-07-25 NOTE — Progress Notes (Signed)
PROGRESS NOTE    Donald [name]  [id number] DOB: [birth year] DOA: 07/20/2016   PCP: [name], MD  Brief Narrative: 75 year old Caucasian male past history significant for prostate cancer status post prostatectomy, and spinal stenosis status post L3/L4 and L4/L5 decompression on 720/2017 by [name]. 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 He was placed on vancomycin and ceftriaxone to complete an 8 week course of home antibiotics via  RUE PICC line, presented with worsening back pain. Imaging studies showed osteomyelitis of L4-L5 and epidural abscess. Orthopedics consult following.  Assessment & Plan:   Osteomyelitis/discitis of L4-L5 and epidural abscess - L4/5 discitis with osteomyelitis with recurrent abscess - plan for REPEAT I&D WITH POSTERIOR LUMBAR FUSION L3 - S1 3 LEVEL WITH INSTRUMENTATION AND ALLOGRAFT - Infectious disease consult appreciated. Continue IV vancomycin, ceftazidime and fluconazole. ABX management per ID.  - Need surgical specimen sent out for culture when patient goes to surgery. - Continue to provide supportive care.  Pain management - Continue current pain medical regimen   Microcytic anemia - Hemoglobin around baseline. Has low iron stores, iron supplementation started - CBC in AM  Opioid induced constipation - had BM yesterday AM - monitor   DVT prophylaxis: Lovenox SQ Code Status: Full code Family Communication: Patient's wife at bedside. Disposition Plan: Likely discharge to rehabilitation versus home with home care depending on clinical status post surgical intervention   Consultants:  Orthopedics and infectious disease  Procedures: None to this day   Antimicrobials IV vancomycin and ceftazidime 11/09 -->  Subjective:  Still with significant pain.   Objective: Vitals:   07/24/16 0405 07/24/16 1500 07/24/16 2030 07/25/16 0500  BP: 113/69  106/64 122/70 136/82  Pulse: 61 62 63 62  Resp:  18 17 16   Temp: 98.5 F (36.9 C) 99.2 F (37.3 C) 99 F (37.2 C) 98.3 F (36.8 C)  TempSrc: Oral  Oral Oral  SpO2: 96% 95% 98% 94%    Intake/Output Summary (Last 24 hours) at 07/25/16 0934 Last data filed at 07/25/16 0800  Gross per 24 hour  Intake          1685.66 ml  Output              600 ml  Net          1085.66 ml   There were no vitals filed for this visit.  Examination:  General exam: Not in distress, lying on bed comfortable. Respiratory system: Clear to auscultation bilateral, no wheezing or crackles. Cardiovascular system: Regular rate and rhythm, S1-S2 normal. No pedal edema. Gastrointestinal system: Abdomen soft, nontender, nondistended. Bowel sound positive. Central nervous system: Alert, awake, oriented. No focal neurological deficit. Extremities: Muscle strength 5 over 5 in all extremities. Skin: No rashes, lesions or ulcers Psychiatry: Judgement and insight appear normal. Mood & affect appropriate.     Data Reviewed: I have personally reviewed following labs and imaging studies  CBC:  Recent Labs Lab 07/20/16 1509 07/21/16 0500  WBC 6.5 5.9  NEUTROABS 4.6  --   HGB 8.2* 8.1*  HCT 27.1* 26.4*  MCV 76.8* 76.7*  PLT 285 [id number]   Basic Metabolic Panel:  Recent Labs Lab 07/20/16 1509 07/21/16 0500 07/24/16 1155  NA 140 141 140  K 3.9 3.7 4.1  CL 107 110 104  CO2 22 26 28   GLUCOSE 93 93 96  BUN 11 8 10   CREATININE 0.75 0.80 0.89  CALCIUM 8.5* 8.3* 8.6*  Liver Function Tests:  Recent Labs Lab 07/20/16 1509 07/21/16 0500  AST 18 16  ALT 15* 14*  ALKPHOS 71 67  BILITOT 0.5 0.7  PROT 5.6* 5.7*  ALBUMIN 2.6* 2.4*   Coagulation Profile:  Recent Labs Lab 07/21/16 0500  INR 1.11   Sepsis Labs:  Recent Labs Lab 07/20/16 1517 07/20/16 2145  LATICACIDVEN 0.56 0.77   Recent Results (from the past 240 hour(s))  Culture, blood (routine x 2)     Status: None (Preliminary result)    Collection Time: 07/20/16 11:53 PM  Result Value Ref Range Status   Specimen Description BLOOD LEFT ARM  Final   Special Requests BOTTLES DRAWN AEROBIC AND ANAEROBIC 6ML  Final   Culture NO GROWTH 3 DAYS  Final   Report Status PENDING  Incomplete  Culture, blood (routine x 2)     Status: None (Preliminary result)   Collection Time: 07/20/16 11:59 PM  Result Value Ref Range Status   Specimen Description BLOOD LEFT HAND  Final   Special Requests BOTTLES DRAWN AEROBIC AND ANAEROBIC 6ML  Final   Culture NO GROWTH 3 DAYS  Final   Report Status PENDING  Incomplete  MRSA PCR Screening     Status: None   Collection Time: 07/21/16  4:43 AM  Result Value Ref Range Status   MRSA by PCR NEGATIVE NEGATIVE Final    Comment:        The GeneXpert MRSA Assay (FDA approved for NASAL specimens only), is one component of a comprehensive MRSA colonization surveillance program. It is not intended to diagnose MRSA infection nor to guide or monitor treatment for MRSA infections.   Surgical PCR screen     Status: None   Collection Time: 07/23/16 12:00 AM  Result Value Ref Range Status   MRSA, PCR NEGATIVE NEGATIVE Final   Staphylococcus aureus NEGATIVE NEGATIVE Final    Comment:        The Xpert SA Assay (FDA approved for NASAL specimens in patients over 78 years of age), is one component of a comprehensive surveillance program.  Test performance has been validated by [facility] for patients greater than or equal to 32 year old. It is not intended to diagnose infection nor to guide or monitor treatment.          Radiology Studies: No results found.      Scheduled Meds: . cefTAZidime (FORTAZ)  IV  2 g Intravenous Q8H  . Chlorhexidine Gluconate Cloth  6 each Topical Once  . enoxaparin (LOVENOX) injection  40 mg Subcutaneous Q24H  . ferrous sulfate  325 mg Oral BID WC  . fluconazole  100 mg Oral Daily  . multivitamin with minerals  1 tablet Oral Daily  . oxyCODONE  20 mg Oral  Q12H  . polyethylene glycol  17 g Oral Daily  . senna-docusate  1 tablet Oral BID  . sodium chloride flush  3 mL Intravenous Q12H  . vancomycin  1,000 mg Intravenous Q12H   Continuous Infusions:   LOS: 5 days    Time spent: 25 minutes    Faye Ramsay, MD [facility] Pager [phone]  If 7PM-7AM, please contact night-coverage [email or web address] Password [facility] 07/25/2016, 9:34 AM

## 2016-07-26 DIAGNOSIS — Z9889 Other specified postprocedural states: Secondary | ICD-10-CM

## 2016-07-26 DIAGNOSIS — G061 Intraspinal abscess and granuloma: Secondary | ICD-10-CM

## 2016-07-26 DIAGNOSIS — B9689 Other specified bacterial agents as the cause of diseases classified elsewhere: Secondary | ICD-10-CM

## 2016-07-26 DIAGNOSIS — M4646 Discitis, unspecified, lumbar region: Secondary | ICD-10-CM

## 2016-07-26 DIAGNOSIS — R634 Abnormal weight loss: Secondary | ICD-10-CM

## 2016-07-26 DIAGNOSIS — G062 Extradural and subdural abscess, unspecified: Secondary | ICD-10-CM

## 2016-07-26 DIAGNOSIS — B379 Candidiasis, unspecified: Secondary | ICD-10-CM

## 2016-07-26 DIAGNOSIS — M4626 Osteomyelitis of vertebra, lumbar region: Principal | ICD-10-CM

## 2016-07-26 LAB — CBC
HEMATOCRIT: 29.6 % — AB (ref 39.0–52.0)
Hemoglobin: 9.2 g/dL — ABNORMAL LOW (ref 13.0–17.0)
MCH: 24 pg — AB (ref 26.0–34.0)
MCHC: 31.1 g/dL (ref 30.0–36.0)
MCV: 77.1 fL — AB (ref 78.0–100.0)
PLATELETS: 303 10*3/uL (ref 150–400)
RBC: 3.84 MIL/uL — ABNORMAL LOW (ref 4.22–5.81)
RDW: 15.9 % — AB (ref 11.5–15.5)
WBC: 11.9 10*3/uL — AB (ref 4.0–10.5)

## 2016-07-26 LAB — BASIC METABOLIC PANEL
ANION GAP: 9 (ref 5–15)
BUN: 10 mg/dL (ref 6–20)
CALCIUM: 8.4 mg/dL — AB (ref 8.9–10.3)
CO2: 25 mmol/L (ref 22–32)
CREATININE: 0.78 mg/dL (ref 0.61–1.24)
Chloride: 103 mmol/L (ref 101–111)
GFR calc Af Amer: >60 mL/min (ref >60)
GLUCOSE: 160 mg/dL — AB (ref 65–99)
Potassium: 4.3 mmol/L (ref 3.5–5.1)
Sodium: 137 mmol/L (ref 135–145)

## 2016-07-26 LAB — CULTURE, BLOOD (ROUTINE X 2)
CULTURE: NO GROWTH
Culture: NO GROWTH

## 2016-07-26 MED ORDER — HYDROMORPHONE HCL 2 MG/ML IJ SOLN
INTRAMUSCULAR | Status: AC
  Filled 2016-07-26: qty 1

## 2016-07-26 MED ORDER — ALUM & MAG HYDROXIDE-SIMETH 200-200-20 MG/5ML PO SUSP
30.0000 mL | Freq: Four times a day (QID) | ORAL | Status: DC | PRN
Start: 2016-07-26 — End: 2016-07-31

## 2016-07-26 MED ORDER — ONDANSETRON HCL 4 MG/2ML IJ SOLN: 4.0000 mg | INTRAMUSCULAR | Status: DC | PRN

## 2016-07-26 MED ORDER — PHENOL 1.4 % MT LIQD: 1.0000 | OROMUCOSAL | Status: DC | PRN

## 2016-07-26 MED ORDER — SODIUM CHLORIDE 0.9 % IV SOLN
INTRAVENOUS | Status: DC
  Administered 2016-07-26 – 2016-07-31 (×5): via INTRAVENOUS

## 2016-07-26 MED ORDER — HYDROMORPHONE HCL 1 MG/ML IJ SOLN
INTRAMUSCULAR | Status: AC
  Filled 2016-07-26: qty 0.5

## 2016-07-26 MED ORDER — SODIUM CHLORIDE 0.9% FLUSH: 3.0000 mL | INTRAVENOUS | Status: DC | PRN

## 2016-07-26 MED ORDER — MENTHOL 3 MG MT LOZG: 1.0000 | LOZENGE | OROMUCOSAL | Status: DC | PRN

## 2016-07-26 MED ORDER — SODIUM CHLORIDE 0.9% FLUSH
3.0000 mL | Freq: Two times a day (BID) | INTRAVENOUS | Status: DC
  Administered 2016-07-26 – 2016-07-30 (×5): 3 mL via INTRAVENOUS

## 2016-07-26 MED ORDER — ACETAMINOPHEN 650 MG RE SUPP: 650.0000 mg | RECTAL | Status: DC | PRN

## 2016-07-26 MED ORDER — SODIUM CHLORIDE 0.9 % IV SOLN: 250.0000 mL | INTRAVENOUS | Status: DC

## 2016-07-26 MED ORDER — ORAL CARE MOUTH RINSE
15.0000 mL | Freq: Two times a day (BID) | OROMUCOSAL | Status: DC
  Administered 2016-07-28 – 2016-07-31 (×6): 15 mL via OROMUCOSAL

## 2016-07-26 MED ORDER — ACETAMINOPHEN 325 MG PO TABS
650.0000 mg | ORAL_TABLET | ORAL | Status: DC | PRN
  Administered 2016-07-26: 650 mg via ORAL
  Filled 2016-07-26: qty 2

## 2016-07-26 MED ORDER — MORPHINE SULFATE (PF) 2 MG/ML IV SOLN
1.0000 mg | INTRAVENOUS | Status: DC | PRN
  Administered 2016-07-26: 2 mg via INTRAVENOUS
  Filled 2016-07-26: qty 1

## 2016-07-26 NOTE — Progress Notes (Signed)
    Patient doing well Patient denies leg pain + LBP, as expected Has not been OOB Due to be fitted for brace today   Physical Exam: Vitals:   07/26/16 0108 07/26/16 0430  BP: 130/70 117/63  Pulse: 79 77  Resp: 14 15  Temp: 97.3 F (36.3 C) 97.9 F (36.6 C)   Patient appear comfortable, looks good Dressing in place NVI  POD #1 s/p I and D and fusion L3-S1  - up with PT/OT, encourage ambulation - need to get OOB to chair this AM with assistance. I have spoken to his nurse about this. - Percocet for pain, Valium for muscle spasms - brace fitting to happen today - he has another brace that he can wear until his custom brace is ready - Abx management her ID (multiple infected tissue fragments sent to pathology for further evaluation yesterday) - d/c home once pain controlled and once abx plan is arranged

## 2016-07-26 NOTE — Anesthesia Postprocedure Evaluation (Signed)
Anesthesia Post Note  Patient: MAREO BROWNLOW  Procedure(s) Performed: Procedure(s) (LRB): POSTERIOR LUMBAR FUSION L3 - S1 3 LEVEL WITH INSTRUMENTATION AND ALLOGRAFT (N/A) IRRIGATION AND DEBRIDEMENT LUMBAR DISCITIS (N/A)  Patient location during evaluation: PACU Anesthesia Type: General Level of consciousness: awake and alert Pain management: pain level controlled Vital Signs Assessment: post-procedure vital signs reviewed and stable Respiratory status: spontaneous breathing, nonlabored ventilation, respiratory function stable and patient connected to nasal cannula oxygen Cardiovascular status: blood pressure returned to baseline and stable Postop Assessment: no signs of nausea or vomiting Anesthetic complications: no    Last Vitals:  Vitals:   07/26/16 0045 07/26/16 0108  BP: 137/85 130/70  Pulse: 82 79  Resp: 11 14  Temp:  36.3 C    Last Pain:  Vitals:   07/26/16 0139  TempSrc:   PainSc: 4                  Marrio Scribner S

## 2016-07-26 NOTE — Progress Notes (Signed)
Orthopedic Tech Progress Note Patient Details:  Donald [name] May 13, 1941 [id number]  Patient ID: [name], male   DOB: [birth year], 75 y.o.   MRN: [id number]   [name] 07/26/2016, 8:28 AM Called in bio-tech brace order; spoke with [facility]

## 2016-07-26 NOTE — Care Management Note (Signed)
Case Management Note  Patient Details  Name: RIGSBY PEREGRINO MRN: [id number] Date of Birth: 09/08/1941  Subjective/Objective:    75 yr old gentleman admitted with epidural abscess, underwent debridement, revision decompression and fusion L4-S1.                Action/Plan:      Expected Discharge Date:                  Expected Discharge Plan:  [place]  In-House Referral:  NA  Discharge planning Services  CM Consult  Post Acute Care Choice:  Resumption of Svcs/PTA Provider, Home Health Choice offered to:     DME Arranged:  N/A DME Agency:     HH Arranged:  PT HH Agency:  [place]  Status of Service:  In process, will continue to follow  If discussed at Long Length of Stay Meetings, dates discussed:    Additional Comments:  Ninfa Meeker, RN 07/26/2016, 3:28 PM

## 2016-07-26 NOTE — Op Note (Signed)
NAME:  Donald [name] NO.:  [id number]  MEDICAL RECORD NO.:  [id number]  LOCATION:  [facility]                         FACILITY:  [place]  PHYSICIAN:  Phylliss Bob, MD      DATE OF BIRTH:  06-Jan-1941  DATE OF PROCEDURE:  07/25/2016                              OPERATIVE REPORT   PREOPERATIVE DIAGNOSES: 1. 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 2. Severe left-sided lumbar radiculopathy.  POSTOPERATIVE DIAGNOSES: 1. 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 2. Severe left-sided lumbar radiculopathy.  PROCEDURE: 1. Revision decompression, L4-5, including debridement of epidural     abscess and debridement of significant L4-5 intervertebral infected     disk material and bone, requiring additional laminectomy. 2. Posterior spinal fusion L3-4, L4-5, L5-S1. 3. Placement of posterior segmental instrumentation L3, L4, S1 with     extension to the pelvis (S2 alar-iliac screws). 4. Use of morselized allograft-ViviGen. 5. Intraoperative use of fluoroscopy.  SURGEON:  Phylliss Bob, M.D.  ASSISTANT:  Pricilla Holm, PAC.  ANESTHESIA:  General endotracheal anesthesia.  COMPLICATIONS:  None.  DISPOSITION:  Stable.  ESTIMATED BLOOD LOSS:  400 mL.  INDICATIONS FOR SURGERY:  Briefly, [name] is a very pleasant 75- year-old male who is status post a decompression, which was complicated by postoperative diskitis.  He did undergo an irrigation and debridement for this, however, he was noted to have recurrence of his infection, resulting in an epidural abscess and substantial left leg pain.  An additional MRI was diagnostic for recurrence of his epidural abscess and infection.  Given his severe and rather intractable pain, we did discuss proceeding with the procedure reflected above.  The patient was fully aware of the risks and limitations of surgery and did elect  to proceed.  OPERATIVE DETAILS:  On July 25, 2016, the patient was brought to surgery and general endotracheal anesthesia was administered.  The patient was placed prone on a well-padded flat Jackson bed.  Antibiotics that he was previously getting were continued.  The back was prepped and draped and a time-out procedure was performed.  A midline incision was then made in line with the patient's previous incision.  The fascia was incised at the midline.  I then dissected down to the region of the epidural abscess.  There was gray purulent material encountered at the region just above the L5 lamina.  This was irrigated and debrided throughly.  I was able to meticulously develop a plane between the dura and the previous laminectomy site at the L4-5 region.  Additional laminectomy was required at L4/5.  I was able to retract the traversing right L5 nerve medially and the intervertebral disk space was encountered.  Upon entering the space, there was noted to be extensive infected and necrotic material, which was removed in multiple pieces using a pituitary rongeur.  Multiple intervertebral disk fragments were also removed, as it was felt that these were the nidus of the infection. Additional necrotic material including a partial corpectomy at the L4 and L5 vertebral bodies was removed.  The intervertebral space was then irrigated significantly with pulsatile irrigation.  In total,  I did irrigate the intervertebral space itself with a total of approximately 3 L of normal saline.  I then used pulsatile irrigation to irrigate the remainder of the wound, including the regions of the L3-S1 lamina and posterior elements.  All necrotic appearing tissue was removed as well. I was very pleased with the irrigation and the decompression that I was able to accomplish.  I then exposed in a subperiosteal fashion the L3-4, L4-5, and L5-S1 facet joints.  The transverse processes from L3-S1 were also  subperiosteally exposed.  Using anatomic landmarks in addition to an intraoperative fluoroscopy, I did cannulate the L3, L4, and S1 pedicles on both the right and left sides.  Bone wax was placed in the cannulated pedicles.  I then cannulated the pelvis.  Specifically, an entry hole was made at the S2 region.  I then advanced an awl across the S2 ala, across the sacroiliac joint, and into the ilium.  This was done bilaterally.  I did liberally use multiple fluoroscopic images in order to ensure safe trajectory of the S2 iliac screw.  At this point, I did decorticate the posterior elements and transverse processes at the levels to be fused.  I then packed a total of 30 mL of ViviGen (DBX mix with live cells) into the posterolateral gutters onto the right and left sides.  I then placed screws of the appropriate length at L3, L4, S1, as well as into the S2 ala and across the sacroiliac joint and into the ileum.  I was very pleased with the press-fit of each of the screws. The appropriate sized rod was then selected and secured into the tulip heads of the screws.  Caps were placed and a final locking procedure was performed.  I was very pleased with the final AP and lateral fluoroscopic images.  I then again irrigated the L4-5 intervertebral space with a significant amount of pulsatile irrigation.  Of note, the purulent material as well as multiple intervertebral fragments of disk and infected appearing material was sent to Pathology for cultures and microscopy.  I then proceeded with closure.  The wound was closed in layers using #1 PDS, followed by 2-0 PDS, followed by 4-0 Monocryl. Prior to this, a #15 deep Blake drain was placed.  I did also place vancomycin powder into the intervertebral space and along the laminectomy site.  All instrument counts were correct at the termination of the procedure.  The patient was then awakened from general endotracheal anesthesia and transferred to  recovery in stable condition.  Of note, Pricilla Holm was my assistant throughout the entire surgery from start to finish and did aid in retraction, suctioning, and closure.     Phylliss Bob, MD     MD/MEDQ  D:  07/25/2016  T:  07/26/2016  Job:  [id number]

## 2016-07-26 NOTE — Progress Notes (Signed)
[facility] Infectious [facility]   Reason for visit: Follow up on discitis with osteomyelitis and abscess, L4-5  Interval History: s/p OR debridement yesterday, revision decompression, debridement, fusion  Physical Exam: Constitutional:  Vitals:   07/26/16 0430 07/26/16 1500  BP: 117/63 104/62  Pulse: 77 72  Resp: 15 16  Temp: 97.9 F (36.6 C) 98.6 F (37 C)   patient appears in NAD Respiratory: Normal respiratory effort; CTA B Cardiovascular: RRR GI: soft, nt  Review of Systems: Constitutional: negative for fevers and chills Gastrointestinal: negative for diarrhea  Lab Results  Component Value Date   WBC 11.9 (H) 07/26/2016   HGB 9.2 (L) 07/26/2016   HCT 29.6 (L) 07/26/2016   MCV 77.1 (L) 07/26/2016   PLT 303 07/26/2016    Lab Results  Component Value Date   CREATININE 0.78 07/26/2016   BUN 10 07/26/2016   NA 137 07/26/2016   K 4.3 07/26/2016   CL 103 07/26/2016   CO2 25 07/26/2016    Lab Results  Component Value Date   ALT 14 (L) 07/21/2016   AST 16 07/21/2016   ALKPHOS 67 07/21/2016     Microbiology: Recent Results (from the past 240 hour(s))  Culture, blood (routine x 2)     Status: None (Preliminary result)   Collection Time: 07/20/16 11:53 PM  Result Value Ref Range Status   Specimen Description BLOOD LEFT ARM  Final   Special Requests BOTTLES DRAWN AEROBIC AND ANAEROBIC 6ML  Final   Culture NO GROWTH 4 DAYS  Final   Report Status PENDING  Incomplete  Culture, blood (routine x 2)     Status: None (Preliminary result)   Collection Time: 07/20/16 11:59 PM  Result Value Ref Range Status   Specimen Description BLOOD LEFT HAND  Final   Special Requests BOTTLES DRAWN AEROBIC AND ANAEROBIC 6ML  Final   Culture NO GROWTH 4 DAYS  Final   Report Status PENDING  Incomplete  MRSA PCR Screening     Status: None   Collection Time: 07/21/16  4:43 AM  Result Value Ref Range Status   MRSA by PCR NEGATIVE NEGATIVE Final    Comment:        The  GeneXpert MRSA Assay (FDA approved for NASAL specimens only), is one component of a comprehensive MRSA colonization surveillance program. It is not intended to diagnose MRSA infection nor to guide or monitor treatment for MRSA infections.   Surgical PCR screen     Status: None   Collection Time: 07/23/16 12:00 AM  Result Value Ref Range Status   MRSA, PCR NEGATIVE NEGATIVE Final   Staphylococcus aureus NEGATIVE NEGATIVE Final    Comment:        The Xpert SA Assay (FDA approved for NASAL specimens in patients over 35 years of age), is one component of a comprehensive surveillance program.  Test performance has been validated by [facility] for patients greater than or equal to 61 year old. It is not intended to diagnose infection nor to guide or monitor treatment.   Aerobic/Anaerobic Culture (surgical/deep wound)     Status: None (Preliminary result)   Collection Time: 07/25/16  3:02 PM  Result Value Ref Range Status   Specimen Description TISSUE  Final   Special Requests SWAB OF LUMBAR EPIDURAL  Final   Gram Stain   Final    MODERATE WBC PRESENT, PREDOMINANTLY PMN MODERATE GRAM POSITIVE COCCI IN PAIRS FEW GRAM VARIABLE ROD MODERATE GRAM POSITIVE RODS RARE GRAM NEGATIVE  RODS    Culture NO GROWTH < 24 HOURS  Final   Report Status PENDING  Incomplete  Aerobic/Anaerobic Culture (surgical/deep wound)     Status: None (Preliminary result)   Collection Time: 07/25/16  5:02 PM  Result Value Ref Range Status   Specimen Description TISSUE  Final   Special Requests L4 L4 INTERVERTEBRAL DISC  Final   Gram Stain   Final    FEW WBC PRESENT, PREDOMINANTLY PMN FEW GRAM NEGATIVE RODS FEW GRAM VARIABLE ROD MODERATE GRAM POSITIVE COCCI IN PAIRS FEW GRAM POSITIVE RODS    Culture NO GROWTH < 24 HOURS  Final   Report Status PENDING  Incomplete    Impression/Plan:  1. Discitis, osteomyelitis and abscess - Proprionibacterium grew previously, multiple appropriate cultures have been  sent from OR.  continue vancomycin and ceftazidime pending cultures.    [name] to follow up tomorrow

## 2016-07-26 NOTE — Progress Notes (Signed)
PROGRESS NOTE    Donald [name]  [id number] DOB: June 03, 1941 DOA: 07/20/2016 PCP: [name], MD   Brief Narrative: Donald [name] is a 75 y.o. male with a history of prostate cancer s/p prostatectomy and spinal stenosis s/p L3-4, L4-5 decompression. Patient presented with left hip pain and weakness and found to have a recurrent epidural abscess.   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 -continue antibiotics per ID recommendations -follow-up tissue cultures, gram stain, AFB culture -continue analgesics prn  Candidiasis -continue fluconazole per ID  Microcytic anemia Stable   DVT prophylaxis: SCDs Code Status: Full code Family Communication: Wife at bedside Disposition Plan: Discharge pending infectious disease recommendations for outpatient antibiotic regimen   Consultants:   Infectious disease  Orthopedic surgery  Procedures:   Ddebridement, laminectomy, posterior spinal fusion (11/14)  Antimicrobials:  Ceftriaxone (11/10)  Fluconazole (11/10>>  Ceftazidime (11/10>>   Vancomycin (11/10>>   Subjective: Patient reports having a long surgery yesterday. No issues overnight.  Objective: Vitals:   07/26/16 0030 07/26/16 0045 07/26/16 0108 07/26/16 0430  BP: (!) 154/77 137/85 130/70 117/63  Pulse: 79 82 79 77  Resp: 12 11 14 15   Temp:   97.3 F (36.3 C) 97.9 F (36.6 C)  TempSrc:   Oral Oral  SpO2: 100% 99% 97% 94%    Intake/Output Summary (Last 24 hours) at 07/26/16 1354 Last data filed at 07/26/16 0900  Gross per 24 hour  Intake          6689.25 ml  Output              3675 ml  Net          3014.25 ml   There were no vitals filed for this visit.  Examination:  General exam: Appears calm and comfortable Respiratory system: Clear to auscultation. Respiratory effort normal. Cardiovascular system: S1 & S2 heard, RRR. No murmurs, rubs, gallops or clicks. Gastrointestinal system: Abdomen is nondistended, soft and nontender. No organomegaly or masses felt. Normal bowel sounds heard. Central nervous system: Alert and oriented. No focal neurological deficits. 3/5 LE strength Extremities: No edema. No calf tenderness Skin: No cyanosis. No rashes Psychiatry: Judgement and insight appear normal. Mood & affect appropriate.     Data Reviewed: I have personally reviewed following labs and imaging studies  CBC:  Recent Labs Lab 07/20/16 1509 07/21/16 0500 07/25/16 1821 07/25/16 2031 07/26/16 0327  WBC 6.5 5.9  --   --  11.9*  NEUTROABS 4.6  --   --   --   --   HGB 8.2* 8.1* 10.9* 10.2* 9.2*  HCT 27.1* 26.4* 32.0* 30.0* 29.6*  MCV 76.8* 76.7*  --   --  77.1*  PLT 285 271  --   --  [id number]   Basic Metabolic Panel:  Recent Labs Lab 07/20/16 1509 07/21/16 0500 07/24/16 1155 07/25/16 1821 07/25/16 2031 07/26/16 0327  NA 140 141 140 140 139 137  K 3.9 3.7 4.1 4.2 4.3 4.3  CL 107 110 104  --   --  103  CO2 22 26 28   --   --  25  GLUCOSE 93 93  96 157* 191* 160*  BUN 11 8 10   --   --  10  CREATININE 0.75 0.80 0.89  --   --  0.78  CALCIUM 8.5* 8.3* 8.6*  --   --  8.4*   GFR: Estimated Creatinine Clearance: 90.3 mL/min (by C-G formula based on SCr of 0.78 mg/dL). Liver Function Tests:  Recent Labs Lab 07/20/16 1509 07/21/16 0500  AST 18 16  ALT 15* 14*  ALKPHOS 71 67  BILITOT 0.5 0.7  PROT 5.6* 5.7*  ALBUMIN 2.6* 2.4*   No results for input(s): LIPASE, AMYLASE in the last 168 hours. No results for input(s): AMMONIA in the last 168 hours. Coagulation Profile:  Recent Labs Lab 07/21/16 0500  INR 1.11   Cardiac Enzymes: No  results for input(s): CKTOTAL, CKMB, CKMBINDEX, TROPONINI in the last 168 hours. BNP (last 3 results) No results for input(s): PROBNP in the last 8760 hours. HbA1C: No results for input(s): HGBA1C in the last 72 hours. CBG: No results for input(s): GLUCAP in the last 168 hours. Lipid Profile: No results for input(s): CHOL, HDL, LDLCALC, TRIG, CHOLHDL, LDLDIRECT in the last 72 hours. Thyroid Function Tests: No results for input(s): TSH, T4TOTAL, FREET4, T3FREE, THYROIDAB in the last 72 hours. Anemia Panel: No results for input(s): VITAMINB12, FOLATE, FERRITIN, TIBC, IRON, RETICCTPCT in the last 72 hours. Sepsis Labs:  Recent Labs Lab 07/20/16 1517 07/20/16 2145  LATICACIDVEN 0.56 0.77    Recent Results (from the past 240 hour(s))  Culture, blood (routine x 2)     Status: None (Preliminary result)   Collection Time: 07/20/16 11:53 PM  Result Value Ref Range Status   Specimen Description BLOOD LEFT ARM  Final   Special Requests BOTTLES DRAWN AEROBIC AND ANAEROBIC 6ML  Final   Culture NO GROWTH 4 DAYS  Final   Report Status PENDING  Incomplete  Culture, blood (routine x 2)     Status: None (Preliminary result)   Collection Time: 07/20/16 11:59 PM  Result Value Ref Range Status   Specimen Description BLOOD LEFT HAND  Final   Special Requests BOTTLES DRAWN AEROBIC AND ANAEROBIC 6ML  Final   Culture NO GROWTH 4 DAYS  Final   Report Status PENDING  Incomplete  MRSA PCR Screening     Status: None   Collection Time: 07/21/16  4:43 AM  Result Value Ref Range Status   MRSA by PCR NEGATIVE NEGATIVE Final    Comment:        The GeneXpert MRSA Assay (FDA approved for NASAL specimens only), is one component of a comprehensive MRSA colonization surveillance program. It is not intended to diagnose MRSA infection nor to guide or monitor treatment for MRSA infections.   Surgical PCR screen     Status: None   Collection Time: 07/23/16 12:00 AM  Result Value Ref Range Status   MRSA,  PCR NEGATIVE NEGATIVE Final   Staphylococcus aureus NEGATIVE NEGATIVE Final    Comment:        The Xpert SA Assay (FDA approved for NASAL specimens in patients over 38 years of age), is one component of a comprehensive surveillance program.  Test performance has been validated by [facility] for patients greater than or equal to 5 year old. It is not intended to diagnose infection nor to guide or monitor treatment.   Aerobic/Anaerobic Culture (surgical/deep wound)     Status: None (Preliminary result)   Collection Time: 07/25/16  3:02 PM  Result Value Ref Range Status   Specimen  Description TISSUE  Final   Special Requests SWAB OF LUMBAR EPIDURAL  Final   Gram Stain   Final    MODERATE WBC PRESENT, PREDOMINANTLY PMN MODERATE GRAM POSITIVE COCCI IN PAIRS FEW GRAM VARIABLE ROD MODERATE GRAM POSITIVE RODS RARE GRAM NEGATIVE RODS    Culture NO GROWTH < 24 HOURS  Final   Report Status PENDING  Incomplete  Aerobic/Anaerobic Culture (surgical/deep wound)     Status: None (Preliminary result)   Collection Time: 07/25/16  5:02 PM  Result Value Ref Range Status   Specimen Description TISSUE  Final   Special Requests L4 L4 INTERVERTEBRAL DISC  Final   Gram Stain   Final    FEW WBC PRESENT, PREDOMINANTLY PMN FEW GRAM NEGATIVE RODS FEW GRAM VARIABLE ROD MODERATE GRAM POSITIVE COCCI IN PAIRS FEW GRAM POSITIVE RODS    Culture NO GROWTH < 24 HOURS  Final   Report Status PENDING  Incomplete         Radiology Studies: Dg Lumbar Spine 2-3 Views  Result Date: 07/25/2016 CLINICAL DATA:  Lumbar spinal fixation at L3-S1.  Initial encounter. EXAM: LUMBAR SPINE - 2-3 VIEW COMPARISON:  None. FINDINGS: Two fluoroscopic C-arm images are provided from the OR, demonstrating lumbar spinal fusion at L3-S1. Underlying decompression is noted. The patient is status post sacroiliac spinal fusion. IMPRESSION: Status post lumbar spinal fusion at L3-S1. Electronically Signed   By: Garald Balding M.D.    On: 07/25/2016 22:45   Dg Lumbar Spine 1 View  Result Date: 07/25/2016 CLINICAL DATA:  L3-S1 posterior fusion EXAM: LUMBAR SPINE - 1 VIEW COMPARISON:  Lumbar MRI July 20, 2016; lumbar spine CT July 21, 2016 FINDINGS: Cross-table lateral lumbar image time stamped 14:32:12 submitted. No metallic marker present. Evidence of discitis at L4-5, noted on recent studies. IMPRESSION: Evidence of discitis at L4-5 with erosion along the superior aspect of the L5 vertebral body. No metallic marker evident. Electronically Signed   By: Lowella Grip III M.D.   On: 07/25/2016 18:06   Dg C-arm Gt 120 Min  Result Date: 07/25/2016 CLINICAL DATA:  Lumbar spinal fixation at L3-S1.  Initial encounter. EXAM: LUMBAR SPINE - 2-3 VIEW COMPARISON:  None. FINDINGS: Two fluoroscopic C-arm images are provided from the OR, demonstrating lumbar spinal fusion at L3-S1. Underlying decompression is noted. The patient is status post sacroiliac spinal fusion. IMPRESSION: Status post lumbar spinal fusion at L3-S1. Electronically Signed   By: Garald Balding M.D.   On: 07/25/2016 22:45        Scheduled Meds: . sodium chloride   Intravenous Once  . cefTAZidime (FORTAZ)  IV  2 g Intravenous Q8H  . ferrous sulfate  325 mg Oral BID WC  . fluconazole  100 mg Oral Daily  . mouth rinse  15 mL Mouth Rinse BID  . multivitamin with minerals  1 tablet Oral Daily  . oxyCODONE  20 mg Oral Q12H  . polyethylene glycol  17 g Oral Daily  . senna-docusate  1 tablet Oral BID  . sodium chloride flush  3 mL Intravenous Q12H  . sodium chloride flush  3 mL Intravenous Q12H  . vancomycin  1,000 mg Intravenous Q12H   Continuous Infusions: . sodium chloride 75 mL/hr at 07/26/16 0314  . sodium chloride       LOS: 6 days     Cordelia Poche [facility] 07/26/2016, 1:54 PM Pager: [phone]  If 7PM-7AM, please contact night-coverage [email or web address] Password [email or web address] 07/26/2016, 1:54 PM

## 2016-07-26 NOTE — Evaluation (Addendum)
Physical Therapy Evaluation Patient Details Name: Donald [name] MRN: [id number] DOB: 1940/11/24 Today's Date: 07/26/2016   History of Present Illness  75 year old Caucasian male past history significant for prostate cancer status post prostatectomy, and spinal stenosis status post L3/L4 and L4/L5 decompression on 720/2017 by [name]. 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 He was placed on vancomycin and ceftriaxone to complete an 8 week course of home antibiotics via  RUE PICC line, presented with worsening back pain. Imaging studies showed osteomyelitis of L4-L5 and epidural abscess.  I &D and fusion L3-S1.  Clinical Impression  Pt admitted with above diagnosis. Pt currently with functional limitations due to the deficits listed below (see PT Problem List). Pt was able to stand and pivot to chair but needed mod assist of 2 with RW with LE weakness per pt. Pt will benefit from continued therapy. Pt will benefit from skilled PT to increase their independence and safety with mobility to allow discharge to the venue listed below.    Follow Up Recommendations Home health PT;Supervision for mobility/OOB    Equipment Recommendations  None recommended by PT    Recommendations for Other Services       Precautions / Restrictions Precautions Precautions: Back Precaution Booklet Issued: No Precaution Comments: Reviewed back precautions during tx. Required Braces or Orthoses: Spinal Brace Spinal Brace: Lumbar corset;Applied in sitting position (TLSO with LE extension is coming,MD wanted OOB with corset today) Restrictions Weight Bearing Restrictions: No      Mobility  Bed Mobility Overal bed mobility: Needs Assistance;+2 for physical assistance Bed Mobility: Rolling;Sidelying to Sit Rolling: Mod assist Sidelying to sit: Mod assist;+2 for physical assistance       General bed mobility comments: Pt  required mod assist for trunk control and assist with BLEs to transition into and out of bed.    Transfers Overall transfer level: Needs assistance Equipment used: Rolling walker (2 wheeled) Transfers: Sit to/from [employer] Sit to Stand: Mod assist;+2 physical assistance;From elevated surface Stand pivot transfers: Mod assist;+2 physical assistance       General transfer comment: Pt with posterior lean with difficulty with standing upright.  Pt states his LES feel like jello.  Pt was able to step around to recliner and felt like his knees may buckle however they didn't.   Ambulation/Gait                Stairs            Wheelchair Mobility    Modified Rankin (Stroke Patients Only)       Balance Overall balance assessment: Needs assistance;History of Falls Sitting-balance support: Bilateral upper extremity supported;Feet supported Sitting balance-Leahy Scale: Fair     Standing balance support: Bilateral upper extremity supported;During functional activity Standing balance-Leahy Scale: Poor Standing balance comment: relies on Bil UE support.  Posterior lean needing +2 assist and RW.                             Pertinent Vitals/Pain Pain Assessment: 0-10 Pain Score: 6  Pain Location: left hip and back Pain Descriptors / Indicators: Aching;Radiating;Tightness Pain Intervention(s): Limited activity within patient's tolerance;Monitored during session;Repositioned   VSS  Home Living Family/patient expects to be discharged to:: Private residence Living Arrangements: Spouse/significant other Available Help at Discharge: Family;Available 24 hours/day Type of Home: House Home Access: Stairs to enter Entrance Stairs-Rails: Left Entrance Stairs-Number of Steps: 3  Home Layout: One level Home Equipment: Walker - 2 wheels;Cane - single point;Crutches;Bedside commode;Shower seat      Prior Function Level of Independence: Needs assistance    Gait / Transfers Assistance Needed: Using RW  ADL's / Homemaking Assistance Needed: Needs min assist at times for LB dressing        Hand Dominance   Dominant Hand: Right    Extremity/Trunk Assessment   Upper Extremity Assessment: Defer to OT evaluation           Lower Extremity Assessment: RLE deficits/detail;LLE deficits/detail RLE Deficits / Details: grossly 3/5 LLE Deficits / Details: grossly 3/5  Cervical / Trunk Assessment: Kyphotic  Communication   Communication: No difficulties  Cognition Arousal/Alertness: Awake/alert Behavior During Therapy: WFL for tasks assessed/performed Overall Cognitive Status: Within Functional Limits for tasks assessed                      General Comments      Exercises General Exercises - Lower Extremity Ankle Circles/Pumps: AROM;Both;10 reps;Supine Quad Sets: AROM;Both;10 reps;Supine Long Arc Quad: AROM;Both;10 reps;Seated   Assessment/Plan    PT Assessment Patient needs continued PT services  PT Problem List Decreased strength;Decreased range of motion;Decreased activity tolerance;Decreased balance;Decreased mobility          PT Treatment Interventions DME instruction;Gait training;Stair training;Functional mobility training;Therapeutic activities;Therapeutic exercise;Patient/family education    PT Goals (Current goals can be found in the Care Plan section)  Acute Rehab PT Goals Patient Stated Goal: be able to walk and move without pain PT Goal Formulation: With patient Time For Goal Achievement: 08/09/16 Potential to Achieve Goals: Good    Frequency Min 5X/week   Barriers to discharge        Co-evaluation               End of Session Equipment Utilized During Treatment: Gait belt;Back brace (tighten LSO in standing.) Activity Tolerance: Patient limited by fatigue;Patient limited by pain Patient left: in chair;with call bell/phone within reach;with family/visitor present Nurse Communication:  Mobility status         Time: [id number] PT Time Calculation (min) (ACUTE ONLY): 24 min   Charges:   PT Evaluation $PT Eval Moderate Complexity: 1 Procedure PT Treatments $Therapeutic Activity: 8-22 mins   PT G Codes:        Denice Paradise 08-07-16, 2:08 PM [facility] Acute Rehabilitation [phone] (pager)

## 2016-07-27 ENCOUNTER — Encounter (HOSPITAL_COMMUNITY): Payer: Self-pay | Admitting: Orthopedic Surgery

## 2016-07-27 DIAGNOSIS — G8220 Paraplegia, unspecified: Secondary | ICD-10-CM

## 2016-07-27 DIAGNOSIS — M48061 Spinal stenosis, lumbar region without neurogenic claudication: Secondary | ICD-10-CM

## 2016-07-27 LAB — VANCOMYCIN, TROUGH: Vancomycin Tr: 19 ug/mL (ref 15–20)

## 2016-07-27 LAB — CBC
HEMATOCRIT: 24.8 % — AB (ref 39.0–52.0)
Hemoglobin: 7.5 g/dL — ABNORMAL LOW (ref 13.0–17.0)
MCH: 23.8 pg — ABNORMAL LOW (ref 26.0–34.0)
MCHC: 30.2 g/dL (ref 30.0–36.0)
MCV: 78.7 fL (ref 78.0–100.0)
PLATELETS: 255 10*3/uL (ref 150–400)
RBC: 3.15 MIL/uL — AB (ref 4.22–5.81)
RDW: 17 % — ABNORMAL HIGH (ref 11.5–15.5)
WBC: 9.5 10*3/uL (ref 4.0–10.5)

## 2016-07-27 LAB — ACID FAST SMEAR (AFB)

## 2016-07-27 LAB — PREPARE RBC (CROSSMATCH)

## 2016-07-27 LAB — ACID FAST SMEAR (AFB, MYCOBACTERIA)
Acid Fast Smear: NEGATIVE
Acid Fast Smear: NEGATIVE

## 2016-07-27 MED ORDER — SODIUM CHLORIDE 0.9 % IV SOLN
Freq: Once | INTRAVENOUS | Status: AC
  Administered 2016-07-27: 13:00:00 via INTRAVENOUS

## 2016-07-27 MED ORDER — VANCOMYCIN HCL IN DEXTROSE 1-5 GM/200ML-% IV SOLN
1000.0000 mg | Freq: Two times a day (BID) | INTRAVENOUS | Status: DC
  Administered 2016-07-28 – 2016-07-31 (×6): 1000 mg via INTRAVENOUS
  Filled 2016-07-27 (×8): qty 200

## 2016-07-27 MED ORDER — DEXTROSE 5 % IV SOLN
2.0000 g | Freq: Three times a day (TID) | INTRAVENOUS | Status: DC
  Administered 2016-07-28 – 2016-07-31 (×11): 2 g via INTRAVENOUS
  Filled 2016-07-27 (×15): qty 2

## 2016-07-27 NOTE — Evaluation (Signed)
Occupational Therapy Evaluation Patient Details Name: Donald [name] MRN: [id number] DOB: 1941/01/13 Today's Date: 07/27/2016    History of Present Illness 75 year old Caucasian male past history significant for prostate cancer status post prostatectomy, and spinal stenosis status post L3/L4 and L4/L5 decompression on 720/2017 by [name]. 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 He was placed on vancomycin and ceftriaxone to complete an 8 week course of home antibiotics via  RUE PICC line, presented with worsening back pain. Imaging studies showed osteomyelitis of L4-L5 and epidural abscess.  I &D and fusion L3-S1.   Clinical Impression   Pt with decline in function and safety with ADLs and ADL mobility. Pt is limited by pain, decreased activity tolerance and strength with impaired sitting and standing balance with +2 mod - max A for mobility. Pt requires extensive assist with LB ADLs and now has TLSO with LE extension. Pt would benefit from acute OT services to address impairments to increase level of function and safety    Follow Up Recommendations  CIR    Equipment Recommendations  None recommended by OT    Recommendations for Other Services       Precautions / Restrictions Precautions Precautions: Back Precaution Booklet Issued: No Precaution Comments: Reviewed back precautions during tx. Required Braces or Orthoses: Spinal Brace Spinal Brace: Thoracolumbosacral orthotic;Other (comment) (LE extension) Restrictions Weight Bearing Restrictions: No      Mobility Bed Mobility Overal bed mobility: Needs Assistance;+2 for physical assistance Bed Mobility: Rolling;Sidelying to Sit Rolling: Mod assist;Max assist;+2 for physical assistance Sidelying to sit: Mod assist;Max assist;+2 for physical assistance     Sit to sidelying: Mod assist;Max assist;+2 for physical assistance General bed  mobility comments: Bio tech brought brace and assisted OT and PT with application of brace.  Difficult with placement as it was donned and doffed in supine.  Pt tolerated fairly well.  Will need further ed with pt and wife. Pt required mod to max assist for trunk control and assist with BLEs to transition into and out of bed.  Pt recalls back precautions and is doing well learing log roll.    Transfers Overall transfer level: Needs assistance Equipment used: Rolling walker (2 wheeled) Transfers: Sit to/from Stand Sit to Stand: Mod assist;+2 physical assistance;From elevated surface;Max assist Stand pivot transfers: Mod assist;+2 physical assistance       General transfer comment: Pt with posterior lean with difficulty with standing upright.  Pt states his LES feel like jello.  Bio tech stayed present and assisted with locking the left LE into extension.  Needed mod assist for static stance as pt with heavy left lateral lean.    Balance Overall balance assessment: Needs assistance Sitting-balance support: Bilateral upper extremity supported;Feet supported Sitting balance-Leahy Scale: Poor (posterior leaning) Sitting balance - Comments: required bil UE support for balance in sitting.  Postural control: Posterior lean;Left lateral lean Standing balance support: Bilateral upper extremity supported;During functional activity Standing balance-Leahy Scale: Poor Standing balance comment: relies on bil UE support.  Heavy posterior and left lateral lean.                             ADL Overall ADL's : Needs assistance/impaired     Grooming: Minimal assistance;Sitting Grooming Details (indicate cue type and reason): Poor balance Upper Body Bathing: Moderate assistance (simulated) Upper Body Bathing Details (indicate cue type and reason): Poor balance, TLSO Lower Body  Bathing: Total assistance   Upper Body Dressing : Moderate assistance;Sitting Upper Body Dressing Details (indicate  cue type and reason): Poor balance, TLSO Lower Body Dressing: Total assistance   Toilet Transfer: Moderate assistance;Maximal assistance;RW;+2 for safety/equipment;+2 for physical assistance (simulated)   Toileting- Clothing Manipulation and Hygiene: Total assistance;Sit to/from stand       Functional mobility during ADLs: Rolling walker;Moderate assistance;Maximal assistance;+2 for physical assistance;+2 for safety/equipment General ADL Comments: Pt has shower chair, reacher, sock aid and LH shoe horn at home     Vision Vision Assessment?: No apparent visual deficits              Pertinent Vitals/Pain Pain Assessment: 0-10 Pain Score: 6  Faces Pain Scale: Hurts even more Pain Location: back Pain Descriptors / Indicators: Aching;Tightness;Radiating Pain Intervention(s): Limited activity within patient's tolerance;Monitored during session;Repositioned     Hand Dominance Right   Extremity/Trunk Assessment Upper Extremity Assessment Upper Extremity Assessment: Generalized weakness   Lower Extremity Assessment Lower Extremity Assessment: Defer to PT evaluation   Cervical / Trunk Assessment Cervical / Trunk Assessment: Kyphotic   Communication Communication Communication: No difficulties   Cognition Arousal/Alertness: Awake/alert Behavior During Therapy: WFL for tasks assessed/performed Overall Cognitive Status: Within Functional Limits for tasks assessed                     General Comments   pt cooperative                 Home Living Family/patient expects to be discharged to:: Private residence Living Arrangements: Spouse/significant other Available Help at Discharge: Family;Available 24 hours/day Type of Home: House Home Access: Stairs to enter [employer] of Steps: 3 Entrance Stairs-Rails: Left Home Layout: One level     Bathroom Shower/Tub: Occupational psychologist: Standard Bathroom Accessibility: Yes   Home  Equipment: Environmental consultant - 2 wheels;Cane - single point;Crutches;Bedside commode;Shower seat;Adaptive equipment Adaptive Equipment: Reacher;Sock aid;Long-handled shoe horn        Prior Functioning/Environment Level of Independence: Needs assistance  Gait / Transfers Assistance Needed: Using RW ADL's / Homemaking Assistance Needed: Needs min assist at times for LB dressing   Comments: using rw PTA        OT Problem List: Decreased strength;Decreased activity tolerance;Impaired balance (sitting and/or standing);Decreased knowledge of use of DME or AE;Pain   OT Treatment/Interventions: Self-care/ADL training;DME and/or AE instruction;Therapeutic activities;Patient/family education;Balance training    OT Goals(Current goals can be found in the care plan section) Acute Rehab OT Goals Patient Stated Goal: be able to walk and move without pain OT Goal Formulation: With patient/family Time For Goal Achievement: 08/03/16 Potential to Achieve Goals: Good ADL Goals Pt Will Perform Grooming: with min guard assist;with supervision;sitting Pt Will Perform Upper Body Bathing: with min guard assist;sitting Pt Will Perform Lower Body Bathing: with adaptive equipment;with max assist;with mod assist;sitting/lateral leans Pt Will Perform Upper Body Dressing: with min guard assist;sitting Pt Will Transfer to Toilet: with mod assist;with min assist;with +2 assist;bedside commode;ambulating Pt Will Perform Toileting - Clothing Manipulation and hygiene: with mod assist;sit to/from stand;with caregiver independent in assisting Additional ADL Goal #1: Pt/family education for donning/doffing TLSO brace  OT Frequency: Min 2X/week   Barriers to D/C: Decreased caregiver support  pt requires extensive assist at this time and now has TLSO brace       Co-evaluation PT/OT/SLP Co-Evaluation/Treatment: Yes Reason for Co-Treatment: Complexity of the patient's impairments (multi-system involvement);For patient/therapist  safety   OT goals addressed during session: ADL's  and self-care      End of Session Equipment Utilized During Treatment: Gait belt;Rolling walker;Back brace  Activity Tolerance: Patient limited by pain;Patient limited by fatigue Patient left: with call bell/phone within reach;with family/visitor present;in bed   Time: [id number] OT Time Calculation (min): 36 min Charges:  OT General Charges $OT Visit: 1 Procedure OT Evaluation $OT Eval Moderate Complexity: 1 Procedure G-Codes:    Britt Bottom 07/27/2016, 1:50 PM

## 2016-07-27 NOTE — Consult Note (Signed)
Physical Medicine and Rehabilitation Consult   Reason for Consult: Lumbar discitis with radiculopathy.  Referring Physician: Dr. Teryl Lucy   HPI: Donald [name] is a 75 y.o. male with history of prostate cancer, lumbar stenosis s/p L3-L5 decompression 03/30/16 who started developing BLE pain with spasms and was found to have L4-L5 osteomyelitis with epidural abscess. He was admitted for I & D of lumbar wound on 06/29/16 and discharged to home with plans for IV Vanc/Ceftriaxone to continue thorough 08/23/16. He was readmitted with 07/20/16 with increase in back pain with radiculopathy and decline in mobility.  Follow up MRI showed recurrence of abscess and progressive discitis/osteomyelitis with destruction of L4 and L5 vertebral body. Dr. Rolena Infante consulted for second opinion and concurred with recommendations of surgical intervention. Patient underwent   Revision of L4/5 decompression with  I and D of infected material and  posterior fusion L3-S1.. Dr. Linus Salmons recommends continuing current antibiotics till cultures finalize. In process of receiving 2 units PRBC for ABLA. TLSO with left leg extension ordered for support and therapy evaluations completed today revealing decline in mobility as well as ability to carry out self care tasks. CIR recommended for follow up therapy.   Became dizzy when patient was up with PT today. Hemoglobin 7.5, now receiving transfusion  Review of Systems  HENT: Negative for hearing loss and tinnitus.   Eyes: Negative for blurred vision and double vision.  Respiratory: Negative for shortness of breath.   Cardiovascular: Negative for chest pain and palpitations.  Gastrointestinal: Positive for constipation. Negative for heartburn and nausea.  Genitourinary: Negative for dysuria.  Musculoskeletal: Negative for back pain and myalgias.  Skin: Negative for itching and rash.  Neurological: Positive for sensory change (LLE > RLE), focal weakness (BLE weakness) and  weakness. Negative for dizziness and headaches.  Psychiatric/Behavioral: Positive for depression. Negative for memory loss. The patient does not have insomnia.       Past Medical History:  Diagnosis Date  . Adenomatous polyps 10/2002, 06/2009  . Arthritis   . BPH (benign prostatic hyperplasia)   . Hyperlipemia   . Prostate cancer ([place])   . Spinal stenosis   . Wears glasses     Past Surgical History:  Procedure Laterality Date  . ANTERIOR CERVICAL DECOMP/DISCECTOMY FUSION N/A 09/18/2013   Procedure: Anterior cervical decompression fusion, cervical 4-5, cervical 5-6, cervical 6-7 with instrumentation and allograft;  Surgeon: Sinclair Ship, MD;  Location: [place] [facility];  Service: Orthopedics;  Laterality: N/A;  Anterior cervical decompression fusion, cervical 4-5, cervical 5-6, cervical 6-7 with instrumentation and allograft  . COLONOSCOPY    . COLONOSCOPY W/ POLYPECTOMY  11/04/2002   Sigmoid adenomas x2, 1 cm maximum  . COLONOSCOPY W/ POLYPECTOMY  06/08/2009   6 polyps, largest 8 mm, at least 3 adenomas  . CYSTOSCOPY  04/2010  . ESOPHAGOGASTRODUODENOSCOPY  11/04/2002   Duodenitis, gastritis without H. pylori   . HIP ARTHROPLASTY  04/2010   Left - Dr. Mayer Camel, right 2012  . JOINT REPLACEMENT    . LUMBAR LAMINECTOMY/DECOMPRESSION MICRODISCECTOMY N/A 03/30/2016   Procedure: LUMBAR 3-4, LUMBAR 4-5 DECOMPRESSION ;  Surgeon: Phylliss Bob, MD;  Location: [facility];  Service: Orthopedics;  Laterality: N/A;  LUMBAR 3-4, LUMBAR 4-5 DECOMPRESSION   . LUMBAR WOUND DEBRIDEMENT N/A 06/29/2016   Procedure: LUMBAR WOUND EXPLORATION AND IRRIGATION AND  DEBRIDEMENT;  Surgeon: Phylliss Bob, MD;  Location: [place];  Service: Orthopedics;  Laterality: N/A;  . PROSTATE SURGERY    . RETROPUBIC PROSTATECTOMY  09/2003  . TONSILLECTOMY    . UPPER GASTROINTESTINAL ENDOSCOPY      Family History  Problem Relation Age of Onset  . Diabetes Mother   . Heart disease Mother   . Prostate cancer Father   . Cancer Brother      prostate  . Colon cancer Neg Hx   . Stomach cancer Neg Hx     Social History:  Married. Retired Therapist, sports. He  reports that he quit smoking about 43 years ago. His smoking use included Cigars. He has never used smokeless tobacco. He reports that he has not had any alcohol since July 2017.  He reports that he does not use drugs.    Allergies  Allergen Reactions  . Iodine Anaphylaxis  . Ivp Dye [Iodinated Diagnostic Agents] Anaphylaxis    Medications Prior to Admission  Medication Sig Dispense Refill  . cefTRIAXone 2 g in dextrose 5 % 50 mL Inject 2 g into the vein daily. End date 08/23/2016 112 g 0  . clotrimazole (LOTRIMIN) 1 % cream Apply 1 application topically 2 (two) times daily. 30 g 0  . docusate sodium (COLACE) 100 MG capsule Take 100 mg by mouth daily.    Marland Kitchen HYDROcodone-acetaminophen (NORCO) 5-325 MG tablet Take 1-2 tablets by mouth every 6 (six) hours as needed for moderate pain. 40 tablet 0  . methocarbamol (ROBAXIN) 500 MG tablet Take 500 mg by mouth 3 (three) times daily.     . Multiple Vitamin (MULTIVITAMIN) capsule Take 1 capsule by mouth daily.      Marland Kitchen oxyCODONE-acetaminophen (PERCOCET/ROXICET) 5-325 MG tablet Take 1-2 tablets by mouth every 6 (six) hours as needed for severe pain.    Marland Kitchen terbinafine (LAMISIL) 250 MG tablet Take 1 tablet (250 mg total) by mouth daily. 7 tablet 0  . vancomycin (VANCOCIN) 1-5 GM/200ML-% SOLN Inject 200 mLs (1,000 mg total) into the vein every 12 (twelve) hours. Up until 08/23/2016 22400 mL 0  . zolpidem (AMBIEN) 10 MG tablet TAKE 1/2 TABLET AT BEDTIME AS NEEDED FOR SLEEP. 45 tablet 0    Home: Home Living Family/patient expects to be discharged to:: Private residence Living Arrangements: Spouse/significant other Available Help at Discharge: Family, Available 24 hours/day Type of Home: House Home Access: Stairs to enter [employer] of Steps: 3 Entrance Stairs-Rails: Left Home Layout: One level Bathroom Shower/Tub:  Multimedia programmer: Standard Bathroom Accessibility: Yes Home Equipment: Environmental consultant - 2 wheels, Cane - single point, Crutches, Bedside commode, Shower seat, Financial controller: Reacher, Sock aid, Long-handled shoe horn  Functional History: Prior Function Level of Independence: Needs assistance Gait / Transfers Assistance Needed: Using RW ADL's / Homemaking Assistance Needed: Needs min assist at times for LB dressing Comments: using rw PTA Functional Status:  Mobility: Bed Mobility Overal bed mobility: Needs Assistance, +2 for physical assistance Bed Mobility: Rolling, Sidelying to Sit Rolling: Mod assist, Max assist, +2 for physical assistance Sidelying to sit: Mod assist, Max assist, +2 for physical assistance Sit to sidelying: Mod assist, Max assist, +2 for physical assistance General bed mobility comments: Bio tech brought brace and assisted OT and PT with application of brace.  Difficult with placement as it was donned and doffed in supine.  Pt tolerated fairly well.  Will need further ed with pt and wife. Pt required mod to max assist for trunk control and assist with BLEs to transition into and out of bed.  Pt recalls back precautions and is doing well learing log roll.   Transfers Overall  transfer level: Needs assistance Equipment used: Rolling walker (2 wheeled) Transfers: Sit to/from Stand Sit to Stand: Mod assist, +2 physical assistance, From elevated surface, Max assist Stand pivot transfers: Mod assist, +2 physical assistance General transfer comment: Pt with posterior lean with difficulty with standing upright.  Pt states his LES feel like jello.  Bio tech stayed present and assisted with locking the left LE into extension.  Needed mod assist for static stance as pt with heavy left lateral lean. Ambulation/Gait Ambulation/Gait assistance: Mod assist, Max assist, +2 physical assistance Ambulation Distance (Feet): 20 Feet Assistive device: Standard  walker Gait Pattern/deviations: Step-to pattern, Shuffle, Decreased stride length, Antalgic General Gait Details: Pt with heavy posterior and left lateral lean needing constant verbal and tactile cues as well as mod to max assist to ambulate around bed with PT and OT assisting with stablility, movment of walker and postural stability.  Gait velocity: slow pattern Gait velocity interpretation: Below normal speed for age/gender    ADL: ADL Overall ADL's : Needs assistance/impaired Grooming: Minimal assistance, Sitting Grooming Details (indicate cue type and reason): Poor balance Upper Body Bathing: Moderate assistance (simulated) Upper Body Bathing Details (indicate cue type and reason): Poor balance, TLSO Lower Body Bathing: Total assistance Upper Body Dressing : Moderate assistance, Sitting Upper Body Dressing Details (indicate cue type and reason): Poor balance, TLSO Lower Body Dressing: Total assistance Toilet Transfer: Moderate assistance, Maximal assistance, RW, +2 for safety/equipment, +2 for physical assistance (simulated) Toileting- Clothing Manipulation and Hygiene: Total assistance, Sit to/from stand Functional mobility during ADLs: Rolling walker, Moderate assistance, Maximal assistance, +2 for physical assistance, +2 for safety/equipment General ADL Comments: Pt has shower chair, reacher, sock aid and LH shoe horn at home  Cognition: Cognition Overall Cognitive Status: Within Functional Limits for tasks assessed Orientation Level: Oriented X4 Cognition Arousal/Alertness: Awake/alert Behavior During Therapy: WFL for tasks assessed/performed Overall Cognitive Status: Within Functional Limits for tasks assessed   Blood pressure (!) 104/59, pulse 69, temperature 99.4 F (37.4 C), temperature source Oral, resp. rate 17, SpO2 96 %. Physical Exam  Nursing note and vitals reviewed. Constitutional: He is oriented to person, place, and time. He appears well-developed and  well-nourished.  HENT:  Head: Normocephalic and atraumatic.  Eyes: Conjunctivae are normal. Pupils are equal, round, and reactive to light.  Neck: Normal range of motion. Neck supple.  Cardiovascular: Normal rate and regular rhythm.   Respiratory: Effort normal and breath sounds normal. No stridor. No respiratory distress. He has no wheezes.  GI: Soft. Bowel sounds are normal. He exhibits no distension. There is no tenderness.  Musculoskeletal: He exhibits no edema or tenderness.  Neurological: He is alert and oriented to person, place, and time. Coordination abnormal.  Skin: Skin is warm and dry.  Onychomycotic toe nails.   Psychiatric: He has a normal mood and affect. His behavior is normal. Thought content normal.  Motor strength is 5/5 bilateral deltoids, biceps, triceps, grip Sensation is normal in bilateral upper limbs. 3 minus bilateral hip flexor, 4 minus. Knee extensor, 3 minus. Left uncal dorsiflexor, 4 minus. Right ankle dorsiflexor. Sensation normal in both lower limbs to light touch. There is normal tone in both lower limbs  Results for orders placed or performed during the hospital encounter of 07/20/16 (from the past 24 hour(s))  CBC     Status: Abnormal   Collection Time: 07/27/16  4:57 AM  Result Value Ref Range   WBC 9.5 4.0 - 10.5 K/uL   RBC 3.15 (L) 4.22 - 5.81  MIL/uL   Hemoglobin 7.5 (L) 13.0 - 17.0 g/dL   HCT 24.8 (L) 39.0 - 52.0 %   MCV 78.7 78.0 - 100.0 fL   MCH 23.8 (L) 26.0 - 34.0 pg   MCHC 30.2 30.0 - 36.0 g/dL   RDW 17.0 (H) 11.5 - 15.5 %   Platelets 255 150 - 400 K/uL  Prepare RBC     Status: None   Collection Time: 07/27/16  1:13 PM  Result Value Ref Range   Order Confirmation ORDER PROCESSED BY BLOOD BANK   Vancomycin, trough     Status: None   Collection Time: 07/27/16  1:58 PM  Result Value Ref Range   Vancomycin Tr 19 15 - 20 ug/mL   Dg Lumbar Spine 2-3 Views  Result Date: 07/25/2016 CLINICAL DATA:  Lumbar spinal fixation at L3-S1.   Initial encounter. EXAM: LUMBAR SPINE - 2-3 VIEW COMPARISON:  None. FINDINGS: Two fluoroscopic C-arm images are provided from the OR, demonstrating lumbar spinal fusion at L3-S1. Underlying decompression is noted. The patient is status post sacroiliac spinal fusion. IMPRESSION: Status post lumbar spinal fusion at L3-S1. Electronically Signed   By: Garald Balding M.D.   On: 07/25/2016 22:45   Dg C-arm Gt 120 Min  Result Date: 07/25/2016 CLINICAL DATA:  Lumbar spinal fixation at L3-S1.  Initial encounter. EXAM: LUMBAR SPINE - 2-3 VIEW COMPARISON:  None. FINDINGS: Two fluoroscopic C-arm images are provided from the OR, demonstrating lumbar spinal fusion at L3-S1. Underlying decompression is noted. The patient is status post sacroiliac spinal fusion. IMPRESSION: Status post lumbar spinal fusion at L3-S1. Electronically Signed   By: Garald Balding M.D.   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 Does the need for close, 24 hr/day medical supervision in concert with the patient's rehab needs make it unreasonable for this patient to be served in a less intensive setting? Yes 2. Co-Morbidities requiring supervision/potential complications: Acute blood loss anemia, urinary retention 3. Due to bladder management, bowel management, safety, skin/wound care, disease management, medication administration, pain management and patient education, does the patient require 24 hr/day rehab nursing? Yes 4. Does the patient require coordinated care of a physician, rehab nurse, PT (1-2 hrs/day, 5 days/week) and OT (1-2 hrs/day, 5 days/week) to address physical and functional deficits in the context of the above medical diagnosis(es)? Yes Addressing deficits in the following areas: balance, endurance, locomotion, strength, transferring, bowel/bladder control, bathing, dressing, feeding, grooming, toileting and cognition 5. Can the patient actively  participate in an intensive therapy program of at least 3 hrs of therapy per day at least 5 days per week? Yes 6. The potential for patient to make measurable gains while on inpatient rehab is excellent 7. Anticipated functional outcomes upon discharge from inpatient rehab are modified independent and supervision  with PT, modified independent and supervision with OT, n/a with SLP. 8. Estimated rehab length of stay to reach the above functional goals is: 7-10d 9. Does the patient have adequate social supports and living environment to accommodate these discharge functional goals? Yes 10. Anticipated D/C setting: Home 11. Anticipated post D/C treatments: [place] therapy 12. Overall Rehab/Functional Prognosis: good  RECOMMENDATIONS: This patient's condition is appropriate for continued rehabilitative care in the following setting: CIR Patient has agreed to participate in recommended program. Yes Note that insurance prior authorization may be required for reimbursement for recommended care.  Comment:     07/27/2016

## 2016-07-27 NOTE — Progress Notes (Signed)
Pt asked to see if he was impacted, pt not impacted, stool softer given, states he is starting to pass a very little gas

## 2016-07-27 NOTE — Progress Notes (Signed)
Physical Therapy Treatment Patient Details Name: Donald [name] MRN: [id number] DOB: May 26, 1941 Today's Date: 07/27/2016    History of Present Illness 75 year old Caucasian male past history significant for prostate cancer status post prostatectomy, and spinal stenosis status post L3/L4 and L4/L5 decompression on 720/2017 by [name]. 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 He was placed on vancomycin and ceftriaxone to complete an 8 week course of home antibiotics via  RUE PICC line, presented with worsening back pain. Imaging studies showed osteomyelitis of L4-L5 and epidural abscess.  I &D and fusion L3-S1.    PT Comments    Pt admitted with above diagnosis. Pt currently with functional limitations due to the deficits listed below (see PT Problem List).  Pt was able to ambulate with +2 mod to max assist with RW around the bed.  Took 2 persons to apply brace and pt and wife will need extensive education with this.  For this reason, feel that pt will benefit from Rehab. Called [facility] to please assess this pt for Rehab.  Pt will benefit from skilled PT to increase their independence and safety with mobility to allow discharge to the venue listed below.    Follow Up Recommendations  CIR;Supervision/Assistance - 24 hour     Equipment Recommendations  Other (comment) (TBA)    Recommendations for Other Services Rehab consult     Precautions / Restrictions Precautions Precautions: Back Precaution Booklet Issued: No Precaution Comments: Reviewed back precautions during tx. Required Braces or Orthoses: Spinal Brace Spinal Brace: Thoracolumbosacral orthotic;Other (comment) (LE extension) Restrictions Weight Bearing Restrictions: No    Mobility  Bed Mobility Overal bed mobility: Needs Assistance;+2 for physical assistance Bed Mobility: Rolling;Sidelying to Sit Rolling: Mod assist;Max assist;+2 for  physical assistance Sidelying to sit: Mod assist;Max assist;+2 for physical assistance     Sit to sidelying: Mod assist;Max assist;+2 for physical assistance General bed mobility comments: Bio tech brought brace and assisted OT and PT with application of brace.  Difficult with placement as it was donned and doffed in supine.  Pt tolerated fairly well.  Will need further ed with pt and wife. Pt required mod to max assist for trunk control and assist with BLEs to transition into and out of bed.  Pt recalls back precautions and is doing well learing log roll.    Transfers Overall transfer level: Needs assistance Equipment used: Rolling walker (2 wheeled) Transfers: Sit to/from Stand Sit to Stand: Mod assist;+2 physical assistance;From elevated surface;Max assist Stand pivot transfers: Mod assist;+2 physical assistance       General transfer comment: Pt with posterior lean with difficulty with standing upright.  Pt states his LES feel like jello.  Bio tech stayed present and assisted with locking the left LE into extension.  Needed mod assist for static stance as pt with heavy left lateral lean.  Ambulation/Gait Ambulation/Gait assistance: Mod assist;Max assist;+2 physical assistance Ambulation Distance (Feet): 20 Feet Assistive device: Standard walker Gait Pattern/deviations: Step-to pattern;Shuffle;Decreased stride length;Antalgic Gait velocity: slow pattern Gait velocity interpretation: Below normal speed for age/gender General Gait Details: Pt with heavy posterior and left lateral lean needing constant verbal and tactile cues as well as mod to max assist to ambulate around bed with PT and OT assisting with stablility, movment of walker and postural stability.    Stairs            Wheelchair Mobility    Modified Rankin (Stroke Patients Only)  Balance Overall balance assessment: Needs assistance Sitting-balance support: Bilateral upper extremity supported;Feet  supported Sitting balance-Leahy Scale: Poor (posterior leaning) Sitting balance - Comments: required bil UE support for balance in sitting.  Postural control: Posterior lean;Left lateral lean Standing balance support: Bilateral upper extremity supported;During functional activity Standing balance-Leahy Scale: Poor Standing balance comment: relies on bil UE support.  Heavy posterior and left lateral lean.                     Cognition Arousal/Alertness: Awake/alert Behavior During Therapy: WFL for tasks assessed/performed Overall Cognitive Status: Within Functional Limits for tasks assessed                      Exercises      General Comments        Pertinent Vitals/Pain Pain Assessment: 0-10 Pain Score: 6  Faces Pain Scale: Hurts even more Pain Location: back Pain Descriptors / Indicators: Aching;Tightness;Radiating Pain Intervention(s): Limited activity within patient's tolerance;Monitored during session;Repositioned  VSS  Home Living Family/patient expects to be discharged to:: Private residence Living Arrangements: Spouse/significant other Available Help at Discharge: Family;Available 24 hours/day Type of Home: House Home Access: Stairs to enter Entrance Stairs-Rails: Left Home Layout: One level Home Equipment: Walker - 2 wheels;Cane - single point;Crutches;Bedside commode;Shower seat;Adaptive equipment      Prior Function Level of Independence: Needs assistance  Gait / Transfers Assistance Needed: Using RW ADL's / Homemaking Assistance Needed: Needs min assist at times for LB dressing Comments: using rw PTA   PT Goals (current goals can now be found in the care plan section) Acute Rehab PT Goals Patient Stated Goal: be able to walk and move without pain Progress towards PT goals: Progressing toward goals    Frequency    Min 5X/week      PT Plan Discharge plan updated    Co-evaluation             End of Session Equipment Utilized  During Treatment: Gait belt;Back brace Activity Tolerance: Patient limited by fatigue;Patient limited by pain Patient left: in bed;with call bell/phone within reach;with family/visitor present;with SCD's reapplied     Time: [id number] PT Time Calculation (min) (ACUTE ONLY): 40 min  Charges:  $Gait Training: 8-22 mins $Therapeutic Activity: 8-22 mins $Self Care/Home Management: 8-22                    G Codes:      Donald [name] Donald [name] 08-09-16, 1:47 PM [employer] Acute Rehabilitation [phone] (pager)

## 2016-07-27 NOTE — Progress Notes (Signed)
PROGRESS NOTE    Donald [name]  [id number] DOB: 01-20-1941 DOA: 07/20/2016 PCP: [name], MD   Brief Narrative: Donald [name] is a 75 y.o. male with a history of prostate cancer s/p prostatectomy and spinal stenosis s/p L3-4, L4-5 decompression. Patient presented with left hip pain and weakness and found to have a recurrent epidural abscess.   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 -continue antibiotics per ID recommendations -follow-up tissue cultures, gram stain, AFB culture -continue analgesics prn -PT recommendations: CIR (consult placed)  Candidiasis -continue fluconazole per ID  Microcytic anemia Stable   DVT prophylaxis: SCDs Code Status: Full code Family Communication: Wife at bedside Disposition Plan: Discharge pending infectious disease recommendations for outpatient antibiotic regimen   Consultants:   Infectious disease  Orthopedic surgery  Procedures:   Ddebridement, laminectomy, posterior spinal fusion (11/14)  Antimicrobials:  Ceftriaxone (11/10)  Fluconazole (11/10>>  Ceftazidime (11/10>>   Vancomycin (11/10>>   Subjective: Patient reports some back pain. Otherwise, no other complaints.  Objective: Vitals:   07/26/16 1950 07/27/16 0447 07/27/16 1345 07/27/16 1414  BP: (!) 102/53 114/65 (!) 100/55 (!) 104/59  Pulse: 72 72 70 69  Resp: 16 16 16 17   Temp: 98.2 F (36.8 C) 98.5 F (36.9 C) 99.5 F (37.5 C) 99.4 F (37.4 C)  TempSrc: Oral Oral Oral Oral  SpO2: 99% 95% 95% 96%    Intake/Output Summary (Last 24 hours) at 07/27/16 1531 Last data filed at  07/27/16 1345  Gross per 24 hour  Intake              575 ml  Output             1450 ml  Net             -875 ml   There were no vitals filed for this visit.  Examination:  General exam: Appears calm and comfortable Respiratory system: Clear to auscultation. Respiratory effort normal. Cardiovascular system: S1 & S2 heard, RRR. No murmurs, rubs, gallops or clicks. Gastrointestinal system: Abdomen is nondistended, soft and nontender. Normal bowel sounds heard. Central nervous system: Alert and oriented. No focal neurological deficits. 4/5 LE strength Extremities: No edema. No calf tenderness Skin: No cyanosis. No rashes Psychiatry: Judgement and insight appear normal. Mood & affect appropriate.     Data Reviewed: I have personally reviewed following labs and imaging studies  CBC:  Recent Labs Lab 07/21/16 0500 07/25/16 1821 07/25/16 2031 07/26/16 0327 07/27/16 0457  WBC 5.9  --   --  11.9* 9.5  HGB 8.1* 10.9* 10.2* 9.2* 7.5*  HCT 26.4* 32.0* 30.0* 29.6* 24.8*  MCV 76.7*  --   --  77.1* 78.7  PLT 271  --   --  [id number]   Basic Metabolic Panel:  Recent Labs Lab 07/21/16 0500 07/24/16 1155 07/25/16 1821 07/25/16 2031 07/26/16 0327  NA 141 140 140 139 137  K 3.7 4.1 4.2 4.3 4.3  CL 110 104  --   --  103  CO2 26 28  --   --  25  GLUCOSE 93 96 157* 191* 160*  BUN 8 10  --   --  10  CREATININE 0.80 0.89  --   --  0.78  CALCIUM 8.3* 8.6*  --   --  8.4*   GFR: Estimated Creatinine Clearance: 90.3 mL/min (by C-G formula based on SCr of 0.78 mg/dL). Liver Function Tests:  Recent Labs Lab 07/21/16 0500  AST 16  ALT 14*  ALKPHOS 67  BILITOT 0.7  PROT 5.7*  ALBUMIN 2.4*   No results for input(s): LIPASE, AMYLASE in the last 168 hours. No results for input(s): AMMONIA in the last 168 hours. Coagulation Profile:  Recent Labs Lab 07/21/16 0500  INR 1.11   Cardiac Enzymes: No results for input(s): CKTOTAL, CKMB, CKMBINDEX, TROPONINI in the last 168  hours. BNP (last 3 results) No results for input(s): PROBNP in the last 8760 hours. HbA1C: No results for input(s): HGBA1C in the last 72 hours. CBG: No results for input(s): GLUCAP in the last 168 hours. Lipid Profile: No results for input(s): CHOL, HDL, LDLCALC, TRIG, CHOLHDL, LDLDIRECT in the last 72 hours. Thyroid Function Tests: No results for input(s): TSH, T4TOTAL, FREET4, T3FREE, THYROIDAB in the last 72 hours. Anemia Panel: No results for input(s): VITAMINB12, FOLATE, FERRITIN, TIBC, IRON, RETICCTPCT in the last 72 hours. Sepsis Labs:  Recent Labs Lab 07/20/16 2145  LATICACIDVEN 0.77    Recent Results (from the past 240 hour(s))  Culture, blood (routine x 2)     Status: None   Collection Time: 07/20/16 11:53 PM  Result Value Ref Range Status   Specimen Description BLOOD LEFT ARM  Final   Special Requests BOTTLES DRAWN AEROBIC AND ANAEROBIC 6ML  Final   Culture NO GROWTH 5 DAYS  Final   Report Status 07/26/2016 FINAL  Final  Culture, blood (routine x 2)     Status: None   Collection Time: 07/20/16 11:59 PM  Result Value Ref Range Status   Specimen Description BLOOD LEFT HAND  Final   Special Requests BOTTLES DRAWN AEROBIC AND ANAEROBIC 6ML  Final   Culture NO GROWTH 5 DAYS  Final   Report Status 07/26/2016 FINAL  Final  MRSA PCR Screening     Status: None   Collection Time: 07/21/16  4:43 AM  Result Value Ref Range Status   MRSA by PCR NEGATIVE NEGATIVE Final    Comment:        The GeneXpert MRSA Assay (FDA approved for NASAL specimens only), is one component of a comprehensive MRSA colonization surveillance program. It is not intended to diagnose MRSA infection nor to guide or monitor treatment for MRSA infections.   Surgical PCR screen     Status: None   Collection Time: 07/23/16 12:00 AM  Result Value Ref Range Status   MRSA, PCR NEGATIVE NEGATIVE Final   Staphylococcus aureus NEGATIVE NEGATIVE Final    Comment:        The Xpert SA Assay  (FDA approved for NASAL specimens in patients over 87 years of age), is one component of a comprehensive surveillance program.  Test performance has been validated by [facility] for patients greater than or equal to 4 year old. It is not intended to diagnose infection nor to guide or monitor treatment.   Aerobic/Anaerobic Culture (surgical/deep wound)     Status: None (Preliminary result)   Collection Time: 07/25/16  3:02 PM  Result Value Ref Range Status   Specimen Description TISSUE  Final   Special Requests SWAB OF LUMBAR EPIDURAL  Final   Gram Stain   Final    MODERATE WBC PRESENT, PREDOMINANTLY PMN MODERATE  GRAM POSITIVE COCCI IN PAIRS FEW GRAM VARIABLE ROD MODERATE GRAM POSITIVE RODS RARE GRAM NEGATIVE RODS    Culture   Final    NO GROWTH 2 DAYS NO ANAEROBES ISOLATED; CULTURE IN PROGRESS FOR 5 DAYS   Report Status PENDING  Incomplete  Acid Fast Smear (AFB)     Status: None   Collection Time: 07/25/16  3:02 PM  Result Value Ref Range Status   AFB Specimen Processing Concentration  Final   Acid Fast Smear Negative  Final    Comment: (NOTE) Performed At: [facility] [place], [place] [id number] Lindon Romp MD [id number]    Source (AFB) SWAB OF SOFT TISSUE LUMBAR EPIDUIRAL  Corrected    Comment: CORRECTED ON 11/14 AT [id number]: PREVIOUSLY REPORTED AS SWAB OF LUMBAR EPIDUIRAL  Aerobic/Anaerobic Culture (surgical/deep wound)     Status: None (Preliminary result)   Collection Time: 07/25/16  5:02 PM  Result Value Ref Range Status   Specimen Description TISSUE  Final   Special Requests L4 L4 INTERVERTEBRAL DISC  Final   Gram Stain   Final    FEW WBC PRESENT, PREDOMINANTLY PMN FEW GRAM NEGATIVE RODS FEW GRAM VARIABLE ROD MODERATE GRAM POSITIVE COCCI IN PAIRS FEW GRAM POSITIVE RODS    Culture   Final    NO GROWTH 2 DAYS NO ANAEROBES ISOLATED; CULTURE IN PROGRESS FOR 5 DAYS   Report Status PENDING  Incomplete  Acid Fast Smear (AFB)      Status: None   Collection Time: 07/25/16  5:02 PM  Result Value Ref Range Status   AFB Specimen Processing Comment  Final    Comment: Tissue Grinding and Digestion/Decontamination   Acid Fast Smear Negative  Final    Comment: (NOTE) Performed At: [facility] [place], [place] [id number] Lindon Romp MD [id number]    Source (AFB) TISSUE  Final    Comment: L4,L5 INTERVERTEBRAL DISC         Radiology Studies: Dg Lumbar Spine 2-3 Views  Result Date: 07/25/2016 CLINICAL DATA:  Lumbar spinal fixation at L3-S1.  Initial encounter. EXAM: LUMBAR SPINE - 2-3 VIEW COMPARISON:  None. FINDINGS: Two fluoroscopic C-arm images are provided from the OR, demonstrating lumbar spinal fusion at L3-S1. Underlying decompression is noted. The patient is status post sacroiliac spinal fusion. IMPRESSION: Status post lumbar spinal fusion at L3-S1. Electronically Signed   By: Garald Balding M.D.   On: 07/25/2016 22:45   Dg C-arm Gt 120 Min  Result Date: 07/25/2016 CLINICAL DATA:  Lumbar spinal fixation at L3-S1.  Initial encounter. EXAM: LUMBAR SPINE - 2-3 VIEW COMPARISON:  None. FINDINGS: Two fluoroscopic C-arm images are provided from the OR, demonstrating lumbar spinal fusion at L3-S1. Underlying decompression is noted. The patient is status post sacroiliac spinal fusion. IMPRESSION: Status post lumbar spinal fusion at L3-S1. Electronically Signed   By: Garald Balding M.D.   On: 07/25/2016 22:45        Scheduled Meds: . sodium chloride   Intravenous Once  . sodium chloride   Intravenous Once  . cefTAZidime (FORTAZ)  IV  2 g Intravenous Q8H  . ferrous sulfate  325 mg Oral BID WC  . fluconazole  100 mg Oral Daily  . mouth rinse  15 mL Mouth Rinse BID  . multivitamin with minerals  1 tablet Oral Daily  . oxyCODONE  20 mg Oral Q12H  . polyethylene glycol  17 g Oral Daily  . senna-docusate  1 tablet Oral BID  .  sodium chloride flush  3 mL Intravenous Q12H  . sodium  chloride flush  3 mL Intravenous Q12H  . vancomycin  1,000 mg Intravenous Q12H   Continuous Infusions: . sodium chloride 75 mL/hr at 07/27/16 0016  . sodium chloride       LOS: 7 days     Cordelia Poche [facility] 07/27/2016, 3:31 PM Pager: [phone]  If 7PM-7AM, please contact night-coverage [email or web address] Password [email or web address] 07/27/2016, 3:31 PM

## 2016-07-27 NOTE — Progress Notes (Signed)
    Patient doing well Leg pain continues to be improved + LBP Has been OOB with nursing and PT Felt dizzy yesterday when up, likely due to anemia   Physical Exam: Vitals:   07/26/16 1950 07/27/16 0447  BP: (!) 102/53 114/65  Pulse: 72 72  Resp: 16 16  Temp: 98.2 F (36.8 C) 98.5 F (36.9 C)    Dressing in place NVI  Hg decreased to 7.5 Drain output 50/10 hours  POD #2 s/p L3-S1 fusion with I and D of infection  - up with PT/OT, encourage ambulation. Patient must be OOB q shift.  - abx per ID recs - patient's custom brace is to be delivered today - patient may be discharged home once pain controlled adequately and once ID recs are made final, and after PT feels patient is able to care for himself at home - maintain drain - patient will require 2 units of prbcs for low hg

## 2016-07-27 NOTE — Progress Notes (Addendum)
Pharmacy Antibiotic Note  Donald [name] is a 75 y.o. male admitted on 07/20/2016 with lumbar back pain.  Pt's problems stem from a lumbar decompression completed in June of this year. He was on vancomycin and ceftriaxone at home for discitis - stop date was to be on 12/13. His lumbar wound grew out propionibacterium, however his antibiotics were broadened over the weekend due to worsening infection to vanc/ceftazidime.   11/14 gram stains positive for GNR, GVR, GPcocci in pairs but cultures are no growth x 2 days.  Currently afebrile, WBC wnl. Vancomycin trough today was drawn a little late, drawn at 1400 and ordered for 1330.  Vancomycin trough was 19 mcg/ml - within desired goal of 15 -20 mcg/ml.    Plan: Continue vancomycin 1 gm IV q12h with trough goal 15- 20 mcg/ml  Continue ceftazidime 2g/8h   Follow-up renal function, clinical progress, trough as clinically indicated   Temp (24hrs), Avg:98.8 F (37.1 C), Min:98.2 F (36.8 C), Max:99.5 F (37.5 C)   Recent Labs Lab 07/20/16 1509 07/20/16 1517 07/20/16 2145 07/21/16 0500 07/21/16 0930 07/24/16 1155 07/26/16 0327 07/27/16 0457 07/27/16 1358  WBC 6.5  --   --  5.9  --   --  11.9* 9.5  --   CREATININE 0.75  --   --  0.80  --  0.89 0.78  --   --   LATICACIDVEN  --  0.56 0.77  --   --   --   --   --   --   VANCOTROUGH  --   --   --   --  18  --   --   --  19    Estimated Creatinine Clearance: 90.3 mL/min (by C-G formula based on SCr of 0.78 mg/dL).    Allergies  Allergen Reactions  . Iodine Anaphylaxis  . Ivp Dye [Iodinated Diagnostic Agents] Anaphylaxis    Antimicrobials this admission:  Vancomycin 10/19 >> (12/13) Ceftriaxone 10/20 >> 10/21, 10/23 >> 11/10 Ceftazidime 11/10>> (12/13) Fluconazole 11/10  Dose adjustments this admission:  10/23 VT 12 on 750mg  q12, increase to 1g q12 11/10 VT 18 on 1g q12 - cont 11/16 VT 19 on 1 gm q12 ( VT drawn 1 hr after dose due)   Microbiology results:  10/19 Lumbar wound:  Propionibacterium acnes 11/12 MRSA PCR: negative  11/14 tissue intervertebral disc: gram stain: few GNR, few GVR, mod GPcocc in pairs, few GRP No growth x 2 days, AFB neg 11/14 swab of lumbar epidural: gram stain: mod GPcocci in pairs, rare GNRs, no growth 2 days  Thank you for allowing pharmacy to be a part of this patient's care.  Eudelia Bunch, Pharm.D. [id number] 07/27/2016 2:58 PM

## 2016-07-27 NOTE — Progress Notes (Signed)
Pt received his TLSO Left leg extension today,Called [facility] regarding PT 2x daily he stated  they generally only do 1 pt a day for back patients

## 2016-07-27 NOTE — Progress Notes (Signed)
PT Cancellation Note  Patient Details Name: Donald [name] MRN: [id number] DOB: [birth year]   Cancelled Treatment:    Reason Eval/Treat Not Completed: Other (comment) (Pts brace has not been delivered.  Await brace.)Thanks.   Godfrey Pick Gregoria Selvy 07/27/2016, 11:08 AM [facility] [phone] (pager)

## 2016-07-27 NOTE — Progress Notes (Signed)
Rehab Admissions Coordinator Note:  Patient was screened by Retta Diones for appropriateness for an Inpatient Acute Rehab Consult.  Received a call from PT saying that patient will need inpatient rehab now.  At this time, we are recommending Inpatient Rehab consult.  Retta Diones 07/27/2016, 1:36 PM  I can be reached at [phone].

## 2016-07-27 NOTE — Progress Notes (Signed)
Reviewed blood transfusion signs and symptoms, pt states he has had no problem in the past

## 2016-07-27 NOTE — Progress Notes (Signed)
[facility] Infectious [facility]   Reason for visit: Follow up on discitis with osteomyelitis and abscess, L4-5, day #7 abtx  Interval History: s/p OR debridement on 11/14, revision decompression, debridement, fusion Afebrile  24hr: gram stain suggests polymicrobial. Denies back pain  Physical Exam: Constitutional:  Vitals:   07/27/16 1345 07/27/16 1414  BP: (!) 100/55 (!) 104/59  Pulse: 70 69  Resp: 16 17  Temp: 99.5 F (37.5 C) 99.4 F (37.4 C)   patient appears in NAD Respiratory: Normal respiratory effort; CTA B Back: dressing in place. jp drain with serosanginous fluid Cardiovascular: RRR GI: soft, nt  Review of Systems: Constitutional: negative for fevers and chills Gastrointestinal: negative for diarrhea  Lab Results  Component Value Date   WBC 9.5 07/27/2016   HGB 7.5 (L) 07/27/2016   HCT 24.8 (L) 07/27/2016   MCV 78.7 07/27/2016   PLT 255 07/27/2016    Lab Results  Component Value Date   CREATININE 0.78 07/26/2016   BUN 10 07/26/2016   NA 137 07/26/2016   K 4.3 07/26/2016   CL 103 07/26/2016   CO2 25 07/26/2016    Lab Results  Component Value Date   ALT 14 (L) 07/21/2016   AST 16 07/21/2016   ALKPHOS 67 07/21/2016     Microbiology: Recent Results (from the past 240 hour(s))  Culture, blood (routine x 2)     Status: None   Collection Time: 07/20/16 11:53 PM  Result Value Ref Range Status   Specimen Description BLOOD LEFT ARM  Final   Special Requests BOTTLES DRAWN AEROBIC AND ANAEROBIC 6ML  Final   Culture NO GROWTH 5 DAYS  Final   Report Status 07/26/2016 FINAL  Final  Culture, blood (routine x 2)     Status: None   Collection Time: 07/20/16 11:59 PM  Result Value Ref Range Status   Specimen Description BLOOD LEFT HAND  Final   Special Requests BOTTLES DRAWN AEROBIC AND ANAEROBIC 6ML  Final   Culture NO GROWTH 5 DAYS  Final   Report Status 07/26/2016 FINAL  Final  MRSA PCR Screening     Status: None   Collection Time: 07/21/16   4:43 AM  Result Value Ref Range Status   MRSA by PCR NEGATIVE NEGATIVE Final    Comment:        The GeneXpert MRSA Assay (FDA approved for NASAL specimens only), is one component of a comprehensive MRSA colonization surveillance program. It is not intended to diagnose MRSA infection nor to guide or monitor treatment for MRSA infections.   Surgical PCR screen     Status: None   Collection Time: 07/23/16 12:00 AM  Result Value Ref Range Status   MRSA, PCR NEGATIVE NEGATIVE Final   Staphylococcus aureus NEGATIVE NEGATIVE Final    Comment:        The Xpert SA Assay (FDA approved for NASAL specimens in patients over 15 years of age), is one component of a comprehensive surveillance program.  Test performance has been validated by [facility] for patients greater than or equal to 42 year old. It is not intended to diagnose infection nor to guide or monitor treatment.   Aerobic/Anaerobic Culture (surgical/deep wound)     Status: None (Preliminary result)   Collection Time: 07/25/16  3:02 PM  Result Value Ref Range Status   Specimen Description TISSUE  Final   Special Requests SWAB OF LUMBAR EPIDURAL  Final   Gram Stain   Final    MODERATE  WBC PRESENT, PREDOMINANTLY PMN MODERATE GRAM POSITIVE COCCI IN PAIRS FEW GRAM VARIABLE ROD MODERATE GRAM POSITIVE RODS RARE GRAM NEGATIVE RODS    Culture   Final    NO GROWTH 2 DAYS NO ANAEROBES ISOLATED; CULTURE IN PROGRESS FOR 5 DAYS   Report Status PENDING  Incomplete  Aerobic/Anaerobic Culture (surgical/deep wound)     Status: None (Preliminary result)   Collection Time: 07/25/16  5:02 PM  Result Value Ref Range Status   Specimen Description TISSUE  Final   Special Requests L4 L4 INTERVERTEBRAL DISC  Final   Gram Stain   Final    FEW WBC PRESENT, PREDOMINANTLY PMN FEW GRAM NEGATIVE RODS FEW GRAM VARIABLE ROD MODERATE GRAM POSITIVE COCCI IN PAIRS FEW GRAM POSITIVE RODS    Culture   Final    NO GROWTH 2 DAYS NO ANAEROBES  ISOLATED; CULTURE IN PROGRESS FOR 5 DAYS   Report Status PENDING  Incomplete  Acid Fast Smear (AFB)     Status: None   Collection Time: 07/25/16  5:02 PM  Result Value Ref Range Status   AFB Specimen Processing Comment  Final    Comment: Tissue Grinding and Digestion/Decontamination   Acid Fast Smear Negative  Final    Comment: (NOTE) Performed At: [facility] [place], [place] [id number] Lindon Romp MD [id number]    Source (AFB) TISSUE  Final    Comment: L4,L5 INTERVERTEBRAL DISC    Impression/Plan:  1. Discitis, osteomyelitis and abscess - Proprionibacterium from the first I x D of epidural abscess though had clinical worsening and required 2nd I x D on 11/14. multiple appropriate cultures have been sent from OR. Gram stain showing polymicrobial. We have expanded his coverage to vancomycin and ceftazidime. Await culture results  2. Post operative anemia = receiving blood transfusion

## 2016-07-27 NOTE — Progress Notes (Signed)
Orthopedic Tech Progress Note Patient Details:  Donald [name] August 14, 1941 [id number] Brace completed by bio-tech vendor Patient ID: Donald [name], male   DOB: 1940/12/18, 75 y.o.   MRN: [id number]   [name] 07/27/2016, 3:04 PM

## 2016-07-28 DIAGNOSIS — D62 Acute posthemorrhagic anemia: Secondary | ICD-10-CM

## 2016-07-28 DIAGNOSIS — Z981 Arthrodesis status: Secondary | ICD-10-CM

## 2016-07-28 LAB — HEMOGLOBIN AND HEMATOCRIT, BLOOD
HEMATOCRIT: 28.7 % — AB (ref 39.0–52.0)
HEMOGLOBIN: 9 g/dL — AB (ref 13.0–17.0)

## 2016-07-28 LAB — TYPE AND SCREEN
ABO/RH(D): A NEG
Antibody Screen: NEGATIVE
UNIT DIVISION: 0
UNIT DIVISION: 0
Unit division: 0
Unit division: 0
Unit division: 0
Unit division: 0

## 2016-07-28 MED ORDER — BISACODYL 10 MG RE SUPP
10.0000 mg | Freq: Once | RECTAL | Status: AC
  Administered 2016-07-28: 10 mg via RECTAL
  Filled 2016-07-28: qty 1

## 2016-07-28 MED ORDER — POLYETHYLENE GLYCOL 3350 17 G PO PACK
17.0000 g | PACK | Freq: Two times a day (BID) | ORAL | Status: DC
  Administered 2016-07-28 – 2016-07-31 (×4): 17 g via ORAL
  Filled 2016-07-28 (×6): qty 1

## 2016-07-28 NOTE — Progress Notes (Signed)
Pt JP drain has 150 cc output total on my shift, pt refused to take out his JP drain, stats he felt he was not ready to pull out the drain. This RN passed the report to night RN, will monitor pt closely.

## 2016-07-28 NOTE — Progress Notes (Signed)
    Patient doing well Ambulated yesterday   Physical Exam: Vitals:   07/27/16 1955 07/28/16 0500  BP: 124/64 123/75  Pulse: 67 66  Resp: 18 18  Temp: 99.3 F (37.4 C) 98.3 F (36.8 C)    Dressing in place NVI  Drain output not recorded, output looks minimal  POD #3 s/p I and D and fusion L3-pelvis  - up with PT/OT, encourage ambulation - Percocet for pain, Valium for muscle spasms - transfer to CIR when bed available - dressing to be removed on Sunday - d/c drain this afternoon

## 2016-07-28 NOTE — Care Management Important Message (Signed)
Important Message  Patient Details  Name: Donald [name] MRN: [id number] Date of Birth: Oct 02, 1940   Medicare Important Message Given:  Yes    Orbie Pyo 07/28/2016, 3:13 PM

## 2016-07-28 NOTE — Progress Notes (Signed)
Inpatient Rehabilitation  Received notification from [facility] that insurance will not have a decision until Monday 07/31/16.  I have notified patient and spouse.  Plan for my co-worker Gerlean Ren to follow up Monday in hopes of admitting patient.  Please call with questions.  Carmelia Roller., CCC/SLP Admission Coordinator  [place]  Cell [phone]

## 2016-07-28 NOTE — Progress Notes (Signed)
PROGRESS NOTE    Donald [name]  [id number] DOB: May 20, 1941 DOA: 07/20/2016 PCP: [name], MD   Brief Narrative: Donald [name] is a 75 y.o. male with a history of prostate cancer s/p prostatectomy and spinal stenosis s/p L3-4, L4-5 decompression. Patient presented with left hip pain and weakness and found to have a recurrent epidural abscess.   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 -continue antibiotics per ID recommendations -follow-up tissue cultures, gram stain, AFB culture -continue analgesics prn -PT recommendations: CIR (awaiting approval from insurance)  Candidiasis -continue fluconazole per ID  Microcytic anemia Stable   DVT prophylaxis: SCDs Code Status: Full code Family Communication: Wife at bedside Disposition Plan: Discharge pending infectious disease recommendations for outpatient antibiotic regimen   Consultants:   Infectious disease  Orthopedic surgery  Procedures:   Ddebridement, laminectomy, posterior spinal fusion (11/14)  Antimicrobials:  Ceftriaxone (11/10)  Fluconazole (11/10>>  Ceftazidime (11/10>>   Vancomycin (11/10>>11/16)   Subjective: Back pain improved today.  Objective: Vitals:   07/27/16 1742 07/27/16 1955 07/28/16 0500 07/28/16 1500  BP: (!) 115/59 124/64 123/75 118/70  Pulse: 69 67 66 63  Resp: 18 18 18 18   Temp: 99.6 F (37.6 C) 99.3 F (37.4 C) 98.3 F (36.8 C) 98.3 F (36.8 C)  TempSrc: Oral Oral Oral Oral  SpO2: 95% 94% 96% 97%    Intake/Output Summary (Last 24 hours) at 07/28/16 1539 Last data filed at  07/28/16 1500  Gross per 24 hour  Intake          4906.25 ml  Output             1921 ml  Net          2985.25 ml   There were no vitals filed for this visit.  Examination:  General exam: Appears calm and comfortable Respiratory system: Clear to auscultation. Respiratory effort normal. Cardiovascular system: S1 & S2 heard, RRR. No murmurs, rubs, gallops or clicks. Gastrointestinal system: Abdomen is nondistended, soft and nontender. Normal bowel sounds heard. Central nervous system: Alert and oriented. No focal neurological deficits. 4/5 LE strength Extremities: No edema. No calf tenderness Skin: No cyanosis. No rashes Psychiatry: Judgement and insight appear normal. Mood & affect appropriate.     Data Reviewed: I have personally reviewed following labs and imaging studies  CBC:  Recent Labs Lab 07/25/16 1821 07/25/16 2031 07/26/16 0327 07/27/16 0457 07/28/16 0839  WBC  --   --  11.9* 9.5  --   HGB 10.9* 10.2* 9.2* 7.5* 9.0*  HCT 32.0* 30.0* 29.6* 24.8* 28.7*  MCV  --   --  77.1* 78.7  --   PLT  --   --  303 255  --    Basic Metabolic Panel:  Recent Labs Lab 07/24/16 1155 07/25/16 1821 07/25/16 2031 07/26/16 0327  NA 140 140 139 137  K 4.1 4.2 4.3 4.3  CL 104  --   --  103  CO2 28  --   --  25  GLUCOSE 96 157* 191* 160*  BUN 10  --   --  10  CREATININE 0.89  --   --  0.78  CALCIUM 8.6*  --   --  8.4*   GFR: Estimated Creatinine Clearance: 90.3 mL/min (by C-G formula based on SCr of 0.78 mg/dL). Liver Function Tests: No results for input(s): AST, ALT, ALKPHOS, BILITOT, PROT, ALBUMIN in the last 168 hours. No results for input(s): LIPASE, AMYLASE in the last 168 hours. No results for input(s): AMMONIA in the last 168 hours. Coagulation Profile: No results for input(s): INR, PROTIME in the last 168 hours. Cardiac Enzymes: No results for input(s): CKTOTAL, CKMB, CKMBINDEX, TROPONINI in the last 168 hours. BNP (last 3 results) No results for input(s):  PROBNP in the last 8760 hours. HbA1C: No results for input(s): HGBA1C in the last 72 hours. CBG: No results for input(s): GLUCAP in the last 168 hours. Lipid Profile: No results for input(s): CHOL, HDL, LDLCALC, TRIG, CHOLHDL, LDLDIRECT in the last 72 hours. Thyroid Function Tests: No results for input(s): TSH, T4TOTAL, FREET4, T3FREE, THYROIDAB in the last 72 hours. Anemia Panel: No results for input(s): VITAMINB12, FOLATE, FERRITIN, TIBC, IRON, RETICCTPCT in the last 72 hours. Sepsis Labs: No results for input(s): PROCALCITON, LATICACIDVEN in the last 168 hours.  Recent Results (from the past 240 hour(s))  Culture, blood (routine x 2)     Status: None   Collection Time: 07/20/16 11:53 PM  Result Value Ref Range Status   Specimen Description BLOOD LEFT ARM  Final   Special Requests BOTTLES DRAWN AEROBIC AND ANAEROBIC 6ML  Final   Culture NO GROWTH 5 DAYS  Final   Report Status 07/26/2016 FINAL  Final  Culture, blood (routine x 2)     Status: None   Collection Time: 07/20/16 11:59 PM  Result Value Ref Range Status   Specimen Description BLOOD LEFT HAND  Final   Special Requests BOTTLES DRAWN AEROBIC AND ANAEROBIC 6ML  Final   Culture NO GROWTH 5 DAYS  Final   Report Status 07/26/2016 FINAL  Final  MRSA PCR Screening     Status: None   Collection Time: 07/21/16  4:43 AM  Result Value Ref Range Status   MRSA by PCR NEGATIVE NEGATIVE Final    Comment:        The GeneXpert MRSA Assay (FDA approved for NASAL specimens only), is one component of a comprehensive MRSA colonization surveillance program. It is not intended to diagnose MRSA infection nor to guide or monitor treatment for MRSA infections.   Surgical PCR screen     Status: None   Collection Time: 07/23/16 12:00 AM  Result Value Ref Range Status   MRSA, PCR NEGATIVE NEGATIVE Final   Staphylococcus aureus NEGATIVE NEGATIVE Final    Comment:        The Xpert SA Assay (FDA approved for NASAL specimens in patients  over 67 years of age), is one component of a comprehensive surveillance program.  Test performance has been validated by [facility] for patients greater than or equal to 48 year old. It is not intended to diagnose infection nor to guide or monitor treatment.   Aerobic/Anaerobic Culture (surgical/deep wound)     Status: None (Preliminary result)   Collection Time: 07/25/16  3:02 PM  Result Value Ref Range Status   Specimen Description TISSUE  Final   Special Requests SWAB OF LUMBAR EPIDURAL  Final   Gram Stain   Final    MODERATE WBC PRESENT, PREDOMINANTLY PMN MODERATE GRAM POSITIVE COCCI IN PAIRS FEW GRAM VARIABLE ROD MODERATE GRAM POSITIVE RODS RARE GRAM NEGATIVE RODS    Culture  Final    NO GROWTH 3 DAYS NO ANAEROBES ISOLATED; CULTURE IN PROGRESS FOR 5 DAYS   Report Status PENDING  Incomplete  Acid Fast Smear (AFB)     Status: None   Collection Time: 07/25/16  3:02 PM  Result Value Ref Range Status   AFB Specimen Processing Concentration  Final   Acid Fast Smear Negative  Final    Comment: (NOTE) Performed At: [facility] [place], [place] [id number] Lindon Romp MD [id number]    Source (AFB) SWAB OF SOFT TISSUE LUMBAR EPIDUIRAL  Corrected    Comment: CORRECTED ON 11/14 AT [id number]: PREVIOUSLY REPORTED AS SWAB OF LUMBAR EPIDUIRAL  Aerobic/Anaerobic Culture (surgical/deep wound)     Status: None (Preliminary result)   Collection Time: 07/25/16  5:02 PM  Result Value Ref Range Status   Specimen Description TISSUE  Final   Special Requests L4 L4 INTERVERTEBRAL DISC  Final   Gram Stain   Final    FEW WBC PRESENT, PREDOMINANTLY PMN FEW GRAM NEGATIVE RODS FEW GRAM VARIABLE ROD MODERATE GRAM POSITIVE COCCI IN PAIRS FEW GRAM POSITIVE RODS    Culture   Final    NO GROWTH 3 DAYS NO ANAEROBES ISOLATED; CULTURE IN PROGRESS FOR 5 DAYS   Report Status PENDING  Incomplete  Acid Fast Smear (AFB)     Status: None   Collection Time: 07/25/16  5:02 PM    Result Value Ref Range Status   AFB Specimen Processing Comment  Final    Comment: Tissue Grinding and Digestion/Decontamination   Acid Fast Smear Negative  Final    Comment: (NOTE) Performed At: [facility] [address] [place], [place] [id number] Lindon Romp MD [id number]    Source (AFB) TISSUE  Final    Comment: L4,L5 INTERVERTEBRAL [place]         Radiology Studies: No results found.      Scheduled Meds: . sodium chloride   Intravenous Once  . cefTAZidime (FORTAZ)  IV  2 g Intravenous Q8H  . ferrous sulfate  325 mg Oral BID WC  . fluconazole  100 mg Oral Daily  . mouth rinse  15 mL Mouth Rinse BID  . multivitamin with minerals  1 tablet Oral Daily  . oxyCODONE  20 mg Oral Q12H  . polyethylene glycol  17 g Oral BID  . senna-docusate  1 tablet Oral BID  . sodium chloride flush  3 mL Intravenous Q12H  . sodium chloride flush  3 mL Intravenous Q12H  . vancomycin  1,000 mg Intravenous Q12H   Continuous Infusions: . sodium chloride 75 mL/hr at 07/28/16 0251  . sodium chloride       LOS: 8 days     Cordelia Poche [facility] 07/28/2016, 3:39 PM Pager: [phone]  If 7PM-7AM, please contact night-coverage [email or web address] Password [facility] 07/28/2016, 3:39 PM

## 2016-07-28 NOTE — Progress Notes (Signed)
[facility]   Reason for visit: Follow up on discitis with osteomyelitis and abscess, L4-5, day #8 abtx  Interval History: s/p OR debridement on 11/14, revision decompression, debridement, fusion Afebrile  24hr: gram stain suggests polymicrobial. Ambulating without much difficulty  Physical Exam: Constitutional:  Vitals:   07/27/16 1955 07/28/16 0500  BP: 124/64 123/75  Pulse: 67 66  Resp: 18 18  Temp: 99.3 F (37.4 C) 98.3 F (36.8 C)   patient appears in NAD Respiratory: Normal respiratory effort; CTA B Back: dressing in place. jp drain with serosanginous fluid Cardiovascular: RRR GI: soft, nt  Review of Systems: Constitutional: negative for fevers and chills Gastrointestinal: negative for diarrhea  Lab Results  Component Value Date   WBC 9.5 07/27/2016   HGB 9.0 (L) 07/28/2016   HCT 28.7 (L) 07/28/2016   MCV 78.7 07/27/2016   PLT 255 07/27/2016    Lab Results  Component Value Date   CREATININE 0.78 07/26/2016   BUN 10 07/26/2016   NA 137 07/26/2016   K 4.3 07/26/2016   CL 103 07/26/2016   CO2 25 07/26/2016    Lab Results  Component Value Date   ALT 14 (L) 07/21/2016   AST 16 07/21/2016   ALKPHOS 67 07/21/2016     Microbiology: Recent Results (from the past 240 hour(s))  Culture, blood (routine x 2)     Status: None   Collection Time: 07/20/16 11:53 PM  Result Value Ref Range Status   Specimen Description BLOOD LEFT ARM  Final   Special Requests BOTTLES DRAWN AEROBIC AND ANAEROBIC 6ML  Final   Culture NO GROWTH 5 DAYS  Final   Report Status 07/26/2016 FINAL  Final  Culture, blood (routine x 2)     Status: None   Collection Time: 07/20/16 11:59 PM  Result Value Ref Range Status   Specimen Description BLOOD LEFT HAND  Final   Special Requests BOTTLES DRAWN AEROBIC AND ANAEROBIC 6ML  Final   Culture NO GROWTH 5 DAYS  Final   Report Status 07/26/2016 FINAL  Final  MRSA PCR Screening     Status: None   Collection Time:  07/21/16  4:43 AM  Result Value Ref Range Status   MRSA by PCR NEGATIVE NEGATIVE Final    Comment:        The GeneXpert MRSA Assay (FDA approved for NASAL specimens only), is one component of a comprehensive MRSA colonization surveillance program. It is not intended to diagnose MRSA infection nor to guide or monitor treatment for MRSA infections.   Surgical PCR screen     Status: None   Collection Time: 07/23/16 12:00 AM  Result Value Ref Range Status   MRSA, PCR NEGATIVE NEGATIVE Final   Staphylococcus aureus NEGATIVE NEGATIVE Final    Comment:        The Xpert SA Assay (FDA approved for NASAL specimens in patients over 74 years of age), is one component of a comprehensive surveillance program.  Test performance has been validated by [facility] for patients greater than or equal to 19 year old. It is not intended to diagnose infection nor to guide or monitor treatment.   Aerobic/Anaerobic Culture (surgical/deep wound)     Status: None (Preliminary result)   Collection Time: 07/25/16  3:02 PM  Result Value Ref Range Status   Specimen Description TISSUE  Final   Special Requests SWAB OF LUMBAR EPIDURAL  Final   Gram Stain   Final    MODERATE WBC  PRESENT, PREDOMINANTLY PMN MODERATE GRAM POSITIVE COCCI IN PAIRS FEW GRAM VARIABLE ROD MODERATE GRAM POSITIVE RODS RARE GRAM NEGATIVE RODS    Culture   Final    NO GROWTH 3 DAYS NO ANAEROBES ISOLATED; CULTURE IN PROGRESS FOR 5 DAYS   Report Status PENDING  Incomplete  Acid Fast Smear (AFB)     Status: None   Collection Time: 07/25/16  3:02 PM  Result Value Ref Range Status   AFB Specimen Processing Concentration  Final   Acid Fast Smear Negative  Final    Comment: (NOTE) Performed At: [facility] [place], [place] [id number] Lindon Romp MD [id number]    Source (AFB) SWAB OF SOFT TISSUE LUMBAR EPIDUIRAL  Corrected    Comment: CORRECTED ON 11/14 AT [id number]: PREVIOUSLY REPORTED AS SWAB OF  LUMBAR EPIDUIRAL  Aerobic/Anaerobic Culture (surgical/deep wound)     Status: None (Preliminary result)   Collection Time: 07/25/16  5:02 PM  Result Value Ref Range Status   Specimen Description TISSUE  Final   Special Requests L4 L4 INTERVERTEBRAL DISC  Final   Gram Stain   Final    FEW WBC PRESENT, PREDOMINANTLY PMN FEW GRAM NEGATIVE RODS FEW GRAM VARIABLE ROD MODERATE GRAM POSITIVE COCCI IN PAIRS FEW GRAM POSITIVE RODS    Culture   Final    NO GROWTH 3 DAYS NO ANAEROBES ISOLATED; CULTURE IN PROGRESS FOR 5 DAYS   Report Status PENDING  Incomplete  Acid Fast Smear (AFB)     Status: None   Collection Time: 07/25/16  5:02 PM  Result Value Ref Range Status   AFB Specimen Processing Comment  Final    Comment: Tissue Grinding and Digestion/Decontamination   Acid Fast Smear Negative  Final    Comment: (NOTE) Performed At: [facility] [place], [place] [id number] Lindon Romp MD [id number]    Source (AFB) TISSUE  Final    Comment: L4,L5 INTERVERTEBRAL DISC    Impression/Plan:  1. Discitis, osteomyelitis and abscess - Proprionibacterium from the first I x D of epidural abscess though had clinical worsening and required 2nd I x D on 11/14.   OR cultures are still pending since they are trying to identify the multiple organisms noted on gram stain.  We have expanded his coverage to vancomycin and ceftazidime. Await culture results. Recommend to treat for 8 wk course of IV therapy, using 11/16 as day 1. We will consult "[facility]" pharmacy to help with IV abtx  2. Post operative anemia = improved with 2 u RBC transfusion  I will follow the patient on Monday at [facility] if he is transferred over the weekend. If you have further questions over the wkd, can call Dr Linus Salmons who is covering.  We will arrange for follow up visit at [facility] in 4 wk

## 2016-07-28 NOTE — PMR Pre-admission (Signed)
PMR Admission Coordinator Pre-Admission Assessment  Patient: Donald [name] is an 75 y.o., male MRN: [id number] DOB: [birth year] Height: 6; Weight: 195 lbs.             Insurance Information HMO: X    PPO:      PCP:      IPA:      80/20:      OTHER:  PRIMARY: BCBS Medicare       Policy#: [id number]      Subscriber: Self CM Name: Montine Circle   Phone#: [phone]     Fax#: [phone] Pre-Cert#: [id number], approved 07/31/16 to 08/09/16 with updates due after conference 08/09/16      Employer: Retired Benefits:  Phone #: [phone]     Name: Amy Eff. Date: 09/12/15     Deduct: $0      Out of Pocket Max: $6,700      Life Max: none CIR: $300 a day, days 1-6; $0 a day, days 7+      SNF: $0 a day, days 1-20; $164.50 a day, days 21-100 Outpatient: PT/OT     Co-Pay: $40 a visit  Home Health: PT/OT      Co-Pay: None DME: 80%     Co-Pay: 20% Providers: in-network  Medicaid Application Date:       Case Manager:  Disability Application Date:       Case Worker:   Emergency Facilities manager Information    Name Relation Home Work Mobile   Sollers,Nancy Spouse [phone]  [phone]   Selman, Parziale   [phone]   Khylil, Degner   [phone]     Current Medical History  Patient Admitting Diagnosis: 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 He was admitted for I &D of lumbar wound on 10/19/17and discharged to home with plans for IV Vanc/Ceftriaxone to continue thorough 08/23/16. He was readmitted with 11/9/17with increase in back pain with radiculopathyand decline in mobility. Follow up MRI showed recurrence of abscess and progressive  Discitis and osteomyelitis with destruction of L4 and  L5 vertebral body. Dr. Rolena Infante consulted for second opinion and concurred with recommendations of surgical intervention. Patient underwent revision of L4/5 decompression with I and D of infected material and posterior fusion L3-S1.  Dr. Linus Salmons recommends continuing current antibiotics till cultures finalize. In process of receiving 2 units PRBC for ABLA. TLSO with left leg extension ordered for support and therapy evaluations completed today revealing decline in mobility as well as ability to carry out self care tasks. CIR recommended for follow up therapy and patient admitted       Past Medical History  Past Medical History:  Diagnosis Date  . Adenomatous polyps 10/2002, 06/2009  . Arthritis   . BPH (benign prostatic hyperplasia)   . Hyperlipemia   . Prostate cancer ([place])   . Spinal stenosis   . Wears glasses     Family History  family history includes Cancer in his brother; Diabetes in his mother; Heart disease in his mother; Prostate cancer in his father.  Prior Rehab/Hospitalizations:  Has the patient had major surgery during 100 days prior to admission? Yes  Current Medications   Current Facility-Administered Medications:  .  0.9 %  sodium chloride infusion, 250 mL, Intravenous, PRN, Rexanne Mano, MD, Stopped at  07/25/16 1922 .  0.9 %  sodium chloride infusion, , Intravenous, Once, Josephine Igo, MD .  0.9 %  sodium chloride infusion, , Intravenous, Continuous, Kayla J McKenzie, PA-C, Last Rate: 75 mL/hr at 07/31/16 0006 .  0.9 %  sodium chloride infusion, 250 mL, Intravenous, Continuous, Kayla J McKenzie, PA-C .  acetaminophen (TYLENOL) tablet 650 mg, 650 mg, Oral, Q4H PRN, 650 mg at 07/26/16 1137 **OR** acetaminophen (TYLENOL) suppository 650 mg, 650 mg, Rectal, Q4H PRN, Kayla J McKenzie, PA-C .  alum & mag hydroxide-simeth (MAALOX/MYLANTA) 200-200-20 MG/5ML suspension 30 mL, 30 mL, Oral, Q6H PRN, Lennie Muckle McKenzie, PA-C .  cefTAZidime (FORTAZ) 2 g in dextrose 5 % 50 mL IVPB,  2 g, Intravenous, Q8H, Mariel Aloe, MD, 2 g at 07/31/16 1046 .  diphenhydrAMINE-zinc acetate (BENADRYL) 2-0.1 % cream, , Topical, Daily PRN, Theodis Blaze, MD .  ferrous sulfate tablet 325 mg, 325 mg, Oral, BID WC, Dron Tanna Furry, MD, 325 mg at 07/31/16 0854 .  hydrOXYzine (ATARAX/VISTARIL) tablet 25 mg, 25 mg, Oral, TID PRN, Theodis Blaze, MD, 25 mg at 07/24/16 1531 .  LORazepam (ATIVAN) injection 1 mg, 1 mg, Intravenous, Q4H PRN, Domenic Moras, PA-C, 1 mg at 07/20/16 1827 .  MEDLINE mouth rinse, 15 mL, Mouth Rinse, BID, Theodis Blaze, MD, 15 mL at 07/31/16 1047 .  menthol-cetylpyridinium (CEPACOL) lozenge 3 mg, 1 lozenge, Oral, PRN **OR** phenol (CHLORASEPTIC) mouth spray 1 spray, 1 spray, Mouth/Throat, PRN, Kayla J McKenzie, PA-C .  morphine 2 MG/ML injection 1-4 mg, 1-4 mg, Intravenous, Q3H PRN, Lennie Muckle McKenzie, PA-C, 2 mg at 07/26/16 0444 .  multivitamin with minerals tablet 1 tablet, 1 tablet, Oral, Daily, Rexanne Mano, MD, 1 tablet at 07/31/16 1046 .  ondansetron (ZOFRAN) injection 4 mg, 4 mg, Intravenous, Q4H PRN, Lennie Muckle McKenzie, PA-C .  oxyCODONE (OXYCONTIN) 12 hr tablet 20 mg, 20 mg, Oral, Q12H, Phylliss Bob, MD, 20 mg at 07/31/16 1046 .  oxyCODONE-acetaminophen (PERCOCET/ROXICET) 5-325 MG per tablet 1-2 tablet, 1-2 tablet, Oral, Q6H PRN, Rexanne Mano, MD, 2 tablet at 07/31/16 [id number] .  polyethylene glycol (MIRALAX / GLYCOLAX) packet 17 g, 17 g, Oral, BID, Mariel Aloe, MD, 17 g at 07/31/16 1046 .  senna-docusate (Senokot-S) tablet 1 tablet, 1 tablet, Oral, BID, Theodis Blaze, MD, 1 tablet at 07/31/16 1046 .  sodium chloride flush (NS) 0.9 % injection 10-40 mL, 10-40 mL, Intracatheter, PRN, Rexanne Mano, MD, 10 mL at 07/31/16 0516 .  sodium chloride flush (NS) 0.9 % injection 3 mL, 3 mL, Intravenous, Q12H, Rexanne Mano, MD, 3 mL at 07/30/16 1000 .  sodium chloride flush (NS) 0.9 % injection 3 mL, 3 mL, Intravenous, PRN, Rexanne Mano, MD .  sodium chloride  flush (NS) 0.9 % injection 3 mL, 3 mL, Intravenous, Q12H, Kayla J McKenzie, PA-C, 3 mL at 07/30/16 1000 .  sodium chloride flush (NS) 0.9 % injection 3 mL, 3 mL, Intravenous, PRN, Lennie Muckle McKenzie, PA-C .  vancomycin (VANCOCIN) IVPB 1000 mg/200 mL premix, 1,000 mg, Intravenous, Q12H, Mariel Aloe, MD, 1,000 mg at 07/31/16 1204 .  zolpidem (AMBIEN) tablet 5 mg, 5 mg, Oral, QHS PRN, Rexanne Mano, MD, 5 mg at 07/23/16 2231  Patients Current Diet: Diet regular Room service appropriate? Yes; Fluid consistency: Thin  Precautions / Restrictions Precautions Precautions: Back Precaution Booklet Issued: No Precaution Comments: Reviewed back precautions during tx. Spinal Brace: Thoracolumbosacral orthotic (with left leg extension) Spinal Brace Comments:  on upon arrival, pt able to instruct PT on adjustment Restrictions Weight Bearing Restrictions: No   Has the patient had 2 or more falls or a fall with injury in the past year?No  Prior Activity Level Community (5-7x/wk): Prior to admission patient drove and was out in the community daily.  He worked on [employer] for the [employer] and enjoyed playing pool and billiards 3 times a week with friends.  He also was active in his church's choir.    Home Assistive Devices / Equipment Home Assistive Devices/Equipment: None Home Equipment: Environmental consultant - 2 wheels, [place] - single point, Crutches, Bedside commode, Shower seat, Adaptive equipment  Prior Device Use: Indicate devices/aids used by the patient prior to current illness, exacerbation or injury? See above  Prior Functional Level Prior Function Level of Independence: Needs assistance Gait / Transfers Assistance Needed: Using RW ADL's / Homemaking Assistance Needed: Needs min assist at times for LB dressing Comments: using rw PTA  Self Care: Did the patient need help bathing, dressing, using the toilet or eating?  Independent  Indoor Mobility: Did the patient need assistance with walking  from room to room (with or without device)? Independent  Stairs: Did the patient need assistance with internal or external stairs (with or without device)? Independent  Functional Cognition: Did the patient need help planning regular tasks such as shopping or remembering to take medications? Independent  Current Functional Level Cognition  Overall Cognitive Status: Within Functional Limits for tasks assessed Orientation Level: Oriented X4    Extremity Assessment (includes Sensation/Coordination)  Upper Extremity Assessment: Generalized weakness  Lower Extremity Assessment: Defer to PT evaluation RLE Deficits / Details: grossly 3/5 LLE Deficits / Details: grossly 3/5    ADLs  Overall ADL's : Needs assistance/impaired Grooming: Set up, Bed level, Oral care Grooming Details (indicate cue type and reason): Poor balance Upper Body Bathing: Moderate assistance (simulated) Upper Body Bathing Details (indicate cue type and reason): Poor balance, TLSO Lower Body Bathing: Total assistance Upper Body Dressing : Moderate assistance, Sitting Upper Body Dressing Details (indicate cue type and reason): Poor balance, TLSO Lower Body Dressing: Total assistance Toilet Transfer: Moderate assistance, +2 for physical assistance, +2 for safety/equipment Toileting- Clothing Manipulation and Hygiene: Total assistance, Sit to/from stand Functional mobility during ADLs: Moderate assistance, +2 for physical assistance, +2 for safety/equipment, Rolling walker General ADL Comments: Pt has shower chair, reacher, sock aid and LH shoe horn at home    Mobility  Overal bed mobility: Needs Assistance, +2 for physical assistance Bed Mobility: Rolling, Sidelying to Sit Rolling: Mod assist, +2 for safety/equipment Sidelying to sit: Mod assist, +2 for physical assistance, HOB elevated Sit to sidelying: Mod assist, +2 for physical assistance, HOB elevated General bed mobility comments: Pt OOB in chair upon arrival     Transfers  Overall transfer level: Needs assistance Equipment used: Rolling walker (2 wheeled) Transfers: Sit to/from Stand Sit to Stand: Min assist Stand pivot transfers: Mod assist, +2 physical assistance General transfer comment: cues for hand placement and technique    Ambulation / Gait / Stairs / Wheelchair Mobility  Ambulation/Gait Ambulation/Gait assistance: Museum/gallery curator (Feet): 120 Feet Assistive device: Rolling walker (2 wheeled) Gait Pattern/deviations: Step-through pattern, Decreased stride length General Gait Details: multimodal cues for bilat knee extension during stance phase and for posture; pt unsteady due to weak bilat LE and maintaining bilat flexed knees  Gait velocity: decreased Gait velocity interpretation: Below normal speed for age/gender    Posture / Balance Dynamic Sitting  Balance Sitting balance - Comments: trunk supported and trunk unsupported in chair Balance Overall balance assessment: Needs assistance Sitting-balance support: No upper extremity supported, Feet supported Sitting balance-Leahy Scale: Fair Sitting balance - Comments: trunk supported and trunk unsupported in chair Postural control: Posterior lean, Left lateral lean Standing balance support: Bilateral upper extremity supported, During functional activity Standing balance-Leahy Scale: Poor Standing balance comment: limited by back pain and dependent on RW to off-load lower extremities    Special needs/care consideration BiPAP/CPAP: No CPM: No Continuous Drip IV: No Dialysis: No Life Vest: No Oxygen: No Special Bed: No Trach Size: No Wound Vac (area): No       Skin: Rash bottocks and thighs; Surgical incision loser back                              Bowel mgmt: 07/25/16 Bladder mgmt: Continent with urinal  Diabetic mgmt: No Other: Patient with hard, clam shell brace with chest plate and left lower leg extension      Previous Home Environment Living  Arrangements: Spouse/significant other Available Help at Discharge: Family, Available 24 hours/day Type of Home: House Home Layout: One level Home Access: Stairs to enter Entrance Stairs-Rails: Left Entrance Stairs-Number of Steps: 3 Bathroom Shower/Tub: Multimedia programmer: Associate Professor Accessibility: Yes Home Care Services: Yes Type of Home Care Services: Home RN  Discharge Living Setting Plans for Discharge Living Setting: Patient's home Type of Home at Discharge: House Discharge Home Layout: Two level, Able to live on main level with bedroom/bathroom Alternate Level Stairs-Number of Steps: 12-14 Discharge Home Access: Stairs to enter Entrance Stairs-Rails: Left Entrance Stairs-Number of Steps: 3 steps Discharge Bathroom Shower/Tub: Walk-in shower Discharge Bathroom Toilet: Standard (but modified for handicapped height ) Discharge Bathroom Accessibility: Yes How Accessible: Accessible via walker Does the patient have any problems obtaining your medications?: No  Social/Family/Support Systems Patient Roles: Spouse, Parent Contact Information: Spouse Walker Kehr  Anticipated Caregiver: Spouse  Anticipated Caregiver's Contact Information: [phone] Ability/Limitations of Caregiver: None Caregiver Availability: 24/7 Discharge Plan Discussed with Primary Caregiver: Yes Is Caregiver In Agreement with Plan?: Yes Does Caregiver/Family have Issues with Lodging/Transportation while Pt is in Rehab?: No  Goals/Additional Needs Patient/Family Goal for Rehab: PT/OT Supervision-Min assist  Expected length of stay: 7-10 days  Cultural Considerations: None Dietary Needs: Regular and thin  Equipment Needs: TBD Special Service Needs: None Additional Information: Prior home RN care provided by [facility]  Pt/Family Agrees to Admission and willing to participate: Yes Program Orientation Provided & Reviewed with Pt/Caregiver Including Roles  & Responsibilities:  Yes Additional Information Needs: None Information Needs to be Provided By: N/A  Decrease burden of Care through IP rehab admission: No  Possible need for SNF placement upon discharge: No  Patient Condition: This patient's medical and functional status has changed since the consult dated: 07/27/16 in which the Rehabilitation Physician determined and documented that the patient's condition is appropriate for intensive rehabilitative care in an inpatient rehabilitation facility. See "History of Present Illness" (above) for medical update. Functional changes are: min assist transfers , min assist with RW 120' Patient's medical and functional status update has been discussed with the Rehabilitation physician and patient remains appropriate for inpatient rehabilitation. Will admit to inpatient rehab today.   Preadmission Screen Completed By:  Gunnar Fusi, with updates by Gerlean Ren  07/31/2016 3:15 PM ______________________________________________________________________   Discussed status with Dr. Posey Pronto on 07/31/16 at  1515  and received telephone  approval for admission today.  Admission Coordinator:  Gerlean Ren, time [id number] /Date 07/31/16

## 2016-07-28 NOTE — Progress Notes (Addendum)
Inpatient Rehabilitation  I met with patient and spouse at bedside to discuss team's recommendation for IP Rehab.  I shared booklets and answered questions.  Patient is eager to regain his independence.  I continue to await insurance authorization.  Plan to follow up with team as I know; however, hopeful for a decision later today.  Please call with questions.   Melissa Bowie, M.A., CCC/SLP Admission Coordinator  [facility]  Cell [phone]   

## 2016-07-28 NOTE — Progress Notes (Addendum)
Physical Therapy Treatment Patient Details Name: CORRI BLAUER MRN: [id number] DOB: Dec 26, 1940 Today's Date: 07/28/2016    History of Present Illness 75 year old Caucasian male past history significant for prostate cancer status post prostatectomy, and spinal stenosis status post L3/L4 and L4/L5 decompression on 720/2017 by [name]. 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 He was placed on vancomycin and ceftriaxone to complete an 8 week course of home antibiotics via  RUE PICC line, presented with worsening back pain. Imaging studies showed osteomyelitis of L4-L5 and epidural abscess.  I &D and fusion L3-S1.    PT Comments    Pt is able to tolerate donning brace in seated and requires assistance for proper log roll from supine to sit. Requires 2 person assist to safely don brace due to decreased postural control, but this appears to be improved from yesterday. Improved ability to perform sit to stand and gait with RW with 2 person assist for safety this session and recliner follow. Pt is able to perform gait x 100'. Pt continues to benefit from CIR for family education on donning brace and for continued strengthening.    Follow Up Recommendations        Equipment Recommendations       Recommendations for Other Services       Precautions / Restrictions Precautions Precautions: Back Precaution Booklet Issued: No Precaution Comments: Reviewed back precautions during tx. Required Braces or Orthoses: Spinal Brace Spinal Brace: Thoracolumbosacral orthotic;Other (comment) (Le Extension) Spinal Brace Comments: donned in sitting this session Restrictions Weight Bearing Restrictions: No    Mobility  Bed Mobility Overal bed mobility: Needs Assistance;+2 for physical assistance Bed Mobility: Rolling;Sidelying to Sit Rolling: Mod assist;+2 for safety/equipment Sidelying to sit: Mod assist;+2 for  physical assistance;HOB elevated     Sit to sidelying: Mod assist;+2 for physical assistance;HOB elevated    Transfers Overall transfer level: Needs assistance Equipment used: Rolling walker (2 wheeled) Transfers: Sit to/from Stand Sit to Stand: Mod assist;+2 physical assistance;+2 safety/equipment;From elevated surface         General transfer comment: Posterior lean with standing, this is corrected with cueing. Requires 2 person assist to tighten brace and assist with locking right knee extension into place  Ambulation/Gait Ambulation/Gait assistance: Mod assist;+2 physical assistance;+2 safety/equipment Ambulation Distance (Feet): 100 Feet Assistive device: Rolling walker (2 wheeled) Gait Pattern/deviations: Step-through pattern;Antalgic;Leaning posteriorly Gait velocity: decreased Gait velocity interpretation: Below normal speed for age/gender General Gait Details: pt with posterior lean that is improved with cues, performed gait with improved pattern and ability to increased distance with 2 person assist and chiar follow.    Stairs            Wheelchair Mobility    Modified Rankin (Stroke Patients Only)       Balance Overall balance assessment: Needs assistance Sitting-balance support: Bilateral upper extremity supported Sitting balance-Leahy Scale: Poor Sitting balance - Comments: required bil UE support for balance in sitting.    Standing balance support: Bilateral upper extremity supported Standing balance-Leahy Scale: Poor Standing balance comment: relies on bilateral UE's on RW and physical assistance and cueing to maintain upright posture                    Cognition Arousal/Alertness: Awake/alert Behavior During Therapy: WFL for tasks assessed/performed Overall Cognitive Status: Within Functional Limits for tasks assessed  Exercises General Exercises - Lower Extremity Ankle Circles/Pumps: AROM;Both;10  reps;Supine Quad Sets: AROM;Both;10 reps;Supine    General Comments        Pertinent Vitals/Pain Pain Assessment: 0-10 Pain Score: 3  Pain Location: low back Pain Descriptors / Indicators: Aching Pain Intervention(s): Monitored during session    Home Living                      Prior Function            PT Goals (current goals can now be found in the care plan section) Acute Rehab PT Goals Patient Stated Goal: be able to walk and move without pain Progress towards PT goals: Progressing toward goals    Frequency           PT Plan      Co-evaluation PT/OT/SLP Co-Evaluation/Treatment: Yes Reason for Co-Treatment: Complexity of the patient's impairments (multi-system involvement);For patient/therapist safety PT goals addressed during session: Mobility/safety with mobility       End of Session Equipment Utilized During Treatment: Gait belt;Back brace Activity Tolerance: Patient limited by fatigue Patient left: in bed;with call bell/phone within reach;with family/visitor present;with SCD's reapplied     Time: [id number] PT Time Calculation (min) (ACUTE ONLY): 38 min  Charges:  $Gait Training: 8-22 mins $Therapeutic Activity: 8-22 mins                    G Codes:      Scheryl Marten PT, DPT  [phone]  07/28/2016, 3:11 PM

## 2016-07-28 NOTE — Progress Notes (Signed)
Occupational Therapy Treatment Patient Details Name: DHANVIN STORZ MRN: [id number] DOB: June 29, 1941 Today's Date: 07/28/2016    History of present illness 75 year old Caucasian male past history significant for prostate cancer status post prostatectomy, and spinal stenosis status post L3/L4 and L4/L5 decompression on 720/2017 by [name]. 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 He was placed on vancomycin and ceftriaxone to complete an 8 week course of home antibiotics via  RUE PICC line, presented with worsening back pain. Imaging studies showed osteomyelitis of L4-L5 and epidural abscess.  I &D and fusion L3-S1.   OT comments  Pt progressing well toward OT goals. Pt continues to be apprehensive for functional mobility but was able to complete simulated toilet transfer with decreased assist of mod assist +2 for safety and physical assist this date. Pt able to assist with donning/doffing brace this session but continues to require max assist +2. D/C plan remains appropriate and continue to recommend CIR placement for continued rehabilitation.   Follow Up Recommendations  CIR    Equipment Recommendations  None recommended by OT       Precautions / Restrictions Precautions Precautions: Back Precaution Booklet Issued: No Precaution Comments: Reviewed back precautions during tx. Required Braces or Orthoses: Spinal Brace Spinal Brace: Thoracolumbosacral orthotic (LE extension) Spinal Brace Comments: donned in sitting this session Restrictions Weight Bearing Restrictions: No       Mobility Bed Mobility Overal bed mobility: Needs Assistance;+2 for physical assistance Bed Mobility: Rolling;Sidelying to Sit Rolling: Mod assist;+2 for safety/equipment Sidelying to sit: Mod assist;+2 for physical assistance;HOB elevated     Sit to sidelying: Mod assist;+2 for physical assistance;HOB elevated     Transfers Overall transfer level: Needs assistance Equipment used: Rolling walker (2 wheeled) Transfers: Sit to/from Stand Sit to Stand: Mod assist;+2 physical assistance;+2 safety/equipment;From elevated surface         General transfer comment: Posterior lean with standing, this is corrected with cueing. Requires 2 person assist to tighten brace and assist with locking right knee extension into place    Balance Overall balance assessment: Needs assistance Sitting-balance support: Bilateral upper extremity supported;Feet supported Sitting balance-Leahy Scale: Poor Sitting balance - Comments: required bil UE support for balance in sitting.    Standing balance support: Bilateral upper extremity supported Standing balance-Leahy Scale: Poor Standing balance comment: Reliant on B UE support and physical assist to maintain balance.                   ADL Overall ADL's : Needs assistance/impaired     Grooming: Set up;Bed level;Oral care                   Toilet Transfer: Moderate assistance;+2 for physical assistance;+2 for safety/equipment           Functional mobility during ADLs: Moderate assistance;+2 for physical assistance;+2 for safety/equipment;Rolling walker General ADL Comments: Pt has shower chair, reacher, sock aid and LH shoe horn at home      Vision                     Perception     Praxis      Cognition   Behavior During Therapy: [facility] for tasks assessed/performed Overall Cognitive Status: Within Functional Limits for tasks assessed                       Extremity/Trunk Assessment  Exercises General Exercises - Lower Extremity Ankle Circles/Pumps: AROM;Both;10 reps;Supine Quad Sets: AROM;Both;10 reps;Supine   Shoulder Instructions       General Comments      Pertinent Vitals/ Pain       Pain Assessment: 0-10 Pain Score: 3  Pain Location: low back Pain Descriptors / Indicators: Aching Pain  Intervention(s): Monitored during session;Repositioned  Home Living                                          Prior Functioning/Environment              Frequency  Min 2X/week        Progress Toward Goals  OT Goals(current goals can now be found in the care plan section)  Progress towards OT goals: Progressing toward goals  Acute Rehab OT Goals Patient Stated Goal: be able to walk and move without pain OT Goal Formulation: With patient/family Time For Goal Achievement: 08/03/16 Potential to Achieve Goals: Good ADL Goals Pt Will Perform Grooming: with min guard assist;with supervision;sitting Pt Will Perform Upper Body Bathing: with min guard assist;sitting Pt Will Perform Lower Body Bathing: with adaptive equipment;with max assist;with mod assist;sitting/lateral leans Pt Will Perform Upper Body Dressing: with min guard assist;sitting Pt Will Transfer to Toilet: with mod assist;with min assist;with +2 assist;bedside commode;ambulating Pt Will Perform Toileting - Clothing Manipulation and hygiene: with mod assist;sit to/from stand;with caregiver independent in assisting Additional ADL Goal #1: Pt/family education for donning/doffing TLSO brace  Plan Discharge plan remains appropriate    Co-evaluation      Reason for Co-Treatment: Complexity of the patient's impairments (multi-system involvement) PT goals addressed during session: Mobility/safety with mobility OT goals addressed during session: ADL's and self-care      End of Session Equipment Utilized During Treatment: Rolling walker;Back brace (TLSO with LE extension)   Activity Tolerance Patient tolerated treatment well   Patient Left in bed;with call bell/phone within reach;with family/visitor present   Nurse Communication Mobility status        Time: [id number] OT Time Calculation (min): 43 min  Charges: OT General Charges $OT Visit: 1 Procedure OT Treatments $Self Care/Home  Management : 8-22 mins  Norman Herrlich, OTR/L [phone] 07/28/2016, 3:35 PM

## 2016-07-29 LAB — CBC
HCT: 28.9 % — ABNORMAL LOW (ref 39.0–52.0)
HEMOGLOBIN: 9.1 g/dL — AB (ref 13.0–17.0)
MCH: 25.3 pg — AB (ref 26.0–34.0)
MCHC: 31.5 g/dL (ref 30.0–36.0)
MCV: 80.3 fL (ref 78.0–100.0)
Platelets: 210 10*3/uL (ref 150–400)
RBC: 3.6 MIL/uL — ABNORMAL LOW (ref 4.22–5.81)
RDW: 17.3 % — ABNORMAL HIGH (ref 11.5–15.5)
WBC: 6.2 10*3/uL (ref 4.0–10.5)

## 2016-07-29 LAB — BASIC METABOLIC PANEL
Anion gap: 6 (ref 5–15)
BUN: 6 mg/dL (ref 6–20)
CHLORIDE: 109 mmol/L (ref 101–111)
CO2: 27 mmol/L (ref 22–32)
CREATININE: 0.85 mg/dL (ref 0.61–1.24)
Calcium: 8.4 mg/dL — ABNORMAL LOW (ref 8.9–10.3)
GFR calc Af Amer: >60 mL/min (ref >60)
GFR calc non Af Amer: >60 mL/min (ref >60)
Glucose, Bld: 139 mg/dL — ABNORMAL HIGH (ref 65–99)
Potassium: 3.5 mmol/L (ref 3.5–5.1)
SODIUM: 142 mmol/L (ref 135–145)

## 2016-07-29 NOTE — Progress Notes (Signed)
PT Cancellation Note  Patient Details Name: Donald [name] MRN: [id number] DOB: 1940/12/20   Cancelled Treatment:    Reason Eval/Treat Not Completed: Patient declined, no reason specified. Pt declining participation in therapy session at this time, requesting that therapist return at a later time as he "does not feel well". Pt stated that he feels as if he has a fever; however, pt's NT recently took his vitals and reported that all vital signs were normal. PT will continue to f/u with pt as appropriate and available.   Clearnce Sorrel Yoandri Congrove 07/29/2016, 10:41 AM Sherie Don, PT, DPT [phone]

## 2016-07-29 NOTE — Progress Notes (Signed)
Patient refused laxative for the night but wanted to try warm prune juice.Patient wanted to have laxative in the morning if needed.

## 2016-07-29 NOTE — Progress Notes (Signed)
    Patient doing well Ambulated yesterday in hall, not yet today   Physical Exam: Vitals:   07/29/16 0040 07/29/16 0830  BP: 122/70 (!) 105/59  Pulse:  66  Resp:    Temp:  98.4 F (36.9 C)    Dressing in place NVI  Drain output scant last 12 hours  POD #5 s/p I and D and fusion L3-pelvis  - up with PT/OT, encourage ambulation - Percocet for pain, Valium for muscle spasms - transfer to CIR when bed available--Likely Monday - dressing to be removed on Sunday - d/c drain today

## 2016-07-29 NOTE — Progress Notes (Signed)
PROGRESS NOTE    Donald [name]  [id number] DOB: [birth year] DOA: 07/20/2016 PCP: [name], MD   Brief Narrative: Donald [name] is a 75 y.o. male with a history of prostate cancer s/p prostatectomy and spinal stenosis s/p L3-4, L4-5 decompression. Patient presented with left hip pain and weakness and found to have a recurrent epidural abscess.   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 -continue antibiotics per ID recommendations -follow-up tissue cultures, gram stain, AFB culture -continue analgesics prn -PT recommendations: CIR (awaiting approval from insurance)  Candidiasis -continue fluconazole per ID  Microcytic anemia Stable   DVT prophylaxis: SCDs Code Status: Full code Family Communication: Wife at bedside Disposition Plan: Discharge pending infectious disease recommendations for outpatient antibiotic regimen   Consultants:   Infectious disease  Orthopedic surgery  Procedures:   Ddebridement, laminectomy, posterior spinal fusion (11/14)  Antimicrobials:  Ceftriaxone (11/10)  Fluconazole (11/10>>  Ceftazidime (11/10>>  Vancomycin (11/10>>11/16)   Subjective: Back pain improved today.  Objective: Vitals:   07/28/16 2046 07/29/16 0034 07/29/16 0040 07/29/16 0830  BP: (!) 111/56 (!) 151/107 122/70 (!) 105/59  Pulse: 62 67  66  Resp:      Temp: 98.8 F (37.1 C) 98.5 F (36.9 C)  98.4 F (36.9 C)  TempSrc: Oral Oral  Oral  SpO2: 92% 95%  96%    Intake/Output Summary (Last 24 hours) at 07/29/16 1420 Last data filed at 07/28/16 1900  Gross per 24  hour  Intake              360 ml  Output               30 ml  Net              330 ml   There were no vitals filed for this visit.  Examination:  General exam: Appears calm and comfortable Respiratory system: Clear to auscultation. Respiratory effort normal. Cardiovascular system: S1 & S2 heard, RRR. No murmurs, rubs, gallops or clicks. Gastrointestinal system: Abdomen is nondistended, soft and nontender. Normal bowel sounds heard. Central nervous system: Alert and oriented. No focal neurological deficits. 4/5 LE strength Extremities: No edema. No calf tenderness Skin: No cyanosis. No rashes Psychiatry: Judgement and insight appear normal. Mood & affect appropriate.     Data Reviewed: I have personally reviewed following labs and imaging studies  CBC:  Recent Labs Lab 07/25/16 2031 07/26/16 0327 07/27/16 0457 07/28/16 0839 07/29/16 0830  WBC  --  11.9* 9.5  --  6.2  HGB 10.2* 9.2* 7.5* 9.0* 9.1*  HCT 30.0* 29.6* 24.8* 28.7* 28.9*  MCV  --  77.1* 78.7  --  80.3  PLT  --  303 255  --  [id number]   Basic Metabolic Panel:  Recent Labs Lab 07/24/16 1155 07/25/16 1821 07/25/16 2031 07/26/16 0327 07/29/16 0830  NA 140 140 139 137 142  K 4.1 4.2 4.3 4.3 3.5  CL 104  --   --  103 109  CO2 28  --   --  25 27  GLUCOSE 96 157* 191* 160* 139*  BUN 10  --   --  10 6  CREATININE 0.89  --   --  0.78 0.85  CALCIUM 8.6*  --   --  8.4* 8.4*   GFR: Estimated Creatinine Clearance: 85 mL/min (by C-G formula based on SCr of 0.85 mg/dL). Liver Function Tests: No results for input(s): AST, ALT, ALKPHOS, BILITOT, PROT, ALBUMIN in the last 168 hours. No results for input(s): LIPASE, AMYLASE in the last 168 hours. No results for input(s): AMMONIA in the last 168 hours. Coagulation Profile: No results for input(s): INR, PROTIME in the last 168 hours. Cardiac Enzymes: No results for input(s): CKTOTAL, CKMB, CKMBINDEX, TROPONINI in the last 168 hours. BNP (last 3 results) No results for  input(s): PROBNP in the last 8760 hours. HbA1C: No results for input(s): HGBA1C in the last 72 hours. CBG: No results for input(s): GLUCAP in the last 168 hours. Lipid Profile: No results for input(s): CHOL, HDL, LDLCALC, TRIG, CHOLHDL, LDLDIRECT in the last 72 hours. Thyroid Function Tests: No results for input(s): TSH, T4TOTAL, FREET4, T3FREE, THYROIDAB in the last 72 hours. Anemia Panel: No results for input(s): VITAMINB12, FOLATE, FERRITIN, TIBC, IRON, RETICCTPCT in the last 72 hours. Sepsis Labs: No results for input(s): PROCALCITON, LATICACIDVEN in the last 168 hours.  Recent Results (from the past 240 hour(s))  Culture, blood (routine x 2)     Status: None   Collection Time: 07/20/16 11:53 PM  Result Value Ref Range Status   Specimen Description BLOOD LEFT ARM  Final   Special Requests BOTTLES DRAWN AEROBIC AND ANAEROBIC 6ML  Final   Culture NO GROWTH 5 DAYS  Final   Report Status 07/26/2016 FINAL  Final  Culture, blood (routine x 2)     Status: None   Collection Time: 07/20/16 11:59 PM  Result Value Ref Range Status   Specimen Description BLOOD LEFT HAND  Final   Special Requests BOTTLES DRAWN AEROBIC AND ANAEROBIC 6ML  Final   Culture NO GROWTH 5 DAYS  Final   Report Status 07/26/2016 FINAL  Final  MRSA PCR Screening     Status: None   Collection Time: 07/21/16  4:43 AM  Result Value Ref Range Status   MRSA by PCR NEGATIVE NEGATIVE Final    Comment:        The GeneXpert MRSA Assay (FDA approved for NASAL specimens only), is one component of a comprehensive MRSA colonization surveillance program. It is not intended to diagnose MRSA infection nor to guide or monitor treatment for MRSA infections.   Surgical PCR screen     Status: None   Collection Time: 07/23/16 12:00 AM  Result Value Ref Range Status   MRSA, PCR NEGATIVE NEGATIVE Final   Staphylococcus aureus NEGATIVE NEGATIVE Final    Comment:        The Xpert SA Assay (FDA approved for NASAL specimens in  patients over 34 years of age), is one component of a comprehensive surveillance program.  Test performance has been validated by [facility] for patients greater than or equal to 71 year old. It is not intended to diagnose infection nor to guide or monitor treatment.   Aerobic/Anaerobic Culture (surgical/deep wound)     Status: None (Preliminary result)   Collection Time: 07/25/16  3:02 PM  Result Value Ref Range Status   Specimen Description TISSUE  Final   Special Requests SWAB OF LUMBAR EPIDURAL  Final   Gram Stain   Final    MODERATE WBC PRESENT, PREDOMINANTLY PMN MODERATE GRAM POSITIVE COCCI IN PAIRS FEW GRAM VARIABLE ROD MODERATE  GRAM POSITIVE RODS RARE GRAM NEGATIVE RODS    Culture NO GROWTH 4 DAYS  Final   Report Status PENDING  Incomplete  Acid Fast Smear (AFB)     Status: None   Collection Time: 07/25/16  3:02 PM  Result Value Ref Range Status   AFB Specimen Processing Concentration  Final   Acid Fast Smear Negative  Final    Comment: (NOTE) Performed At: [facility] [place], [place] [id number] Lindon Romp MD [id number]    Source (AFB) SWAB OF SOFT TISSUE LUMBAR EPIDUIRAL  Corrected    Comment: CORRECTED ON 11/14 AT [id number]: PREVIOUSLY REPORTED AS SWAB OF LUMBAR EPIDUIRAL  Aerobic/Anaerobic Culture (surgical/deep wound)     Status: None (Preliminary result)   Collection Time: 07/25/16  5:02 PM  Result Value Ref Range Status   Specimen Description TISSUE  Final   Special Requests L4 L4 INTERVERTEBRAL DISC  Final   Gram Stain   Final    FEW WBC PRESENT, PREDOMINANTLY PMN FEW GRAM NEGATIVE RODS FEW GRAM VARIABLE ROD MODERATE GRAM POSITIVE COCCI IN PAIRS FEW GRAM POSITIVE RODS    Culture NO GROWTH 4 DAYS  Final   Report Status PENDING  Incomplete  Acid Fast Smear (AFB)     Status: None   Collection Time: 07/25/16  5:02 PM  Result Value Ref Range Status   AFB Specimen Processing Comment  Final    Comment: Tissue Grinding and  Digestion/Decontamination   Acid Fast Smear Negative  Final    Comment: (NOTE) Performed At: [facility] [address] [place], [place] [id number] Lindon Romp MD [id number]    Source (AFB) TISSUE  Final    Comment: L4,L5 INTERVERTEBRAL [place]         Radiology Studies: No results found.      Scheduled Meds: . sodium chloride   Intravenous Once  . cefTAZidime (FORTAZ)  IV  2 g Intravenous Q8H  . ferrous sulfate  325 mg Oral BID WC  . fluconazole  100 mg Oral Daily  . mouth rinse  15 mL Mouth Rinse BID  . multivitamin with minerals  1 tablet Oral Daily  . oxyCODONE  20 mg Oral Q12H  . polyethylene glycol  17 g Oral BID  . senna-docusate  1 tablet Oral BID  . sodium chloride flush  3 mL Intravenous Q12H  . sodium chloride flush  3 mL Intravenous Q12H  . vancomycin  1,000 mg Intravenous Q12H   Continuous Infusions: . sodium chloride 75 mL/hr at 07/28/16 2342  . sodium chloride       LOS: 9 days     Cordelia Poche [facility] 07/29/2016, 2:20 PM Pager: [phone]  If 7PM-7AM, please contact night-coverage [email or web address] Password [email or web address] 07/29/2016, 2:20 PM

## 2016-07-30 NOTE — Progress Notes (Signed)
Physical Therapy Treatment Patient Details Name: Donald [name] MRN: [id number] DOB: 05-Jul-1941 Today's Date: 07/30/2016    History of Present Illness 75 year old Caucasian male past history significant for prostate cancer status post prostatectomy, and spinal stenosis status post L3/L4 and L4/L5 decompression on 720/2017 by [name]. 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 He was placed on vancomycin and ceftriaxone to complete an 8 week course of home antibiotics via  RUE PICC line, presented with worsening back pain. Imaging studies showed osteomyelitis of L4-L5 and epidural abscess.  I &D and fusion L3-S1.    PT Comments    Pt more mobile today, feels better vs yesterday.  Able to walk with assistance though became fatigued at end (able to make it back to room without event).  See note below for details of session; remains appropriate for CIR level activities to return to highest functional level.     Follow Up Recommendations  CIR;Supervision/Assistance - 24 hour     Equipment Recommendations  Rolling walker with 5" wheels;3in1 (PT) (tbd next setting of care)    Recommendations for Other Services       Precautions / Restrictions Precautions Precautions: Back Precaution Comments: Reviewed back precautions during tx. Required Braces or Orthoses: Spinal Brace Spinal Brace: Thoracolumbosacral orthotic (with left leg extension) Spinal Brace Comments: on upon arrival, pt able to instruct PT on adjustment    Mobility  Bed Mobility               General bed mobility comments: up in chair, RN assisted into brace supine, back and forth to bathroom, up to chair  Transfers Overall transfer level: Needs assistance Equipment used: Rolling walker (2 wheeled) Transfers: Sit to/from Stand Sit to Stand: Mod assist         General transfer comment: verbal cues to position edge of chair prior  to stand and to align prior to sitting; assist for initial liftoff and during hand transition  Ambulation/Gait Ambulation/Gait assistance: Min assist Ambulation Distance (Feet): 110 Feet Assistive device: Rolling walker (2 wheeled) Gait Pattern/deviations: Step-through pattern;Antalgic;Narrow base of support;Decreased stride length     General Gait Details: cues for posture including look forward, needs frequent reinforcement; heavily dependent on RW, which increased as pt fatigued; incr WOB over last 30-40 feet as fatigue increased; pace is slow and rather steady and pt declines need for standing rest.     Stairs            Wheelchair Mobility    Modified Rankin (Stroke Patients Only)       Balance Overall balance assessment: Needs assistance Sitting-balance support: No upper extremity supported;Feet supported Sitting balance-Leahy Scale: Fair Sitting balance - Comments: trunk supported and trunk unsupported in chair   Standing balance support: Bilateral upper extremity supported;During functional activity Standing balance-Leahy Scale: Poor Standing balance comment: limited by back pain and dependent on RW to off-load lower extremities                    Cognition Arousal/Alertness: Awake/alert Behavior During Therapy: WFL for tasks assessed/performed Overall Cognitive Status: Within Functional Limits for tasks assessed                      Exercises      General Comments General comments (skin integrity, edema, etc.): pt aware of need to keep clothing between brace and skin      Pertinent Vitals/Pain Pain Assessment: 0-10  Pain Score: 3  Pain Location: back Pain Intervention(s): Limited activity within patient's tolerance;Monitored during session;Repositioned    Home Living                      Prior Function            PT Goals (current goals can now be found in the care plan section) Acute Rehab PT Goals Patient Stated Goal:  be able to walk and move without pain Progress towards PT goals: Progressing toward goals    Frequency    Min 5X/week      PT Plan Current plan remains appropriate    Co-evaluation             End of Session Equipment Utilized During Treatment: Gait belt;Back brace Activity Tolerance: Patient tolerated treatment well;Patient limited by fatigue Patient left: in chair;with call bell/phone within reach;with family/visitor present     Time: [id number] PT Time Calculation (min) (ACUTE ONLY): 27 min  Charges:  $Gait Training: 8-22 mins $Therapeutic Activity: 8-22 mins                    G Codes:      Herbie Drape 07/30/2016, 12:25 PM

## 2016-07-30 NOTE — Progress Notes (Addendum)
Pharmacy Antibiotic Note  Donald [name] is a 75 y.o. male admitted on 07/20/2016 with lumbar back pain.  Pt's problems stem from a lumbar decompression completed in June of this year. He was on vancomycin and ceftriaxone at home for discitis - stop date was to be on 12/13. His lumbar wound grew out propionibacterium, however his antibiotics were broadened due to worsening infection to vanc/ceftazidime.  He is currently on vanc/ceftazidime for discitis with osteomyelitis and recurrent epidural abscess, POD #4 I&D and fusion L3-pelvis.  11/14 gram stains mult-imicrobial but cultures are no growth x 4 days.  Currently afebrile, WBC wnl. Creat WNL.   Plan: Continue vancomycin 1 gm IV q12h with trough goal 15- 20 mcg/ml  Continue ceftazidime 2g/8h Weekly vancomycin trough on Thursday ID recs: 8 weeks IV abx using 11/16 as day #1 so end date would be 07/22/2017    Temp (24hrs), Avg:98.4 F (36.9 C), Min:98.3 F (36.8 C), Max:98.6 F (37 C)   Recent Labs Lab 07/24/16 1155 07/26/16 0327 07/27/16 0457 07/27/16 1358 07/29/16 0830  WBC  --  11.9* 9.5  --  6.2  CREATININE 0.89 0.78  --   --  0.85  VANCOTROUGH  --   --   --  19  --     Estimated Creatinine Clearance: 85 mL/min (by C-G formula based on SCr of 0.85 mg/dL).    Allergies  Allergen Reactions  . Iodine Anaphylaxis  . Ivp Dye [Iodinated Diagnostic Agents] Anaphylaxis    Antimicrobials this admission:  Vancomycin 10/19 >> (12/13) Ceftriaxone 10/20 >> 10/21, 10/23 >> 11/10 Ceftazidime 11/10>> (12/13) Fluconazole 11/10  Dose adjustments this admission:  10/23 VT 12 on 750mg  q12, increase to 1g q12 11/10 VT 18 on 1g q12 - cont 11/16 VT 19 on 1 gm q12 ( VT drawn 1 hr after dose due)   Microbiology results:  10/19 Lumbar wound: Propionibacterium acnes 11/12 MRSA PCR: negative  11/14 tissue intervertebral disc: gram stain: few GNR, few GVR, mod GPcocc in pairs, few GRP No growth x 4 days, AFB neg 11/14 swab of lumbar  epidural: gram stain: mod GPcocci in pairs, rare GNRs, no growth 4 days  Thank you for allowing pharmacy to be a part of this patient's care.  Eudelia Bunch, Pharm.D. [id number] 07/30/2016 11:51 AM

## 2016-07-30 NOTE — Progress Notes (Addendum)
    Patient doing well Sitting up in chair with brace applied. Dressing still on Drain removed yesterday +BM today   Physical Exam: Vitals:   07/30/16 0109 07/30/16 0552  BP: 128/79 139/71  Pulse: 62 64  Resp:    Temp: 98.4 F (36.9 C) 98.3 F (36.8 C)    Dressing in place NVI  Drain not present  POD #4 s/p I and D and fusion L3-pelvis  - up with PT/OT, encourage ambulation - Percocet for pain, Valium for muscle spasms - transfer to CIR when bed available--Likely Monday - dressing to be removed later today -antibiotics per ID [place], MD Mobile [phone]

## 2016-07-30 NOTE — Progress Notes (Signed)
PROGRESS NOTE    Donald [name]  [id number] DOB: 08-Jan-1941 DOA: 07/20/2016 PCP: [name], MD   Brief Narrative: Donald [name] is a 75 y.o. male with a history of prostate cancer s/p prostatectomy and spinal stenosis s/p L3-4, L4-5 decompression. Patient presented with left hip pain and weakness and found to have a recurrent epidural abscess.   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 Gram stain significant for multiple microorganisms -continue antibiotics per ID recommendations -follow-up tissue cultures (no growth x4 days) -continue analgesics prn -PT recommendations: CIR  Candidiasis -continue fluconazole per ID  Microcytic anemia Stable   DVT prophylaxis: SCDs Code Status: Full code Family Communication: Wife at bedside Disposition Plan: Discharge to CIR pending infectious disease recommendations for outpatient antibiotic regimen   Consultants:   Infectious disease  Orthopedic surgery  Physical medicine  Procedures:   Ddebridement, laminectomy, posterior spinal fusion (11/14)  Antimicrobials:  Ceftriaxone (11/10)  Fluconazole (11/10>>  Ceftazidime (11/10>>  Vancomycin (11/10>>11/16)   Subjective: Back pain still present. Planning to get out of bed today. No other concerns.  Objective: Vitals:   07/29/16 1500 07/29/16 1954 07/30/16 0109 07/30/16 0552  BP: 125/71 (!) 122/56 128/79 139/71  Pulse: 64 67 62 64  Resp:      Temp: 98.6 F (37 C) 98.4 F (36.9 C) 98.4 F (36.9 C) 98.3 F (36.8 C)  TempSrc: Oral Oral Oral Oral  SpO2: 98% 95% 96% 96%     Intake/Output Summary (Last 24 hours) at 07/30/16 0934 Last data filed at 07/29/16 1642  Gross per 24 hour  Intake               10 ml  Output                0 ml  Net               10 ml   There were no vitals filed for this visit.  Examination:  General exam: Appears calm and comfortable Respiratory system: Clear to auscultation. Respiratory effort normal. Cardiovascular system: S1 & S2 heard, RRR. No murmurs, rubs, gallops or clicks. Gastrointestinal system: Abdomen is nondistended, soft and nontender. Normal bowel sounds heard. Central nervous system: Alert and oriented. No focal neurological deficits. 4/5 LE strength Extremities: No edema. No calf tenderness Skin: No cyanosis. No rashes Psychiatry: Judgement and insight appear normal. Mood & affect appropriate.     Data Reviewed: I have personally reviewed following labs and imaging studies  CBC:  Recent Labs Lab 07/25/16 2031 07/26/16 0327 07/27/16 0457 07/28/16 0839 07/29/16 0830  WBC  --  11.9* 9.5  --  6.2  HGB 10.2* 9.2* 7.5* 9.0* 9.1*  HCT 30.0* 29.6* 24.8* 28.7* 28.9*  MCV  --  77.1* 78.7  --  80.3  PLT  --  303 255  --  [id number]   Basic Metabolic Panel:  Recent Labs Lab 07/24/16 1155 07/25/16 1821 07/25/16 2031 07/26/16 0327 07/29/16 0830  NA 140 140 139 137 142  K 4.1 4.2 4.3 4.3 3.5  CL 104  --   --  103 109  CO2 28  --   --  25 27  GLUCOSE 96 157* 191* 160* 139*  BUN 10  --   --  10 6  CREATININE 0.89  --   --  0.78 0.85  CALCIUM 8.6*  --   --  8.4* 8.4*   GFR: Estimated Creatinine Clearance: 85 mL/min (by C-G formula based on SCr of 0.85 mg/dL). Liver Function Tests: No results for input(s): AST, ALT, ALKPHOS, BILITOT, PROT, ALBUMIN in the last 168 hours. No results for input(s): LIPASE, AMYLASE in the last 168 hours. No results for input(s): AMMONIA in the last 168 hours. Coagulation Profile: No results for input(s): INR, PROTIME in the last 168 hours. Cardiac Enzymes: No results  for input(s): CKTOTAL, CKMB, CKMBINDEX, TROPONINI in the last 168 hours. BNP (last 3 results) No results for input(s): PROBNP in the last 8760 hours. HbA1C: No results for input(s): HGBA1C in the last 72 hours. CBG: No results for input(s): GLUCAP in the last 168 hours. Lipid Profile: No results for input(s): CHOL, HDL, LDLCALC, TRIG, CHOLHDL, LDLDIRECT in the last 72 hours. Thyroid Function Tests: No results for input(s): TSH, T4TOTAL, FREET4, T3FREE, THYROIDAB in the last 72 hours. Anemia Panel: No results for input(s): VITAMINB12, FOLATE, FERRITIN, TIBC, IRON, RETICCTPCT in the last 72 hours. Sepsis Labs: No results for input(s): PROCALCITON, LATICACIDVEN in the last 168 hours.  Recent Results (from the past 240 hour(s))  Culture, blood (routine x 2)     Status: None   Collection Time: 07/20/16 11:53 PM  Result Value Ref Range Status   Specimen Description BLOOD LEFT ARM  Final   Special Requests BOTTLES DRAWN AEROBIC AND ANAEROBIC 6ML  Final   Culture NO GROWTH 5 DAYS  Final   Report Status 07/26/2016 FINAL  Final  Culture, blood (routine x 2)     Status: None   Collection Time: 07/20/16 11:59 PM  Result Value Ref Range Status   Specimen Description BLOOD LEFT HAND  Final   Special Requests BOTTLES DRAWN AEROBIC AND ANAEROBIC 6ML  Final   Culture NO GROWTH 5 DAYS  Final   Report Status 07/26/2016 FINAL  Final  MRSA PCR Screening     Status: None   Collection Time: 07/21/16  4:43 AM  Result Value Ref Range Status   MRSA by PCR NEGATIVE NEGATIVE Final    Comment:        The GeneXpert MRSA Assay (FDA approved for NASAL specimens only), is one component of a comprehensive MRSA colonization surveillance program. It is not intended to diagnose MRSA infection nor to guide or monitor treatment for MRSA infections.   Surgical PCR screen     Status: None   Collection Time: 07/23/16 12:00 AM  Result Value Ref Range Status   MRSA, PCR NEGATIVE NEGATIVE Final   Staphylococcus  aureus NEGATIVE NEGATIVE Final    Comment:        The Xpert SA Assay (FDA approved for NASAL specimens in patients over 70 years of age), is one component of a comprehensive surveillance program.  Test performance has been validated by [facility] for patients greater than or equal to 9 year old. It is not intended to diagnose infection nor to guide or monitor treatment.   Aerobic/Anaerobic Culture (surgical/deep wound)     Status: None (Preliminary result)   Collection Time: 07/25/16  3:02 PM  Result Value Ref Range Status   Specimen Description TISSUE  Final   Special Requests SWAB OF LUMBAR EPIDURAL  Final   Gram Stain   Final  MODERATE WBC PRESENT, PREDOMINANTLY PMN MODERATE GRAM POSITIVE COCCI IN PAIRS FEW GRAM VARIABLE ROD MODERATE GRAM POSITIVE RODS RARE GRAM NEGATIVE RODS    Culture NO GROWTH 4 DAYS  Final   Report Status PENDING  Incomplete  Acid Fast Smear (AFB)     Status: None   Collection Time: 07/25/16  3:02 PM  Result Value Ref Range Status   AFB Specimen Processing Concentration  Final   Acid Fast Smear Negative  Final    Comment: (NOTE) Performed At: [facility] [place], [place] [id number] Lindon Romp MD [id number]    Source (AFB) SWAB OF SOFT TISSUE LUMBAR EPIDUIRAL  Corrected    Comment: CORRECTED ON 11/14 AT [id number]: PREVIOUSLY REPORTED AS SWAB OF LUMBAR EPIDUIRAL  Aerobic/Anaerobic Culture (surgical/deep wound)     Status: None (Preliminary result)   Collection Time: 07/25/16  5:02 PM  Result Value Ref Range Status   Specimen Description TISSUE  Final   Special Requests L4 L4 INTERVERTEBRAL DISC  Final   Gram Stain   Final    FEW WBC PRESENT, PREDOMINANTLY PMN FEW GRAM NEGATIVE RODS FEW GRAM VARIABLE ROD MODERATE GRAM POSITIVE COCCI IN PAIRS FEW GRAM POSITIVE RODS    Culture NO GROWTH 4 DAYS  Final   Report Status PENDING  Incomplete  Acid Fast Smear (AFB)     Status: None   Collection Time: 07/25/16  5:02 PM    Result Value Ref Range Status   AFB Specimen Processing Comment  Final    Comment: Tissue Grinding and Digestion/Decontamination   Acid Fast Smear Negative  Final    Comment: (NOTE) Performed At: [facility] [address] [place], [place] [id number] Lindon Romp MD [id number]    Source (AFB) TISSUE  Final    Comment: L4,L5 INTERVERTEBRAL [place]         Radiology Studies: No results found.      Scheduled Meds: . sodium chloride   Intravenous Once  . cefTAZidime (FORTAZ)  IV  2 g Intravenous Q8H  . ferrous sulfate  325 mg Oral BID WC  . fluconazole  100 mg Oral Daily  . mouth rinse  15 mL Mouth Rinse BID  . multivitamin with minerals  1 tablet Oral Daily  . oxyCODONE  20 mg Oral Q12H  . polyethylene glycol  17 g Oral BID  . senna-docusate  1 tablet Oral BID  . sodium chloride flush  3 mL Intravenous Q12H  . sodium chloride flush  3 mL Intravenous Q12H  . vancomycin  1,000 mg Intravenous Q12H   Continuous Infusions: . sodium chloride 75 mL/hr at 07/28/16 2342  . sodium chloride       LOS: 10 days     Cordelia Poche [facility] 07/30/2016, 9:34 AM Pager: [phone]  If 7PM-7AM, please contact night-coverage [email or web address] Password [email or web address] 07/30/2016, 9:34 AM

## 2016-07-30 NOTE — Progress Notes (Signed)
[facility]   Reason for visit: Follow up on discitis with osteomyelitis  Interval History: Day 5 Abx - vancomycin/ceftazidime, since surgery.  Day 10 on new regimen. No fever, up walking.  CIR tomorrow planned.   Physical Exam: Constitutional:  Vitals:   07/30/16 0552 07/30/16 1300  BP: 139/71 103/69  Pulse: 64 64  Resp:    Temp: 98.3 F (36.8 C) 98.3 F (36.8 C)   patient appears in NAD Respiratory: Normal respiratory effort; CTA B Cardiovascular: RRR MS: brace on  Review of Systems: Constitutional: negative for fevers and chills Gastrointestinal: negative for diarrhea  Lab Results  Component Value Date   WBC 6.2 07/29/2016   HGB 9.1 (L) 07/29/2016   HCT 28.9 (L) 07/29/2016   MCV 80.3 07/29/2016   PLT 210 07/29/2016    Lab Results  Component Value Date   CREATININE 0.85 07/29/2016   BUN 6 07/29/2016   NA 142 07/29/2016   K 3.5 07/29/2016   CL 109 07/29/2016   CO2 27 07/29/2016    Lab Results  Component Value Date   ALT 14 (L) 07/21/2016   AST 16 07/21/2016   ALKPHOS 67 07/21/2016     Microbiology: Recent Results (from the past 240 hour(s))  Culture, blood (routine x 2)     Status: None   Collection Time: 07/20/16 11:53 PM  Result Value Ref Range Status   Specimen Description BLOOD LEFT ARM  Final   Special Requests BOTTLES DRAWN AEROBIC AND ANAEROBIC 6ML  Final   Culture NO GROWTH 5 DAYS  Final   Report Status 07/26/2016 FINAL  Final  Culture, blood (routine x 2)     Status: None   Collection Time: 07/20/16 11:59 PM  Result Value Ref Range Status   Specimen Description BLOOD LEFT HAND  Final   Special Requests BOTTLES DRAWN AEROBIC AND ANAEROBIC 6ML  Final   Culture NO GROWTH 5 DAYS  Final   Report Status 07/26/2016 FINAL  Final  MRSA PCR Screening     Status: None   Collection Time: 07/21/16  4:43 AM  Result Value Ref Range Status   MRSA by PCR NEGATIVE NEGATIVE Final    Comment:        The GeneXpert MRSA Assay  (FDA approved for NASAL specimens only), is one component of a comprehensive MRSA colonization surveillance program. It is not intended to diagnose MRSA infection nor to guide or monitor treatment for MRSA infections.   Surgical PCR screen     Status: None   Collection Time: 07/23/16 12:00 AM  Result Value Ref Range Status   MRSA, PCR NEGATIVE NEGATIVE Final   Staphylococcus aureus NEGATIVE NEGATIVE Final    Comment:        The Xpert SA Assay (FDA approved for NASAL specimens in patients over 43 years of age), is one component of a comprehensive surveillance program.  Test performance has been validated by [facility] for patients greater than or equal to 39 year old. It is not intended to diagnose infection nor to guide or monitor treatment.   Aerobic/Anaerobic Culture (surgical/deep wound)     Status: None (Preliminary result)   Collection Time: 07/25/16  3:02 PM  Result Value Ref Range Status   Specimen Description TISSUE  Final   Special Requests SWAB OF LUMBAR EPIDURAL  Final   Gram Stain   Final    MODERATE WBC PRESENT, PREDOMINANTLY PMN MODERATE GRAM POSITIVE COCCI IN PAIRS FEW GRAM VARIABLE ROD  MODERATE GRAM POSITIVE RODS RARE GRAM NEGATIVE RODS    Culture NO GROWTH 4 DAYS  Final   Report Status PENDING  Incomplete  Acid Fast Smear (AFB)     Status: None   Collection Time: 07/25/16  3:02 PM  Result Value Ref Range Status   AFB Specimen Processing Concentration  Final   Acid Fast Smear Negative  Final    Comment: (NOTE) Performed At: [facility] [place], [place] [id number] Lindon Romp MD [id number]    Source (AFB) SWAB OF SOFT TISSUE LUMBAR EPIDUIRAL  Corrected    Comment: CORRECTED ON 11/14 AT [id number]: PREVIOUSLY REPORTED AS SWAB OF LUMBAR EPIDUIRAL  Aerobic/Anaerobic Culture (surgical/deep wound)     Status: None (Preliminary result)   Collection Time: 07/25/16  5:02 PM  Result Value Ref Range Status   Specimen Description  TISSUE  Final   Special Requests L4 L4 INTERVERTEBRAL DISC  Final   Gram Stain   Final    FEW WBC PRESENT, PREDOMINANTLY PMN FEW GRAM NEGATIVE RODS FEW GRAM VARIABLE ROD MODERATE GRAM POSITIVE COCCI IN PAIRS FEW GRAM POSITIVE RODS    Culture NO GROWTH 4 DAYS  Final   Report Status PENDING  Incomplete  Acid Fast Smear (AFB)     Status: None   Collection Time: 07/25/16  5:02 PM  Result Value Ref Range Status   AFB Specimen Processing Comment  Final    Comment: Tissue Grinding and Digestion/Decontamination   Acid Fast Smear Negative  Final    Comment: (NOTE) Performed At: [facility] [place], [place] [id number] Lindon Romp MD [id number]    Source (AFB) TISSUE  Final    Comment: L4,L5 INTERVERTEBRAL DISC    Impression/Plan:  1. Discitis with osteomyelitis - 8 weeks with vancomycin and ceftazidime (minimum). Will arrange follow up in [facility] in about 4 weeks 2. Rehab - ambulating.  Rehab tomorrow.    I will sign off, please call with questions.

## 2016-07-31 ENCOUNTER — Encounter (HOSPITAL_COMMUNITY): Payer: Self-pay | Admitting: Neurology

## 2016-07-31 ENCOUNTER — Inpatient Hospital Stay (HOSPITAL_COMMUNITY)
Admission: RE | Admit: 2016-07-31 | Discharge: 2016-08-08 | DRG: 560 | Disposition: A | Discharge status: Home Health | Payer: Medicare Other | Source: Intra-hospital | Attending: Physical Medicine & Rehabilitation | Admitting: Physical Medicine & Rehabilitation

## 2016-07-31 DIAGNOSIS — Z79899 Other long term (current) drug therapy: Secondary | ICD-10-CM | POA: Diagnosis not present

## 2016-07-31 DIAGNOSIS — Z981 Arthrodesis status: Secondary | ICD-10-CM | POA: Diagnosis not present

## 2016-07-31 DIAGNOSIS — Z87891 Personal history of nicotine dependence: Secondary | ICD-10-CM

## 2016-07-31 DIAGNOSIS — Z9889 Other specified postprocedural states: Secondary | ICD-10-CM

## 2016-07-31 DIAGNOSIS — Z91041 Radiographic dye allergy status: Secondary | ICD-10-CM

## 2016-07-31 DIAGNOSIS — N400 Enlarged prostate without lower urinary tract symptoms: Secondary | ICD-10-CM | POA: Diagnosis present

## 2016-07-31 DIAGNOSIS — Z96642 Presence of left artificial hip joint: Secondary | ICD-10-CM | POA: Diagnosis present

## 2016-07-31 DIAGNOSIS — M5416 Radiculopathy, lumbar region: Secondary | ICD-10-CM

## 2016-07-31 DIAGNOSIS — Z4789 Encounter for other orthopedic aftercare: Principal | ICD-10-CM

## 2016-07-31 DIAGNOSIS — G061 Intraspinal abscess and granuloma: Secondary | ICD-10-CM

## 2016-07-31 DIAGNOSIS — K5900 Constipation, unspecified: Secondary | ICD-10-CM | POA: Diagnosis present

## 2016-07-31 DIAGNOSIS — G8220 Paraplegia, unspecified: Secondary | ICD-10-CM | POA: Diagnosis present

## 2016-07-31 DIAGNOSIS — R531 Weakness: Secondary | ICD-10-CM | POA: Diagnosis present

## 2016-07-31 DIAGNOSIS — M4646 Discitis, unspecified, lumbar region: Secondary | ICD-10-CM | POA: Diagnosis not present

## 2016-07-31 DIAGNOSIS — G894 Chronic pain syndrome: Secondary | ICD-10-CM

## 2016-07-31 DIAGNOSIS — K5903 Drug induced constipation: Secondary | ICD-10-CM

## 2016-07-31 DIAGNOSIS — D62 Acute posthemorrhagic anemia: Secondary | ICD-10-CM | POA: Diagnosis present

## 2016-07-31 DIAGNOSIS — R269 Unspecified abnormalities of gait and mobility: Secondary | ICD-10-CM | POA: Diagnosis present

## 2016-07-31 DIAGNOSIS — Z419 Encounter for procedure for purposes other than remedying health state, unspecified: Secondary | ICD-10-CM

## 2016-07-31 DIAGNOSIS — Z8546 Personal history of malignant neoplasm of prostate: Secondary | ICD-10-CM | POA: Diagnosis not present

## 2016-07-31 DIAGNOSIS — G8918 Other acute postprocedural pain: Secondary | ICD-10-CM

## 2016-07-31 LAB — FUNGUS CULTURE RESULT

## 2016-07-31 LAB — BASIC METABOLIC PANEL
ANION GAP: 6 (ref 5–15)
BUN: 8 mg/dL (ref 6–20)
CHLORIDE: 110 mmol/L (ref 101–111)
CO2: 26 mmol/L (ref 22–32)
Calcium: 8.2 mg/dL — ABNORMAL LOW (ref 8.9–10.3)
Creatinine, Ser: 0.92 mg/dL (ref 0.61–1.24)
GFR calc non Af Amer: >60 mL/min (ref >60)
Glucose, Bld: 94 mg/dL (ref 65–99)
POTASSIUM: 3.5 mmol/L (ref 3.5–5.1)
SODIUM: 142 mmol/L (ref 135–145)

## 2016-07-31 LAB — FUNGUS CULTURE WITH STAIN

## 2016-07-31 LAB — FUNGAL ORGANISM REFLEX

## 2016-07-31 MED ORDER — ALUM & MAG HYDROXIDE-SIMETH 200-200-20 MG/5ML PO SUSP: 30.0000 mL | ORAL | Status: DC | PRN

## 2016-07-31 MED ORDER — TRAMADOL HCL 50 MG PO TABS
50.0000 mg | ORAL_TABLET | Freq: Four times a day (QID) | ORAL | Status: DC | PRN
  Filled 2016-07-31: qty 1

## 2016-07-31 MED ORDER — SODIUM CHLORIDE 0.9% FLUSH
10.0000 mL | INTRAVENOUS | Status: DC | PRN
  Administered 2016-08-02 – 2016-08-06 (×8): 10 mL
  Filled 2016-07-31 (×8): qty 40

## 2016-07-31 MED ORDER — ADULT MULTIVITAMIN W/MINERALS CH
1.0000 | ORAL_TABLET | Freq: Every day | ORAL | Status: DC
  Administered 2016-08-01 – 2016-08-08 (×8): 1 via ORAL
  Filled 2016-07-31 (×8): qty 1

## 2016-07-31 MED ORDER — MORPHINE SULFATE (PF) 2 MG/ML IV SOLN: 1.0000 mg | INTRAVENOUS | Status: DC | PRN

## 2016-07-31 MED ORDER — TRAZODONE HCL 50 MG PO TABS: 25.0000 mg | ORAL_TABLET | Freq: Every evening | ORAL | Status: DC | PRN

## 2016-07-31 MED ORDER — PROCHLORPERAZINE EDISYLATE 5 MG/ML IJ SOLN: 5.0000 mg | Freq: Four times a day (QID) | INTRAMUSCULAR | Status: DC | PRN

## 2016-07-31 MED ORDER — MENTHOL 3 MG MT LOZG
1.0000 | LOZENGE | OROMUCOSAL | Status: DC | PRN
  Filled 2016-07-31: qty 9

## 2016-07-31 MED ORDER — POLYETHYLENE GLYCOL 3350 17 G PO PACK
17.0000 g | PACK | Freq: Two times a day (BID) | ORAL | Status: DC
  Administered 2016-07-31 – 2016-08-07 (×10): 17 g via ORAL
  Filled 2016-07-31 (×15): qty 1

## 2016-07-31 MED ORDER — FERROUS SULFATE 325 (65 FE) MG PO TABS: 325.0000 mg | ORAL_TABLET | Freq: Two times a day (BID) | ORAL | Status: DC

## 2016-07-31 MED ORDER — POLYETHYLENE GLYCOL 3350 17 G PO PACK: 17.0000 g | PACK | Freq: Two times a day (BID) | ORAL | Status: DC

## 2016-07-31 MED ORDER — OXYCODONE-ACETAMINOPHEN 5-325 MG PO TABS
1.0000 | ORAL_TABLET | Freq: Four times a day (QID) | ORAL | Status: DC | PRN
  Administered 2016-08-04 – 2016-08-08 (×7): 2 via ORAL
  Filled 2016-07-31 (×7): qty 2

## 2016-07-31 MED ORDER — ZOLPIDEM TARTRATE 5 MG PO TABS
5.0000 mg | ORAL_TABLET | Freq: Every evening | ORAL | Status: DC | PRN
  Administered 2016-07-31 – 2016-08-07 (×8): 5 mg via ORAL
  Filled 2016-07-31 (×8): qty 1

## 2016-07-31 MED ORDER — HYDROXYZINE HCL 25 MG PO TABS
25.0000 mg | ORAL_TABLET | Freq: Three times a day (TID) | ORAL | Status: DC | PRN
  Filled 2016-07-31: qty 1

## 2016-07-31 MED ORDER — OXYCODONE HCL ER 10 MG PO T12A
20.0000 mg | EXTENDED_RELEASE_TABLET | Freq: Two times a day (BID) | ORAL | Status: DC
  Administered 2016-07-31 – 2016-08-03 (×7): 20 mg via ORAL
  Filled 2016-07-31 (×7): qty 2

## 2016-07-31 MED ORDER — OXYCODONE HCL ER 20 MG PO T12A: 20.0000 mg | EXTENDED_RELEASE_TABLET | Freq: Two times a day (BID) | ORAL | Status: DC

## 2016-07-31 MED ORDER — VANCOMYCIN HCL IN DEXTROSE 1-5 GM/200ML-% IV SOLN
1000.0000 mg | Freq: Two times a day (BID) | INTRAVENOUS | Status: DC
  Administered 2016-08-01 – 2016-08-07 (×13): 1000 mg via INTRAVENOUS
  Filled 2016-07-31 (×14): qty 200

## 2016-07-31 MED ORDER — GUAIFENESIN-DM 100-10 MG/5ML PO SYRP: 5.0000 mL | ORAL_SOLUTION | Freq: Four times a day (QID) | ORAL | Status: DC | PRN

## 2016-07-31 MED ORDER — BISACODYL 10 MG RE SUPP: 10.0000 mg | Freq: Every day | RECTAL | Status: DC | PRN

## 2016-07-31 MED ORDER — FLEET ENEMA 7-19 GM/118ML RE ENEM: 1.0000 | ENEMA | Freq: Once | RECTAL | Status: DC | PRN

## 2016-07-31 MED ORDER — FERROUS SULFATE 325 (65 FE) MG PO TABS
325.0000 mg | ORAL_TABLET | Freq: Two times a day (BID) | ORAL | Status: DC
  Administered 2016-08-01 – 2016-08-08 (×15): 325 mg via ORAL
  Filled 2016-07-31 (×15): qty 1

## 2016-07-31 MED ORDER — ALUM & MAG HYDROXIDE-SIMETH 200-200-20 MG/5ML PO SUSP: 30.0000 mL | Freq: Four times a day (QID) | ORAL | Status: DC | PRN

## 2016-07-31 MED ORDER — DIPHENHYDRAMINE HCL 12.5 MG/5ML PO ELIX: 12.5000 mg | ORAL_SOLUTION | Freq: Four times a day (QID) | ORAL | Status: DC | PRN

## 2016-07-31 MED ORDER — SENNOSIDES-DOCUSATE SODIUM 8.6-50 MG PO TABS: 1.0000 | ORAL_TABLET | Freq: Two times a day (BID) | ORAL | Status: DC

## 2016-07-31 MED ORDER — DEXTROSE 5 % IV SOLN: 2.0000 g | Freq: Three times a day (TID) | INTRAVENOUS | Status: DC

## 2016-07-31 MED ORDER — VANCOMYCIN HCL IN DEXTROSE 1-5 GM/200ML-% IV SOLN: 1000.0000 mg | Freq: Two times a day (BID) | INTRAVENOUS | Status: DC

## 2016-07-31 MED ORDER — ONDANSETRON HCL 4 MG/2ML IJ SOLN: 4.0000 mg | INTRAMUSCULAR | Status: DC | PRN

## 2016-07-31 MED ORDER — SODIUM CHLORIDE 0.9% FLUSH
10.0000 mL | Freq: Two times a day (BID) | INTRAVENOUS | Status: DC
  Administered 2016-08-01 – 2016-08-06 (×3): 10 mL

## 2016-07-31 MED ORDER — PHENOL 1.4 % MT LIQD: 1.0000 | OROMUCOSAL | Status: DC | PRN

## 2016-07-31 MED ORDER — PROCHLORPERAZINE MALEATE 5 MG PO TABS: 5.0000 mg | ORAL_TABLET | Freq: Four times a day (QID) | ORAL | Status: DC | PRN

## 2016-07-31 MED ORDER — CYCLOBENZAPRINE HCL 5 MG PO TABS
5.0000 mg | ORAL_TABLET | Freq: Three times a day (TID) | ORAL | Status: DC | PRN
  Administered 2016-08-07: 5 mg via ORAL
  Filled 2016-07-31: qty 1

## 2016-07-31 MED ORDER — DEXTROSE 5 % IV SOLN
2.0000 g | Freq: Three times a day (TID) | INTRAVENOUS | Status: DC
  Administered 2016-08-01 – 2016-08-08 (×23): 2 g via INTRAVENOUS
  Filled 2016-07-31 (×29): qty 2

## 2016-07-31 MED ORDER — ACETAMINOPHEN 325 MG PO TABS: 325.0000 mg | ORAL_TABLET | ORAL | Status: DC | PRN

## 2016-07-31 MED ORDER — PROCHLORPERAZINE 25 MG RE SUPP: 12.5000 mg | Freq: Four times a day (QID) | RECTAL | Status: DC | PRN

## 2016-07-31 NOTE — Discharge Summary (Signed)
Physician Discharge Summary  Donald [name] [id number] DOB: Dec 10, 1940 DOA: 07/20/2016  PCP: Donald Agreste, MD  Admit date: 07/20/2016 Discharge date: 07/31/2016  Admitted From: Home Disposition:  Inpatient rehab  Recommendations for Outpatient Follow-up:  1. Follow up with orthopedic surgery 2. Infectious disease recommendation: Vancomycin and ceftazidime x8 weeks total. 7 weeks remaining. Follow-up at [facility] in 4 weeks 3. Recheck CBC in a few days for post op anemia  Discharge Condition: Stable CODE STATUS: Full code  Brief/Interim Summary:  HPI written by Dr. Clydene [name]  HPI:  [name] is a 75 year old Caucasian male past history significant for prostate cancer status post prostatectomy, and spinal stenosis status post L3/L4 and L4/L5 decompression on 720/2017 by Dr. Lynann [name]. 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 He was placed on vancomycin and ceftriaxone to complete an 8 week course of home antibiotics via right upper charity PICC line. Patient states he was initially recovering well at home. Approximate 1 week ago, he was ambulating with his walker and felt he was recovering as expected. However, approximately 4 days ago, patient noticed a dull aching sensation in the left hip. This was associated with muscle spasms and a shooting sensation down his left lower extremity. Over the past several days, the pain progressed to an intense, sharp pain in the lumbar region. No alleviating or exacerbating factors. The pain progressed to the point of decreased ability to ambulate. Patient denies any fever chills at home. Patient states he was very adherent to the outpatient antibiotic regimen. He had a home health nurse visit. No bowel or bladder incontinence. No redness or pain along the PICC line insertion site. No other recent changes in medications.   ED Course:  Patient was hemodynamically  stable in the emergency department. He was afebrile, heart rate in the 60s, and nl blood pressures. MR of the lumbar spine showed abscess within the L4-L5 laminectomy site that has enlarged since last imaging test. The ER physician notified Dr. Berenice [name], who discussed the case with Dr. Lynann [name]. The surgical team is potentially planning for possible surgical intervention tomorrow morning.   Hospital course:  L4/5 discitis with osteomyelitis with recurrent abscess Patient started on vancomycin and ceftazidime. He underwent debridement, laminectomy, posterior spinal fusion (L3-4,L4-5,L5-S1) on 11/14. Gram stain significant for multiple microorganisms and culture did not grow an organism. He received 7 days of vancomycin and ceftazidime. Infectious disease was following and recommended Vancomycin and ceftazidime x8 weeks total. Follow-up at [facility] in 4 weeks. Patient needs 7 more weeks of antibiotics. Patient worked with PT and CIR was recommended for continued rehab.  Candidiasis Patient initially on Lamisil and switched to Diflucan. Completed course.  Microcytic anemia Stable post op.   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   cefTAZidime 2 g in dextrose 5 % 50 mL Inject 2 g into the vein every 8 (eight) hours.   ferrous sulfate 325 (65 FE) MG tablet Take 1 tablet (325 mg total) by mouth 2 (two) times daily with a meal.   methocarbamol 500 MG tablet Commonly known as:  ROBAXIN Take 500 mg by mouth 3 (three) times daily.   morphine 2 MG/ML injection Inject 0.5-2 mLs (1-4 mg total) into the vein every 3 (three) hours as needed.   multivitamin capsule Take 1 capsule by mouth daily.   ondansetron 4 MG/2ML Soln injection Commonly known as:  ZOFRAN Inject 2 mLs (4 mg total) into the vein every 4 (four) hours as needed for nausea or vomiting.   oxyCODONE 20 mg 12 hr tablet Commonly known as:  OXYCONTIN Take 1 tablet (20 mg total) by mouth every 12 (twelve) hours.   oxyCODONE-acetaminophen 5-325 MG tablet Commonly known as:  PERCOCET/ROXICET Take 1-2 tablets by mouth every 6 (six) hours as needed for severe pain.   polyethylene glycol packet Commonly known as:  MIRALAX / GLYCOLAX Take 17 g by mouth 2 (two) times daily.   senna-docusate 8.6-50 MG tablet Commonly known as:  Senokot-S Take 1 tablet by mouth 2 (two) times daily.   vancomycin 1-5 GM/200ML-% Soln Commonly known as:  VANCOCIN Inject 200 mLs (1,000 mg total) into the vein every 12 (twelve) hours. Start taking on:  08/01/2016 What changed:  additional instructions   zolpidem 10 MG tablet Commonly known as:  AMBIEN TAKE 1/2 TABLET AT BEDTIME AS NEEDED FOR SLEEP.      Follow-up Information    [facility] Follow up.   Why:  Someone from [place] will contact you to arrange start date and time for therapy. Contact information: [place] [address] [phone]          Allergies  Allergen Reactions  . Iodine Anaphylaxis  . Ivp Dye [Iodinated Diagnostic Agents] Anaphylaxis    Consultations:  Orthopedic surgery  Infectious  disease  Inpatient rehab   Procedures/Studies: Dg Lumbar Spine 2-3 Views  Result Date: 07/25/2016 CLINICAL DATA:  Lumbar spinal fixation at L3-S1.  Initial encounter. EXAM: LUMBAR SPINE - 2-3 VIEW COMPARISON:  None. FINDINGS: Two fluoroscopic C-arm images are provided from the OR, demonstrating lumbar spinal fusion at L3-S1. Underlying decompression is noted. The patient is status post sacroiliac spinal fusion. IMPRESSION: Status post lumbar spinal fusion at L3-S1. Electronically Signed   By: Garald Balding M.D.   On: 07/25/2016 22:45   Ct Lumbar Spine Wo Contrast  Result Date: 07/21/2016 CLINICAL DATA:  Postoperative infection.  Lumbar surgery in July. EXAM: CT LUMBAR SPINE WITHOUT CONTRAST TECHNIQUE: Multidetector CT imaging of the lumbar spine was performed without intravenous contrast administration. Multiplanar CT image reconstructions were also generated. COMPARISON:  MRI from yesterday FINDINGS: Segmentation: Standard and as numbered yesterday. Alignment: L4-5 retrolisthesis. Vertebrae: Extensive irregular endplate erosion around the L4-5 disc with disc widening from abscess seen by MRI yesterday. Facet joints are widened at this level, without erosive changes. Laminectomy centered at L4 with diffuse soft tissue stranding, an abscess is noted in this region by MRI. No fracture. No underlying bone lesion. Paraspinal and other soft tissues: Extensive inflammation around the L4-5 discitis and L4-5 laminectomy abscess. Disc  levels: T12- L1: Unremarkable. L1-L2: Degenerative disc narrowing and endplate ridging with facet hypertrophy. Subarticular recess narrowing. L2-L3: Advanced facet arthropathy with hypertrophy and bilateral subarticular recess stenosis. Mild spondylosis. L3-L4: Advanced facet arthropathy with hypertrophy and bilateral subarticular recess narrowing. Spondylosis. L4-L5: Advanced facet arthropathy. Changes of decompression and infection described above. Advanced foraminal narrowing,  especially on the right. L5-S1:Hyper trophic facet arthropathy.  Mild annulus bulging. IMPRESSION: Active L4-5 discitis with discal abscess. L4 laminectomy abscess not well-defined compared to MRI yesterday. L4-5 retrolisthesis with canal and biforaminal impingement. Electronically Signed   By: Monte Fantasia M.D.   On: 07/21/2016 09:21   Mr Lumbar Spine W Wo Contrast  Result Date: 07/20/2016 CLINICAL DATA:  75 year old male with chronic postoperative infection. Pain. Surgery 03/30/2016. EXAM: MRI LUMBAR SPINE WITHOUT AND WITH CONTRAST TECHNIQUE: Multiplanar and multiecho pulse sequences of the lumbar spine were obtained without and with intravenous contrast. CONTRAST:  77mL MULTIHANCE GADOBENATE DIMEGLUMINE 529 MG/ML IV SOLN COMPARISON:  06/29/2016, 05/29/2016 and 12/25/2015 CT. FINDINGS: Segmentation: Last fully open disk space is labeled L5-S1. Present examination incorporates from T11 through upper S3 level. Alignment:  Slight curvature. Post L3-4 and L4-5 laminectomy. Discitis/ osteomyelitis L4-5 level with interval further destruction of the L4 and L5 vertebra. 50% loss of height L5 and 20% maximal loss of height of L4. Abscess within the L4-5 laminectomy site has enlarged measuring 2.3 x 1.9 x 1.1 cm versus prior 1 x 1.6 x 1.1 cm. Infectious process surrounds the thecal sac in a circumferential fashion extending from the upper L4 to the upper S1 level. This is most prominent in the ventral epidural space without central fluid containing collection as seen with epidural abscess. This narrows the thecal sac. Infectious process extends into the L4-5 and upper aspect of the L5-S1 neural foramen bilaterally. Paraspinal extension of infection into the psoas muscle bilaterally. This inflammatory process extends inferiorly along the psoas muscles and prevertebral region from L4 through lower sacrum. Paraspinal muscle infection L2-3 through L5-S1. Conus medullaris: Extends to the L1 level. Enhancement of the  right L5 nerve root may be related to neuritis rather than arachnoiditis. 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 Facet degenerative changes. Small Schmorl's node deformity. L2-3: Facet bony overgrowth. Short pedicles. Mild slightly moderate spinal stenosis. L3-4:  Posterior decompression with superimposed infection. L4-5: Post decompression with superimposed infection as detailed above. L5-S1: Prominent facet degenerative changes and bony overgrowth. Bulge. Moderate bilateral foraminal narrowing. Lateral osteophyte greater on the right. IMPRESSION: Progressive discitis/osteomyelitis with further destruction of the L4 and L5 vertebral body. Increase in size of abscess within the L4-5 laminectomy site. Significant extension of inflammatory process as detailed above. Please see above for further detail. Electronically Signed   By: Genia Del M.D.   On: 07/20/2016 19:36   Dg Lumbar Spine 1 View  Result Date: 07/25/2016 CLINICAL DATA:  L3-S1 posterior fusion EXAM: LUMBAR SPINE - 1 VIEW COMPARISON:  Lumbar MRI July 20, 2016; lumbar spine CT July 21, 2016 FINDINGS: Cross-table lateral lumbar image time stamped 14:32:12 submitted. No metallic marker present. Evidence of discitis at L4-5, noted on recent studies. IMPRESSION: Evidence of discitis at L4-5 with erosion along the superior aspect of the L5 vertebral body. No metallic marker evident. Electronically Signed   By: Lowella Grip III M.D.   On: 07/25/2016 18:06   Dg Lumbar Spine 1 View  Result Date: 07/22/2016 CLINICAL DATA:  L4-5 discitis/osteomyelitis EXAM: LUMBAR SPINE - 1 VIEW  COMPARISON:  07/20/2016, 07/21/2016 FINDINGS: Normal alignment. Osseous destruction at the L4-5 endplates compatible with known ongoing L4-5 discitis and osteomyelitis. Postop changes from previous spinal surgery at L3-L5. No focal kyphosis or severe compression fracture. No subluxation or  dislocation. IMPRESSION: L4-5 discitis and osteomyelitis, better demonstrated by recent MR and CT. No gross malalignment. Electronically Signed   By: Jerilynn Mages.  Shick M.D.   On: 07/22/2016 11:31   Dg C-arm Gt 120 Min  Result Date: 07/25/2016 CLINICAL DATA:  Lumbar spinal fixation at L3-S1.  Initial encounter. EXAM: LUMBAR SPINE - 2-3 VIEW COMPARISON:  None. FINDINGS: Two fluoroscopic C-arm images are provided from the OR, demonstrating lumbar spinal fusion at L3-S1. Underlying decompression is noted. The patient is status post sacroiliac spinal fusion. IMPRESSION: Status post lumbar spinal fusion at L3-S1. Electronically Signed   By: Garald Balding M.D.   On: 07/25/2016 22:45     Dedebridement, laminectomy, posterior spinal fusion (11/14)   Subjective: Back pain still present. Planning to get out of bed today. No other concerns.  Discharge Exam: Vitals:   07/31/16 0346 07/31/16 1257  BP: 137/78 126/74  Pulse: 63 64  Resp:  16  Temp: 98.3 F (36.8 C) 98.1 F (36.7 C)   Vitals:   07/30/16 2033 07/31/16 0346 07/31/16 1200 07/31/16 1257  BP: 123/69 137/78  126/74  Pulse: 62 63  64  Resp:    16  Temp: 98.5 F (36.9 C) 98.3 F (36.8 C)  98.1 F (36.7 C)  TempSrc: Oral Oral  Oral  SpO2: 96% 98%  97%  Weight:   88 kg (193 lb 14.4 oz)   Height:   6' (1.829 m)     General exam: Appears calm and comfortable Respiratory system: Clear to auscultation. Respiratory effort normal. Cardiovascular system: S1 & S2 heard, RRR. No murmurs, rubs, gallops or clicks. Gastrointestinal system: Abdomen is nondistended, soft and nontender. Normal bowel sounds heard. Central nervous system: Alert and oriented. No focal neurological deficits. Extremities: No edema. No calf tenderness Skin: No cyanosis. No rashes Psychiatry: Judgement and insight appear normal. Mood & affect appropriate.    The results of significant diagnostics from this hospitalization (including imaging, microbiology, ancillary and  laboratory) are listed below for reference.     Microbiology: Recent Results (from the past 240 hour(s))  Surgical PCR screen     Status: None   Collection Time: 07/23/16 12:00 AM  Result Value Ref Range Status   MRSA, PCR NEGATIVE NEGATIVE Final   Staphylococcus aureus NEGATIVE NEGATIVE Final    Comment:        The Xpert SA Assay (FDA approved for NASAL specimens in patients over 35 years of age), is one component of a comprehensive surveillance program.  Test performance has been validated by [facility] for patients greater than or equal to 69 year old. It is not intended to diagnose infection nor to guide or monitor treatment.   Aerobic/Anaerobic Culture (surgical/deep wound)     Status: None (Preliminary result)   Collection Time: 07/25/16  3:02 PM  Result Value Ref Range Status   Specimen Description TISSUE  Final   Special Requests SWAB OF LUMBAR EPIDURAL  Final   Gram Stain   Final    CORRECTED RESULTS MODERATE WBC PRESENT, PREDOMINANTLY PMN NO ORGANISMS SEEN PREVIOUSLY REPORTED AS: RARE GRAM NEGATIVE RODS MODERATE GRAM POSITIVE RODS FEW GRAM VARIABLE ROD MODERATE GRAM POSITIVE COCCI IN PAIRS CORRECTED RESULTS CALLED TO: RN D.JOHNSON 07/31/16 1255 MLM    Culture NO GROWTH  4 DAYS  Final   Report Status PENDING  Incomplete  Acid Fast Smear (AFB)     Status: None   Collection Time: 07/25/16  3:02 PM  Result Value Ref Range Status   AFB Specimen Processing Concentration  Final   Acid Fast Smear Negative  Final    Comment: (NOTE) Performed At: [facility] [place], [place] [id number] Lindon Romp MD [id number]    Source (AFB) SWAB OF SOFT TISSUE LUMBAR EPIDUIRAL  Corrected    Comment: CORRECTED ON 11/14 AT 1727: PREVIOUSLY REPORTED AS SWAB OF LUMBAR EPIDUIRAL  Aerobic/Anaerobic Culture (surgical/deep wound)     Status: None (Preliminary result)   Collection Time: 07/25/16  5:02 PM  Result Value Ref Range Status   Specimen  Description TISSUE  Final   Special Requests L4 L4 INTERVERTEBRAL DISC  Final   Gram Stain   Final    CORRECTED RESULTS FEW WBC PRESENT, PREDOMINANTLY PMN RARE GRAM NEGATIVE RODS PREVIOUSLY REPORTED AS: FEW GRAM POSITIVE RODS MODERATE GRAM POSITIVE COCCI IN PAIRS FEW GRAM VARIABLE ROD FEW GRAM NEGATIVE RODS CORRECTED RESULTS CALLED TO: RN D.JOHNSON 07/31/16 1255 MLM    Culture NO GROWTH 4 DAYS  Final   Report Status PENDING  Incomplete  Acid Fast Smear (AFB)     Status: None   Collection Time: 07/25/16  5:02 PM  Result Value Ref Range Status   AFB Specimen Processing Comment  Final    Comment: Tissue Grinding and Digestion/Decontamination   Acid Fast Smear Negative  Final    Comment: (NOTE) Performed At: [facility] [place], [place] [id number] Lindon Romp MD [id number]    Source (AFB) TISSUE  Final    Comment: L4,L5 INTERVERTEBRAL DISC     Labs: BNP (last 3 results) No results for input(s): BNP in the last 8760 hours. Basic Metabolic Panel:  Recent Labs Lab 07/25/16 1821 07/25/16 2031 07/26/16 0327 07/29/16 0830 07/31/16 0515  NA 140 139 137 142 142  K 4.2 4.3 4.3 3.5 3.5  CL  --   --  103 109 110  CO2  --   --  25 27 26   GLUCOSE 157* 191* 160* 139* 94  BUN  --   --  10 6 8   CREATININE  --   --  0.78 0.85 0.92  CALCIUM  --   --  8.4* 8.4* 8.2*   Liver Function Tests: No results for input(s): AST, ALT, ALKPHOS, BILITOT, PROT, ALBUMIN in the last 168 hours. No results for input(s): LIPASE, AMYLASE in the last 168 hours. No results for input(s): AMMONIA in the last 168 hours. CBC:  Recent Labs Lab 07/25/16 2031 07/26/16 0327 07/27/16 0457 07/28/16 0839 07/29/16 0830  WBC  --  11.9* 9.5  --  6.2  HGB 10.2* 9.2* 7.5* 9.0* 9.1*  HCT 30.0* 29.6* 24.8* 28.7* 28.9*  MCV  --  77.1* 78.7  --  80.3  PLT  --  303 255  --  210   Cardiac Enzymes: No results for input(s): CKTOTAL, CKMB, CKMBINDEX, TROPONINI in the last 168  hours. BNP: Invalid input(s): POCBNP CBG: No results for input(s): GLUCAP in the last 168 hours. D-Dimer No results for input(s): DDIMER in the last 72 hours. Hgb A1c No results for input(s): HGBA1C in the last 72 hours. Lipid Profile No results for input(s): CHOL, HDL, LDLCALC, TRIG, CHOLHDL, LDLDIRECT in the last 72 hours. Thyroid function studies No results for input(s): TSH, T4TOTAL, T3FREE, THYROIDAB in  the last 72 hours.  Invalid input(s): FREET3 Anemia work up No results for input(s): VITAMINB12, FOLATE, FERRITIN, TIBC, IRON, RETICCTPCT in the last 72 hours. Urinalysis    Component Value Date/Time   COLORURINE YELLOW 06/29/2016 0924   APPEARANCEUR CLOUDY (A) 06/29/2016 0924   LABSPEC 1.036 (H) 06/29/2016 0924   PHURINE 6.0 06/29/2016 0924   GLUCOSEU NEGATIVE 06/29/2016 0924   HGBUR NEGATIVE 06/29/2016 0924   BILIRUBINUR NEGATIVE 06/29/2016 0924   BILIRUBINUR small (A) 06/26/2016 1335   BILIRUBINUR neg 12/02/2012 1522   KETONESUR NEGATIVE 06/29/2016 0924   PROTEINUR NEGATIVE 06/29/2016 0924   UROBILINOGEN 0.2 06/26/2016 1335   UROBILINOGEN 0.2 09/12/2013 0916   NITRITE NEGATIVE 06/29/2016 0924   LEUKOCYTESUR NEGATIVE 06/29/2016 0924   Sepsis Labs Invalid input(s): PROCALCITONIN,  WBC,  LACTICIDVEN Microbiology Recent Results (from the past 240 hour(s))  Surgical PCR screen     Status: None   Collection Time: 07/23/16 12:00 AM  Result Value Ref Range Status   MRSA, PCR NEGATIVE NEGATIVE Final   Staphylococcus aureus NEGATIVE NEGATIVE Final    Comment:        The Xpert SA Assay (FDA approved for NASAL specimens in patients over 28 years of age), is one component of a comprehensive surveillance program.  Test performance has been validated by [facility] for patients greater than or equal to 40 year old. It is not intended to diagnose infection nor to guide or monitor treatment.   Aerobic/Anaerobic Culture (surgical/deep wound)     Status: None  (Preliminary result)   Collection Time: 07/25/16  3:02 PM  Result Value Ref Range Status   Specimen Description TISSUE  Final   Special Requests SWAB OF LUMBAR EPIDURAL  Final   Gram Stain   Final    CORRECTED RESULTS MODERATE WBC PRESENT, PREDOMINANTLY PMN NO ORGANISMS SEEN PREVIOUSLY REPORTED AS: RARE GRAM NEGATIVE RODS MODERATE GRAM POSITIVE RODS FEW GRAM VARIABLE ROD MODERATE GRAM POSITIVE COCCI IN PAIRS CORRECTED RESULTS CALLED TO: RN D.JOHNSON 07/31/16 1255 MLM    Culture NO GROWTH 4 DAYS  Final   Report Status PENDING  Incomplete  Acid Fast Smear (AFB)     Status: None   Collection Time: 07/25/16  3:02 PM  Result Value Ref Range Status   AFB Specimen Processing Concentration  Final   Acid Fast Smear Negative  Final    Comment: (NOTE) Performed At: [facility] [place], [place] [id number] Lindon Romp MD [id number]    Source (AFB) SWAB OF SOFT TISSUE LUMBAR EPIDUIRAL  Corrected    Comment: CORRECTED ON 11/14 AT 1727: PREVIOUSLY REPORTED AS SWAB OF LUMBAR EPIDUIRAL  Aerobic/Anaerobic Culture (surgical/deep wound)     Status: None (Preliminary result)   Collection Time: 07/25/16  5:02 PM  Result Value Ref Range Status   Specimen Description TISSUE  Final   Special Requests L4 L4 INTERVERTEBRAL DISC  Final   Gram Stain   Final    CORRECTED RESULTS FEW WBC PRESENT, PREDOMINANTLY PMN RARE GRAM NEGATIVE RODS PREVIOUSLY REPORTED AS: FEW GRAM POSITIVE RODS MODERATE GRAM POSITIVE COCCI IN PAIRS FEW GRAM VARIABLE ROD FEW GRAM NEGATIVE RODS CORRECTED RESULTS CALLED TO: RN D.JOHNSON 07/31/16 1255 MLM    Culture NO GROWTH 4 DAYS  Final   Report Status PENDING  Incomplete  Acid Fast Smear (AFB)     Status: None   Collection Time: 07/25/16  5:02 PM  Result Value Ref Range Status   AFB Specimen Processing Comment  Final  Comment: Tissue Grinding and Digestion/Decontamination   Acid Fast Smear Negative  Final    Comment: (NOTE) Performed At:  [facility] [place], [place] [id number] Lindon Romp MD [id number]    Source (AFB) TISSUE  Final    Comment: L4,L5 INTERVERTEBRAL DISC     Time coordinating discharge: Over 30 minutes  SIGNED:   Cordelia Poche, MD [facility] 07/31/2016, 3:56 PM Pager [phone]  If 7PM-7AM, please contact night-coverage [email or web address] Password [email or web address]

## 2016-07-31 NOTE — Progress Notes (Signed)
PROGRESS NOTE    Donald [name]  [id number] DOB: Sep 25, 1940 DOA: 07/20/2016 PCP: [name], MD   Brief Narrative: Donald [name] is a 75 y.o. male with a history of prostate cancer s/p prostatectomy and spinal stenosis s/p L3-4, L4-5 decompression. Patient presented with left hip pain and weakness and found to have a recurrent epidural abscess.   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 Gram stain significant for multiple microorganisms -ID recommendations: Vancomycin and ceftazidime x8 weeks total. Follow-up at [facility] in 4 weeks -follow-up tissue cultures (no growth x4 days) -continue analgesics prn -PT recommendations: CIR  Candidiasis -discontinue fluconazole  Microcytic anemia Stable   DVT prophylaxis: SCDs Code Status: Full code Family Communication: Wife and son at bedside Disposition Plan: Discharge to CIR pending infectious disease recommendations for outpatient antibiotic regimen   Consultants:   Infectious disease  Orthopedic surgery  Physical medicine  Procedures:   Ddebridement, laminectomy, posterior spinal fusion (11/14)  Antimicrobials:  Ceftriaxone (11/10)  Fluconazole (11/10>>11/20)  Ceftazidime (11/10>>  Vancomycin (11/10>>11/16)   Subjective: Back pain still present. Planning to get out of bed today. No other concerns.  Objective: Vitals:   07/30/16 2033 07/31/16 0346 07/31/16 1200 07/31/16 1257  BP: 123/69 137/78  126/74  Pulse: 62 63  64  Resp:    16  Temp: 98.5 F (36.9 C) 98.3 F (36.8 C)  98.1 F (36.7 C)  TempSrc: Oral Oral   Oral  SpO2: 96% 98%  97%  Weight:   88 kg (193 lb 14.4 oz)   Height:   6' (1.829 m)     Intake/Output Summary (Last 24 hours) at 07/31/16 1326 Last data filed at 07/31/16 0500  Gross per 24 hour  Intake             1000 ml  Output              250 ml  Net              750 ml   Filed Weights   07/31/16 1200  Weight: 88 kg (193 lb 14.4 oz)    Examination:  General exam: Appears calm and comfortable Respiratory system: Clear to auscultation. Respiratory effort normal. Cardiovascular system: S1 & S2 heard, RRR. No murmurs, rubs, gallops or clicks. Gastrointestinal system: Abdomen is nondistended, soft and nontender. Normal bowel sounds heard. Central nervous system: Alert and oriented. No focal neurological deficits. Extremities: No edema. No calf tenderness Skin: No cyanosis. No rashes Psychiatry: Judgement and insight appear normal. Mood & affect appropriate.     Data Reviewed: I have personally reviewed following labs and imaging studies  CBC:  Recent Labs Lab 07/25/16 2031 07/26/16 0327 07/27/16 0457 07/28/16 0839 07/29/16 0830  WBC  --  11.9* 9.5  --  6.2  HGB 10.2* 9.2* 7.5* 9.0* 9.1*  HCT 30.0* 29.6* 24.8* 28.7* 28.9*  MCV  --  77.1* 78.7  --  80.3  PLT  --  303 255  --  [id number]   Basic Metabolic Panel:  Recent Labs Lab 07/25/16 1821 07/25/16 2031 07/26/16 0327 07/29/16 0830 07/31/16 0515  NA 140 139 137 142  142  K 4.2 4.3 4.3 3.5 3.5  CL  --   --  103 109 110  CO2  --   --  25 27 26   GLUCOSE 157* 191* 160* 139* 94  BUN  --   --  10 6 8   CREATININE  --   --  0.78 0.85 0.92  CALCIUM  --   --  8.4* 8.4* 8.2*   GFR: Estimated Creatinine Clearance: 78.5 mL/min (by C-G formula based on SCr of 0.92 mg/dL). Liver Function Tests: No results for input(s): AST, ALT, ALKPHOS, BILITOT, PROT, ALBUMIN in the last 168 hours. No results for input(s): LIPASE, AMYLASE in the last 168 hours. No results for input(s): AMMONIA in the last 168 hours. Coagulation  Profile: No results for input(s): INR, PROTIME in the last 168 hours. Cardiac Enzymes: No results for input(s): CKTOTAL, CKMB, CKMBINDEX, TROPONINI in the last 168 hours. BNP (last 3 results) No results for input(s): PROBNP in the last 8760 hours. HbA1C: No results for input(s): HGBA1C in the last 72 hours. CBG: No results for input(s): GLUCAP in the last 168 hours. Lipid Profile: No results for input(s): CHOL, HDL, LDLCALC, TRIG, CHOLHDL, LDLDIRECT in the last 72 hours. Thyroid Function Tests: No results for input(s): TSH, T4TOTAL, FREET4, T3FREE, THYROIDAB in the last 72 hours. Anemia Panel: No results for input(s): VITAMINB12, FOLATE, FERRITIN, TIBC, IRON, RETICCTPCT in the last 72 hours. Sepsis Labs: No results for input(s): PROCALCITON, LATICACIDVEN in the last 168 hours.  Recent Results (from the past 240 hour(s))  Surgical PCR screen     Status: None   Collection Time: 07/23/16 12:00 AM  Result Value Ref Range Status   MRSA, PCR NEGATIVE NEGATIVE Final   Staphylococcus aureus NEGATIVE NEGATIVE Final    Comment:        The Xpert SA Assay (FDA approved for NASAL specimens in patients over 61 years of age), is one component of a comprehensive surveillance program.  Test performance has been validated by [facility] for patients greater than or equal to 32 year old. It is not intended to diagnose infection nor to guide or monitor treatment.   Aerobic/Anaerobic Culture (surgical/deep wound)     Status: None (Preliminary result)   Collection Time: 07/25/16  3:02 PM  Result Value Ref Range Status   Specimen Description TISSUE  Final   Special Requests SWAB OF LUMBAR EPIDURAL  Final   Gram Stain   Final    CORRECTED RESULTS MODERATE WBC PRESENT, PREDOMINANTLY PMN NO ORGANISMS SEEN PREVIOUSLY REPORTED AS: RARE GRAM NEGATIVE RODS MODERATE GRAM POSITIVE RODS FEW GRAM VARIABLE ROD MODERATE GRAM POSITIVE COCCI IN PAIRS CORRECTED RESULTS CALLED TO: RN D.JOHNSON 07/31/16  1255 MLM    Culture NO GROWTH 4 DAYS  Final   Report Status PENDING  Incomplete  Acid Fast Smear (AFB)     Status: None   Collection Time: 07/25/16  3:02 PM  Result Value Ref Range Status   AFB Specimen Processing Concentration  Final   Acid Fast Smear Negative  Final    Comment: (NOTE) Performed At: [facility] [place], [place] [id number] Lindon Romp MD [id number]    Source (AFB) SWAB OF SOFT TISSUE LUMBAR EPIDUIRAL  Corrected    Comment: CORRECTED ON 11/14 AT 1727: PREVIOUSLY REPORTED AS SWAB OF LUMBAR EPIDUIRAL  Aerobic/Anaerobic Culture (surgical/deep wound)     Status: None (Preliminary result)   Collection Time: 07/25/16  5:02 PM  Result Value Ref Range Status  Specimen Description TISSUE  Final   Special Requests L4 L4 INTERVERTEBRAL DISC  Final   Gram Stain   Final    CORRECTED RESULTS FEW WBC PRESENT, PREDOMINANTLY PMN RARE GRAM NEGATIVE RODS PREVIOUSLY REPORTED AS: FEW GRAM POSITIVE RODS MODERATE GRAM POSITIVE COCCI IN PAIRS FEW GRAM VARIABLE ROD FEW GRAM NEGATIVE RODS CORRECTED RESULTS CALLED TO: RN D.JOHNSON 07/31/16 1255 MLM    Culture NO GROWTH 4 DAYS  Final   Report Status PENDING  Incomplete  Acid Fast Smear (AFB)     Status: None   Collection Time: 07/25/16  5:02 PM  Result Value Ref Range Status   AFB Specimen Processing Comment  Final    Comment: Tissue Grinding and Digestion/Decontamination   Acid Fast Smear Negative  Final    Comment: (NOTE) Performed At: [facility] [address] [place], [place] [id number] Lindon Romp MD [id number]    Source (AFB) TISSUE  Final    Comment: L4,L5 INTERVERTEBRAL [place]         Radiology Studies: No results found.      Scheduled Meds: . sodium chloride   Intravenous Once  . cefTAZidime (FORTAZ)  IV  2 g Intravenous Q8H  . ferrous sulfate  325 mg Oral BID WC  . fluconazole  100 mg Oral Daily  . mouth rinse  15 mL Mouth Rinse BID  . multivitamin with  minerals  1 tablet Oral Daily  . oxyCODONE  20 mg Oral Q12H  . polyethylene glycol  17 g Oral BID  . senna-docusate  1 tablet Oral BID  . sodium chloride flush  3 mL Intravenous Q12H  . sodium chloride flush  3 mL Intravenous Q12H  . vancomycin  1,000 mg Intravenous Q12H   Continuous Infusions: . sodium chloride 75 mL/hr at 07/31/16 0006  . sodium chloride       LOS: 11 days     Cordelia Poche [facility] 07/31/2016, 1:26 PM Pager: [phone]  If 7PM-7AM, please contact night-coverage [email or web address] Password [email or web address] 07/31/2016, 1:26 PM

## 2016-07-31 NOTE — H&P (Addendum)
Physical Medicine and Rehabilitation Admission H&P    Chief Complaint  Patient presents with  . Lumbar diskitis with radiculopathy.     HPI:  Donald [name] is a 75 y.o. male with history of prostate cancer, lumbar stenosis s/p L3-L5 decompression 03/30/16 who started developing BLE pain with spasms and was found to have L4-L5 osteomyelitis with epidural abscess. He was admitted for I & D of lumbar wound on 06/29/16 and discharged to home with plans for IV Vanc/Ceftriaxone to continue thorough 08/23/16. He was readmitted with 07/20/16 with increase in back pain with radiculopathy and decline in mobility.  Follow up MRI showed recurrence of abscess and progressive  Discitis and osteomyelitis with destruction of L4 and L5 vertebral body. Dr. Rolena Infante consulted for second opinion and concurred with recommendations of surgical intervention. Patient underwent  revision of L4/5 decompression with  I and D of infected material and  posterior fusion L3-S1. He has received  2 units PRBC for ABLA and pain control/muscle spasms are improving. Dr. Linus Salmons recommends continuing vancomycin/ceftazidime for 8 weeks at least and follow up with [facility] in 4 weeks.  TLSO with left leg extension ordered for support and therapy revealing decline in mobility as well as ability to carry out self care tasks. CIR recommended for follow up therapy.    Review of Systems  HENT: Negative for ear pain, hearing loss and tinnitus.   Eyes: Negative for blurred vision and double vision.  Respiratory: Negative for cough and shortness of breath.   Cardiovascular: Negative for chest pain and palpitations.  Gastrointestinal: Negative for constipation, heartburn, nausea and vomiting.       Lack of appetite  Genitourinary: Negative for dysuria and urgency.  Musculoskeletal: Positive for back pain and myalgias.  Skin: Negative for itching and rash.  Neurological: Positive for tingling, focal weakness (progressive left foot weakness for  years) and weakness. Negative for dizziness and headaches.  Psychiatric/Behavioral: Negative for depression and memory loss. The patient is nervous/anxious and has insomnia.   All other systems reviewed and are negative.    Past Medical History:  Diagnosis Date  . Adenomatous polyps 10/2002, 06/2009  . Arthritis   . BPH (benign prostatic hyperplasia)   . Hyperlipemia   . Prostate cancer ([place])   . Spinal stenosis   . Wears glasses     Past Surgical History:  Procedure Laterality Date  . ANTERIOR CERVICAL DECOMP/DISCECTOMY FUSION N/A 09/18/2013   Procedure: Anterior cervical decompression fusion, cervical 4-5, cervical 5-6, cervical 6-7 with instrumentation and allograft;  Surgeon: Sinclair Ship, MD;  Location: [place];  Service: Orthopedics;  Laterality: N/A;  Anterior cervical decompression fusion, cervical 4-5, cervical 5-6, cervical 6-7 with instrumentation and allograft  . COLONOSCOPY    . COLONOSCOPY W/ POLYPECTOMY  11/04/2002   Sigmoid adenomas x2, 1 cm maximum  . COLONOSCOPY W/ POLYPECTOMY  06/08/2009   6 polyps, largest 8 mm, at least 3 adenomas  . CYSTOSCOPY  04/2010  . ESOPHAGOGASTRODUODENOSCOPY  11/04/2002   Duodenitis, gastritis without H. pylori   . HIP ARTHROPLASTY  04/2010   Left - Dr. Mayer Camel, right 2012  . INCISION AND DRAINAGE OF WOUND N/A 07/25/2016   Procedure: IRRIGATION AND DEBRIDEMENT LUMBAR DISCITIS;  Surgeon: Phylliss Bob, MD;  Location: [place];  Service: Orthopedics;  Laterality: N/A;  . JOINT REPLACEMENT    . LUMBAR LAMINECTOMY/DECOMPRESSION MICRODISCECTOMY N/A 03/30/2016   Procedure: LUMBAR 3-4, LUMBAR 4-5 DECOMPRESSION ;  Surgeon: Phylliss Bob, MD;  Location: [place];  Service: Orthopedics;  Laterality: N/A;  LUMBAR 3-4, LUMBAR 4-5 DECOMPRESSION   . LUMBAR WOUND DEBRIDEMENT N/A 06/29/2016   Procedure: LUMBAR WOUND EXPLORATION AND IRRIGATION AND  DEBRIDEMENT;  Surgeon: Phylliss Bob, MD;  Location: [place];  Service: Orthopedics;  Laterality: N/A;  . PROSTATE  SURGERY    . RETROPUBIC PROSTATECTOMY  09/2003  . TONSILLECTOMY    . UPPER GASTROINTESTINAL ENDOSCOPY      Family History  Problem Relation Age of Onset  . Diabetes Mother   . Heart disease Mother   . Prostate cancer Father   . Cancer Brother     prostate  . Colon cancer Neg Hx   . Stomach cancer Neg Hx     Social History:  Married. Retired Therapist, sports. He  reports that he quit smoking about 43 years ago. His smoking use included Cigars. He has never used smokeless tobacco. He reports that he has not had any alcohol since July 2017.  He reports that he does not use drugs.     Allergies  Allergen Reactions  . Iodine Anaphylaxis  . Ivp Dye [Iodinated Diagnostic Agents] Anaphylaxis    Medications Prior to Admission  Medication Sig Dispense Refill  . cefTRIAXone 2 g in dextrose 5 % 50 mL Inject 2 g into the vein daily. End date 08/23/2016 112 g 0  . clotrimazole (LOTRIMIN) 1 % cream Apply 1 application topically 2 (two) times daily. 30 g 0  . docusate sodium (COLACE) 100 MG capsule Take 100 mg by mouth daily.    Marland Kitchen HYDROcodone-acetaminophen (NORCO) 5-325 MG tablet Take 1-2 tablets by mouth every 6 (six) hours as needed for moderate pain. 40 tablet 0  . methocarbamol (ROBAXIN) 500 MG tablet Take 500 mg by mouth 3 (three) times daily.     . Multiple Vitamin (MULTIVITAMIN) capsule Take 1 capsule by mouth daily.      Marland Kitchen oxyCODONE-acetaminophen (PERCOCET/ROXICET) 5-325 MG tablet Take 1-2 tablets by mouth every 6 (six) hours as needed for severe pain.    Marland Kitchen terbinafine (LAMISIL) 250 MG tablet Take 1 tablet (250 mg total) by mouth daily. 7 tablet 0  . vancomycin (VANCOCIN) 1-5 GM/200ML-% SOLN Inject 200 mLs (1,000 mg total) into the vein every 12 (twelve) hours. Up until 08/23/2016 22400 mL 0  . zolpidem (AMBIEN) 10 MG tablet TAKE 1/2 TABLET AT BEDTIME AS NEEDED FOR SLEEP. 45 tablet 0    Home: Home Living Family/patient expects to be discharged to:: Private residence Living  Arrangements: Spouse/significant other Available Help at Discharge: Family, Available 24 hours/day Type of Home: House Home Access: Stairs to enter [employer] of Steps: 3 Entrance Stairs-Rails: Left Home Layout: One level Bathroom Shower/Tub: Multimedia programmer: Standard Bathroom Accessibility: Yes Home Equipment: Environmental consultant - 2 wheels, Cane - single point, Crutches, Bedside commode, Shower seat, Financial controller: Reacher, Sock aid, Long-handled shoe horn   Functional History: Prior Function Level of Independence: Needs assistance Gait / Transfers Assistance Needed: Using RW ADL's / Homemaking Assistance Needed: Needs min assist at times for LB dressing Comments: using rw PTA  Functional Status:  Mobility: Bed Mobility Overal bed mobility: Needs Assistance, +2 for physical assistance Bed Mobility: Rolling, Sidelying to Sit Rolling: Mod assist, +2 for safety/equipment Sidelying to sit: Mod assist, +2 for physical assistance, HOB elevated Sit to sidelying: Mod assist, +2 for physical assistance, HOB elevated General bed mobility comments: Pt OOB in chair upon arrival Transfers Overall transfer level: Needs assistance Equipment used: Rolling walker (2 wheeled) Transfers:  Sit to/from Stand Sit to Stand: Min assist Stand pivot transfers: Mod assist, +2 physical assistance General transfer comment: cues for hand placement and technique Ambulation/Gait Ambulation/Gait assistance: Min assist Ambulation Distance (Feet): 120 Feet Assistive device: Rolling walker (2 wheeled) Gait Pattern/deviations: Step-through pattern, Decreased stride length General Gait Details: multimodal cues for bilat knee extension during stance phase and for posture; pt unsteady due to weak bilat LE and maintaining bilat flexed knees  Gait velocity: decreased Gait velocity interpretation: Below normal speed for age/gender    ADL: ADL Overall ADL's : Needs  assistance/impaired Grooming: Set up, Bed level, Oral care Grooming Details (indicate cue type and reason): Poor balance Upper Body Bathing: Moderate assistance (simulated) Upper Body Bathing Details (indicate cue type and reason): Poor balance, TLSO Lower Body Bathing: Total assistance Upper Body Dressing : Moderate assistance, Sitting Upper Body Dressing Details (indicate cue type and reason): Poor balance, TLSO Lower Body Dressing: Total assistance Toilet Transfer: Moderate assistance, +2 for physical assistance, +2 for safety/equipment Toileting- Clothing Manipulation and Hygiene: Total assistance, Sit to/from stand Functional mobility during ADLs: Moderate assistance, +2 for physical assistance, +2 for safety/equipment, Rolling walker General ADL Comments: Pt has shower chair, reacher, sock aid and LH shoe horn at home  Cognition: Cognition Overall Cognitive Status: Within Functional Limits for tasks assessed Orientation Level: Oriented X4 Cognition Arousal/Alertness: Awake/alert Behavior During Therapy: WFL for tasks assessed/performed Overall Cognitive Status: Within Functional Limits for tasks assessed   Blood pressure 126/74, pulse 64, temperature 98.1 F (36.7 C), temperature source Oral, resp. rate 16, height 6' (1.829 m), weight 88 kg (193 lb 14.4 oz), SpO2 97 %. Physical Exam  Nursing note and vitals reviewed. Constitutional: He is oriented to person, place, and time. He appears well-developed and well-nourished. No distress.  HENT:  Head: Normocephalic and atraumatic.  Eyes: Conjunctivae and EOM are normal. Pupils are equal, round, and reactive to light.  Neck: Normal range of motion. Neck supple.  Cardiovascular: Normal rate and regular rhythm.   Respiratory: Effort normal and breath sounds normal. No respiratory distress. He has no wheezes.  GI: Soft. Bowel sounds are normal. He exhibits no distension. There is no tenderness.  Musculoskeletal: He exhibits edema  (minimal edema left ankle). He exhibits no tenderness.  Neurological: He is alert and oriented to person, place, and time.  Motor: 4+/5 throughout, UE stronger than LE Sensation intact to light touch throughout  Skin: Skin is warm and dry. He is not diaphoretic. No erythema.  Psychiatric: He has a normal mood and affect. His behavior is normal. Judgment and thought content normal.    Results for orders placed or performed during the hospital encounter of 07/20/16 (from the past 48 hour(s))  Basic metabolic panel     Status: Abnormal   Collection Time: 07/31/16  5:15 AM  Result Value Ref Range   Sodium 142 135 - 145 mmol/L   Potassium 3.5 3.5 - 5.1 mmol/L   Chloride 110 101 - 111 mmol/L   CO2 26 22 - 32 mmol/L   Glucose, Bld 94 65 - 99 mg/dL   BUN 8 6 - 20 mg/dL   Creatinine, Ser 0.92 0.61 - 1.24 mg/dL   Calcium 8.2 (L) 8.9 - 10.3 mg/dL   GFR calc non Af Amer >60 >60 mL/min   GFR calc Af Amer >60 >60 mL/min    Comment: (NOTE) The eGFR has been calculated using the CKD EPI equation. This calculation has not been validated in all clinical situations. eGFR's persistently <60  mL/min signify possible Chronic Kidney Disease.    Anion gap 6 5 - 15   No results found.     Medical Problem List and Plan: 1.  Weakness, gait abnormality, balance deficits secondary to Lumbar discitis with radiculopathy. 2.  DVT Prophylaxis/Anticoagulation: Mechanical: Sequential compression devices, below knee Bilateral lower extremities 3. Pain Management: Improved on OxyContin 20 mg bid and prn oxycodone.  4. Mood: Team to provide ego support to help with adjustment reaction. LCSW to follow for evaluation and support.  5. Neuropsych: This patient is capable of making decisions on his own behalf. 6. Skin/Wound Care: Monitor wound daily for healing. Maintain adequate nutritional and hydration status.  7. Fluids/Electrolytes/Nutrition: Monitor I/O. Check lytes in am.  8. Diskitis with osteomyelitis:  Vancomycin/Ceftazidime D # 6/ 56+  Monitor levels 9.  BPH: Voiding without difficulty.  10. ABLA: Monitor with routine check for stability. Continue iron for supplementation. 11. Constipation: Managed with senna and miralax on board.    Post Admission Physician Evaluation: 1. Preadmission assessment reviewed and changes made below. 2. Functional deficits secondary  to Lumbar discitis with radiculopathy. 3. Patient is admitted to receive collaborative, interdisciplinary care between the physiatrist, rehab nursing staff, and therapy team. 4. Patient's level of medical complexity and substantial therapy needs in context of that medical necessity cannot be provided at a lesser intensity of care such as a SNF. 5. Patient has experienced substantial functional loss from his/her baseline which was documented above under the "Functional History" and "Functional Status" headings.  Judging by the patient's diagnosis, physical exam, and functional history, the patient has potential for functional progress which will result in measurable gains while on inpatient rehab.  These gains will be of substantial and practical use upon discharge  in facilitating mobility and self-care at the household level. 6. Physiatrist will provide 24 hour management of medical needs as well as oversight of the therapy plan/treatment and provide guidance as appropriate regarding the interaction of the two. 7. The Preadmission Screening has been reviewed and patient status is unchanged unless otherwise stated above. 8. 24 hour rehab nursing will assist with bladder management, bowel management, safety, skin/wound care, disease management, pain management and patient education  and help integrate therapy concepts, techniques,education, etc. 9. PT will assess and treat for/with: Lower extremity strength, range of motion, stamina, balance, functional mobility, safety, adaptive techniques and equipment, woundcare, coping skills, pain  control, education.   Goals are: Mod I. 10. OT will assess and treat for/with: ADL's, functional mobility, safety, upper extremity strength, adaptive techniques and equipment, wound mgt, ego support, and community reintegration.   Goals are: Mod I. Therapy may not proceed with showering this patient. 11. Case Management and Social Worker will assess and treat for psychological issues and discharge planning. 12. Team conference will be held weekly to assess progress toward goals and to determine barriers to discharge. 13. Patient will receive at least 3 hours of therapy per day at least 5 days per week. 14. ELOS: 7-10 days.        15. Prognosis:  excellent  Delice Lesch, MD, Mellody Drown 07/31/2016

## 2016-07-31 NOTE — Progress Notes (Signed)
Pt discharge to CIR and report given to [facility]. Pt transported off unit to CIR via wheelchair with belongings an family to the side. Delia Heady RN

## 2016-07-31 NOTE — Progress Notes (Signed)
I received insurance authorization from pt's [employer] [facility] for an IP Rehab admission.  I received medical clearance from Dr. Lonny Prude for admission today.  Pt. Is in full agreement.  I updated pt's RN as welll as RNCM.  Please call if questions.  [facility] Admissions Coordinator Cell [phone] Office [phone]

## 2016-07-31 NOTE — Progress Notes (Signed)
[facility] hospital team will continue to follow Mr. Nestle while inpatient at [facility] to support home care needs at DC back to home.  If patient discharges after hours, please call [phone].   Larry Sierras 07/31/2016, 4:32 PM

## 2016-07-31 NOTE — Progress Notes (Signed)
Occupational Therapy Treatment Patient Details Name: Donald [name] MRN: [id number] DOB: 1941/06/19 Today's Date: 07/31/2016    History of present illness 75 year old Caucasian male past history significant for prostate cancer status post prostatectomy, and spinal stenosis status post L3/L4 and L4/L5 decompression on 720/2017 by [name]. 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 He was placed on vancomycin and ceftriaxone to complete an 8 week course of home antibiotics via  RUE PICC line, presented with worsening back pain. Imaging studies showed osteomyelitis of L4-L5 and epidural abscess.  I &D and fusion L3-S1.   OT comments  Pt progressing well toward OT goals. Pt able to complete grooming tasks at sink for approximately 7 minutes this session with single UE support and mod assist at times to maintain balance. Pt able to complete simulated toilet transfer with min assist for functional mobility. Pt continues to demonstrate decreased activity tolerance for ADL and decreased ADL independence, but demonstrates steady improvement. Pt plans to D/C to CIR for continued rehabilitation services this afternoon. D/C plan remains appropriate. OT will continue to follow acutely.  Follow Up Recommendations  CIR    Equipment Recommendations  None recommended by OT    Recommendations for Other Services      Precautions / Restrictions Precautions Precautions: Back Precaution Booklet Issued: No Precaution Comments: Reviewed back precautions during tx. Required Braces or Orthoses: Spinal Brace Spinal Brace: Other (comment);Thoracolumbosacral orthotic (with left leg extension) Restrictions Weight Bearing Restrictions: No       Mobility Bed Mobility               General bed mobility comments: Received in recliner  Transfers Overall transfer level: Needs assistance Equipment used: Rolling walker (2  wheeled) Transfers: Sit to/from Stand Sit to Stand: Min assist         General transfer comment: cues for hand placement and technique    Balance   Sitting-balance support: No upper extremity supported;Feet supported Sitting balance-Leahy Scale: Fair Sitting balance - Comments: trunk supported and trunk unsupported in chair   Standing balance support: Bilateral upper extremity supported;Single extremity supported;During functional activity Standing balance-Leahy Scale: Poor Standing balance comment: Able to stand at sink for grooming with min assist with single UE support and briefly (less than 30 seconds) with mod assist with no UE support.                   ADL Overall ADL's : Needs assistance/impaired     Grooming: Wash/dry face;Minimal assistance;Moderate assistance;Standing Grooming Details (indicate cue type and reason): Poor balance and requiring min assist for majority but mod assist at times to maintain balance while shaving at sink.                             Functional mobility during ADLs: Minimal assistance;Rolling walker General ADL Comments: Pt educated on rehabilitation expectations and safety during ADL. Pt's son and wife present during session.      Vision                     Perception     Praxis      Cognition   Behavior During Therapy: [facility] for tasks assessed/performed Overall Cognitive Status: Within Functional Limits for tasks assessed                       Extremity/Trunk Assessment  Exercises     Shoulder Instructions       General Comments      Pertinent Vitals/ Pain       Pain Assessment: Faces Faces Pain Scale: Hurts a little bit Pain Location: back Pain Descriptors / Indicators: Sore Pain Intervention(s): Limited activity within patient's tolerance;Monitored during session;Repositioned  Home Living                                          Prior  Functioning/Environment              Frequency  Min 2X/week        Progress Toward Goals  OT Goals(current goals can now be found in the care plan section)  Progress towards OT goals: Progressing toward goals  Acute Rehab OT Goals Patient Stated Goal: go to rehab OT Goal Formulation: With patient/family Time For Goal Achievement: 08/03/16 Potential to Achieve Goals: Good ADL Goals Pt Will Perform Grooming: with min guard assist;with supervision;sitting Pt Will Perform Upper Body Bathing: with min guard assist;sitting Pt Will Perform Lower Body Bathing: with adaptive equipment;with max assist;with mod assist;sitting/lateral leans Pt Will Perform Upper Body Dressing: with min guard assist;sitting Pt Will Perform Lower Body Dressing: with supervision;with adaptive equipment;sit to/from stand Pt Will Transfer to Toilet: with mod assist;with min assist;with +2 assist;bedside commode;ambulating Pt Will Perform Toileting - Clothing Manipulation and hygiene: with mod assist;sit to/from stand;with caregiver independent in assisting Additional ADL Goal #1: Pt/family education for donning/doffing TLSO brace  Plan Discharge plan remains appropriate    Co-evaluation                 End of Session Equipment Utilized During Treatment: Rolling walker;Back brace;Other (comment) (TLSO with L LE extension)   Activity Tolerance Patient tolerated treatment well   Patient Left with call bell/phone within reach;with family/visitor present;in chair   Nurse Communication          Time: [id number] OT Time Calculation (min): 18 min  Charges: OT General Charges $OT Visit: 1 Procedure OT Treatments $Self Care/Home Management : 8-22 mins  Norman Herrlich, OTR/L [phone] 07/31/2016, 4:28 PM

## 2016-07-31 NOTE — Progress Notes (Signed)
Physical Therapy Treatment Patient Details Name: Donald [name] MRN: [id number] DOB: June 17, 1941 Today's Date: 07/31/2016    History of Present Illness 75 year old Caucasian male past history significant for prostate cancer status post prostatectomy, and spinal stenosis status post L3/L4 and L4/L5 decompression on 720/2017 by [name]. 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 He was placed on vancomycin and ceftriaxone to complete an 8 week course of home antibiotics via  RUE PICC line, presented with worsening back pain. Imaging studies showed osteomyelitis of L4-L5 and epidural abscess.  I &D and fusion L3-S1.    PT Comments    Patient continues to progress toward mobility goals. Current plan remains appropriate.   Follow Up Recommendations  CIR;Supervision/Assistance - 24 hour     Equipment Recommendations  Rolling walker with 5" wheels;3in1 (PT) (tbd next setting of care)    Recommendations for Other Services       Precautions / Restrictions Precautions Precautions: Back Precaution Comments: Reviewed back precautions during tx. Required Braces or Orthoses: Spinal Brace Spinal Brace: Thoracolumbosacral orthotic (with left leg extension)    Mobility  Bed Mobility               General bed mobility comments: Pt OOB in chair upon arrival  Transfers Overall transfer level: Needs assistance Equipment used: Rolling walker (2 wheeled) Transfers: Sit to/from Stand Sit to Stand: Min assist         General transfer comment: cues for hand placement and technique  Ambulation/Gait Ambulation/Gait assistance: Min assist Ambulation Distance (Feet): 120 Feet Assistive device: Rolling walker (2 wheeled) Gait Pattern/deviations: Step-through pattern;Decreased stride length     General Gait Details: multimodal cues for bilat knee extension during stance phase and for posture; pt unsteady  due to weak bilat LE and maintaining bilat flexed knees    Stairs            Wheelchair Mobility    Modified Rankin (Stroke Patients Only)       Balance     Sitting balance-Leahy Scale: Fair Sitting balance - Comments: trunk supported and trunk unsupported in chair     Standing balance-Leahy Scale: Poor                      Cognition Arousal/Alertness: Awake/alert Behavior During Therapy: WFL for tasks assessed/performed Overall Cognitive Status: Within Functional Limits for tasks assessed                      Exercises      General Comments General comments (skin integrity, edema, etc.): educated pt on positioning/adjusting brace and assisted with adjustments in standing befor ambulation      Pertinent Vitals/Pain Pain Assessment: Faces Faces Pain Scale: Hurts a little bit Pain Location: back Pain Descriptors / Indicators: Sore Pain Intervention(s): Limited activity within patient's tolerance;Monitored during session;Premedicated before session;Repositioned    Home Living                      Prior Function            PT Goals (current goals can now be found in the care plan section) Acute Rehab PT Goals Patient Stated Goal: go to rehab Progress towards PT goals: Progressing toward goals    Frequency    Min 5X/week      PT Plan Current plan remains appropriate    Co-evaluation  End of Session Equipment Utilized During Treatment: Gait belt;Back brace Activity Tolerance: Patient tolerated treatment well;Patient limited by fatigue Patient left: in chair;with call bell/phone within reach;with family/visitor present     Time: [id number] PT Time Calculation (min) (ACUTE ONLY): 25 min  Charges:  $Gait Training: 8-22 mins $Therapeutic Activity: 8-22 mins                    G Codes:      Salina April, PTA Pager: [phone]   07/31/2016, 1:29 PM

## 2016-08-01 ENCOUNTER — Inpatient Hospital Stay (HOSPITAL_COMMUNITY): Payer: Medicare Other | Admitting: Physical Therapy

## 2016-08-01 ENCOUNTER — Inpatient Hospital Stay (HOSPITAL_COMMUNITY): Payer: Medicare Other | Admitting: Occupational Therapy

## 2016-08-01 ENCOUNTER — Encounter (HOSPITAL_COMMUNITY): Payer: Self-pay | Admitting: Neurology

## 2016-08-01 DIAGNOSIS — M4646 Discitis, unspecified, lumbar region: Secondary | ICD-10-CM

## 2016-08-01 DIAGNOSIS — G061 Intraspinal abscess and granuloma: Secondary | ICD-10-CM

## 2016-08-01 DIAGNOSIS — M5416 Radiculopathy, lumbar region: Secondary | ICD-10-CM

## 2016-08-01 DIAGNOSIS — Z9889 Other specified postprocedural states: Secondary | ICD-10-CM

## 2016-08-01 DIAGNOSIS — G8220 Paraplegia, unspecified: Secondary | ICD-10-CM

## 2016-08-01 DIAGNOSIS — D62 Acute posthemorrhagic anemia: Secondary | ICD-10-CM

## 2016-08-01 LAB — COMPREHENSIVE METABOLIC PANEL
ALK PHOS: 62 U/L (ref 38–126)
ALT: 26 U/L (ref 17–63)
ANION GAP: 8 (ref 5–15)
AST: 25 U/L (ref 15–41)
Albumin: 2.1 g/dL — ABNORMAL LOW (ref 3.5–5.0)
BUN: 8 mg/dL (ref 6–20)
CALCIUM: 7.9 mg/dL — AB (ref 8.9–10.3)
CO2: 24 mmol/L (ref 22–32)
CREATININE: 0.87 mg/dL (ref 0.61–1.24)
Chloride: 108 mmol/L (ref 101–111)
Glucose, Bld: 105 mg/dL — ABNORMAL HIGH (ref 65–99)
Potassium: 3.3 mmol/L — ABNORMAL LOW (ref 3.5–5.1)
SODIUM: 140 mmol/L (ref 135–145)
Total Bilirubin: 0.4 mg/dL (ref 0.3–1.2)
Total Protein: 4.4 g/dL — ABNORMAL LOW (ref 6.5–8.1)

## 2016-08-01 LAB — CBC WITH DIFFERENTIAL/PLATELET
Basophils Absolute: 0 10*3/uL (ref 0.0–0.1)
Basophils Relative: 0 %
EOS ABS: 0.8 10*3/uL — AB (ref 0.0–0.7)
EOS PCT: 15 %
HCT: 26.2 % — ABNORMAL LOW (ref 39.0–52.0)
HEMOGLOBIN: 8.2 g/dL — AB (ref 13.0–17.0)
LYMPHS ABS: 1.1 10*3/uL (ref 0.7–4.0)
LYMPHS PCT: 22 %
MCH: 25.1 pg — AB (ref 26.0–34.0)
MCHC: 31.3 g/dL (ref 30.0–36.0)
MCV: 80.1 fL (ref 78.0–100.0)
MONOS PCT: 11 %
Monocytes Absolute: 0.5 10*3/uL (ref 0.1–1.0)
Neutro Abs: 2.5 10*3/uL (ref 1.7–7.7)
Neutrophils Relative %: 52 %
PLATELETS: 204 10*3/uL (ref 150–400)
RBC: 3.27 MIL/uL — ABNORMAL LOW (ref 4.22–5.81)
RDW: 17.3 % — ABNORMAL HIGH (ref 11.5–15.5)
WBC: 4.9 10*3/uL (ref 4.0–10.5)

## 2016-08-01 MED ORDER — POTASSIUM CHLORIDE CRYS ER 20 MEQ PO TBCR
20.0000 meq | EXTENDED_RELEASE_TABLET | Freq: Every day | ORAL | Status: DC
  Administered 2016-08-01 – 2016-08-08 (×8): 20 meq via ORAL
  Filled 2016-08-01 (×8): qty 1

## 2016-08-01 MED ORDER — ENSURE ENLIVE PO LIQD
237.0000 mL | Freq: Two times a day (BID) | ORAL | Status: DC
  Administered 2016-08-01 – 2016-08-08 (×11): 237 mL via ORAL

## 2016-08-01 NOTE — Progress Notes (Signed)
Initial Nutrition Assessment  DOCUMENTATION CODES:   Severe malnutrition in context of acute illness/injury  INTERVENTION:  Provide Ensure Enlive po BID, each supplement provides 350 kcal and 20 grams of protein   NUTRITION DIAGNOSIS:   Malnutrition related to acute illness, poor appetite as evidenced by energy intake < or equal to 50% for > or equal to 5 days, moderate depletions of muscle mass, percent weight loss.   GOAL:   Patient will meet greater than or equal to 90% of their needs   MONITOR:   PO intake, Supplement acceptance, Labs, Weight trends, Skin, I & O's  REASON FOR ASSESSMENT:   Malnutrition Screening Tool    ASSESSMENT:   75 y.o. male with history of prostate cancer, lumbar stenosis s/p L3-L5 decompression 03/30/16 who started developing BLE pain with spasms and was found to have L4-L5 osteomyelitis with epidural abscess. He was admitted for I & D of lumbar wound on 06/29/16 and discharged to home with plans for IV Vanc/Ceftriaxone to continue thorough 08/23/16. He was readmitted with 07/20/16 with increase in back pain with radiculopathy and decline in mobility.  Follow up MRI showed recurrence of abscess and progressive  Discitis and osteomyelitis with destruction of L4 and L5 vertebral body. Dr. Rolena Infante consulted for second opinion and concurred with recommendations of surgical intervention. Patient underwent  revision of L4/5 decompression with  I and D of infected material   Pt states that prior to his surgery he had a very poor appetite for a few weeks and was eating less than 50% compared to usual. Weight history shows weight loss from 222 lbs to 194 lbs in the past 6-7 weeks; 13% weight loss. Pt states that he used to weigh 265 lbs and this is the first time he has weighed under 200 lbs since high school. He states that his appetite has gradually improved since admission with intake of 50-100% of meals the past 3 days. He is agreeable to drinking Ensure due to  severe weight loss and muscle wasting .  Labs: low potassium, low calcium, low albumin, low hemoglobin Diet Order:  Diet regular Room service appropriate? Yes; Fluid consistency: Thin  Skin:  Reviewed, no issues (closed incision on back)  Last BM:  11/21  Height:   Ht Readings from Last 1 Encounters:  07/31/16 6' (1.829 m)    Weight:   Wt Readings from Last 1 Encounters:  08/01/16 194 lb 0.1 oz (88 kg)    Ideal Body Weight:  80.9 kg  BMI:  Body mass index is 26.31 kg/m.  Estimated Nutritional Needs:   Kcal:  2200-2400  Protein:  105-115 grams  Fluid:  2.2-2.4 L/day  EDUCATION NEEDS:   No education needs identified at this time  [place], CSP, LDN Inpatient Clinical Dietitian Pager: [phone] After Hours Pager: [phone]

## 2016-08-01 NOTE — Progress Notes (Signed)
Patient information reviewed and entered into eRehab system by Jesika Men, RN, CRRN, PPS Coordinator.  Information including medical coding and functional independence measure will be reviewed and updated through discharge.     Per nursing patient was given "Data Collection Information Summary for Patients in Inpatient Rehabilitation Facilities with attached "Privacy Act Statement-Health Care Records" upon admission.  

## 2016-08-01 NOTE — Progress Notes (Signed)
Physical Therapy Session Note  Patient Details  Name: Donald [name] MRN: [id number] Date of Birth: 01-11-1941  Today's Date: 08/01/2016 PT Individual Time: 1052-1151 PT Individual Time Calculation (min): 59 min     Therapy Documentation Precautions:  Restrictions Weight Bearing Restrictions: No TLSO with leg extension at all times and Must be applied supine in bed   Pain: Pain Assessment Pain Assessment: 0-10 Pain Score: 4   Sit to and from stand transfer with RW close supervision  Patient ambulated 225 feetx3 with RW close supervision with incidental min assist secondary to right knee buckling. Patient ambulated with a step through gait pattern. Verbal cues for upright posture and positioning in RW.  Patient ambulated 12 feet over uneven surface with RW min assist with one episode of mod assist secondary to LOB posteriorly.  Patient ambulated with RW through obstacle course for simulation of negotiating tight spaces.   Patient up and down 6 inch curb with RW mod assist to ascend and descend curb step.   Standing there ex: Mini Squats 3x10 with use of RW min assist.   Patient returned to room at end of session with son present. Discussed rehab facility, goals and scheduling with patient and son. Verbalized understanding. Patient remained seated in recliner chair with all needs met. PAtient educated not to be up without assistance from staff. Patient and son in agreement and verbalized understanding.   See Function Navigator for Current Functional Status.   Therapy/Group: Individual Therapy  Retta Diones 08/01/2016, 12:02 PM

## 2016-08-01 NOTE — Progress Notes (Signed)
[facility]  Patient Status: Active (receiving services up to time of hospitalization)  [facility] is providing the following services: RN, PT and OT  If patient discharges after hours, please call [phone].   Donald [name] 08/01/2016, 12:48 PM

## 2016-08-01 NOTE — Progress Notes (Signed)
Charlett Blake, MD Physician Signed Physical Medicine and Rehabilitation  Consult Note Date of Service: 07/27/2016 3:48 PM  Related encounter: ED to Hosp-Admission (Discharged) from 07/20/2016 in [facility] All Collapse All   [] Hide copied text [] Hover for attribution information      Physical Medicine and Rehabilitation Consult   Reason for Consult: Lumbar discitis with radiculopathy.  Referring Physician: Dr. Teryl Lucy   HPI: Donald [name] is a 75 y.o. male with history of prostate cancer, lumbar stenosis s/p L3-L5 decompression 03/30/16 who started developing BLE pain with spasms and was found to have L4-L5 osteomyelitis with epidural abscess. He was admitted for I & D of lumbar wound on 06/29/16 and discharged to home with plans for IV Vanc/Ceftriaxone to continue thorough 08/23/16. He was readmitted with 07/20/16 with increase in back pain with radiculopathy and decline in mobility.  Follow up MRI showed recurrence of abscess and progressive discitis/osteomyelitis with destruction of L4 and L5 vertebral body. Dr. Rolena Infante consulted for second opinion and concurred with recommendations of surgical intervention. Patient underwent   Revision of L4/5 decompression with  I and D of infected material and  posterior fusion L3-S1.. Dr. Linus Salmons recommends continuing current antibiotics till cultures finalize. In process of receiving 2 units PRBC for ABLA. TLSO with left leg extension ordered for support and therapy evaluations completed today revealing decline in mobility as well as ability to carry out self care tasks. CIR recommended for follow up therapy.   Became dizzy when patient was up with PT today. Hemoglobin 7.5, now receiving transfusion  Review of Systems  HENT: Negative for hearing loss and tinnitus.   Eyes: Negative for blurred vision and double vision.  Respiratory: Negative for shortness of breath.   Cardiovascular:  Negative for chest pain and palpitations.  Gastrointestinal: Positive for constipation. Negative for heartburn and nausea.  Genitourinary: Negative for dysuria.  Musculoskeletal: Negative for back pain and myalgias.  Skin: Negative for itching and rash.  Neurological: Positive for sensory change (LLE > RLE), focal weakness (BLE weakness) and weakness. Negative for dizziness and headaches.  Psychiatric/Behavioral: Positive for depression. Negative for memory loss. The patient does not have insomnia.           Past Medical History:  Diagnosis Date  . Adenomatous polyps 10/2002, 06/2009  . Arthritis   . BPH (benign prostatic hyperplasia)   . Hyperlipemia   . Prostate cancer ([place])   . Spinal stenosis   . Wears glasses          Past Surgical History:  Procedure Laterality Date  . ANTERIOR CERVICAL DECOMP/DISCECTOMY FUSION N/A 09/18/2013   Procedure: Anterior cervical decompression fusion, cervical 4-5, cervical 5-6, cervical 6-7 with instrumentation and allograft;  Surgeon: Sinclair Ship, MD;  Location: [facility];  Service: Orthopedics;  Laterality: N/A;  Anterior cervical decompression fusion, cervical 4-5, cervical 5-6, cervical 6-7 with instrumentation and allograft  . COLONOSCOPY    . COLONOSCOPY W/ POLYPECTOMY  11/04/2002   Sigmoid adenomas x2, 1 cm maximum  . COLONOSCOPY W/ POLYPECTOMY  06/08/2009   6 polyps, largest 8 mm, at least 3 adenomas  . CYSTOSCOPY  04/2010  . ESOPHAGOGASTRODUODENOSCOPY  11/04/2002   Duodenitis, gastritis without H. pylori   . HIP ARTHROPLASTY  04/2010   Left - Dr. Mayer Camel, right 2012  . JOINT REPLACEMENT    . LUMBAR LAMINECTOMY/DECOMPRESSION MICRODISCECTOMY N/A 03/30/2016   Procedure: LUMBAR 3-4, LUMBAR 4-5 DECOMPRESSION ;  Surgeon: Elta Guadeloupe  Dumonski, MD;  Location: [facility];  Service: Orthopedics;  Laterality: N/A;  LUMBAR 3-4, LUMBAR 4-5 DECOMPRESSION   . LUMBAR WOUND DEBRIDEMENT N/A 06/29/2016   Procedure: LUMBAR WOUND EXPLORATION  AND IRRIGATION AND  DEBRIDEMENT;  Surgeon: Phylliss Bob, MD;  Location: [facility];  Service: Orthopedics;  Laterality: N/A;  . PROSTATE SURGERY    . RETROPUBIC PROSTATECTOMY  09/2003  . TONSILLECTOMY    . UPPER GASTROINTESTINAL ENDOSCOPY            Family History  Problem Relation Age of Onset  . Diabetes Mother   . Heart disease Mother   . Prostate cancer Father   . Cancer Brother     prostate  . Colon cancer Neg Hx   . Stomach cancer Neg Hx     Social History:  Married. Retired Therapist, sports. He  reports that he quit smoking about 43 years ago. His smoking use included Cigars. He has never used smokeless tobacco. He reports that he has not had any alcohol since July 2017.  He reports that he does not use drugs.        Allergies  Allergen Reactions  . Iodine Anaphylaxis  . Ivp Dye [Iodinated Diagnostic Agents] Anaphylaxis          Medications Prior to Admission  Medication Sig Dispense Refill  . cefTRIAXone 2 g in dextrose 5 % 50 mL Inject 2 g into the vein daily. End date 08/23/2016 112 g 0  . clotrimazole (LOTRIMIN) 1 % cream Apply 1 application topically 2 (two) times daily. 30 g 0  . docusate sodium (COLACE) 100 MG capsule Take 100 mg by mouth daily.    Marland Kitchen HYDROcodone-acetaminophen (NORCO) 5-325 MG tablet Take 1-2 tablets by mouth every 6 (six) hours as needed for moderate pain. 40 tablet 0  . methocarbamol (ROBAXIN) 500 MG tablet Take 500 mg by mouth 3 (three) times daily.     . Multiple Vitamin (MULTIVITAMIN) capsule Take 1 capsule by mouth daily.      Marland Kitchen oxyCODONE-acetaminophen (PERCOCET/ROXICET) 5-325 MG tablet Take 1-2 tablets by mouth every 6 (six) hours as needed for severe pain.    Marland Kitchen terbinafine (LAMISIL) 250 MG tablet Take 1 tablet (250 mg total) by mouth daily. 7 tablet 0  . vancomycin (VANCOCIN) 1-5 GM/200ML-% SOLN Inject 200 mLs (1,000 mg total) into the vein every 12 (twelve) hours. Up until 08/23/2016 22400 mL 0  . zolpidem (AMBIEN)  10 MG tablet TAKE 1/2 TABLET AT BEDTIME AS NEEDED FOR SLEEP. 45 tablet 0    Home: Home Living Family/patient expects to be discharged to:: Private residence Living Arrangements: Spouse/significant other Available Help at Discharge: Family, Available 24 hours/day Type of Home: House Home Access: Stairs to enter [employer] of Steps: 3 Entrance Stairs-Rails: Left Home Layout: One level Bathroom Shower/Tub: Multimedia programmer: Standard Bathroom Accessibility: Yes Home Equipment: Environmental consultant - 2 wheels, Cane - single point, Crutches, Bedside commode, Shower seat, Financial controller: Reacher, Sock aid, Long-handled shoe horn  Functional History: Prior Function Level of Independence: Needs assistance Gait / Transfers Assistance Needed: Using RW ADL's / Homemaking Assistance Needed: Needs min assist at times for LB dressing Comments: using rw PTA Functional Status:  Mobility: Bed Mobility Overal bed mobility: Needs Assistance, +2 for physical assistance Bed Mobility: Rolling, Sidelying to Sit Rolling: Mod assist, Max assist, +2 for physical assistance Sidelying to sit: Mod assist, Max assist, +2 for physical assistance Sit to sidelying: Mod assist, Max assist, +2 for physical assistance  General bed mobility comments: Bio tech brought brace and assisted OT and PT with application of brace.  Difficult with placement as it was donned and doffed in supine.  Pt tolerated fairly well.  Will need further ed with pt and wife. Pt required mod to max assist for trunk control and assist with BLEs to transition into and out of bed.  Pt recalls back precautions and is doing well learing log roll.   Transfers Overall transfer level: Needs assistance Equipment used: Rolling walker (2 wheeled) Transfers: Sit to/from Stand Sit to Stand: Mod assist, +2 physical assistance, From elevated surface, Max assist Stand pivot transfers: Mod assist, +2 physical  assistance General transfer comment: Pt with posterior lean with difficulty with standing upright.  Pt states his LES feel like jello.  Bio tech stayed present and assisted with locking the left LE into extension.  Needed mod assist for static stance as pt with heavy left lateral lean. Ambulation/Gait Ambulation/Gait assistance: Mod assist, Max assist, +2 physical assistance Ambulation Distance (Feet): 20 Feet Assistive device: Standard walker Gait Pattern/deviations: Step-to pattern, Shuffle, Decreased stride length, Antalgic General Gait Details: Pt with heavy posterior and left lateral lean needing constant verbal and tactile cues as well as mod to max assist to ambulate around bed with PT and OT assisting with stablility, movment of walker and postural stability.  Gait velocity: slow pattern Gait velocity interpretation: Below normal speed for age/gender    ADL: ADL Overall ADL's : Needs assistance/impaired Grooming: Minimal assistance, Sitting Grooming Details (indicate cue type and reason): Poor balance Upper Body Bathing: Moderate assistance (simulated) Upper Body Bathing Details (indicate cue type and reason): Poor balance, TLSO Lower Body Bathing: Total assistance Upper Body Dressing : Moderate assistance, Sitting Upper Body Dressing Details (indicate cue type and reason): Poor balance, TLSO Lower Body Dressing: Total assistance Toilet Transfer: Moderate assistance, Maximal assistance, RW, +2 for safety/equipment, +2 for physical assistance (simulated) Toileting- Clothing Manipulation and Hygiene: Total assistance, Sit to/from stand Functional mobility during ADLs: Rolling walker, Moderate assistance, Maximal assistance, +2 for physical assistance, +2 for safety/equipment General ADL Comments: Pt has shower chair, reacher, sock aid and LH shoe horn at home  Cognition: Cognition Overall Cognitive Status: Within Functional Limits for tasks assessed Orientation Level: Oriented  X4 Cognition Arousal/Alertness: Awake/alert Behavior During Therapy: WFL for tasks assessed/performed Overall Cognitive Status: Within Functional Limits for tasks assessed   Blood pressure (!) 104/59, pulse 69, temperature 99.4 F (37.4 C), temperature source Oral, resp. rate 17, SpO2 96 %. Physical Exam  Nursing note and vitals reviewed. Constitutional: He is oriented to person, place, and time. He appears well-developed and well-nourished.  HENT:  Head: Normocephalic and atraumatic.  Eyes: Conjunctivae are normal. Pupils are equal, round, and reactive to light.  Neck: Normal range of motion. Neck supple.  Cardiovascular: Normal rate and regular rhythm.   Respiratory: Effort normal and breath sounds normal. No stridor. No respiratory distress. He has no wheezes.  GI: Soft. Bowel sounds are normal. He exhibits no distension. There is no tenderness.  Musculoskeletal: He exhibits no edema or tenderness.  Neurological: He is alert and oriented to person, place, and time. Coordination abnormal.  Skin: Skin is warm and dry.  Onychomycotic toe nails.   Psychiatric: He has a normal mood and affect. His behavior is normal. Thought content normal.  Motor strength is 5/5 bilateral deltoids, biceps, triceps, grip Sensation is normal in bilateral upper limbs. 3 minus bilateral hip flexor, 4 minus. Knee extensor, 3 minus. Left  uncal dorsiflexor, 4 minus. Right ankle dorsiflexor. Sensation normal in both lower limbs to light touch. There is normal tone in both lower limbs  Lab Results Last 24 Hours       Results for orders placed or performed during the hospital encounter of 07/20/16 (from the past 24 hour(s))  CBC     Status: Abnormal   Collection Time: 07/27/16  4:57 AM  Result Value Ref Range   WBC 9.5 4.0 - 10.5 K/uL   RBC 3.15 (L) 4.22 - 5.81 MIL/uL   Hemoglobin 7.5 (L) 13.0 - 17.0 g/dL   HCT 24.8 (L) 39.0 - 52.0 %   MCV 78.7 78.0 - 100.0 fL   MCH 23.8 (L) 26.0 - 34.0 pg    MCHC 30.2 30.0 - 36.0 g/dL   RDW 17.0 (H) 11.5 - 15.5 %   Platelets 255 150 - 400 K/uL  Prepare RBC     Status: None   Collection Time: 07/27/16  1:13 PM  Result Value Ref Range   Order Confirmation ORDER PROCESSED BY BLOOD BANK   Vancomycin, trough     Status: None   Collection Time: 07/27/16  1:58 PM  Result Value Ref Range   Vancomycin Tr 19 15 - 20 ug/mL      Imaging Results (Last 48 hours)  Dg Lumbar Spine 2-3 Views  Result Date: 07/25/2016 CLINICAL DATA:  Lumbar spinal fixation at L3-S1.  Initial encounter. EXAM: LUMBAR SPINE - 2-3 VIEW COMPARISON:  None. FINDINGS: Two fluoroscopic C-arm images are provided from the OR, demonstrating lumbar spinal fusion at L3-S1. Underlying decompression is noted. The patient is status post sacroiliac spinal fusion. IMPRESSION: Status post lumbar spinal fusion at L3-S1. Electronically Signed   By: Garald Balding M.D.   On: 07/25/2016 22:45   Dg C-arm Gt 120 Min  Result Date: 07/25/2016 CLINICAL DATA:  Lumbar spinal fixation at L3-S1.  Initial encounter. EXAM: LUMBAR SPINE - 2-3 VIEW COMPARISON:  None. FINDINGS: Two fluoroscopic C-arm images are provided from the OR, demonstrating lumbar spinal fusion at L3-S1. Underlying decompression is noted. The patient is status post sacroiliac spinal fusion. IMPRESSION: Status post lumbar spinal fusion at L3-S1. Electronically Signed   By: Garald Balding M.D.   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 Does the need for close, 24 hr/day medical supervision in concert with the patient's rehab needs make it unreasonable for this patient to be served in a less intensive setting? Yes 2. Co-Morbidities requiring supervision/potential complications: Acute blood loss anemia, urinary retention 3. Due to bladder management, bowel management, safety, skin/wound care, disease management, medication administration, pain management and  patient education, does the patient require 24 hr/day rehab nursing? Yes 4. Does the patient require coordinated care of a physician, rehab nurse, PT (1-2 hrs/day, 5 days/week) and OT (1-2 hrs/day, 5 days/week) to address physical and functional deficits in the context of the above medical diagnosis(es)? Yes Addressing deficits in the following areas: balance, endurance, locomotion, strength, transferring, bowel/bladder control, bathing, dressing, feeding, grooming, toileting and cognition 5. Can the patient actively participate in an intensive therapy program of at least 3 hrs of therapy per day at least 5 days per week? Yes 6. The potential for patient to make measurable gains while on inpatient rehab is excellent 7. Anticipated functional outcomes upon discharge from inpatient rehab are modified independent and supervision  with PT, modified independent and supervision with OT, n/a with SLP. 8. Estimated rehab  length of stay to reach the above functional goals is: 7-10d 9. Does the patient have adequate social supports and living environment to accommodate these discharge functional goals? Yes 10. Anticipated D/C setting: Home 11. Anticipated post D/C treatments: [place] therapy 12. Overall Rehab/Functional Prognosis: good  RECOMMENDATIONS: This patient's condition is appropriate for continued rehabilitative care in the following setting: CIR Patient has agreed to participate in recommended program. Yes Note that insurance prior authorization may be required for reimbursement for recommended care.  Comment:     07/27/2016    Revision History                        Routing History

## 2016-08-01 NOTE — Evaluation (Signed)
Physical Therapy Assessment and Plan  Patient Details  Name: Donald [name] MRN: [id number] Date of Birth: [birth year]  PT Diagnosis: Abnormal posture, Difficulty walking and Muscle weakness Rehab Potential: Good ELOS: 7 days   Today's Date: 08/01/2016 PT Individual Time: 830-940 PT Individual Time Calculation (min): 70 minutes     Problem List: Patient Active Problem List   Diagnosis Date Noted  . Lumbar radiculopathy 08/01/2016  . Paraparesis of both lower limbs ([place]) 07/31/2016  . Discitis   . History of lumbar surgery   . Surgery, elective   . Neurologic gait disorder   . Chronic pain syndrome   . Benign prostatic hyperplasia   . Acute blood loss anemia   . Constipation due to pain medication   . Paraparesis ([place])   . Microcytic anemia 07/21/2016  . Unintentional weight loss 07/21/2016  . Status post lumbar laminectomy 07/21/2016  . Spinal stenosis of lumbar region 07/21/2016  . Abscess in epidural space of lumbar spine   . Candidiasis   . Osteomyelitis of lumbar spine ([place]) 07/20/2016  . Propionibacterium infection   . 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 Discitis of lumbar region 06/29/2016  . Bilateral lower extremity pain   . Radicular pain 09/18/2013  . Hyperlipidemia 10/20/2011  . Insomnia 10/20/2011  . Prostate cancer ([place]) 10/20/2011  . Personal history of colonic polyps 12/16/2010    Past Medical History:  Past Medical History:  Diagnosis Date  . Adenomatous polyps 10/2002, 06/2009  . Arthritis   . BPH (benign prostatic hyperplasia)   . Hyperlipemia   . Prostate cancer ([place])   . Spinal stenosis   . Wears glasses    Past Surgical History:  Past Surgical History:  Procedure Laterality Date  . ANTERIOR CERVICAL DECOMP/DISCECTOMY FUSION N/A 09/18/2013   Procedure: Anterior cervical decompression fusion, cervical 4-5, cervical 5-6, cervical 6-7 with instrumentation and allograft;  Surgeon: Sinclair Ship, MD;  Location: [facility];  Service: Orthopedics;   Laterality: N/A;  Anterior cervical decompression fusion, cervical 4-5, cervical 5-6, cervical 6-7 with instrumentation and allograft  . COLONOSCOPY    . COLONOSCOPY W/ POLYPECTOMY  11/04/2002   Sigmoid adenomas x2, 1 cm maximum  . COLONOSCOPY W/ POLYPECTOMY  06/08/2009   6 polyps, largest 8 mm, at least 3 adenomas  . CYSTOSCOPY  04/2010  . ESOPHAGOGASTRODUODENOSCOPY  11/04/2002   Duodenitis, gastritis without H. pylori   . HIP ARTHROPLASTY  04/2010   Left - Dr. Mayer Camel, right 2012  . INCISION AND DRAINAGE OF WOUND N/A 07/25/2016   Procedure: IRRIGATION AND DEBRIDEMENT LUMBAR DISCITIS;  Surgeon: Phylliss Bob, MD;  Location: [facility];  Service: Orthopedics;  Laterality: N/A;  . JOINT REPLACEMENT    . LUMBAR LAMINECTOMY/DECOMPRESSION MICRODISCECTOMY N/A 03/30/2016   Procedure: LUMBAR 3-4, LUMBAR 4-5 DECOMPRESSION ;  Surgeon: Phylliss Bob, MD;  Location: [facility];  Service: Orthopedics;  Laterality: N/A;  LUMBAR 3-4, LUMBAR 4-5 DECOMPRESSION   . LUMBAR WOUND DEBRIDEMENT N/A 06/29/2016   Procedure: LUMBAR WOUND EXPLORATION AND IRRIGATION AND  DEBRIDEMENT;  Surgeon: Phylliss Bob, MD;  Location: [facility];  Service: Orthopedics;  Laterality: N/A;  . PROSTATE SURGERY    . RETROPUBIC PROSTATECTOMY  09/2003  . TONSILLECTOMY    . UPPER GASTROINTESTINAL ENDOSCOPY      Assessment & Plan Clinical Impression: Patient is a 75 y.o. year old male with recent admission to the hospital on 11/9/17with increase in back pain with radiculopathyand decline in mobility. Follow up MRI showed recurrence of abscess and progressive  Discitis  and osteomyelitis with destruction of L4 and L5 vertebral body. Dr. Rolena Infante consulted for second opinion and concurred with recommendations of surgical intervention. Patient underwent revision of L4/5 decompression with I and D of infected material and posterior fusion L3-S1.  Patient transferred to CIR on 07/31/2016 .   Patient currently requires min with mobility secondary to muscle  weakness and decreased standing balance and decreased balance strategies.  Prior to hospitalization, patient was independent  with mobility and lived with Spouse in a House home.  Home access is 3Stairs to enter.  Patient will benefit from skilled PT intervention to maximize safe functional mobility, minimize fall risk and decrease caregiver burden for planned discharge home with intermittent assist.  Anticipate patient will benefit from follow up OP at discharge.  PT - End of Session Activity Tolerance: Tolerates 30+ min activity with multiple rests Endurance Deficit: Yes PT Assessment Rehab Potential (ACUTE/IP ONLY): Good PT Patient demonstrates impairments in the following area(s): Balance;Endurance;Motor;Pain;Safety PT Transfers Functional Problem(s): Bed Mobility;Bed to Chair;Car;Furniture PT Locomotion Functional Problem(s): Ambulation;Stairs PT Plan PT Intensity: Minimum of 1-2 x/day ,45 to 90 minutes PT Frequency: 5 out of 7 days PT Duration Estimated Length of Stay: 7 days PT Treatment/Interventions: Ambulation/gait training;Balance/vestibular training;Community reintegration;Discharge planning;DME/adaptive equipment instruction;Functional mobility training;Pain management;Splinting/orthotics;Therapeutic Activities;UE/LE Strength taining/ROM;UE/LE Coordination activities;Therapeutic Exercise;Stair training;Patient/family education;Neuromuscular re-education;Functional electrical stimulation;Wheelchair propulsion/positioning PT Transfers Anticipated Outcome(s): mod I PT Locomotion Anticipated Outcome(s): mod I PT Recommendation Follow Up Recommendations: Outpatient PT Patient destination: Home Equipment Recommended: To be determined Equipment Details: pt has BSC, RW  Skilled Therapeutic Intervention Brace donned in supine with pt able to direct donning of brace.  Pt able to roll with supervision, supine to sit with increased time with supervision.  Sit to stand from bed and BSC with  min A, cues for UE placement. Pt able to gait in room with min A with RW, gait in hallway with min A up to 200'.  Stair negotiation x 3 stairs to simulate home entry with min A, pt with heavy reliance on UEs due to LE weakness.  Simulated car transfer with min A , cues for safety and technique. Pt requires frequent rests due to fatigue but is motivated to improve.  PT Evaluation Precautions/Restrictions Precautions Precautions: Back Required Braces or Orthoses: Spinal Brace Spinal Brace: Thoracolumbosacral orthotic Spinal Brace Comments: with LE attatchment Restrictions Weight Bearing Restrictions: No Pain Pain Assessment Pain Assessment: No/denies pain Home Living/Prior Functioning Home Living Available Help at Discharge: Family;Available 24 hours/day Type of Home: House Home Access: Stairs to enter [employer] of Steps: 3 Entrance Stairs-Rails: Right;Left Home Layout: One level  Lives With: Spouse Prior Function Level of Independence: Independent with basic ADLs;Independent with gait;Independent with transfers  Able to Take Stairs?: Yes Driving: Yes Vocation: Retired  Associate Professor Overall Cognitive Status: Within Functional Limits for tasks assessed Orientation Level: Oriented X4 Sensation Sensation Light Touch: Appears Intact Proprioception: Appears Intact Coordination Gross Motor Movements are Fluid and Coordinated: Yes Motor  Motor Motor: Abnormal postural alignment and control Motor - Skilled Clinical Observations: generalized weakness   Trunk/Postural Assessment  Cervical Assessment Cervical Assessment: Within Functional Limits Thoracic Assessment Thoracic Assessment:  (TLSO) Lumbar Assessment Lumbar Assessment:  (TLSO) Postural Control Postural Control: Deficits on evaluation Righting Reactions: delayed  Balance Balance Balance Assessed: Yes Dynamic Standing Balance Dynamic Standing - Comments: min A with RW Extremity Assessment      RLE  Assessment RLE Assessment:  (grossly 3/5) LLE Assessment LLE Assessment:  (grossly 3/5)   See Function Navigator  for Current Functional Status.   Refer to Care Plan for Long Term Goals  Recommendations for other services: None  Discharge Criteria: Patient will be discharged from PT if patient refuses treatment 3 consecutive times without medical reason, if treatment goals not met, if there is a change in medical status, if patient makes no progress towards goals or if patient is discharged from hospital.  The above assessment, treatment plan, treatment alternatives and goals were discussed and mutually agreed upon: by patient and by family  DONAWERTH,KAREN 08/01/2016, 12:47 PM

## 2016-08-01 NOTE — Patient Care Conference (Signed)
Inpatient RehabilitationTeam Conference and Plan of Care Update Date: 08/01/2016   Time: 2:50 PM    Patient Name: Donald [name]      Medical Record Number: [id number]  Date of Birth: 14-Sep-1940 Sex: Male         Room/Bed: 4M06C/4M06C-01 Payor Info: Payor: [place] / Plan: BCBS MEDICARE / Product Type: *No Product type* /    Admitting Diagnosis: PARA PARESIS  Admit Date/Time:  07/31/2016  7:12 PM Admission Comments: No comment available   Primary Diagnosis:  Paraparesis of both lower limbs (HCC) Principal Problem: Paraparesis of both lower limbs [facility])  Patient Active Problem List   Diagnosis Date Noted  . Post-operative pain   . Lumbar radiculopathy 08/01/2016  . Paraparesis of both lower limbs ([place]) 07/31/2016  . Discitis   . History of lumbar surgery   . Surgery, elective   . Neurologic gait disorder   . Chronic pain syndrome   . Benign prostatic hyperplasia   . Acute blood loss anemia   . Constipation due to pain medication   . Paraparesis ([place])   . Microcytic anemia 07/21/2016  . Unintentional weight loss 07/21/2016  . Status post lumbar laminectomy 07/21/2016  . Spinal stenosis of lumbar region 07/21/2016  . Abscess in epidural space of lumbar spine   . Candidiasis   . 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 Propionibacterium infection   . 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 Discitis of lumbar region 06/29/2016  . Bilateral lower extremity pain   . Radicular pain 09/18/2013  . Hyperlipidemia 10/20/2011  . Insomnia 10/20/2011  . Prostate cancer ([place]) 10/20/2011  . Personal history of colonic polyps 12/16/2010    Expected Discharge Date: Expected Discharge Date: 08/08/16  Team Members Present: Physician leading conference: Dr. Alger Simons Social Worker Present: Lennart Pall, LCSW Nurse Present: Heather Roberts, RN PT Present: Lavone Nian, PT;Other (comment) Roderic Ovens, PT) OT Present: Roanna Epley, [place],  OT SLP Present: Weston Anna, SLP PPS Coordinator present : Daiva Nakayama, RN, CRRN     Current Status/Progress Goal Weekly Team Focus  Medical   lumbar osteomyelitis, s/p resection/fusion. on iv abx long term. pain control issues  improve pain tolerance  wound care, ID mgt, pain control, anemia   Bowel/Bladder   Continent of bowel and bladder  Remain continent of bowel and bladder  Assist with toileting needs   Swallow/Nutrition/ Hydration             ADL's   Min A with AE  Supervision-Mod I   AE training, standing balance/endurance, safety awareness, discharge planning, caregiver education   Mobility   min A with gait, transfers, bed mobility  mod I gait, transfers, supervision stairs  LE strength, transfers, endurance   Communication             Safety/Cognition/ Behavioral Observations            Pain   pain managed with schedule oxy and prn   Pain managed at or below 3  Assess pain q shift and prn   Skin   lower back incision with steri strips, bottom rash patient is using home oinment needs to be okayed   No new skin breakdown/injury  Assess skin q shift and prn    Rehab Goals Patient on target to meet rehab goals: Yes *See Care Plan and progress notes for long and short-term goals.  Barriers to Discharge: pain mgt, need for long term abx    Possible  Resolutions to Barriers:  SW to assist with abx plan, titrate meds to effect, appropriate brace wear    Discharge Planning/Teaching Needs:  Home with wife who can provide 24/7 assistance.  NA   Team Discussion:  Excellent 1st day with therapies.  Pain under control.  Supervison/ min assist on eval and supervision goals.    Revisions to Treatment Plan:  None   Continued Need for Acute Rehabilitation Level of Care: The patient requires daily medical management by a physician with specialized training in physical medicine and rehabilitation for the following conditions: Daily direction of a multidisciplinary physical  rehabilitation program to ensure safe treatment while eliciting the highest outcome that is of practical value to the patient.: Yes Daily medical management of patient stability for increased activity during participation in an intensive rehabilitation regime.: Yes Daily analysis of laboratory values and/or radiology reports with any subsequent need for medication adjustment of medical intervention for : Neurological problems;Post surgical problems;Wound care problems  Donald [name] 08/04/2016, 12:16 PM

## 2016-08-01 NOTE — Progress Notes (Signed)
Donald [name] Rehab Admission Coordinator Signed Physical Medicine and Rehabilitation  PMR Pre-admission Date of Service: 07/28/2016 3:34 PM  Related encounter: ED to Hosp-Admission (Discharged) from 07/20/2016 in [place]       [] Hide copied text PMR Admission Coordinator Pre-Admission Assessment  Patient: Donald [name] is an 75 y.o., male MRN: [id number] DOB: 02-Jun-1941 Height: 6; Weight: 195 lbs.                                                                                                                                              Insurance Information HMO: X    PPO:      PCP:      IPA:      80/20:      OTHER:  PRIMARY: BCBS Medicare       Policy#: [id number]      Subscriber: Self CM Name: Montine Circle   Phone#: [phone]     Fax#: [phone] Pre-Cert#: [id number], approved 07/31/16 to 08/09/16 with updates due after conference 08/09/16      Employer: Retired Benefits:  Phone #: [phone]     Name: Amy Eff. Date: 09/12/15     Deduct: $0      Out of Pocket Max: $6,700      Life Max: none CIR: $300 a day, days 1-6; $0 a day, days 7+      SNF: $0 a day, days 1-20; $164.50 a day, days 21-100 Outpatient: PT/OT     Co-Pay: $40 a visit  Home Health: PT/OT      Co-Pay: None DME: 80%     Co-Pay: 20% Providers: in-network  Medicaid Application Date:       Case Manager:  Disability Application Date:       Case Worker:   Emergency Tax adviser Information    Name Relation Home Work Mobile   Lords,Nancy Spouse [phone]  [phone]   Jairen, Trembly   [phone]   Marcell, Hocevar   [phone]     Current Medical History  Patient Admitting Diagnosis: Paraparesis secondary to osteomyelitis and epidural abscess  History of Present Illness: Donald [name] a 75 y.o.malewith history of prostate cancer, lumbar stenosis s/p L3-L5 decompression 7/20/17who started  developing BLE pain with spasms and was found to have L4-L5 osteomyelitis with epidural abscess. He was admitted for I &D of lumbar wound on 10/19/17and discharged to home with plans for IV Vanc/Ceftriaxone to continue thorough 08/23/16. He was readmitted with 11/9/17with increase in back pain with radiculopathyand decline in mobility. Follow up MRI showed recurrence of abscess and progressive Discitis and osteomyelitis with destruction of L4 and L5 vertebral body. Dr. Rolena Infante consulted for second opinion and concurred with recommendations of surgical intervention. Patient underwent revision of L4/5 decompression with I and D of infected material and posterior fusion  L3-S1.  Dr. Linus Salmons recommends continuing current antibiotics till cultures finalize. In process of receiving 2 units PRBC for ABLA. TLSO with left leg extension ordered for support and therapy evaluations completed today revealing decline in mobility as well as ability to carry out self care tasks. CIR recommended for follow up therapy and patient admitted       Past Medical History      Past Medical History:  Diagnosis Date  . Adenomatous polyps 10/2002, 06/2009  . Arthritis   . BPH (benign prostatic hyperplasia)   . Hyperlipemia   . Prostate cancer ([place])   . Spinal stenosis   . Wears glasses     Family History  family history includes Cancer in his brother; Diabetes in his mother; Heart disease in his mother; Prostate cancer in his father.  Prior Rehab/Hospitalizations:  Has the patient had major surgery during 100 days prior to admission? Yes  Current Medications   Current Facility-Administered Medications:  .  0.9 %  sodium chloride infusion, 250 mL, Intravenous, PRN, Rexanne Mano, MD, Stopped at 07/25/16 1922 .  0.9 %  sodium chloride infusion, , Intravenous, Once, Josephine Igo, MD .  0.9 %  sodium chloride infusion, , Intravenous, Continuous, Kayla J McKenzie, PA-C, Last Rate: 75 mL/hr at 07/31/16  0006 .  0.9 %  sodium chloride infusion, 250 mL, Intravenous, Continuous, Kayla J McKenzie, PA-C .  acetaminophen (TYLENOL) tablet 650 mg, 650 mg, Oral, Q4H PRN, 650 mg at 07/26/16 1137 **OR** acetaminophen (TYLENOL) suppository 650 mg, 650 mg, Rectal, Q4H PRN, Kayla J McKenzie, PA-C .  alum & mag hydroxide-simeth (MAALOX/MYLANTA) 200-200-20 MG/5ML suspension 30 mL, 30 mL, Oral, Q6H PRN, Lennie Muckle McKenzie, PA-C .  cefTAZidime (FORTAZ) 2 g in dextrose 5 % 50 mL IVPB, 2 g, Intravenous, Q8H, Mariel Aloe, MD, 2 g at 07/31/16 1046 .  diphenhydrAMINE-zinc acetate (BENADRYL) 2-0.1 % cream, , Topical, Daily PRN, Theodis Blaze, MD .  ferrous sulfate tablet 325 mg, 325 mg, Oral, BID WC, Dron Tanna Furry, MD, 325 mg at 07/31/16 0854 .  hydrOXYzine (ATARAX/VISTARIL) tablet 25 mg, 25 mg, Oral, TID PRN, Theodis Blaze, MD, 25 mg at 07/24/16 1531 .  LORazepam (ATIVAN) injection 1 mg, 1 mg, Intravenous, Q4H PRN, Domenic Moras, PA-C, 1 mg at 07/20/16 1827 .  MEDLINE mouth rinse, 15 mL, Mouth Rinse, BID, Theodis Blaze, MD, 15 mL at 07/31/16 1047 .  menthol-cetylpyridinium (CEPACOL) lozenge 3 mg, 1 lozenge, Oral, PRN **OR** phenol (CHLORASEPTIC) mouth spray 1 spray, 1 spray, Mouth/Throat, PRN, Kayla J McKenzie, PA-C .  morphine 2 MG/ML injection 1-4 mg, 1-4 mg, Intravenous, Q3H PRN, Lennie Muckle McKenzie, PA-C, 2 mg at 07/26/16 0444 .  multivitamin with minerals tablet 1 tablet, 1 tablet, Oral, Daily, Rexanne Mano, MD, 1 tablet at 07/31/16 1046 .  ondansetron (ZOFRAN) injection 4 mg, 4 mg, Intravenous, Q4H PRN, Lennie Muckle McKenzie, PA-C .  oxyCODONE (OXYCONTIN) 12 hr tablet 20 mg, 20 mg, Oral, Q12H, Phylliss Bob, MD, 20 mg at 07/31/16 1046 .  oxyCODONE-acetaminophen (PERCOCET/ROXICET) 5-325 MG per tablet 1-2 tablet, 1-2 tablet, Oral, Q6H PRN, Rexanne Mano, MD, 2 tablet at 07/31/16 [id number] .  polyethylene glycol (MIRALAX / GLYCOLAX) packet 17 g, 17 g, Oral, BID, Mariel Aloe, MD, 17 g at 07/31/16 1046 .   senna-docusate (Senokot-S) tablet 1 tablet, 1 tablet, Oral, BID, Theodis Blaze, MD, 1 tablet at 07/31/16 1046 .  sodium chloride flush (NS) 0.9 % injection 10-40 mL,  10-40 mL, Intracatheter, PRN, Rexanne Mano, MD, 10 mL at 07/31/16 0516 .  sodium chloride flush (NS) 0.9 % injection 3 mL, 3 mL, Intravenous, Q12H, Rexanne Mano, MD, 3 mL at 07/30/16 1000 .  sodium chloride flush (NS) 0.9 % injection 3 mL, 3 mL, Intravenous, PRN, Rexanne Mano, MD .  sodium chloride flush (NS) 0.9 % injection 3 mL, 3 mL, Intravenous, Q12H, Kayla J McKenzie, PA-C, 3 mL at 07/30/16 1000 .  sodium chloride flush (NS) 0.9 % injection 3 mL, 3 mL, Intravenous, PRN, Lennie Muckle McKenzie, PA-C .  vancomycin (VANCOCIN) IVPB 1000 mg/200 mL premix, 1,000 mg, Intravenous, Q12H, Mariel Aloe, MD, 1,000 mg at 07/31/16 1204 .  zolpidem (AMBIEN) tablet 5 mg, 5 mg, Oral, QHS PRN, Rexanne Mano, MD, 5 mg at 07/23/16 2231  Patients Current Diet: Diet regular Room service appropriate? Yes; Fluid consistency: Thin  Precautions / Restrictions Precautions Precautions: Back Precaution Booklet Issued: No Precaution Comments: Reviewed back precautions during tx. Spinal Brace: Thoracolumbosacral orthotic (with left leg extension) Spinal Brace Comments: on upon arrival, pt able to instruct PT on adjustment Restrictions Weight Bearing Restrictions: No   Has the patient had 2 or more falls or a fall with injury in the past year?No  Prior Activity Level Community (5-7x/wk): Prior to admission patient drove and was out in the community daily.  He worked on [employer] for the [employer] and enjoyed playing pool and billiards 3 times a week with friends.  He also was active in his church's choir.    Home Assistive Devices / Equipment Home Assistive Devices/Equipment: None Home Equipment: Environmental consultant - 2 wheels, [place] - single point, Crutches, Bedside commode, Shower seat, Adaptive equipment  Prior Device Use: Indicate  devices/aids used by the patient prior to current illness, exacerbation or injury? See above  Prior Functional Level Prior Function Level of Independence: Needs assistance Gait / Transfers Assistance Needed: Using RW ADL's / Homemaking Assistance Needed: Needs min assist at times for LB dressing Comments: using rw PTA  Self Care: Did the patient need help bathing, dressing, using the toilet or eating?  Independent  Indoor Mobility: Did the patient need assistance with walking from room to room (with or without device)? Independent  Stairs: Did the patient need assistance with internal or external stairs (with or without device)? Independent  Functional Cognition: Did the patient need help planning regular tasks such as shopping or remembering to take medications? Independent  Current Functional Level Cognition Overall Cognitive Status: Within Functional Limits for tasks assessed Orientation Level: Oriented X4    Extremity Assessment (includes Sensation/Coordination) Upper Extremity Assessment: Generalized weakness  Lower Extremity Assessment: Defer to PT evaluation RLE Deficits / Details: grossly 3/5 LLE Deficits / Details: grossly 3/5   ADLs Overall ADL's : Needs assistance/impaired Grooming: Set up, Bed level, Oral care Grooming Details (indicate cue type and reason): Poor balance Upper Body Bathing: Moderate assistance (simulated) Upper Body Bathing Details (indicate cue type and reason): Poor balance, TLSO Lower Body Bathing: Total assistance Upper Body Dressing : Moderate assistance, Sitting Upper Body Dressing Details (indicate cue type and reason): Poor balance, TLSO Lower Body Dressing: Total assistance Toilet Transfer: Moderate assistance, +2 for physical assistance, +2 for safety/equipment Toileting- Clothing Manipulation and Hygiene: Total assistance, Sit to/from stand Functional mobility during ADLs: Moderate assistance, +2 for physical assistance, +2 for  safety/equipment, Rolling walker General ADL Comments: Pt has shower chair, reacher, sock aid and LH shoe horn at home  Mobility Overal bed mobility: Needs Assistance, +2 for physical assistance Bed Mobility: Rolling, Sidelying to Sit Rolling: Mod assist, +2 for safety/equipment Sidelying to sit: Mod assist, +2 for physical assistance, HOB elevated Sit to sidelying: Mod assist, +2 for physical assistance, HOB elevated General bed mobility comments: Pt OOB in chair upon arrival   Transfers Overall transfer level: Needs assistance Equipment used: Rolling walker (2 wheeled) Transfers: Sit to/from Stand Sit to Stand: Min assist Stand pivot transfers: Mod assist, +2 physical assistance General transfer comment: cues for hand placement and technique   Ambulation / Gait / Stairs / Wheelchair Mobility Ambulation/Gait Ambulation/Gait assistance: Museum/gallery curator (Feet): 120 Feet Assistive device: Rolling walker (2 wheeled) Gait Pattern/deviations: Step-through pattern, Decreased stride length General Gait Details: multimodal cues for bilat knee extension during stance phase and for posture; pt unsteady due to weak bilat LE and maintaining bilat flexed knees  Gait velocity: decreased Gait velocity interpretation: Below normal speed for age/gender   Posture / Balance Dynamic Sitting Balance Sitting balance - Comments: trunk supported and trunk unsupported in chair Balance Overall balance assessment: Needs assistance Sitting-balance support: No upper extremity supported, Feet supported Sitting balance-Leahy Scale: Fair Sitting balance - Comments: trunk supported and trunk unsupported in chair Postural control: Posterior lean, Left lateral lean Standing balance support: Bilateral upper extremity supported, During functional activity Standing balance-Leahy Scale: Poor Standing balance comment: limited by back pain and dependent on RW to off-load lower extremities   Special  needs/care consideration BiPAP/CPAP: No CPM: No Continuous Drip IV: No Dialysis: No Life Vest: No Oxygen: No Special Bed: No Trach Size: No Wound Vac (area): No       Skin: Rash bottocks and thighs; Surgical incision loser back                              Bowel mgmt: 07/25/16 Bladder mgmt: Continent with urinal  Diabetic mgmt: No Other: Patient with hard, clam shell brace with chest plate and left lower leg extension     Previous Home Environment Living Arrangements: Spouse/significant other Available Help at Discharge: Family, Available 24 hours/day Type of Home: House Home Layout: One level Home Access: Stairs to enter Entrance Stairs-Rails: Left Entrance Stairs-Number of Steps: 3 Bathroom Shower/Tub: Multimedia programmer: Associate Professor Accessibility: Yes Home Care Services: Yes Type of Home Care Services: Home RN  Discharge Living Setting Plans for Discharge Living Setting: Patient's home Type of Home at Discharge: House Discharge Home Layout: Two level, Able to live on main level with bedroom/bathroom Alternate Level Stairs-Number of Steps: 12-14 Discharge Home Access: Stairs to enter Entrance Stairs-Rails: Left Entrance Stairs-Number of Steps: 3 steps Discharge Bathroom Shower/Tub: Walk-in shower Discharge Bathroom Toilet: Standard (but modified for handicapped height ) Discharge Bathroom Accessibility: Yes How Accessible: Accessible via walker Does the patient have any problems obtaining your medications?: No  Social/Family/Support Systems Patient Roles: Spouse, Parent Contact Information: Spouse Walker Kehr  Anticipated Caregiver: Spouse  Anticipated Caregiver's Contact Information: [phone] Ability/Limitations of Caregiver: None Caregiver Availability: 24/7 Discharge Plan Discussed with Primary Caregiver: Yes Is Caregiver In Agreement with Plan?: Yes Does Caregiver/Family have Issues with Lodging/Transportation while Pt is in  Rehab?: No  Goals/Additional Needs Patient/Family Goal for Rehab: PT/OT Supervision-Min assist  Expected length of stay: 7-10 days  Cultural Considerations: None Dietary Needs: Regular and thin  Equipment Needs: TBD Special Service Needs: None Additional Information: Prior home RN care provided by [facility]  Pt/Family Agrees  to Admission and willing to participate: Yes Program Orientation Provided & Reviewed with Pt/Caregiver Including Roles  & Responsibilities: Yes Additional Information Needs: None Information Needs to be Provided By: N/A  Decrease burden of Care through IP rehab admission: No  Possible need for SNF placement upon discharge: No  Patient Condition: This patient's medical and functional status has changed since the consult dated: 07/27/16 in which the Rehabilitation Physician determined and documented that the patient's condition is appropriate for intensive rehabilitative care in an inpatient rehabilitation facility. See "History of Present Illness" (above) for medical update. Functional changes are: min assist transfers , min assist with RW 120' Patient's medical and functional status update has been discussed with the Rehabilitation physician and patient remains appropriate for inpatient rehabilitation. Will admit to inpatient rehab today.   Preadmission Screen Completed By:  Donald [name], with updates by Donald [name]  07/31/2016 3:15 PM ______________________________________________________________________   Discussed status with Dr. Posey Pronto on 07/31/16 at  1515  and received telephone approval for admission today.  Admission Coordinator:  Donald [name], time [id number] /Date 07/31/16       Cosigned by: Donald Lorie Phenix, MD at 07/31/2016 3:26 PM  Revision History

## 2016-08-01 NOTE — Progress Notes (Signed)
[facility] PHYSICAL MEDICINE & REHABILITATION     PROGRESS NOTE    Subjective/Complaints: Had a good night. Pain a 4/10 at rest. Bladder frequency due to IVF. No fever/chills  ROS: Pt denies fever, rash/itching, headache, blurred or double vision, nausea, vomiting, abdominal pain, diarrhea, chest pain, shortness of breath, palpitations, dysuria, dizziness,   bleeding, anxiety, or depression    Objective: Vital Signs: Blood pressure 114/66, pulse 66, temperature 97.9 F (36.6 C), temperature source Oral, resp. rate 18, weight 88 kg (194 lb 0.1 oz), SpO2 98 %. No results found.  Recent Labs  08/01/16 0438  WBC 4.9  HGB 8.2*  HCT 26.2*  PLT 204    Recent Labs  07/31/16 0515 08/01/16 0438  NA 142 140  K 3.5 3.3*  CL 110 108  GLUCOSE 94 105*  BUN 8 8  CREATININE 0.92 0.87  CALCIUM 8.2* 7.9*   CBG (last 3)  No results for input(s): GLUCAP in the last 72 hours.  Wt Readings from Last 3 Encounters:  08/01/16 88 kg (194 lb 0.1 oz)  07/31/16 88 kg (193 lb 14.4 oz)  07/20/16 88.5 kg (195 lb)    Physical Exam:  Constitutional: no distress  HENT:  Head: Normocephalic and atraumatic.  Eyes: EOMI Neck: Normal range of motion. Neck supple.  Cardiovascular: RRR   Respiratory: cta without wheezes or rales GI: soft BS+  Musculoskeletal: trace bilateral LE edema Neurological: He is alert and oriented to person, place, and time.  Motor: 4+/5 throughout, UE, DTR's brisk LE: 3+ HF, 4- KE and 4+ ADF/PF. Sensation intact to light touch throughout all 4 limbs Skin: surgical incision clean and intact with steristrips  Psychiatric: pleasant and appropriate Assessment/Plan: 1. Functional and mobility deficits  secondary to lumbar discitis with radiculopathy, s/p decompression and fusion which require 3+ hours per day of interdisciplinary therapy in a comprehensive inpatient rehab setting. Physiatrist is providing close team supervision and 24 hour management of active medical  problems listed below. Physiatrist and rehab team continue to assess barriers to discharge/monitor patient progress toward functional and medical goals.  Function:  Bathing Bathing position      Bathing parts      Bathing assist        Upper Body Dressing/Undressing Upper body dressing                    Upper body assist        Lower Body Dressing/Undressing Lower body dressing                                  Lower body assist        Toileting Toileting          Toileting assist     Transfers Chair/bed transfer             Locomotion Ambulation           Wheelchair          Cognition Comprehension Comprehension assist level: Follows complex conversation/direction with no assist  Expression Expression assist level: Expresses complex ideas: With no assist  Social Interaction Social Interaction assist level: Interacts appropriately with others - No medications needed.  Problem Solving Problem solving assist level: Solves complex problems: Recognizes & self-corrects  Memory Memory assist level: Complete Independence: No helper   Medical Problem List and Plan: 1.  Weakness, gait abnormality, balance deficits secondary to Lumbar discitis  with radiculopathy.  -begin CIR therapies. Very motivated 2.  DVT Prophylaxis/Anticoagulation: SCD 3. Pain Management: Improved on OxyContin 20 mg bid and prn oxycodone.   -continue with this regimen for now.   -titrate as/if needed 4. Mood: Team to provide ego support to help with adjustment reaction. LCSW to follow for evaluation and support.  5. Neuropsych: This patient is capable of making decisions on his own behalf. 6. Skin/Wound Care: Monitor wound daily for healing. Maintain adequate nutritional and hydration status. Pressure relief from brace 7. Fluids/Electrolytes/Nutrition: good po  -I personally reviewed the patient's labs today.    -replace potassium   -encourage liquids  8. Diskitis  with osteomyelitis: Vancomycin/Ceftazidime D # 7/ 56+             continue to monitor levels/ follow renal status 9.  BPH: Voiding without difficulty.  10. ABLA: hgb down to 8.2 today  -Fe+ supp,  -serial monitoring 11. Constipation: managed with senna and miralax on board. LOS (Days) 1 A FACE TO FACE EVALUATION WAS PERFORMED  SWARTZ,ZACHARY T 08/01/2016 9:07 AM

## 2016-08-01 NOTE — Evaluation (Signed)
Occupational Therapy Assessment and Plan  Patient Details  Name: Donald [name] MRN: [id number] Date of Birth: 1941/03/11  OT Diagnosis: abnormal posture, acute pain and muscle weakness (generalized) Rehab Potential: Rehab Potential (ACUTE ONLY): Good ELOS: 7 days   Today's Date: 08/01/2016 OT Individual Time: 1400-1500 OT Individual Time Calculation (min): 60 min      Problem List: Patient Active Problem List   Diagnosis Date Noted  . Lumbar radiculopathy 08/01/2016  . 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 Discitis   . History of lumbar surgery   . Surgery, elective   . Neurologic gait disorder   . Chronic pain syndrome   . Benign prostatic hyperplasia   . Acute blood loss anemia   . Constipation due to pain medication   . Paraparesis ([place])   . Microcytic anemia 07/21/2016  . Unintentional weight loss 07/21/2016  . Status post lumbar laminectomy 07/21/2016  . Spinal stenosis of lumbar region 07/21/2016  . Abscess in epidural space of lumbar spine   . Candidiasis   . 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 Propionibacterium infection   . 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 Discitis of lumbar region 06/29/2016  . Bilateral lower extremity pain   . Radicular pain 09/18/2013  . Hyperlipidemia 10/20/2011  . Insomnia 10/20/2011  . Prostate cancer ([place]) 10/20/2011  . Personal history of colonic polyps 12/16/2010    Past Medical History:  Past Medical History:  Diagnosis Date  . Adenomatous polyps 10/2002, 06/2009  . Arthritis   . BPH (benign prostatic hyperplasia)   . Hyperlipemia   . Prostate cancer ([place])   . Spinal stenosis   . Wears glasses    Past Surgical History:  Past Surgical History:  Procedure Laterality Date  . ANTERIOR CERVICAL DECOMP/DISCECTOMY FUSION N/A 09/18/2013   Procedure: Anterior cervical decompression fusion, cervical 4-5, cervical 5-6, cervical 6-7 with instrumentation and allograft;  Surgeon: Sinclair Ship, MD;   Location: [facility];  Service: Orthopedics;  Laterality: N/A;  Anterior cervical decompression fusion, cervical 4-5, cervical 5-6, cervical 6-7 with instrumentation and allograft  . COLONOSCOPY    . COLONOSCOPY W/ POLYPECTOMY  11/04/2002   Sigmoid adenomas x2, 1 cm maximum  . COLONOSCOPY W/ POLYPECTOMY  06/08/2009   6 polyps, largest 8 mm, at least 3 adenomas  . CYSTOSCOPY  04/2010  . ESOPHAGOGASTRODUODENOSCOPY  11/04/2002   Duodenitis, gastritis without H. pylori   . HIP ARTHROPLASTY  04/2010   Left - Dr. Mayer Camel, right 2012  . INCISION AND DRAINAGE OF WOUND N/A 07/25/2016   Procedure: IRRIGATION AND DEBRIDEMENT LUMBAR DISCITIS;  Surgeon: Phylliss Bob, MD;  Location: [facility];  Service: Orthopedics;  Laterality: N/A;  . JOINT REPLACEMENT    . LUMBAR LAMINECTOMY/DECOMPRESSION MICRODISCECTOMY N/A 03/30/2016   Procedure: LUMBAR 3-4, LUMBAR 4-5 DECOMPRESSION ;  Surgeon: Phylliss Bob, MD;  Location: [facility];  Service: Orthopedics;  Laterality: N/A;  LUMBAR 3-4, LUMBAR 4-5 DECOMPRESSION   . LUMBAR WOUND DEBRIDEMENT N/A 06/29/2016   Procedure: LUMBAR WOUND EXPLORATION AND IRRIGATION AND  DEBRIDEMENT;  Surgeon: Phylliss Bob, MD;  Location: [facility];  Service: Orthopedics;  Laterality: N/A;  . PROSTATE SURGERY    . RETROPUBIC PROSTATECTOMY  09/2003  . TONSILLECTOMY    . UPPER GASTROINTESTINAL ENDOSCOPY      Assessment & Plan Clinical Impression: Patient is a 75 y.o. year old male history of prostate cancer, lumbar stenosis s/p L3-L5 decompression 7/20/17who started developing BLE pain with spasms and was found to have L4-L5  osteomyelitis with epidural abscess. He was admitted for I &D of lumbar wound on 10/19/17and discharged to home with plans for IV Vanc/Ceftriaxone to continue thorough 08/23/16. He was readmitted with 11/9/17with increase in back pain with radiculopathyand decline in mobility. Follow up MRI showed recurrence of abscess and progressive  Discitis and osteomyelitis with destruction of L4 and  L5 vertebral body. Dr. Rolena Infante consulted for second opinion and concurred with recommendations of surgical intervention. Patient underwent revision of L4/5 decompression with I and D of infected material and posterior fusion L3-S1. He has received  2 units PRBC for ABLA and pain control/muscle spasms are improving. Dr. Linus Salmons recommends continuing vancomycin/ceftazidime for 8 weeks at least and follow up with RICD in 4 weeks.  TLSO with left leg extension ordered for support and therapy revealing decline in mobility as well as ability to carry out self care tasks. CIR recommended for follow up therapy. Patient transferred to CIR on 07/31/2016 .    Patient currently requires min with basic self-care skills secondary to muscle weakness, decreased cardiorespiratoy endurance and decreased sitting balance, decreased standing balance, decreased balance strategies and and precautions.  Prior to hospitalization, patient could complete IADLs with independent .  Patient will benefit from skilled intervention to decrease level of assist with basic self-care skills prior to discharge home with care partner.  Anticipate patient will require 24 hour supervision and follow up home health.  OT - End of Session Activity Tolerance: Decreased this session Endurance Deficit: Yes Endurance Deficit Description: multiple rest breaks secondary to fatigue OT Assessment Rehab Potential (ACUTE ONLY): Good OT Patient demonstrates impairments in the following area(s): Balance;Endurance;Motor;Pain;Safety OT Basic ADL's Functional Problem(s): Grooming;Bathing;Dressing;Toileting OT Transfers Functional Problem(s): Toilet OT Additional Impairment(s): None OT Plan OT Intensity: Minimum of 1-2 x/day, 45 to 90 minutes OT Frequency: 5 out of 7 days OT Duration/Estimated Length of Stay: 7 days OT Treatment/Interventions: Medical illustrator training;Community reintegration;Discharge planning;Self Care/advanced ADL  retraining;Therapeutic Exercise;Wheelchair propulsion/positioning;DME/adaptive equipment instruction;Pain management;UE/LE Strength taining/ROM;Patient/family education;Splinting/orthotics;UE/LE Coordination activities;Therapeutic Activities;Psychosocial support;Functional mobility training OT Self Feeding Anticipated Outcome(s): n/a OT Basic Self-Care Anticipated Outcome(s): mod I - min A OT Toileting Anticipated Outcome(s): min A OT Bathroom Transfers Anticipated Outcome(s): mod I  OT Recommendation Recommendations for Other Services: Neuropsych consult Patient destination: Home Follow Up Recommendations: 24 hour supervision/assistance Equipment Recommended: To be determined   Skilled Therapeutic Intervention  Upon entering the room, pt seated in recliner chair with wife and son present in the room. Pt agreeable to OT intervention. OT educated pt and family on OT intervention, POC, and goals with pt and family verbalizing understanding and agreement. OT provided pt with toilet aide this session and educated pt on use and demonstrated how to apply toilet paper. Pt returned demonstration with min verbal cues. Pt requesting to wash this session. Pt returning to supine in bed in order for brace to be removed. Pt provided cues to family for removal of brace. Pt engaged in bathing from supine with rolls L <> R secondary to precautions. Pt donning hospital gown at end of session. Call bell and all needed items within reach upon exiting the room.    OT Evaluation Precautions/Restrictions  Precautions Precautions: Back Precaution Comments: Reviewed back precautions during tx. Required Braces or Orthoses: Spinal Brace Spinal Brace: Thoracolumbosacral orthotic Spinal Brace Comments: with LE attatchment Restrictions Weight Bearing Restrictions: No Pain Pain Assessment Pain Assessment: 0-10 Home Living/Prior Functioning Home Living Family/patient expects to be discharged to:: Private  residence Living Arrangements: Spouse/significant other Available Help at Discharge:  Family, Available 24 hours/day Type of Home: House Home Access: Stairs to enter [employer] of Steps: 3 Entrance Stairs-Rails: Right, Left Home Layout: One level Bathroom Shower/Tub: Multimedia programmer: Standard  Lives With: Spouse Prior Function Level of Independence: Independent with basic ADLs, Independent with gait, Independent with transfers  Able to Take Stairs?: Yes Driving: Yes Vocation: Retired Comments: using rw PTA Vision/Perception  Vision- History Baseline Vision/History: Wears glasses Wears Glasses: At all times Patient Visual Report: No change from baseline Vision- Assessment Vision Assessment?: No apparent visual deficits  Cognition Overall Cognitive Status: Within Functional Limits for tasks assessed Arousal/Alertness: Awake/alert Orientation Level: Person;Place;Situation Person: Oriented Place: Oriented Situation: Oriented Year: 2017 Month: November Day of Week: Correct Memory: Appears intact Immediate Memory Recall: Sock;Blue;Bed Memory Recall: Sock;Blue;Bed Memory Recall Sock: Without Cue Memory Recall Blue: Without Cue Memory Recall Bed: Without Cue Safety/Judgment: Appears intact Sensation Sensation Light Touch: Appears Intact Stereognosis: Not tested Hot/Cold: Appears Intact Proprioception: Appears Intact Coordination Gross Motor Movements are Fluid and Coordinated: Yes Motor  Motor Motor: Abnormal postural alignment and control Motor - Skilled Clinical Observations: generalized weakness Trunk/Postural Assessment  Cervical Assessment Cervical Assessment: Within Functional Limits Thoracic Assessment Thoracic Assessment:  (TLSO) Lumbar Assessment Lumbar Assessment:  (TLSO) Postural Control Postural Control: Deficits on evaluation Righting Reactions: delayed  Balance Balance Balance Assessed: Yes Dynamic Sitting  Balance Sitting balance - Comments: trunk supported and trunk unsupported in chair Extremity/Trunk Assessment RUE Assessment RUE Assessment: Within Functional Limits LUE Assessment LUE Assessment: Within Functional Limits   See Function Navigator for Current Functional Status.   Refer to Care Plan for Long Term Goals  Recommendations for other services: Neuropsych  Discharge Criteria: Patient will be discharged from OT if patient refuses treatment 3 consecutive times without medical reason, if treatment goals not met, if there is a change in medical status, if patient makes no progress towards goals or if patient is discharged from hospital.  The above assessment, treatment plan, treatment alternatives and goals were discussed and mutually agreed upon: by patient and by family  Gypsy Decant 08/01/2016, 5:05 PM

## 2016-08-02 ENCOUNTER — Inpatient Hospital Stay (HOSPITAL_COMMUNITY): Payer: Medicare Other | Admitting: Occupational Therapy

## 2016-08-02 ENCOUNTER — Inpatient Hospital Stay (HOSPITAL_COMMUNITY): Payer: Medicare Other

## 2016-08-02 ENCOUNTER — Inpatient Hospital Stay (HOSPITAL_COMMUNITY): Payer: Medicare Other | Admitting: Physical Therapy

## 2016-08-02 LAB — AEROBIC/ANAEROBIC CULTURE W GRAM STAIN (SURGICAL/DEEP WOUND): Culture: NO GROWTH

## 2016-08-02 LAB — AEROBIC/ANAEROBIC CULTURE (SURGICAL/DEEP WOUND): CULTURE: NO GROWTH

## 2016-08-02 LAB — CBC
HEMATOCRIT: 28.1 % — AB (ref 39.0–52.0)
HEMOGLOBIN: 8.7 g/dL — AB (ref 13.0–17.0)
MCH: 24.9 pg — ABNORMAL LOW (ref 26.0–34.0)
MCHC: 31 g/dL (ref 30.0–36.0)
MCV: 80.5 fL (ref 78.0–100.0)
Platelets: 229 10*3/uL (ref 150–400)
RBC: 3.49 MIL/uL — ABNORMAL LOW (ref 4.22–5.81)
RDW: 17.3 % — AB (ref 11.5–15.5)
WBC: 5.1 10*3/uL (ref 4.0–10.5)

## 2016-08-02 MED ORDER — NYSTATIN 100000 UNIT/GM EX POWD
Freq: Two times a day (BID) | CUTANEOUS | Status: DC
  Administered 2016-08-02 – 2016-08-08 (×10): via TOPICAL
  Filled 2016-08-02: qty 15

## 2016-08-02 NOTE — Progress Notes (Signed)
Physical Therapy Session Note  Patient Details  Name: Donald [name] MRN: [id number] Date of Birth: [birth year]  Today's Date: 08/02/2016 PT Individual Time: [phone] and [id number] PT Individual Time Calculation (min): 57 min and 44 min   Skilled Therapeutic Interventions/Progress Updates:    Treatment 1: Pt received in bed with RN assisting him with donning brace; pt agreeable to tx, denying c/o pain. Session focused on BLE strengthening with pt utilizing cybex kinetron in sitting with increasing resistance in multiple bouts to pt fatigue. Pt tolerated standing without BUE support x ~5 minutes with min assist for balance 2/2 posterior lean and decreased weight bearing through LLE as compared to right. Pt completed multiple sit<>Stand transfers with BUE support and cuing to decrease LE extension/support onto mat table. Pt required min assist to complete transfer, as well as tactile cuing at L knee to prevent buckling; activity focused on BLE strengthening. Pt ambulated room<>gym with RW & min guard assist with cuing for forward gaze with poor/fair demo by pt. At end of session pt left in recliner with all needs within reach & wife present to supervise.  Pt's wife [name]) present for session. Pt reports he has had a 2nd rail installed at home but he is unsure whether he can access both rails simultaneously but wife believes he can.   Discussed HHPT versus OPPT following d/c; pt's wife able to take him to OPPT appointments if necessary.  Treatment 2: Pt received in recliner & agreeable to tx, denying c/o pain. Educated pt on TLSO and locking mechanism for LLE piece; educated pt on need to have pieced locked at all times except for sit<>Stand and stair negotiation. Pt very unhappy about locking mechanism, reporting TLSO portion pushes him posteriorly as a result of large step with LLE during ambulation. Provided education & cuing for modifications to pt's gait pattern, educated pt on shorter step  length LLE and importance of foot clearance with fair demo by pt. Pt ambulated throughout unit with RW & min guard assist with max cuing for forward gaze but poor demo by pt. Pt negotiated 8 steps (6") with B rails & min guard assist; pt demonstrated proper compensatory pattern with min cuing. Pt reported he negotiated stairs laterally with RW placed at top of steps, educated pt that this is probably how he will negotiate stairs when he returns home as well. Pt performed mini squats with RW for UE support and cuing for proper technique. At end of session pt left in recliner in room with all needs within reach & wife & RN present.   Therapy Documentation Precautions:  Precautions Precautions: Back Precaution Comments: Reviewed back precautions during tx. Required Braces or Orthoses: Spinal Brace Spinal Brace: Thoracolumbosacral orthotic Spinal Brace Comments: with LE attatchment Restrictions Weight Bearing Restrictions: No   See Function Navigator for Current Functional Status.   Therapy/Group: Individual Therapy  Waunita Schooner 08/02/2016, 9:47 AM

## 2016-08-02 NOTE — Progress Notes (Signed)
Pharmacy Antibiotic Note 75 y.o.males/p L3/L4, L4/L5 decompression in July, hospital admission (10/19-23/17) for osteomyelitis of lumbar spine with epidural abscess, and started a 8 week course of vanc/rocephin, readmitted on 11/9 with worsening abscess, s/p but unfortunately developed worsening abscess, s/p decompression I&D 11/14. ID changed antibiotics to vancomycin/ceftazidime, plan to treat for 8 weeks from surgery.  Renal function has been stable, scr 0.87.  Plan: Continue vancomycin 1 gm IV q12h with trough goal 15- 20 mcg/ml  Continue ceftazidime 2g/8h Weekly vancomycin trough on Thursday, next due 11/23 BMET Q 72 hrs   Weight: 194 lb 0.1 oz (88 kg)  Temp (24hrs), Avg:98.1 F (36.7 C), Min:98.1 F (36.7 C), Max:98.1 F (36.7 C)   Recent Labs Lab 07/27/16 0457 07/27/16 1358 07/29/16 0830 07/31/16 0515 08/01/16 0438 08/02/16 0610  WBC 9.5  --  6.2  --  4.9 5.1  CREATININE  --   --  0.85 0.92 0.87  --   VANCOTROUGH  --  19  --   --   --   --     Estimated Creatinine Clearance: 83 mL/min (by C-G formula based on SCr of 0.87 mg/dL).    Allergies  Allergen Reactions  . Iodine Anaphylaxis  . Ivp Dye [Iodinated Diagnostic Agents] Anaphylaxis    Antimicrobials this admission:  Vancomycin 10/19 >> (12/13) Ceftriaxone 10/20 >> 10/21, 10/23 >> 11/10 Ceftazidime 11/10>> (12/13) Fluconazole 11/10  Dose adjustments this admission:  10/23 VT 12 on 750mg  q12, increase to 1g q12 11/10 VT 18 on 1g q12 - cont 11/16 VT 19 on 1 gm q12 ( VT drawn 1 hr after dose due)   Microbiology results:  10/19 Lumbar wound: Propionibacterium acnes 11/12 MRSA PCR: negative  11/14 tissue intervertebral disc: gram stain: few GNR, few GVR, mod GPcocc in pairs, few GRP No growth x 4 days, AFB neg 11/14 swab of lumbar epidural: gram stain: mod GPcocci in pairs, rare GNRs, no growth 4 days  Thank you for allowing pharmacy to be a part of this patient's care.  Maryanna Shape, PharmD, BCPS   Clinical Pharmacist  Pager: [phone]   08/02/2016 1:34 PM

## 2016-08-02 NOTE — Progress Notes (Signed)
[facility] PHYSICAL MEDICINE & REHABILITATION     PROGRESS NOTE    Subjective/Complaints: Handled pain well. Feels that pain meds are working for him. Brace is major barrier especially with toileting/hygiene  ROS: Pt denies fever, rash/itching, headache, blurred or double vision, nausea, vomiting, abdominal pain, diarrhea, chest pain, shortness of breath, palpitations, dysuria, dizziness,   bleeding, anxiety, or depression     Objective: Vital Signs: Blood pressure 116/78, pulse 68, temperature 98.1 F (36.7 C), temperature source Oral, resp. rate 18, weight 88 kg (194 lb 0.1 oz), SpO2 99 %. No results found.  Recent Labs  08/01/16 0438 08/02/16 0610  WBC 4.9 5.1  HGB 8.2* 8.7*  HCT 26.2* 28.1*  PLT 204 229    Recent Labs  07/31/16 0515 08/01/16 0438  NA 142 140  K 3.5 3.3*  CL 110 108  GLUCOSE 94 105*  BUN 8 8  CREATININE 0.92 0.87  CALCIUM 8.2* 7.9*   CBG (last 3)  No results for input(s): GLUCAP in the last 72 hours.  Wt Readings from Last 3 Encounters:  08/01/16 88 kg (194 lb 0.1 oz)  07/31/16 88 kg (193 lb 14.4 oz)  07/20/16 88.5 kg (195 lb)    Physical Exam:  Constitutional: no distress  HENT:  Head: Normocephalic and atraumatic.  Eyes: EOMI Neck: Normal range of motion. Neck supple.  Cardiovascular: RRR   Respiratory: clear GI: NT  Musculoskeletal: trace bilateral LE edema Neurological: He is alert and oriented to person, place, and time.  Motor: 4+/5 throughout, UE, DTR's brisk LE: 3+ HF, 4 KE and 4+ ADF/PF. Sensation intact to light touch throughout all 4 limbs Skin: surgical incision clean and intact with steristrips   -macular rash on glutes/perianal area/groin Psychiatric: pleasant and appropriate Assessment/Plan: 1. Functional and mobility deficits  secondary to lumbar discitis with radiculopathy, s/p decompression and fusion which require 3+ hours per day of interdisciplinary therapy in a comprehensive inpatient rehab  setting. Physiatrist is providing close team supervision and 24 hour management of active medical problems listed below. Physiatrist and rehab team continue to assess barriers to discharge/monitor patient progress toward functional and medical goals.  Function:  Bathing Bathing position   Position: Bed  Bathing parts Body parts bathed by patient: Right arm, Left arm, Chest, Abdomen, Front perineal area, Buttocks, Right upper leg, Left upper leg Body parts bathed by helper: Right lower leg, Left lower leg, Back  Bathing assist Assist Level: Touching or steadying assistance(Pt > 75%)      Upper Body Dressing/Undressing Upper body dressing   What is the patient wearing?: Hospital gown, Orthosis             Orthosis activity level: Performed by helper  Upper body assist Assist Level: Set up   Set up : To obtain clothing/put away, To apply TLSO, cervical collar  Lower Body Dressing/Undressing Lower body dressing   What is the patient wearing?: Non-skid slipper socks         Non-skid slipper socks- Performed by patient: Don/doff right sock Non-skid slipper socks- Performed by helper: Don/doff left sock                  Lower body assist        Toileting Toileting   Toileting steps completed by patient: Adjust clothing prior to toileting, Performs perineal hygiene, Adjust clothing after toileting   Toileting Assistive Devices: Grab bar or rail  Toileting assist Assist level: More than reasonable time, Touching or steadying assistance (Pt.75%)  Transfers Chair/bed transfer     Chair/bed transfer assist level: Touching or steadying assistance (Pt > 75%) Chair/bed transfer assistive device: Medical sales representative     Max distance: 200 Assist level: Touching or steadying assistance (Pt > 75%)   Wheelchair          Cognition Comprehension Comprehension assist level: Follows complex conversation/direction with no assist  Expression Expression  assist level: Expresses complex ideas: With no assist  Social Interaction Social Interaction assist level: Interacts appropriately with others - No medications needed.  Problem Solving Problem solving assist level: Solves complex problems: Recognizes & self-corrects  Memory Memory assist level: Complete Independence: No helper   Medical Problem List and Plan: 1.  Weakness, gait abnormality, balance deficits secondary to Lumbar discitis with radiculopathy.  -continue CIR therapies 2.  DVT Prophylaxis/Anticoagulation: SCD 3. Pain Management: Improved on OxyContin 20 mg bid and prn oxycodone.   -continue with this regimen for now--a[ppears to be effective   4. Mood: Team to provide ego support to help with adjustment reaction. LCSW to follow for evaluation and support.  5. Neuropsych: This patient is capable of making decisions on his own behalf. 6. Skin/Wound Care: Monitor wound daily for healing. Maintain adequate nutritional and hydration status. Pressure relief from brace  -micro-guard powder for buttock rash 7. Fluids/Electrolytes/Nutrition: good po  -repleting potassium 8. Diskitis with osteomyelitis: Vancomycin/Ceftazidime D # 7/ 56+             continue to monitor levels/ follow renal status 9.  BPH: Voiding well.  10. ABLA: hgb up to 8.7 today  -Fe+ supp,  -re-check next week 11. Constipation: managed with senna and miralax on board. LOS (Days) 2 A FACE TO FACE EVALUATION WAS PERFORMED  Nitara Szczerba T 08/02/2016 8:59 AM

## 2016-08-02 NOTE — Care Management Note (Signed)
[facility] Individual Statement of Services  Patient Name:  Donald [name]  Date:  08/02/2016  Welcome to the [facility].  Our goal is to provide you with an individualized program based on your diagnosis and situation, designed to meet your specific needs.  With this comprehensive rehabilitation program, you will be expected to participate in at least 3 hours of rehabilitation therapies Monday-Friday, with modified therapy programming on the weekends.  Your rehabilitation program will include the following services:  Physical Therapy (PT), Occupational Therapy (OT), 24 hour per day rehabilitation nursing, Case Management (Social Worker), Rehabilitation Medicine, Nutrition Services and Pharmacy Services  Weekly team conferences will be held on Tuesdays to discuss your progress.  Your Social Worker will talk with you frequently to get your input and to update you on team discussions.  Team conferences with you and your family in attendance may also be held.  Expected length of stay: 7-9 days  Overall anticipated outcome: modified independent  Depending on your progress and recovery, your program may change. Your Social Worker will coordinate services and will keep you informed of any changes. Your Social Worker's name and contact numbers are listed  below.  The following services may also be recommended but are not provided by the [facility] will be made to provide these services after discharge if needed.  Arrangements include referral to agencies that provide these services.  Your insurance has been verified to be:  [employer] Your primary doctor is:  Dr. Carlota Raspberry  Pertinent information will be shared with your doctor and your insurance company.  Social Worker:  [place], [place] or ([phone]    Information discussed with and copy given to patient by: Lennart Pall, 08/02/2016, 10:03 AM

## 2016-08-02 NOTE — Progress Notes (Signed)
Occupational Therapy Session Note  Patient Details  Name: Donald [name] MRN: [id number] Date of Birth: Oct 09, 1940  Today's Date: 08/02/2016 OT Individual Time: 1033-1202 OT Individual Time Calculation (min): 89 min    Short Term Goals: Week 1:  OT Short Term Goal 1 (Week 1): STGs=LTGs secondary to short LOS  Skilled Therapeutic Interventions/Progress Updates:   Skilled OT session completed with focus on ADL retraining and use of AE. Pt was seated in recliner with back orthosis at time of arrival with spouse [name] present. 3/3 recall of back precautions. Pt was agreeable to get dressed. Min A transfer to bed with RW. Logroll completed for sit<supine with instruction on technique for adherence to precautions. Back brace doffed with Max A with pillow between pts legs for rolling. UB/LB dresing completed with HOB slightly elevated and AE training with cues for maintaining above 90 degrees of hip flexion. Min A overall. Back orthosis donned again with extensive collaboration regarding best technique for pt. He reported wanting "landmarks" on where to position certain aspects of brace in order to direct caregivers in donning. He reported having a bowel accident due to not donning brace in time with nursing, and pt-therapist discussed methods for avoiding occurrence in future.  Pt reported feeling much better about caregiver direction after practice and discussion during session. Toileting then completed with Min A and RW. Pt returned to recliner at end of session with back orthosis. All needs within reach.   Therapy Documentation Precautions:  Precautions Precautions: Back Precaution Comments: Reviewed back precautions during tx. Required Braces or Orthoses: Spinal Brace Spinal Brace: Thoracolumbosacral orthotic Spinal Brace Comments: with LE attatchment Restrictions Weight Bearing Restrictions: No   Pain: No c/o pain during session    ADL:   See Function Navigator for Current Functional  Status.   Therapy/Group: Individual Therapy  Dashanna Kinnamon A Belvia Gotschall 08/02/2016, 12:53 PM

## 2016-08-02 NOTE — Progress Notes (Signed)
Social Work Patient ID: Donald [name], male   DOB: 1941/02/07, 75 y.o.   MRN: [id number]   Have reviewed team conference with pt, son and wife.  All aware and agreeable with targeted d/c date of 11/28 with supervision/ mod ind goals overall.  Pt very pleased with progress.  They are aware that he will d/c home with IV abx.  Continue to follow.  Chanie Soucek, LCSW

## 2016-08-03 ENCOUNTER — Inpatient Hospital Stay (HOSPITAL_COMMUNITY): Payer: Medicare Other | Admitting: Physical Therapy

## 2016-08-03 ENCOUNTER — Inpatient Hospital Stay (HOSPITAL_COMMUNITY): Payer: Medicare Other

## 2016-08-03 DIAGNOSIS — G8918 Other acute postprocedural pain: Secondary | ICD-10-CM

## 2016-08-03 LAB — VANCOMYCIN, TROUGH: VANCOMYCIN TR: 21 ug/mL — AB (ref 15–20)

## 2016-08-03 NOTE — Progress Notes (Addendum)
Donald [name] PHYSICAL MEDICINE & REHABILITATION     PROGRESS NOTE    Subjective/Complaints: Pt laying in bed this AM.  He slept well overnight and is doing well this AM.  He is looking forward to dinner with family.    ROS: Denies CP, SOB, N/V/D.   Objective: Vital Signs: Blood pressure 131/75, pulse 62, temperature 98.2 F (36.8 C), temperature source Oral, resp. rate 18, weight 88.2 kg (194 lb 5.4 oz), SpO2 97 %. No results found.  Recent Labs  08/01/16 0438 08/02/16 0610  WBC 4.9 5.1  HGB 8.2* 8.7*  HCT 26.2* 28.1*  PLT 204 229    Recent Labs  08/01/16 0438  NA 140  K 3.3*  CL 108  GLUCOSE 105*  BUN 8  CREATININE 0.87  CALCIUM 7.9*   CBG (last 3)  No results for input(s): GLUCAP in the last 72 hours.  Wt Readings from Last 3 Encounters:  08/02/16 88.2 kg (194 lb 5.4 oz)  07/31/16 88 kg (193 lb 14.4 oz)  07/20/16 88.5 kg (195 lb)    Physical Exam:  Constitutional: no distress. Vital signs reviewed. HENT: Normocephalic and atraumatic.  Eyes: EOMI. No discharge.  Cardiovascular: RRR. No JVD. Respiratory: clear. Unlabored GI: NT. Soft Musculoskeletal: trace bilateral LE edema Neurological: He is alert and oriented.  Motor: B/l UE 4+/5 B/l LE: 4/5 HF, KE and 4+/5 ADF/PF. Skin: surgical incision clean and intact with steristrips  Psychiatric: pleasant and appropriate  Assessment/Plan: 1. Functional and mobility deficits  secondary to lumbar discitis with radiculopathy, s/p decompression and fusion which require 3+ hours per day of interdisciplinary therapy in a comprehensive inpatient rehab setting. Physiatrist is providing close team supervision and 24 hour management of active medical problems listed below. Physiatrist and rehab team continue to assess barriers to discharge/monitor patient progress toward functional and medical goals.  Function:  Bathing Bathing position   Position: Bed  Bathing parts Body parts bathed by patient: Right arm, Left  arm, Chest, Abdomen, Front perineal area, Buttocks, Right upper leg, Left upper leg Body parts bathed by helper: Right lower leg, Left lower leg, Back  Bathing assist Assist Level: Touching or steadying assistance(Pt > 75%)      Upper Body Dressing/Undressing Upper body dressing   What is the patient wearing?: Pull over shirt/dress, Orthosis     Pull over shirt/dress - Perfomed by patient: Thread/unthread right sleeve, Thread/unthread left sleeve, Put head through opening, Pull shirt over trunk       Orthosis activity level: Performed by helper  Upper body assist Assist Level: Supervision or verbal cues   Set up : To obtain clothing/put away, To apply TLSO, cervical collar  Lower Body Dressing/Undressing Lower body dressing   What is the patient wearing?: Pants, Socks, Shoes     Pants- Performed by patient: Thread/unthread right pants leg, Thread/unthread left pants leg, Pull pants up/down   Non-skid slipper socks- Performed by patient: Don/doff right sock Non-skid slipper socks- Performed by helper: Don/doff left sock Socks - Performed by patient: Don/doff right sock, Don/doff left sock   Shoes - Performed by patient: Don/doff right shoe, Don/doff left shoe Shoes - Performed by helper: Fasten right, Fasten left          Lower body assist Assist for lower body dressing: Touching or steadying assistance (Pt > 75%)      Toileting Toileting   Toileting steps completed by patient: Adjust clothing prior to toileting, Performs perineal hygiene, Adjust clothing after toileting   Toileting  Assistive Devices: Grab bar or rail  Toileting assist Assist level: Touching or steadying assistance (Pt.75%)   Transfers Chair/bed transfer   Chair/bed transfer method: Stand pivot Chair/bed transfer assist level: Touching or steadying assistance (Pt > 75%) Chair/bed transfer assistive device: Walker, Air cabin crew     Max distance: 150 ft Assist level: Touching  or steadying assistance (Pt > 75%)   Wheelchair          Cognition Comprehension Comprehension assist level: Follows complex conversation/direction with no assist  Expression Expression assist level: Expresses complex ideas: With no assist  Social Interaction Social Interaction assist level: Interacts appropriately with others - No medications needed.  Problem Solving Problem solving assist level: Solves complex problems: Recognizes & self-corrects  Memory Memory assist level: Complete Independence: No helper   Medical Problem List and Plan: 1.  Weakness, gait abnormality, balance deficits secondary to Lumbar discitis with radiculopathy.  -continue CIR therapies, on hold today for holiday 2.  DVT Prophylaxis/Anticoagulation: SCD 3. Pain Management: Improved on OxyContin 20 mg bid and prn oxycodone.   -continue with this regimen for now--a[ppears to be effective 4. Mood: Team to provide ego support to help with adjustment reaction. LCSW to follow for evaluation and support.  5. Neuropsych: This patient is capable of making decisions on his own behalf. 6. Skin/Wound Care: Monitor wound daily for healing. Maintain adequate nutritional and hydration status. Pressure relief from brace  -micro-guard powder for buttock rash 7. Fluids/Electrolytes/Nutrition: good po  -repleting potassium 8. Diskitis with osteomyelitis: Vancomycin/Ceftazidime D # 9/ 56+             continue to monitor levels/ follow renal status 9.  BPH: Voiding well.  10. ABLA:   hgb up to 8.7 on 11/22  Cont to monitor  -Fe+ supp, 11. Constipation: managed with senna and miralax on board.  LOS (Days) 3 A FACE TO FACE EVALUATION WAS PERFORMED  Donald [name] 08/03/2016 8:35 AM

## 2016-08-03 NOTE — IPOC Note (Signed)
Overall Plan of Care [facility]) Patient Details Name: Donald [name] MRN: [id number] DOB: [birth year]  Admitting Diagnosis: [name] Problems: Principal Problem:   Paraparesis of both lower limbs ([place]) Active Problems:   Discitis of lumbar region   Abscess in epidural space of lumbar spine   Status post lumbar laminectomy   Acute blood loss anemia   Lumbar radiculopathy   Post-operative pain     Functional Problem List: Nursing Pain, Perception, Safety, Skin Integrity, Endurance, Motor, Edema  PT Balance, Endurance, Motor, Pain, Safety  OT Balance, Endurance, Motor, Pain, Safety  SLP    TR         Basic ADL's: OT Grooming, Bathing, Dressing, Toileting     Advanced  ADL's: OT       Transfers: PT Bed Mobility, Bed to Chair, Car, Chief Operating Officer: PT Ambulation, Stairs     Additional Impairments: OT None  SLP        TR      Anticipated Outcomes Item Anticipated Outcome  Self Feeding n/a  Swallowing      Basic self-care  mod I - min A  Toileting  min A   Bathroom Transfers mod I   Bowel/Bladder  Remain continent of bowel and bladder  Transfers  mod I  Locomotion  mod I  Communication     Cognition     Pain  Pain at or below 3  Safety/Judgment  No falls    Therapy Plan: PT Intensity: Minimum of 1-2 x/day ,45 to 90 minutes PT Frequency: 5 out of 7 days PT Duration Estimated Length of Stay: 7 days OT Intensity: Minimum of 1-2 x/day, 45 to 90 minutes OT Frequency: 5 out of 7 days OT Duration/Estimated Length of Stay: 7 days         Team Interventions: Nursing Interventions Patient/Family Education, Disease Management/Prevention, Skin Care/Wound Management, Discharge Planning, Psychosocial Support, Pain Management, Medication Management  PT interventions Ambulation/gait training, Training and development officer, Community reintegration, Discharge planning, DME/adaptive equipment instruction, Functional mobility  training, Pain management, Splinting/orthotics, Therapeutic Activities, UE/LE Strength taining/ROM, UE/LE Coordination activities, Therapeutic Exercise, Stair training, Patient/family education, Neuromuscular re-education, Functional electrical stimulation, Wheelchair propulsion/positioning  OT Interventions Training and development officer, Academic librarian, Discharge planning, Self Care/advanced ADL retraining, Therapeutic Exercise, Wheelchair propulsion/positioning, DME/adaptive equipment instruction, Pain management, UE/LE Strength taining/ROM, Patient/family education, Splinting/orthotics, UE/LE Coordination activities, Therapeutic Activities, Psychosocial support, Functional mobility training  SLP Interventions    TR Interventions    SW/CM Interventions Discharge Planning, Psychosocial Support, Patient/Family Education    Team Discharge Planning: Destination: PT-Home ,OT- Home , SLP-  Projected Follow-up: PT-Outpatient PT, OT-  24 hour supervision/assistance, SLP-  Projected Equipment Needs: PT-To be determined, OT- To be determined, SLP-  Equipment Details: PT-pt has BSC, RW, OT-  Patient/family involved in discharge planning: PT- Patient, Family member/caregiver,  OT-Patient, Family member/caregiver, SLP-   MD ELOS: 5-7 days. Medical Rehab Prognosis:  Excellent Assessment:  75 y.o.malewith history of prostate cancer, lumbar stenosis s/p L3-L5 decompression 7/20/17who started developing BLE pain with spasms and was found to have L4-L5 osteomyelitis with epidural abscess. He was admitted for I &D of lumbar wound on 10/19/17and discharged to home with plans for IV Vanc/Ceftriaxone to continue thorough 08/23/16. He was readmitted with 11/9/17with increase in back pain with radiculopathyand decline in mobility. Follow up MRI showed recurrence of abscess and progressive discitis and osteomyelitis with destruction of L4 and L5 vertebral body. Dr. Rolena Infante consulted for second opinion and  concurred with recommendations of surgical intervention. Patient underwent revision of L4/5 decompression with I and D of infected material and posterior fusion L3-S1. He has received  2 units PRBC for ABLA and pain control/muscle spasms are improving. Dr. Linus Salmons recommends continuing vancomycin/ceftazidime for 8 weeks at least and follow up with RICD in 4 weeks.  TLSO ordered for support and functional deficits with mobility as well as ability to carry out ADLs. Will set goals for Mod I with PT/OT.  See Team Conference Notes for weekly updates to the plan of care

## 2016-08-04 ENCOUNTER — Inpatient Hospital Stay (HOSPITAL_COMMUNITY): Payer: Medicare Other | Admitting: Occupational Therapy

## 2016-08-04 ENCOUNTER — Inpatient Hospital Stay (HOSPITAL_COMMUNITY): Payer: Medicare Other

## 2016-08-04 ENCOUNTER — Inpatient Hospital Stay (HOSPITAL_COMMUNITY): Payer: Medicare Other | Admitting: Physical Therapy

## 2016-08-04 LAB — BASIC METABOLIC PANEL
Anion gap: 7 (ref 5–15)
BUN: 9 mg/dL (ref 6–20)
CHLORIDE: 109 mmol/L (ref 101–111)
CO2: 26 mmol/L (ref 22–32)
CREATININE: 0.95 mg/dL (ref 0.61–1.24)
Calcium: 8.4 mg/dL — ABNORMAL LOW (ref 8.9–10.3)
GFR calc non Af Amer: >60 mL/min (ref >60)
Glucose, Bld: 92 mg/dL (ref 65–99)
POTASSIUM: 3.6 mmol/L (ref 3.5–5.1)
SODIUM: 142 mmol/L (ref 135–145)

## 2016-08-04 MED ORDER — OXYCODONE HCL ER 10 MG PO T12A
10.0000 mg | EXTENDED_RELEASE_TABLET | Freq: Two times a day (BID) | ORAL | Status: DC
  Administered 2016-08-04 – 2016-08-05 (×3): 10 mg via ORAL
  Filled 2016-08-04 (×3): qty 1

## 2016-08-04 NOTE — Progress Notes (Signed)
Donald PHYSICAL MEDICINE & REHABILITATION     PROGRESS NOTE    Subjective/Complaints: Pt sitting up in bed this AM.  He slept well overnight and had a delicious meal yesterday.    ROS: Denies [name], Donald [name], Donald [name], Donald [name], Donald 98.1 F (36.7 C), Donald [name], Donald [name], Donald [name] 88.2 kg (194 lb 5.4 oz), SpO2 95 %. No results found.  Recent Labs  08/02/16 0610  WBC 5.1  HGB 8.7*  HCT 28.1*  PLT 229    Recent Labs  08/04/16 0500  NA 142  K 3.6  CL 109  GLUCOSE 92  BUN 9  CREATININE 0.95  CALCIUM 8.4*   CBG (last 3)  No results for input(s): GLUCAP in the last 72 hours.  Wt Readings from Last 3 Encounters:  08/02/16 88.2 kg (194 lb 5.4 oz)  11/[name]/17 88 kg (193 lb 14.4 oz)  07/20/16 88.5 kg (195 lb)    Physical Exam:  Constitutional: no distress. Vital signs reviewed. HENT: Normocephalic and atraumatic.  Eyes: EOMI. No discharge.  Cardiovascular: RRR. No JVD. Respiratory: clear. Unlabored GI: NT. Soft Musculoskeletal: trace bilateral LE edema Neurological: He is alert and oriented.  Motor: 4+/5 throughout Skin: surgical incision clean and intact with steristrips  Psychiatric: pleasant and appropriate  Assessment/Plan: 1. Functional and mobility deficits  secondary to lumbar discitis with radiculopathy, s/p decompression and fusion which require 3+ hours per day of interdisciplinary therapy in a comprehensive inpatient rehab setting. Physiatrist is providing close team supervision and 24 hour management of active medical problems listed below. Physiatrist and rehab team continue to assess barriers to discharge/monitor patient progress toward functional and medical goals.  Function:  Bathing Bathing position   Position: Bed  Bathing parts Body parts bathed by patient: Right arm, Left arm, Chest, Abdomen, Front perineal area, Buttocks, Right upper leg, Left upper leg Body parts bathed  by helper: Right lower leg, Left lower leg, Back  Bathing assist Assist Level: Touching or steadying assistance(Pt > 75%)      Upper Body Dressing/Undressing Upper body dressing   What is the patient wearing?: Pull over shirt/dress, Orthosis     Pull over shirt/dress - Perfomed by patient: Thread/unthread right sleeve, Thread/unthread left sleeve, Put head through opening, Pull shirt over trunk       Orthosis activity level: Performed by helper  Upper body assist Assist Level: Supervision or verbal cues   Set up : To obtain clothing/put away, To apply TLSO, cervical collar  Lower Body Dressing/Undressing Lower body dressing   What is the patient wearing?: Pants, Socks, Shoes     Pants- Performed by patient: Thread/unthread right pants leg, Thread/unthread left pants leg, Pull pants up/down   Non-skid slipper socks- Performed by patient: Don/doff right sock Non-skid slipper socks- Performed by helper: Don/doff left sock Socks - Performed by patient: Don/doff right sock, Don/doff left sock   Shoes - Performed by patient: Don/doff right shoe, Don/doff left shoe Shoes - Performed by helper: Fasten right, Fasten left          Lower body assist Assist for lower body dressing: Touching or steadying assistance (Pt > 75%)      Toileting Toileting   Toileting steps completed by patient: Adjust clothing prior to toileting, Performs perineal hygiene, Adjust clothing after toileting   Toileting Assistive Devices: Grab bar or rail  Toileting assist Assist level: Touching or steadying assistance (Pt.75%)   Transfers Chair/bed transfer  Chair/bed transfer method: Stand pivot Chair/bed transfer assist level: Touching or steadying assistance (Pt > 75%) Chair/bed transfer assistive device: Walker, Air cabin crew     Max distance: 150 ft Assist level: Touching or steadying assistance (Pt > 75%)   Wheelchair          Cognition Comprehension Comprehension  assist level: Follows complex conversation/direction with no assist  Expression Expression assist level: Expresses complex ideas: With no assist  Social Interaction Social Interaction assist level: Interacts appropriately with others - No medications needed.  Problem Solving Problem solving assist level: Solves complex problems: Recognizes & self-corrects  Memory Memory assist level: Complete Independence: No helper   Medical Problem List and Plan: 1.  Weakness, gait abnormality, balance deficits secondary to Lumbar discitis with radiculopathy.  -continue CIR therapies 2.  DVT Prophylaxis/Anticoagulation: SCD 3. Pain Management:   OxyContin [name] mg bid, reduced to 10mg  on 11/24  Prn oxycodone.   Continue with this regimen for now--appears to be effective 4. Mood: Team to provide ego support to help with adjustment reaction. LCSW to follow for evaluation and support.  5. Neuropsych: This patient is capable of making decisions on his own behalf. 6. Skin/Wound Care: Monitor wound daily for healing. Maintain adequate nutritional and hydration status. Pressure relief from brace  -micro-guard powder for buttock rash 7. Fluids/Electrolytes/Nutrition: good po  -repleting potassium, K+ 3.6 on 11/24 8. Diskitis with osteomyelitis: Vancomycin/Ceftazidime D # 10/ 56+             continue to monitor levels/ follow renal status 9.  BPH: Voiding well.  10. ABLA:   hgb up to 8.7 on 11/22  Cont to monitor  -Fe+ supp, 11. Constipation: managed with senna and miralax on board.  LOS (Days) 4 A FACE TO FACE EVALUATION WAS PERFORMED  Ankit Lorie Phenix 08/04/2016 8:33 AM

## 2016-08-04 NOTE — Progress Notes (Signed)
Occupational Therapy Session Note  Patient Details  Name: Donald [name] MRN: [id number] Date of Birth: 09/13/1940  Today's Date: 08/04/2016 OT Individual Time: 1535-1630 OT Individual Time Calculation (min): 55 min    Skilled Therapeutic Interventions/Progress Updates:     Patient in recliner watching basketball game with his son upon approach for therapy.    He concurred to participate as follows:  Donning shoes and socks via adaptive equiprment education (long handled shoe horn, sockaide reacher).   Patient required moderate assistance and stated the left LE brace made it difficult to bend his right knee and therefore diffcult to don shoe.   He became frustrated while donning shoe and stated, "I am going to tell you this is a waste of my time because I am not going to do this at home.    My wife will do it."    When this clinician asked him if he would like to keep trying in order to become more independent and to give his wife a break, he stated, "No" to both questions.    His son confirmed that [name] will do most of patient's lower body dressing and cleansing of buttocks for him.  Patient completed toilet transfer via RW with supervision and toileting (clothing mgmt cleansing after voiding, etc) with distant supervision  Patient left in the care of his RN and son at the session's end.  Therapy Documentation Precautions:  Precautions Precautions: Back Precaution Comments: Reviewed back precautions during tx. Required Braces or Orthoses: Spinal Brace Spinal Brace: Thoracolumbosacral orthotic Spinal Brace Comments: with LE attatchment Restrictions Weight Bearing Restrictions: No  Pain:denied    See Function Navigator for Current Functional Status.   Therapy/Group: Individual Therapy  Alfredia Ferguson [facility] 08/04/2016, 5:56 PM

## 2016-08-04 NOTE — Progress Notes (Signed)
Physical Therapy Session Note  Patient Details  Name: Donald [name] MRN: [id number] Date of Birth: [birth year]  Today's Date: 08/04/2016 PT Concurrent Time: [id number] PT Concurrent Time Calculation (min): 72 min   Short Term Goals: Week 1:   = LTG  Skilled Therapeutic Interventions/Progress Updates:  Pt received in recliner with son present; no c/o pain.  Pt performed sit > stand from recliner with min A and performed ambulation on unit with RW and min A for balance.  In gym performed stair negotiation training up/down 4 stairs x 2-3 reps initially with bilat UE support on rails and progressing to laterally with bilat UE supported on L rail performing step to sequencing leading with RLE to ascend, LLE to descend due to TLSO and hip flexion lock with mod A due to LE weakness and pt pulling self up each step 80% with UE.  Performed LE strengthening, weight shifting and balance training during 2 sets x 12 reps repetitive step ups forwards and laterally with RLE on 3" step to decrease UE dependence.  Performed car transfer training with RW in/out of low car with min-mod A.  Returned to room ambulating with RW and min A and pt returned to recliner to rest; throughout session pt required min A during stand > sit for eccentric control.  Therapy Documentation Precautions:  Precautions Precautions: Back Precaution Comments: Reviewed back precautions during tx. Required Braces or Orthoses: Spinal Brace Spinal Brace: Thoracolumbosacral orthotic Spinal Brace Comments: with LE attatchment Restrictions Weight Bearing Restrictions: No Vital Signs: Therapy Vitals Temp: 98.3 F (36.8 C) Temp Source: Oral Pulse Rate: 60 Resp: 18 BP: (!) 107/47 Patient Position (if appropriate): Sitting Oxygen Therapy SpO2: 97 % O2 Device: Not Delivered   See Function Navigator for Current Functional Status.   Therapy/Group: Individual Therapy  Raylene Everts [facility] 08/04/2016, 4:16 PM

## 2016-08-04 NOTE — Progress Notes (Signed)
Pharmacy Antibiotic Note 75 y.o.males/p L3/L4, L4/L5 decompression in July, hospital admission (10/19-23/17) for osteomyelitis of lumbar spine with epidural abscess, and started a 8 week course of vanc/rocephin, readmitted on 11/9 with worsening abscess, s/p but unfortunately developed worsening abscess, s/p decompression I&D 11/14. ID changed antibiotics to vancomycin/ceftazidime, plan to treat for 8 weeks from surgery.  Renal function has been stable, scr 0.95, good UOP. Vancomycin trough (11/23) slightly high (21) on 1g IV q12h. Will keep dose for now and monitor closely for accumulation.  Plan: Continue vancomycin 1 gm IV q12h with trough goal 15- 20 mcg/ml  Continue ceftazidime 2g/8h Consider checking vancomycin trough in ~3 days with level trending up BMET Q 72 hrs   Weight: 194 lb 5.4 oz (88.2 kg)  Temp (24hrs), Avg:98.1 F (36.7 C), Min:98 F (36.7 C), Max:98.1 F (36.7 C)   Recent Labs Lab 07/29/16 0830 07/31/16 0515 08/01/16 0438 08/02/16 0610 08/03/16 1201 08/04/16 0500  WBC 6.2  --  4.9 5.1  --   --   CREATININE 0.85 0.92 0.87  --   --  0.95  VANCOTROUGH  --   --   --   --  21*  --     Estimated Creatinine Clearance: 76 mL/min (by C-G formula based on SCr of 0.95 mg/dL).    Allergies  Allergen Reactions  . Iodine Anaphylaxis  . Ivp Dye [Iodinated Diagnostic Agents] Anaphylaxis    Antimicrobials this admission:  Vancomycin 10/19 >> Ceftriaxone 10/20 >> 10/21, 10/23 >> 11/10 Ceftazidime 11/10>>  Fluconazole 11/10  Dose adjustments this admission:  10/23 VT 12 on 750mg  q12, increase to 1g q12 11/10 VT 18 on 1g q12 - cont 11/16 VT 19 on 1 gm q12 ( VT drawn 1 hr after dose due)  VT 11/23 = 21 on 1g IV every 12h- Leave for now; Renal function has been stable and levels have been relatively stable at this dose   Microbiology results:  10/19 Lumbar wound: Propionibacterium acnes 11/12 MRSA PCR: negative  11/14 tissue intervertebral disc: gram stain: few  GNR, few GVR, mod GPcocc in pairs, few GRP No growth x 4 days, AFB neg 11/14 swab of lumbar epidural: gram stain: mod GPcocci in pairs, rare GNRs, no growth 4 days   Elicia Lamp, PharmD, BCPS Clinical Pharmacist 08/04/2016 12:46 PM

## 2016-08-04 NOTE — Progress Notes (Signed)
Physical Therapy Session Note  Patient Details  Name: Donald [name] MRN: [id number] Date of Birth: Oct 02, 1940  Today's Date: 08/04/2016 PT Individual Time: [id number] PT Individual Time Calculation (min): 26 min    Skilled Therapeutic Interventions/Progress Updates:    Session focused on transfers without use of armrests to simulate home environment, d/c planning and education with pt and pt's wife in regards to home set-up and community reintegration, and gait training with RW and orthosis (TLSO) with focus on endurance, ramp negotiation, and balance. Pt completed transfers from various surfaces including chair without armrests with min assist and cues for technique. Gait at supervision to steadying assist level with RW demonstrating improved overall endurance and adaptability to limitations of TLSO.   Therapy Documentation Precautions:  Precautions Precautions: Back Precaution Comments: Reviewed back precautions during tx. Required Braces or Orthoses: Spinal Brace Spinal Brace: Thoracolumbosacral orthotic Spinal Brace Comments: with LE attatchment Restrictions Weight Bearing Restrictions: No   Pain: No complaints.    See Function Navigator for Current Functional Status.   Therapy/Group: Individual Therapy  Canary Brim Ivory Broad, PT, DPT  08/04/2016, 12:32 PM

## 2016-08-04 NOTE — Progress Notes (Signed)
Occupational Therapy Session Note  Patient Details  Name: Donald [name] MRN: [id number] Date of Birth: 21-May-1941  Today's Date: 08/04/2016 OT Individual Time: [id number] OT Individual Time Calculation (min): 59 min   Short Term Goals: Week 1:  OT Short Term Goal 1 (Week 1): STGs=LTGs secondary to short LOS  Skilled Therapeutic Interventions/Progress Updates:   Pt was lying in bed at time of arrival with spouse present, requesting to complete ADLs. Bathing/dressing completed with use of LH sponge and reacher bedlevel with overall Min A. Sores on bilateral heels observed with nursing notified and covered with pink foam during session. Back orthosis was donned by spouse with Min A and mod vcs on technique. Footwear donned at EOB with sock aide and min cues. Pt then ambulated with RW and Min A to recliner. LEs elevated due to c/o slight pain at end of tx. Spouse Donald [name] brought mustard packs (which reportedly help ease pts muscle pain). Nursing made aware of pts pain at time of departure as well. Pt was left with spouse at time of departure. Pt would benefit from working on toileting for next session due to c/o not being able to complete hygiene while adhering to precautions. Toilet modified today to lessen task demands. Will followup regarding its helpfulness.   Therapy Documentation Precautions:  Precautions Precautions: Back Precaution Comments: Reviewed back precautions during tx. Required Braces or Orthoses: Spinal Brace Spinal Brace: Thoracolumbosacral orthotic Spinal Brace Comments: with LE attatchment Restrictions Weight Bearing Restrictions: No General:   Vital Signs: Therapy Vitals Temp: 98.3 F (36.8 C) Temp Source: Oral Pulse Rate: 60 Resp: 18 BP: (!) 107/47 Patient Position (if appropriate): Sitting Oxygen Therapy SpO2: 97 % O2 Device: Not Delivered Pain: Min c/o pain at end of session. Addressed in methods written above.       See Function Navigator for Current  Functional Status.   Therapy/Group: Individual Therapy  [name] 08/04/2016, 3:34 PM

## 2016-08-05 ENCOUNTER — Inpatient Hospital Stay (HOSPITAL_COMMUNITY): Payer: Medicare Other | Admitting: Occupational Therapy

## 2016-08-05 ENCOUNTER — Inpatient Hospital Stay (HOSPITAL_COMMUNITY): Payer: Medicare Other | Admitting: Physical Therapy

## 2016-08-05 NOTE — Progress Notes (Signed)
Occupational Therapy Session Note  Patient Details  Name: Donald [name] MRN: [id number] Date of Birth: March 27, 1941  Today's Date: 08/05/2016 OT Individual Time: 1028-1202 OT Individual Time Calculation (min): 94 min    Short Term Goals: Week 1:  OT Short Term Goal 1 (Week 1): STGs=LTGs secondary to short LOS  Skilled Therapeutic Interventions/Progress Updates:   Pt was sitting up with family at time of arrival. Discussion completed regarding prior therapy session and having difficulties with donning footwear. Pt verbalized frustration with using shoe horns after pink foam was applied on heels. Pt-therapist collaboration completed regarding wearing gripper socks at home and having [name] assist with donning sneakers when needed. Pt verbalized agreement with this plan as he is still able to use sock aide with foot dressings. Afterwards extensive pt-therapist collaboration completed regarding toileting plan for home. Pt educated on use of toilet tongs, bidet, and post toileting shower-off options at time of discharge. Pt trialed use of toilet tongs and reported increased ease with use. Pt also provided extra LH sponge due to report of wanting to try to use that for toilet hygiene. Shaving then completed w/c level at sink with supervision. Pt education provided on having chair in bathroom for safety in home when shaving. Afterwards pt ambulated to gym. Tx focus on balance, weight shifting, and LE strengthening by removing one UE off of walker/handles.  Pt completed graded biodex activities, improving score from -17% to 60% with extra time. Pt exhibited challenges with removing UE support but was able to complete without LOB. Floor-clock stepping activity then completed with dynamic standing challenges and unilateral support on walker for decreasing reliance on UEs and strengthening LEs. R LE buckled x3 during activity with pt able to recover balance. Afterwards pt ambulated back to room with supervision  and was left in recliner with all needs within reach.   Therapy Documentation Precautions:  Precautions Precautions: Back Precaution Comments: Reviewed back precautions during tx. Required Braces or Orthoses: Spinal Brace Spinal Brace: Thoracolumbosacral orthotic Spinal Brace Comments: with LE attatchment Restrictions Weight Bearing Restrictions: No     Pain: No c/o pain during session       See Function Navigator for Current Functional Status.   Therapy/Group: Individual Therapy  [name] 08/05/2016, 12:26 PM

## 2016-08-05 NOTE — Progress Notes (Signed)
[facility] PHYSICAL MEDICINE & REHABILITATION     PROGRESS NOTE    Subjective/Complaints: Pt sitting up in bed this AM.  He slept well overnight.  He does not have any complaints this AM.    ROS: Denies CP, SOB, N/V/D.   Objective: Vital Signs: Blood pressure 124/62, pulse 61, temperature 98.3 F (36.8 C), temperature source Oral, resp. rate 17, weight 88.2 kg (194 lb 5.4 oz), SpO2 96 %. No results found. No results for input(s): WBC, HGB, HCT, PLT in the last 72 hours.  Recent Labs  08/04/16 0500  NA 142  K 3.6  CL 109  GLUCOSE 92  BUN 9  CREATININE 0.95  CALCIUM 8.4*   CBG (last 3)  No results for input(s): GLUCAP in the last 72 hours.  Wt Readings from Last 3 Encounters:  08/02/16 88.2 kg (194 lb 5.4 oz)  07/31/16 88 kg (193 lb 14.4 oz)  07/20/16 88.5 kg (195 lb)    Physical Exam:  Constitutional: NAD. Vital signs reviewed. HENT: Normocephalic and atraumatic.  Eyes: EOMI. No discharge.  Cardiovascular: RRR. No JVD. Respiratory: clear. Unlabored GI: NT. Soft Musculoskeletal: trace bilateral LE edema Neurological: He is alert and oriented.  Motor: 4+/5 throughout Skin: surgical incision clean and intact with steristrips  Psychiatric: pleasant and appropriate  Assessment/Plan: 1. Functional and mobility deficits  secondary to lumbar discitis with radiculopathy, s/p decompression and fusion which require 3+ hours per day of interdisciplinary therapy in a comprehensive inpatient rehab setting. Physiatrist is providing close team supervision and 24 hour management of active medical problems listed below. Physiatrist and rehab team continue to assess barriers to discharge/monitor patient progress toward functional and medical goals.  Function:  Bathing Bathing position   Position: Bed  Bathing parts Body parts bathed by patient: Right arm, Left arm, Chest, Abdomen, Front perineal area, Buttocks, Right upper leg, Left upper leg, Right lower leg, Left lower  leg Body parts bathed by helper: Back  Bathing assist Assist Level: Touching or steadying assistance(Pt > 75%)      Upper Body Dressing/Undressing Upper body dressing   What is the patient wearing?: Pull over shirt/dress     Pull over shirt/dress - Perfomed by patient: Thread/unthread right sleeve, Thread/unthread left sleeve, Put head through opening, Pull shirt over trunk       Orthosis activity level: Performed by helper  Upper body assist Assist Level: Supervision or verbal cues   Set up : To obtain clothing/put away, To apply TLSO, cervical collar  Lower Body Dressing/Undressing Lower body dressing   What is the patient wearing?: Pants, Non-skid slipper socks     Pants- Performed by patient: Thread/unthread right pants leg, Thread/unthread left pants leg, Pull pants up/down   Non-skid slipper socks- Performed by patient: Don/doff right sock, Don/doff left sock Non-skid slipper socks- Performed by helper: Don/doff left sock Socks - Performed by patient: Don/doff right sock, Don/doff left sock   Shoes - Performed by patient: Don/doff right shoe, Don/doff left shoe Shoes - Performed by helper: Fasten right, Fasten left          Lower body assist Assist for lower body dressing: Touching or steadying assistance (Pt > 75%)      Toileting Toileting   Toileting steps completed by patient: Adjust clothing prior to toileting, Performs perineal hygiene, Adjust clothing after toileting   Toileting Assistive Devices: Grab bar or rail  Toileting assist Assist level: Touching or steadying assistance (Pt.75%)   Transfers Chair/bed transfer   Chair/bed transfer  method: Ambulatory, Stand pivot Chair/bed transfer assist level: Touching or steadying assistance (Pt > 75%) Chair/bed transfer assistive device: Walker, Orthosis     Locomotion Ambulation     Max distance: 200' Assist level: Touching or steadying assistance (Pt > 75%)   Wheelchair           Cognition Comprehension Comprehension assist level: Follows complex conversation/direction with no assist  Expression Expression assist level: Expresses complex ideas: With no assist  Social Interaction Social Interaction assist level: Interacts appropriately with others - No medications needed.  Problem Solving Problem solving assist level: Solves complex problems: Recognizes & self-corrects  Memory Memory assist level: Complete Independence: No helper   Medical Problem List and Plan: 1.  Weakness, gait abnormality, balance deficits secondary to Lumbar discitis with radiculopathy.  -continue CIR therapies 2.  DVT Prophylaxis/Anticoagulation: SCD 3. Pain Management:   OxyContin 20 mg bid, reduced to 10mg  on 11/24, will cont to wean  Prn oxycodone.   Continue with this regimen for now--appears to be effective 4. Mood: Team to provide ego support to help with adjustment reaction. LCSW to follow for evaluation and support.  5. Neuropsych: This patient is capable of making decisions on his own behalf. 6. Skin/Wound Care: Monitor wound daily for healing. Maintain adequate nutritional and hydration status. Pressure relief from brace  -micro-guard powder for buttock rash 7. Fluids/Electrolytes/Nutrition: good po  -repleting potassium, K+ 3.6 on 11/24 8. Diskitis with osteomyelitis: Vancomycin/Ceftazidime D # 11/ 56+             continue to monitor levels/ follow renal status 9.  BPH: Voiding well.  10. ABLA:   hgb up to 8.7 on 11/22  Cont to monitor  -Fe+ supp, 11. Constipation: managed with senna and miralax on board.  LOS (Days) 5 A FACE TO FACE EVALUATION WAS PERFORMED  Marilou Barnfield Lorie Phenix 08/05/2016 8:02 AM

## 2016-08-05 NOTE — Progress Notes (Signed)
Physical Therapy Session Note  Patient Details  Name: Donald [name] MRN: [id number] Date of Birth: 1941/05/03  Today's Date: 08/05/2016 PT Individual Time: 0803-0859 PT Individual Time Calculation (min): 56 min    Short Term Goals: Week 1:  PT Short Term Goal 1 (Week 1): = LTG of Mod I  Skilled Therapeutic Interventions/Progress Updates:  Pt received in recliner with wife present; assisted pt with re-positioning L thigh piece of TLSO.  Pt performed sit > stand from recliner and performed ambulation with RW and supervision in controlled environment.  Discussed bedroom set up at home; pt reports they have a hospital bed in their bedroom he plans to use for a while but would like to try regular bed mobility.  Performed bed mobility on flat bed with TLSO donned with log rolling and supervision with UE supported on RW to help push up.  Performed bilat LE strengthening on Kinetron at 30 cm/sec in sitting x 1 minute at a time x 3 reps.  Performed OTAGO standing and sitting HEP minus squats for bilat LE strengthening and hip stabilization training.  Reviewed lateral stair negotiation with L rail with wife observing and therapist providing min A.  Returned to room with RW and pt left in recliner with wife present and all items within reach.   Therapy Documentation Precautions:  Precautions Precautions: Back Precaution Comments: Reviewed back precautions during tx. Required Braces or Orthoses: Spinal Brace Spinal Brace: Thoracolumbosacral orthotic Spinal Brace Comments: with LE attatchment Restrictions Weight Bearing Restrictions: No Vital Signs: Therapy Vitals Temp: 98.3 F (36.8 C) Temp Source: Oral Pulse Rate: 61 Resp: 17 BP: 124/62 Patient Position (if appropriate): Lying Oxygen Therapy SpO2: 96 % O2 Device: Not Delivered Pain:  0/10 but does report some L lower leg cramping; requested mustard.     See Function Navigator for Current Functional Status.   Therapy/Group:  Individual Therapy  Raylene Everts [facility] 08/05/2016, 9:47 AM

## 2016-08-05 NOTE — Progress Notes (Signed)
Occupational Therapy Session Note  Patient Details  Name: Donald [name] MRN: [id number] Date of Birth: 02/25/1941  Today's Date: 08/05/2016 OT Individual Time: 1300-1356 OT Individual Time Calculation (min): 56 min     Short Term Goals: Week 1:  OT Short Term Goal 1 (Week 1): STGs=LTGs secondary to short LOS  Skilled Therapeutic Interventions/Progress Updates:     Upon entering the room, pt seated in wheelchair with wife present in the room. OT educating caregiver on assisting pt to bathroom. She demonstrated understanding and she is checked off to assist in this task. Pt performed toilet transfer with close supervision. Pt performed hygiene and clothing management with steady assistance from wife. Pt returned to wheelchair and discussed some concerns he had regarding brace and returning home. OT educated pt and caregiver who verbalized understanding. Pt very tearful at this point in session. Pt ambulated 350' + with RW and supervision to main entrance with wife for family education. Once downstairs, pt seated in wheelchair and going outside with wife. OT notified RN.   Therapy Documentation Precautions:  Precautions Precautions: Back Precaution Comments: Reviewed back precautions during tx. Required Braces or Orthoses: Spinal Brace Spinal Brace: Thoracolumbosacral orthotic Spinal Brace Comments: with LE attatchment Restrictions Weight Bearing Restrictions: No General:   Vital Signs: Therapy Vitals Temp: 98.6 F (37 C) Temp Source: Oral Pulse Rate: 66 Resp: 18 BP: 122/65 Patient Position (if appropriate): Sitting Oxygen Therapy SpO2: 99 % O2 Device: Not Delivered  See Function Navigator for Current Functional Status.   Therapy/Group: Individual Therapy  Gypsy Decant 08/05/2016, 4:23 PM

## 2016-08-06 ENCOUNTER — Inpatient Hospital Stay (HOSPITAL_COMMUNITY): Payer: Medicare Other

## 2016-08-06 ENCOUNTER — Inpatient Hospital Stay (HOSPITAL_COMMUNITY): Payer: Medicare Other | Admitting: Occupational Therapy

## 2016-08-06 LAB — CBC
HCT: 28 % — ABNORMAL LOW (ref 39.0–52.0)
Hemoglobin: 8.7 g/dL — ABNORMAL LOW (ref 13.0–17.0)
MCH: 25.1 pg — ABNORMAL LOW (ref 26.0–34.0)
MCHC: 31.1 g/dL (ref 30.0–36.0)
MCV: 80.9 fL (ref 78.0–100.0)
PLATELETS: 236 10*3/uL (ref 150–400)
RBC: 3.46 MIL/uL — ABNORMAL LOW (ref 4.22–5.81)
RDW: 17.7 % — AB (ref 11.5–15.5)
WBC: 5.8 10*3/uL (ref 4.0–10.5)

## 2016-08-06 NOTE — Progress Notes (Signed)
Patient refused to wear prevalon boot, education provided. He said he will lay down on his side for now.

## 2016-08-06 NOTE — Progress Notes (Signed)
[facility] PHYSICAL MEDICINE & REHABILITATION     PROGRESS NOTE    Subjective/Complaints: Pt sitting up in bed this AM.  He is upset because nursing placed SCDs overnight and it made him uncomfortable and he was not able to go back to sleep.   ROS: Denies CP, SOB, N/V/D.   Objective: Vital Signs: Blood pressure 132/79, pulse 63, temperature 98.3 F (36.8 C), temperature source Oral, resp. rate 17, weight 95.1 kg (209 lb 10.5 oz), SpO2 96 %. No results found. No results for input(s): WBC, HGB, HCT, PLT in the last 72 hours.  Recent Labs  08/04/16 0500  NA 142  K 3.6  CL 109  GLUCOSE 92  BUN 9  CREATININE 0.95  CALCIUM 8.4*   CBG (last 3)  No results for input(s): GLUCAP in the last 72 hours.  Wt Readings from Last 3 Encounters:  08/06/16 95.1 kg (209 lb 10.5 oz)  07/31/16 88 kg (193 lb 14.4 oz)  07/20/16 88.5 kg (195 lb)    Physical Exam:  Constitutional: NAD. Vital signs reviewed. HENT: Normocephalic and atraumatic.  Eyes: EOMI. No discharge.  Cardiovascular: RRR. No JVD. Respiratory: clear. Unlabored GI: NT. Soft Musculoskeletal: trace bilateral LE edema Neurological: He is alert and oriented.  Motor: 4+/5 throughout Skin: surgical incision clean and intact with steristrips  Psychiatric: pleasant and appropriate  Assessment/Plan: 1. Functional and mobility deficits  secondary to lumbar discitis with radiculopathy, s/p decompression and fusion which require 3+ hours per day of interdisciplinary therapy in a comprehensive inpatient rehab setting. Physiatrist is providing close team supervision and 24 hour management of active medical problems listed below. Physiatrist and rehab team continue to assess barriers to discharge/monitor patient progress toward functional and medical goals.  Function:  Bathing Bathing position   Position: Bed  Bathing parts Body parts bathed by patient: Right arm, Left arm, Chest, Abdomen, Front perineal area, Buttocks, Right  upper leg, Left upper leg, Right lower leg, Left lower leg Body parts bathed by helper: Back  Bathing assist Assist Level: Touching or steadying assistance(Pt > 75%)      Upper Body Dressing/Undressing Upper body dressing   What is the patient wearing?: Pull over shirt/dress     Pull over shirt/dress - Perfomed by patient: Thread/unthread right sleeve, Thread/unthread left sleeve, Put head through opening, Pull shirt over trunk       Orthosis activity level: Performed by helper  Upper body assist Assist Level: Supervision or verbal cues   Set up : To obtain clothing/put away, To apply TLSO, cervical collar  Lower Body Dressing/Undressing Lower body dressing   What is the patient wearing?: Pants, Non-skid slipper socks     Pants- Performed by patient: Thread/unthread right pants leg, Thread/unthread left pants leg, Pull pants up/down   Non-skid slipper socks- Performed by patient: Don/doff right sock, Don/doff left sock Non-skid slipper socks- Performed by helper: Don/doff left sock Socks - Performed by patient: Don/doff right sock, Don/doff left sock   Shoes - Performed by patient: Don/doff right shoe, Don/doff left shoe Shoes - Performed by helper: Fasten right, Fasten left          Lower body assist Assist for lower body dressing: Touching or steadying assistance (Pt > 75%)      Toileting Toileting   Toileting steps completed by patient: Adjust clothing prior to toileting, Performs perineal hygiene, Adjust clothing after toileting   Toileting Assistive Devices: Grab bar or rail  Toileting assist Assist level: Touching or steadying assistance (Pt.75%)  Transfers Chair/bed transfer   Chair/bed transfer method: Ambulatory, Stand pivot Chair/bed transfer assist level: Supervision or verbal cues Chair/bed transfer assistive device: Walker, Orthosis     Locomotion Ambulation     Max distance: 350' Assist level: Supervision or verbal cues   Wheelchair           Cognition Comprehension Comprehension assist level: Follows complex conversation/direction with no assist  Expression Expression assist level: Expresses complex ideas: With no assist  Social Interaction Social Interaction assist level: Interacts appropriately with others - No medications needed.  Problem Solving Problem solving assist level: Solves complex problems: Recognizes & self-corrects  Memory Memory assist level: Complete Independence: No helper   Medical Problem List and Plan: 1.  Weakness, gait abnormality, balance deficits secondary to Lumbar discitis with radiculopathy.  -continue CIR therapies 2.  DVT Prophylaxis/Anticoagulation: SCD 3. Pain Management:   OxyContin 20 mg bid, reduced to 10mg  on 11/24, d/ced 11/26  Prn oxycodone.  4. Mood: Team to provide ego support to help with adjustment reaction. LCSW to follow for evaluation and support.  5. Neuropsych: This patient is capable of making decisions on his own behalf. 6. Skin/Wound Care: Monitor wound daily for healing. Maintain adequate nutritional and hydration status. Pressure relief from brace  -micro-guard powder for buttock rash 7. Fluids/Electrolytes/Nutrition: good po  -repleting potassium, K+ 3.6 on 11/24 8. Diskitis with osteomyelitis: Vancomycin/Ceftazidime D # 12/ 56+             continue to monitor levels/ follow renal status 9.  BPH: Voiding well.  10. ABLA:   hgb up to 8.7 on 11/22  Cont to monitor  -Fe+ supp, 11. Constipation: managed with senna and miralax on board.  LOS (Days) 6 A FACE TO FACE EVALUATION WAS PERFORMED  Salome Hautala Lorie Phenix 08/06/2016 7:58 AM

## 2016-08-06 NOTE — Progress Notes (Signed)
Physical Therapy Session Note  Patient Details  Name: Donald [name] MRN: [id number] Date of Birth: 18-Apr-1941  Today's Date: 08/06/2016 PT Individual Time: [id number] PT Individual Time Calculation (min): 60 min    Short Term Goals: Week 1:  PT Short Term Goal 1 (Week 1): = LTG of Mod I  Skilled Therapeutic Interventions/Progress Updates:    Session focused on functional transfers with RW and TLSO donned, gait training throughout unit during session at supervision level with RW and no episodes of buckling,  bed mobility re-training in ADL apartment on regular bed to simulate home environment (completed supervision to mod I level on second repetition), d/c planning discussion, stair negotiation (sideways) to simulate home entry x 2 repetitions with supervision, curb step negotiation with RW for community mobility with steadying assist and verbal cues for sequencing and technique, LE therex for functional strengthening, and neuro re-ed to address balance re-training on compliant surface while performing weightshifts, ball toss, and static balance with eyes closed (min assist overall). Therex included standing R hip abduction, Bilateral hamstring curls, standing toe raises, seated heel raises (unable to perform in standing), seated LAQ with 5 second hold, and mini squats x 10 reps each. Pt's wife present to observe therapy session and engage in d/c planning.    Therapy Documentation Precautions:  Precautions Precautions: Back Precaution Comments: Reviewed back precautions during tx. Required Braces or Orthoses: Spinal Brace Spinal Brace: Thoracolumbosacral orthotic Spinal Brace Comments: with LE attatchment Restrictions Weight Bearing Restrictions: No  Pain:  Medication given during session for back pain.    See Function Navigator for Current Functional Status.   Therapy/Group: Individual Therapy  Canary Brim Ivory Broad, PT, DPT  08/06/2016, 12:04 PM

## 2016-08-06 NOTE — Progress Notes (Signed)
Physical Therapy Session Note  Patient Details  Name: Donald [name] MRN: [id number] Date of Birth: 22-Feb-1941  Today's Date: 08/06/2016 PT Individual Time: 1345-1428 PT Individual Time Calculation (min): 43 min    Short Term Goals: Week 1:  PT Short Term Goal 1 (Week 1): = LTG of Mod I  Skilled Therapeutic Interventions/Progress Updates:    Session focused on gait training with RW on unit with supervision (TLSO already donned), dynamic gait through obstacle course and over compliant surface to simulate home/community mobility with steadying assist X 2 trials, neuro re-ed for balance re-training on compliant surface while performing functional reaching task (steady assist without UE support), functional transfers with improved technique and decreased cues needed, and LE strengthening on Kinetron on resistance of 20 cm/sec x 5 trials averaging about 1 min each with rest breaks as needed. Pt reports increased confidence with d/c planned for Tuesday.    Therapy Documentation Precautions:  Precautions Precautions: Back Precaution Comments: Reviewed back precautions during tx. Required Braces or Orthoses: Spinal Brace Spinal Brace: Thoracolumbosacral orthotic Spinal Brace Comments: with LE attatchment Restrictions Weight Bearing Restrictions: No   Pain: Premedicated. No complaints.  See Function Navigator for Current Functional Status.   Therapy/Group: Individual Therapy  Canary Brim Ivory Broad, PT, DPT  08/06/2016, 3:11 PM

## 2016-08-06 NOTE — Progress Notes (Signed)
Occupational Therapy Session Note  Patient Details  Name: ROCIO ROME MRN: [id number] Date of Birth: [birth year]  Today's Date: 08/06/2016 OT Individual Time: [id number]  And [phone] OT Individual Time Calculation (min): 31 min and 60 minutes  Short Term Goals: Week 1:  OT Short Term Goal 1 (Week 1): STGs=LTGs secondary to short LOS  Skilled Therapeutic Interventions/Progress Updates:   Pt was sitting in recliner with spouse [name] present at start of session. Education provided on grad day, sequence of discharge, as well as proper sleep positioning (with pillows) when sidelying due to pt needing to sleep off of heels at night. Emphasis on adherence to back precautions. Questions from spouse and pt were answered during discussion with report of helpfulness. Afterwards pt ambulated to gym with supervision, RW, and spouse. Wii activity completed while standing without UE support for LE strengthening and balance remediation while engaged in functional task. Activity also appeared to increase pts psychosocial wellness as evidenced by laughing and smiling. Afterwards pt completed unloading/loading dishwasher activity in therapy apartment due to task being meaningful role at home. Emphasis on flexing/extending knees for LE strengthening while adhering to back precautions. Pt educated on importance of integrating LE strengthening into daily routine/activities with verbalized understanding. Pt then ambulated back to room in manner as written above. Pt was left in recliner with spouse present and all needs within reach at time of departure.   2nd Session 1:1 Tx (31 minutes) Pt was seated at EOB at time of arrival, was agreeable to complete individualized HEP for balance/LE strengthening. Pt was provided paper handout with standing exercises to complete at home with emphasis on having [name] present and using sturdy chair for completion with verbalized understanding. Pt completed all exercises x10 reps and  instruction on technique with use of back of recliner for UE support. At end of session pt was transferred back to recliner and left with all needs within reach.   Therapy Documentation Precautions:  Precautions Precautions: Back Precaution Comments: Reviewed back precautions during tx. Required Braces or Orthoses: Spinal Brace Spinal Brace: Thoracolumbosacral orthotic Spinal Brace Comments: with LE attatchment Restrictions Weight Bearing Restrictions: No General:   Vital Signs: Therapy Vitals Temp: 98.2 F (36.8 C) Temp Source: Oral Pulse Rate: 60 Resp: 17 BP: (!) 103/53 Patient Position (if appropriate): Sitting Oxygen Therapy SpO2: 96 % O2 Device: Not Delivered Pain: No c/o pain during sessions   ADL:      See Function Navigator for Current Functional Status.   Therapy/Group: Individual Therapy  Dashton Czerwinski A Krysten Veronica 08/06/2016, 3:47 PM

## 2016-08-07 ENCOUNTER — Inpatient Hospital Stay (HOSPITAL_COMMUNITY): Payer: Medicare Other | Admitting: Physical Therapy

## 2016-08-07 ENCOUNTER — Inpatient Hospital Stay (HOSPITAL_COMMUNITY): Payer: Medicare Other | Admitting: Occupational Therapy

## 2016-08-07 LAB — BASIC METABOLIC PANEL
ANION GAP: 6 (ref 5–15)
BUN: 9 mg/dL (ref 6–20)
CALCIUM: 8.5 mg/dL — AB (ref 8.9–10.3)
CO2: 28 mmol/L (ref 22–32)
CREATININE: 0.9 mg/dL (ref 0.61–1.24)
Chloride: 109 mmol/L (ref 101–111)
Glucose, Bld: 97 mg/dL (ref 65–99)
Potassium: 3.9 mmol/L (ref 3.5–5.1)
SODIUM: 143 mmol/L (ref 135–145)

## 2016-08-07 LAB — VANCOMYCIN, TROUGH: Vancomycin Tr: 21 ug/mL (ref 15–20)

## 2016-08-07 MED ORDER — VANCOMYCIN HCL 10 G IV SOLR
1750.0000 mg | INTRAVENOUS | Status: DC
  Administered 2016-08-07 – 2016-08-08 (×2): 1750 mg via INTRAVENOUS
  Filled 2016-08-07 (×2): qty 1750

## 2016-08-07 NOTE — Progress Notes (Signed)
Physical Therapy Discharge Summary  Patient Details  Name: UNNAMED ZEIEN MRN: [id number] Date of Birth: March 05, 1941  Today's Date: 08/07/2016 PT Individual Time: [phone] and [id number] PT Individual Time Calculation (min): 83 min and 67 min    Patient has met 7 of 7 long term goals due to improved activity tolerance, improved balance, improved postural control, increased strength, decreased pain, ability to compensate for deficits and improved safety awareness.  Patient to discharge at an ambulatory level Modified Independent.   Patient's care partner is independent to provide the necessary physical and cognitive assistance at discharge.  Reasons goals not met: n/a  Recommendation:  Patient will benefit from ongoing skilled PT services in home health setting to continue to advance safe functional mobility, address ongoing impairments in decreased dynamic balance without UE support, progressing to ambulating with more LRAD, decreased BLE strength, decreased endurance, and minimize fall risk.  Equipment: No equipment provided - pt already has a RW  Reasons for discharge: treatment goals met  Patient/family agrees with progress made and goals achieved: Yes   Skilled PT Treatment: Treatment 1: Pt received in bathroom with (+) BM; pt utilized AD to assist with peri hygiene but then required assist from PT to ensure cleanliness. Pt with dark stool but reports this is normal since he is taking an iron pill. Pt agreeable to tx noting 7/10 LLE pain/cramping & RN made aware; meds administered during session. Pt & wife both voice comfort with donning pt's TLSO; brace was donned upon PT arrival. Educated pt on home modifications & safety (remove throw rugs, be mindful of transitions from smooth floor to carpet). During session pt ambulated with RW over even & uneven surfaces, performed sit<>stand transfers, car transfers from low sedan, simulated height, and bed mobility in apartment bed at Mod I  level. Pt negotiated 12 steps with either 1 rail laterally or B rails forwards with supervision, demonstrating compensatory technique without cuing. Pt recalled all back precautions without cuing. Provided pt with bag to store reacher on RW and pt retrieved object from floor by squatting & using AD. Utilized cybex kinetron in sitting, up to 30 cm/sec, for BLE strengthening. At end of session pt left sitting in recliner with all needs within reach & wife & RN present.  Treatment 2: Pt received in recliner & agreeable to tx (TLSO with LLE piece already donned), noting 4/10 pain in LLE but not due for pain medication per RN's note on board in room.  Pt reported "it feels better already" after standing. During session pt ambulated with RW & mod I throughout unit to/from ortho gym. Pt completed Berg Balance Test & scored 26/56; educated pt on interpretation of score & increased fall risk. Patient demonstrates increased fall risk as noted by score of 26/56 on Berg Balance Scale.  (<36= high risk for falls, close to 100%; 37-45 significant >80%; 46-51 moderate >50%; 52-55 lower >25%) Pt completed TUG with RW in 26 seconds. Pt limited by continuing muscle cramps in LLE. Pt requested to perform HEP & completed the following BLE strengthening exercises: long arc quads, standing knee flexion, seated heel raises with calf squeeze, seated quad stretch, and mini squats.  Provided education & demonstration on seated calf stretch with sheet & pt able to return demonstrate; educated pt to hold all stretches 30 seconds each x 3 repetitions. Therapist provided cuing & education regarding proper technique of all strengthening exercises. Reviewed use of, and how to don, prevalon boots and encouraged pt to suspend heel  to promote healing. At end of session pt left in recliner in room with all needs within reach & wife present.    PT Discharge Precautions/Restrictions Precautions Precautions: Back Required Braces or Orthoses:  Spinal Brace Spinal Brace: Thoracolumbosacral orthotic;Applied in supine position Spinal Brace Comments: with LLE extension piece Restrictions Weight Bearing Restrictions: No  Pain Pain Assessment Pain Assessment: 0-10 Pain Score: 7  Pain Location: Leg Pain Orientation: Left Pain Descriptors / Indicators: Cramping Pain Intervention(s): RN made aware  Vision/Perception  Pt wears glasses all the time at baseline. Pt denies any changes in vision since admission to [facility].  Cognition Overall Cognitive Status: Within Functional Limits for tasks assessed Arousal/Alertness: Awake/alert Orientation Level: Oriented X4 Memory: Appears intact Awareness: Appears intact Safety/Judgment: Appears intact  Sensation Sensation Light Touch: Appears Intact (BLE) Proprioception: Appears Intact (BLE) Coordination Gross Motor Movements are Fluid and Coordinated: Yes  Motor  Motor Motor - Skilled Clinical Observations:  (general improvements but still BLE weakness)   Mobility Bed Mobility Bed Mobility: Rolling Right;Supine to Sit;Sit to Supine (apartment bed) Rolling Right: 6: Modified independent (Device/Increase time) Supine to Sit: 6: Modified independent (Device/Increase time) Sit to Supine: 6: Modified independent (Device/Increase time) Transfers Transfers: Yes Sit to Stand: 6: Modified independent (Device/Increase time) Stand to Sit: 6: Modified independent (Device/Increase time)  Locomotion  Ambulation Ambulation: Yes Ambulation/Gait Assistance: 6: Modified independent (Device/Increase time) Ambulation Distance (Feet): 150 Feet Assistive device: Rolling walker Gait Gait: Yes Gait Pattern:  (decreased step length LLE (limited by TLSO with Leg extension piece), decreased gait speed) Stairs / Additional Locomotion Stairs: Yes Stairs Assistance: 5: Supervision Stair Management Technique: One rail Left;Two rails Number of Stairs: 12 Height of Stairs: 6 (inches) Wheelchair  Mobility Wheelchair Mobility: No  Trunk/Postural Assessment  Cervical Assessment Cervical Assessment: Within Functional Limits Thoracic Assessment Thoracic Assessment:  (TLSO) Lumbar Assessment Lumbar Assessment:  (TLSO)   Balance Balance Balance Assessed: Yes Standardized Balance Assessment Standardized Balance Assessment: Berg Balance Test;Timed Up and Go Test Berg Balance Test Sit to Stand: Able to stand  independently using hands Standing Unsupported: Able to stand safely 2 minutes Sitting with Back Unsupported but Feet Supported on Floor or Stool: Able to sit safely and securely 2 minutes Stand to Sit: Sits safely with minimal use of hands Transfers: Able to transfer with verbal cueing and /or supervision Standing Unsupported with Eyes Closed: Able to stand 10 seconds with supervision Standing Ubsupported with Feet Together: Able to place feet together independently and stand for 1 minute with supervision From Standing, Reach Forward with Outstretched Arm: Reaches forward but needs supervision (4 inches) From Standing Position, Pick up Object from Floor: Unable to try/needs assist to keep balance (2/2 back precautions) From Standing Position, Turn to Look Behind Over each Shoulder: Needs assist to keep from losing balance and falling (2/2 back precautions) Turn 360 Degrees: Needs assistance while turning (able to complete with very close supervision and one small loss of balance onto mat table) Standing Unsupported, Alternately Place Feet on Step/Stool: Needs assistance to keep from falling or unable to try Standing Unsupported, One Foot in Front: Able to take small step independently and hold 30 seconds Standing on One Leg: Unable to try or needs assist to prevent fall Total Score: 26 Timed Up and Go Test TUG: Normal TUG Normal TUG (seconds): 26  Extremity Assessment  RLE Assessment RLE Assessment: Within Functional Limits LLE Assessment LLE Assessment: Within Functional  Limits   See Function Navigator for Current Functional Status.  Gerlene Burdock  Adeli Frost 08/07/2016, 2:48 PM

## 2016-08-07 NOTE — Plan of Care (Signed)
Problem: RH Balance Goal: LTG Patient will maintain dynamic standing balance (PT) LTG:  Patient will maintain dynamic standing balance with assistance during mobility activities (PT)  Outcome: Completed/Met Date Met: 08/07/16 With RW  Problem: RH Bed to Chair Transfers Goal: LTG Patient will perform bed/chair transfers w/assist (PT) LTG: Patient will perform bed/chair transfers with assistance, with/without cues (PT).  Outcome: Completed/Met Date Met: 08/07/16 With RW  Problem: RH Car Transfers Goal: LTG Patient will perform car transfers with assist (PT) LTG: Patient will perform car transfers with assistance (PT).  Outcome: Completed/Met Date Met: 08/07/16 With RW  Problem: RH Ambulation Goal: LTG Patient will ambulate in controlled environment (PT) LTG: Patient will ambulate in a controlled environment, # of feet with assistance (PT).  Outcome: Completed/Met Date Met: 08/07/16 150 ft with RW Goal: LTG Patient will ambulate in home environment (PT) LTG: Patient will ambulate in home environment, # of feet with assistance (PT).  Outcome: Completed/Met Date Met: 08/07/16 50 ft with RW  Problem: RH Stairs Goal: LTG Patient will ambulate up and down stairs w/assist (PT) LTG: Patient will ambulate up and down # of stairs with assistance (PT)  Outcome: Completed/Met Date Met: 08/07/16 12 steps with either 1 or B rails   

## 2016-08-07 NOTE — Progress Notes (Signed)
Pharmacy Antibiotic Note  Donald [name] is a 75 y.o. male admitted on 07/31/2016 with epidural abscess/osteomyelitis.  Pharmacy has been consulted for Vancomycin dosing. WBC WNL/stable. Renal function normal/stable.   10/23 VT 12 on 750mg  q12, increase to 1g q12 11/10 VT 18 on 1g q12 - cont 11/16 VT 19 on 1 gm q12 ( VT drawn 1 hr after dose due)  VT 11/23 = 21 on 1g IV every 12h 11/27 VT= 21 on 1g IV q12h, will decrease slightly to 1750 mg IV q24h  Plan: -Decrease vancomycin to 1750 mg IV q24h -Re-check trough at steady state  Weight: 209 lb 10.5 oz (95.1 kg)  Temp (24hrs), Avg:98.3 F (36.8 C), Min:98.2 F (36.8 C), Max:98.3 F (36.8 C)   Recent Labs Lab 07/31/16 0515 08/01/16 0438 08/02/16 0610 08/03/16 1201 08/04/16 0500 08/06/16 2337  WBC  --  4.9 5.1  --   --  5.8  CREATININE 0.92 0.87  --   --  0.95 0.90  VANCOTROUGH  --   --   --  21*  --  21*    Estimated Creatinine Clearance: 87.5 mL/min (by C-G formula based on SCr of 0.9 mg/dL).    Allergies  Allergen Reactions  . Iodine Anaphylaxis  . Ivp Dye [Iodinated Diagnostic Agents] Anaphylaxis    Narda Bonds 08/07/2016 12:36 AM

## 2016-08-07 NOTE — Discharge Summary (Signed)
Occupational Therapy Discharge Summary  Patient Details  Name: Donald [name] MRN: [id number] Date of Birth: 01/21/1941  Today's Date: 08/07/2016 OT Individual Time:  - 1058-1200 Individual Treatment Time Calculation (min): 62    Patient has met 7 of 7 long term goals due to improved activity tolerance, improved balance, ability to compensate for deficits and improved awareness.  Patient to discharge at overall Supervision level.  Patient's care partner is independent to provide the necessary physical and cognitive assistance at discharge.    All goals met.   Recommendation:  Patient discharged to supervision level and does not require follow up therapy at this time. Pt engaged in discussion regarding follow up with verbalized agreement.   Equipment: None required  Reasons for discharge: treatment goals met  Patient/family agrees with progress made and goals achieved: Yes   Skilled Therapeutic Intervention:  Skilled OT session completed with focus on caregiver education, ADL reassessment, and d/c planning. Pt was in recliner with spouse [name] present at time of arrival, agreeable to complete ADLs bedlevel with setup. Spouse Nancy doffed/donned brace during session with instruction on adjusting bed height for proper body mechanics. UB/LB bathing/dressing completed with overall supervision for min cuing for maintaining back precautions. Footwear donned at EOB with use of AE. Discussed with pt overall progress and goal achievement with verbalized feelings of pride. DME needs and follow up OT discussed with results recorded below. Pt was left in recliner with all needs within reach and [place] present. No further questions regarding discharge tomorrow.   OT Discharge Precautions/Restrictions Precautions Precautions: Back Required Braces or Orthoses: Spinal Brace Spinal Brace: Thoracolumbosacral orthotic;Applied in supine position Spinal Brace Comments:  (with L LE extension  piece) Restrictions Weight Bearing Restrictions: No   Pain No c/o pain during session  Pain Assessment Pain Assessment: 0-10 Pain Score: 7  Pain Location: Leg Pain Orientation: Left Pain Descriptors / Indicators: Cramping Pain Intervention(s): RN made aware ADL ADL ADL Comments: Please see functional navigator for ADL status Vision/Perception  Vision- History Baseline Vision/History: Wears glasses Wears Glasses: At all times Patient Visual Report: No change from baseline Vision- Assessment Vision Assessment?: No apparent visual deficits Praxis Praxis-Other Comments:  (during basic self care completion)  Cognition Overall Cognitive Status: Within Functional Limits for tasks assessed Arousal/Alertness: Awake/alert Orientation Level: Oriented X4 Attention: Sustained Memory: Appears intact Awareness: Appears intact Problem Solving: Appears intact Safety/Judgment: Appears intact Sensation Sensation Light Touch: Appears Intact Stereognosis: Not tested Hot/Cold: Appears Intact Proprioception: Appears Intact Coordination Gross Motor Movements are Fluid and Coordinated: Yes Fine Motor Movements are Fluid and Coordinated: Yes Motor  Motor Motor: Abnormal postural alignment and control Motor - Skilled Clinical Observations:  (global LE weakness ) Mobility  Bed Mobility Bed Mobility: Rolling Right;Rolling Left;Sit to Supine;Supine to Sit Rolling Right: 6: Modified independent (Device/Increase time) Supine to Sit: 6: Modified independent (Device/Increase time) Sit to Supine: 6: Modified independent (Device/Increase time) Transfers Transfers: Sit to Stand;Stand to Sit Sit to Stand: 6: Modified independent (Device/Increase time) Stand to Sit: 6: Modified independent (Device/Increase time)  Trunk/Postural Assessment  Cervical Assessment Cervical Assessment: Within Functional Limits Thoracic Assessment Thoracic Assessment: Exceptions to WFL (TLSO) Lumbar Assessment Lumbar  Assessment: Exceptions to [facility] (N/A due to TLSO) Postural Control Postural Control: Within Functional Limits  Balance Balance Balance Assessed: Yes Dynamic Sitting Balance Sitting balance - Comments: Mod I  Dynamic Standing Balance Dynamic Standing - Comments: Mod I  Extremity/Trunk Assessment RUE Assessment RUE Assessment: Within Functional Limits LUE Assessment LUE Assessment: Within Functional  Limits   See Function Navigator for Current Functional Status.  Brelyn Woehl A Shaunie Boehm 08/07/2016, 11:36 AM

## 2016-08-07 NOTE — Progress Notes (Signed)
[facility] PHYSICAL MEDICINE & REHABILITATION     PROGRESS NOTE    Subjective/Complaints: No new complaints. He and wife just placed brace. Had some questions about discharge process.   ROS: Pt denies fever, rash/itching, headache, blurred or double vision, nausea, vomiting, abdominal pain, diarrhea, chest pain, shortness of breath, palpitations, dysuria, dizziness,   bleeding, anxiety, or depression    Objective: Vital Signs: Blood pressure 124/76, pulse 64, temperature 98.2 F (36.8 C), temperature source Oral, resp. rate 16, weight 95.7 kg (210 lb 15.7 oz), SpO2 96 %. No results found.  Recent Labs  08/06/16 2337  WBC 5.8  HGB 8.7*  HCT 28.0*  PLT 236    Recent Labs  08/06/16 2337  NA 143  K 3.9  CL 109  GLUCOSE 97  BUN 9  CREATININE 0.90  CALCIUM 8.5*   CBG (last 3)  No results for input(s): GLUCAP in the last 72 hours.  Wt Readings from Last 3 Encounters:  08/06/16 95.7 kg (210 lb 15.7 oz)  07/31/16 88 kg (193 lb 14.4 oz)  07/20/16 88.5 kg (195 lb)    Physical Exam:  Constitutional: NAD. HENT: NCAT Eyes: EOMI. anicteric  Cardiovascular: RRR. Respiratory: clear. Unlabored GI: NT. Soft Musculoskeletal: trace bilateral LE edema Neurological: He is alert and oriented.  Motor: 4+/5 throughout Skin: surgical incision clean and intact with steristrips  Psychiatric: pleasant and appropriate  Assessment/Plan: 1. Functional and mobility deficits  secondary to lumbar discitis with radiculopathy, s/p decompression and fusion which require 3+ hours per day of interdisciplinary therapy in a comprehensive inpatient rehab setting. Physiatrist is providing close team supervision and 24 hour management of active medical problems listed below. Physiatrist and rehab team continue to assess barriers to discharge/monitor patient progress toward functional and medical goals.  Function:  Bathing Bathing position   Position: Bed  Bathing parts Body parts bathed by  patient: Right arm, Left arm, Chest, Abdomen, Front perineal area, Buttocks, Right upper leg, Left upper leg, Right lower leg, Left lower leg Body parts bathed by helper: Back  Bathing assist Assist Level: Touching or steadying assistance(Pt > 75%)      Upper Body Dressing/Undressing Upper body dressing   What is the patient wearing?: Pull over shirt/dress     Pull over shirt/dress - Perfomed by patient: Thread/unthread right sleeve, Thread/unthread left sleeve, Put head through opening, Pull shirt over trunk       Orthosis activity level: Performed by helper  Upper body assist Assist Level: Supervision or verbal cues   Set up : To obtain clothing/put away, To apply TLSO, cervical collar  Lower Body Dressing/Undressing Lower body dressing   What is the patient wearing?: Pants, Non-skid slipper socks     Pants- Performed by patient: Thread/unthread right pants leg, Thread/unthread left pants leg, Pull pants up/down   Non-skid slipper socks- Performed by patient: Don/doff right sock, Don/doff left sock Non-skid slipper socks- Performed by helper: Don/doff left sock Socks - Performed by patient: Don/doff right sock, Don/doff left sock   Shoes - Performed by patient: Don/doff right shoe, Don/doff left shoe Shoes - Performed by helper: Fasten right, Fasten left          Lower body assist Assist for lower body dressing: Touching or steadying assistance (Pt > 75%)      Toileting Toileting   Toileting steps completed by patient: Adjust clothing prior to toileting, Performs perineal hygiene, Adjust clothing after toileting   Toileting Assistive Devices: Grab bar or rail  Toileting assist  Assist level: Touching or steadying assistance (Pt.75%)   Transfers Chair/bed transfer   Chair/bed transfer method: Ambulatory, Stand pivot Chair/bed transfer assist level: Supervision or verbal cues Chair/bed transfer assistive device: Armrests, Walker, Orthosis     Locomotion Ambulation      Max distance: 250' Assist level: Supervision or verbal cues   Wheelchair          Cognition Comprehension Comprehension assist level: Follows complex conversation/direction with no assist  Expression Expression assist level: Expresses complex ideas: With no assist  Social Interaction Social Interaction assist level: Interacts appropriately with others - No medications needed.  Problem Solving Problem solving assist level: Solves complex problems: Recognizes & self-corrects  Memory Memory assist level: Complete Independence: No helper   Medical Problem List and Plan: 1.  Weakness, gait abnormality, balance deficits secondary to Lumbar discitis with radiculopathy.  -continue CIR therapies 2.  DVT Prophylaxis/Anticoagulation: SCD 3. Pain Management:   OxyContin 20 mg bid, reduced to 10mg  on 11/24, d/ced 11/26  Prn oxycodone has been effective---happy with this only.  4. Mood: Team to provide ego support to help with adjustment reaction. LCSW to follow for evaluation and support.  5. Neuropsych: This patient is capable of making decisions on his own behalf. 6. Skin/Wound Care: Monitor wound daily for healing. Maintain adequate nutritional and hydration status. Pressure relief from brace  -micro-guard powder for buttock rash 7. Fluids/Electrolytes/Nutrition: good po  -repleting potassium, K+ 3.9 on 11/26 8. Diskitis with osteomyelitis: Vancomycin/Ceftazidime D # 12/ 56+             continue to monitor levels/ follow renal status  -HHRN for IV abx set up 9.  BPH: Voiding well.  10. ABLA:   hgb up to 8.7 on 11/26  Cont to monitor  -Fe+ supp, 11. Constipation: managed with senna and miralax on board.  LOS (Days) 7 A FACE TO FACE EVALUATION WAS PERFORMED  SWARTZ,ZACHARY T 08/07/2016 8:21 AM

## 2016-08-08 MED ORDER — NYSTATIN 100000 UNIT/GM EX POWD: Freq: Two times a day (BID) | CUTANEOUS | 0 refills | Status: DC

## 2016-08-08 MED ORDER — OXYCODONE-ACETAMINOPHEN 5-325 MG PO TABS: 1.0000 | ORAL_TABLET | Freq: Three times a day (TID) | ORAL | 0 refills | Status: DC | PRN

## 2016-08-08 MED ORDER — ACETAMINOPHEN 325 MG PO TABS: 325.0000 mg | ORAL_TABLET | ORAL | Status: DC | PRN

## 2016-08-08 MED ORDER — DEXTROSE 5 % IV SOLN: 2.0000 g | Freq: Three times a day (TID) | INTRAVENOUS | 0 refills | Status: DC

## 2016-08-08 MED ORDER — POTASSIUM CHLORIDE CRYS ER 20 MEQ PO TBCR: 20.0000 meq | EXTENDED_RELEASE_TABLET | Freq: Every day | ORAL | 0 refills | Status: DC

## 2016-08-08 MED ORDER — CYCLOBENZAPRINE HCL 5 MG PO TABS: 5.0000 mg | ORAL_TABLET | Freq: Three times a day (TID) | ORAL | 0 refills | Status: DC | PRN

## 2016-08-08 MED ORDER — FERROUS SULFATE 325 (65 FE) MG PO TABS: 325.0000 mg | ORAL_TABLET | Freq: Two times a day (BID) | ORAL | 0 refills | Status: DC

## 2016-08-08 MED ORDER — VANCOMYCIN HCL 10 G IV SOLR: 1750.0000 mg | INTRAVENOUS | 1 refills | Status: DC

## 2016-08-08 MED ORDER — HEPARIN SOD (PORK) LOCK FLUSH 100 UNIT/ML IV SOLN
250.0000 [IU] | INTRAVENOUS | Status: AC | PRN
  Administered 2016-08-08: 250 [IU]

## 2016-08-08 NOTE — Progress Notes (Signed)
Social Work  Discharge Note  The overall goal for the admission was met for:   Discharge location: Yes - home with wife able to provide 24/7 assistance  Length of Stay: Yes - 8 days  Discharge activity level: Yes - supervision overall  Home/community participation: Yes  Services provided included: MD, RD, PT, OT, RN, TR, Pharmacy and [place]:  [place] [employer]  Follow-up services arranged: Home Health: RN, PT, OT via [facility] and Patient/Family has no preference for HH/DME agencies  Comments (or additional information):  Patient/Family verbalized understanding of follow-up arrangements: Yes  Individual responsible for coordination of the follow-up plan: pt  Confirmed correct DME delivered: NA - had all needed DME    Dewie Ahart

## 2016-08-08 NOTE — Progress Notes (Signed)
Patient discharged to home accompanied by his wife. 

## 2016-08-08 NOTE — Discharge Summary (Signed)
Physician Discharge Summary  Patient ID: Donald [name] MRN: [id number] DOB/AGE: Jun 24, 1941 75 y.o.  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[place]) Active Problems:   Discitis of lumbar region   Abscess in epidural space of lumbar spine   Status post lumbar laminectomy   Acute blood loss anemia   Lumbar radiculopathy   Post-operative pain   Discharged Condition: stable  Significant Diagnostic Studies:  Labs:  Basic Metabolic Panel: BMP Latest Ref Rng & Units 08/06/2016 08/04/2016 08/01/2016  Glucose 65 - 99 mg/dL 97 92 105(H)  BUN 6 - 20 mg/dL 9 9 8   Creatinine 0.61 - 1.24 mg/dL 0.90 0.95 0.87  Sodium 135 - 145 mmol/L 143 142 140  Potassium 3.5 - 5.1 mmol/L 3.9 3.6 3.3(L)  Chloride 101 - 111 mmol/L 109 109 108  CO2 22 - 32 mmol/L 28 26 24   Calcium 8.9 - 10.3 mg/dL 8.5(L) 8.4(L) 7.9(L)    CBC:  Recent Labs Lab 08/02/16 0610 08/06/16 2337  WBC 5.1 5.8  HGB 8.7* 8.7*  HCT 28.1* 28.0*  MCV 80.5 80.9  PLT 229 236    CBG: No results for input(s): GLUCAP in the last 168 hours.  Brief HPI:   Donald Seefried Thurstonis a 75 y.o.malewith history of prostate cancer, lumbar stenosis s/p L3-L5 decompression 7/20/17who started developing BLE pain with spasms and was found to have L4-L5 osteomyelitis with epidural abscess. He was admitted for I &D of lumbar wound on 10/19/17and discharged to home with plans for IV antibiotics. He  was readmitted with 11/9/17with increase in back pain with radiculopathyand decline in mobility. Follow up MRI showed recurrence of abscess and progressive discitis and osteomyelitis with destruction of L4 and L5 vertebral body. He underwent revision of L4/5 decompression with I and D of infected material and posterior fusion L3-S1. Dr. Linus Salmons recommended continuing vancomycin/ceftazidime for 8 weeks at least and follow up with RICD in 4 weeks.  TLSO with left  leg extension ordered for support. Therapy ongoing revealing decline in mobility as well as ability to carry out self care tasks. CIR recommended for follow up therapy.    Hospital Course: Donald [name] was admitted to rehab 07/31/2016 for inpatient therapies to consist of PT and OT at least three hours five days a week.  Past admission physiatrist, therapy team and rehab RN have worked together to provide customized collaborative inpatient rehab. Blood pressures have been stable and he has been afebrile during his stay. BLE strength and coordination have improved and his back incision is clean, dry and intact. He is tolerating Vancomycin and Ceftazidime without significant side effects.  Fungal rash in skin folds  has resolved with use nystatin powder. Check of lytes shows that renal status is stable and hypokalemia has resolved with supplement. He continues on K dur at discharge and [facility] to draw BMET on 12/4 with results to primary MD.  Follow up CBC shows that ABLA and WBC are stable. He is continent of bowel and bladder. Miralax bid has been effective in managing OIC and he was advised on adjusting laxative after discharge.  Ego support has been provided by rehab team and he has made great progress during his rehab stay. He will continue to receive follow up HHPT and [facility] by [facility] after discharge.     Rehab course: During patient's stay in rehab weekly team conferences were held to monitor patient's progress, set goals and discuss  barriers to discharge. At admission, patient required min assist with mobility and basic self care tasks. He has had improvement in activity tolerance, balance, postural control, as well as ability to compensate for deficits. He is modified independent for bed mobility and is able to ambulate 152' with RW. He requires supervision to navigate stairs. Berg Balance score at 26/26 and supervision recommended for fall prevention. He is able to complete ADL tasks with  AE and supervision. Wife was educated on don/doffing of brace, providing cues for back precautions, HEP as well as safety with all aspects of mobility.  He has declined follow up [place].     Disposition: Home   Diet: Regular.   Special Instructions: 1. Wear brace before getting out of bed. 2. Maintain pressure relief measures when in bed. Elevate feet off pillows. Wear shoes when ambulating. 3. Take nutritional supplements twice a day between meals.  4. HHRN to recheck BMET on 12/4 with results to Dr. Carlota Raspberry to help decide on need for [place].    Discharge Instructions    Ambulatory referral to Physical Medicine Rehab    Complete by:  As directed    1-2 week follow up        Medication List    STOP taking these medications   methocarbamol 500 MG tablet Commonly known as:  ROBAXIN   morphine 2 MG/ML injection   ondansetron 4 MG/2ML Soln injection Commonly known as:  ZOFRAN   oxyCODONE 20 mg 12 hr tablet Commonly known as:  OXYCONTIN   vancomycin 1-5 GM/200ML-% Soln Commonly known as:  VANCOCIN     TAKE these medications   acetaminophen 325 MG tablet Commonly known as:  TYLENOL Take 1-2 tablets (325-650 mg total) by mouth every 4 (four) hours as needed for mild pain.   alum & mag hydroxide-simeth 200-200-20 MG/5ML suspension Commonly known as:  MAALOX/MYLANTA Take 30 mLs by mouth every 6 (six) hours as needed for indigestion.   cefTAZidime 2 g in dextrose 5 % 50 mL Inject 2 g into the vein every 8 (eight) hours.   cyclobenzaprine 5 MG tablet Commonly known as:  FLEXERIL Take 1 tablet (5 mg total) by mouth 3 (three) times daily as needed for muscle spasms.   ferrous sulfate 325 (65 FE) MG tablet Take 1 tablet (325 mg total) by mouth 2 (two) times daily with a meal.   multivitamin capsule Take 1 capsule by mouth daily.   nystatin powder Commonly known as:  MYCOSTATIN/NYSTOP Apply topically 2 (two) times daily.   oxyCODONE-acetaminophen 5-325 MG tablet--Rx # 42  pills  Commonly known as:  PERCOCET/ROXICET Take 1-2 tablets by mouth every 8 (eight) hours as needed for severe pain. What changed:  when to take this   polyethylene glycol packet Commonly known as:  MIRALAX / GLYCOLAX Take 17 g by mouth 2 (two) times daily.   potassium chloride SA 20 MEQ tablet Commonly known as:  K-DUR,KLOR-CON Take 1 tablet (20 mEq total) by mouth daily. Start taking on:  08/09/2016   senna-docusate 8.6-50 MG tablet Commonly known as:  Senokot-S Take 1 tablet by mouth 2 (two) times daily.   vancomycin 1,750 mg in sodium chloride 0.9 % 500 mL Inject 1,750 mg into the vein daily.   zolpidem 10 MG tablet Commonly known as:  AMBIEN TAKE 1/2 TABLET AT BEDTIME AS NEEDED FOR SLEEP.      Follow-up Information    Meredith Staggers, MD Follow up.   Specialty:  Physical Medicine and Rehabilitation Why:  office will call you with follow up appointments Contact information: [address] [place] [address] [phone]        Sinclair Ship, MD. Call.   Specialty:  Orthopedic Surgery Why:  for follow up appointment Contact information: [place] [address] [place] [address] [phone]        [name], MD Follow up on 08/24/2016.   Specialties:  Family Medicine, Sports Medicine Why:  @ 11:30 am (hospital follow up appt). Discuss need to continue K dur. Contact information: [address] [place] [id number]           Signed: Bary Leriche 08/08/2016, 5:23 PM

## 2016-08-08 NOTE — Progress Notes (Signed)
[facility] PHYSICAL MEDICINE & REHABILITATION     PROGRESS NOTE    Subjective/Complaints: A little anxious about having things pulled together for home. Otherwise feels great.  ROS: Pt denies fever, rash/itching, headache, blurred or double vision, nausea, vomiting, abdominal pain, diarrhea, chest pain, shortness of breath, palpitations, dysuria, dizziness,   bleeding, anxiety, or depression     Objective: Vital Signs: Blood pressure 135/77, pulse 64, temperature 98.2 F (36.8 C), temperature source Oral, resp. rate 18, weight 95.7 kg (210 lb 15.7 oz), SpO2 96 %. No results found.  Recent Labs  08/06/16 2337  WBC 5.8  HGB 8.7*  HCT 28.0*  PLT 236    Recent Labs  08/06/16 2337  NA 143  K 3.9  CL 109  GLUCOSE 97  BUN 9  CREATININE 0.90  CALCIUM 8.5*   CBG (last 3)  No results for input(s): GLUCAP in the last 72 hours.  Wt Readings from Last 3 Encounters:  08/06/16 95.7 kg (210 lb 15.7 oz)  07/31/16 88 kg (193 lb 14.4 oz)  07/20/16 88.5 kg (195 lb)    Physical Exam:  Constitutional: no distress HENT: dentition good Eyes: EOMI  Cardiovascular: RRR. Respiratory: clear. Unlabored GI: NT. Soft Musculoskeletal: no edema Neurological: He is alert and oriented.  Motor: 5/5 throughout Skin: surgical incision clean/closed  Psychiatric: pleasant  Assessment/Plan: 1. Functional and mobility deficits  secondary to lumbar discitis with radiculopathy, s/p decompression and fusion which require 3+ hours per day of interdisciplinary therapy in a comprehensive inpatient rehab setting. Physiatrist is providing close team supervision and 24 hour management of active medical problems listed below. Physiatrist and rehab team continue to assess barriers to discharge/monitor patient progress toward functional and medical goals.  Function:  Bathing Bathing position   Position: Bed  Bathing parts Body parts bathed by patient: Right arm, Left arm, Chest, Abdomen, Front  perineal area, Buttocks, Right upper leg, Left upper leg, Right lower leg, Left lower leg, Back Body parts bathed by helper: Back  Bathing assist Assist Level: Supervision or verbal cues      Upper Body Dressing/Undressing Upper body dressing   What is the patient wearing?: Pull over shirt/dress     Pull over shirt/dress - Perfomed by patient: Thread/unthread right sleeve, Thread/unthread left sleeve, Put head through opening, Pull shirt over trunk       Orthosis activity level: Performed by helper [name])  Upper body assist Assist Level: Supervision or verbal cues   Set up : To obtain clothing/put away, To apply TLSO, cervical collar  Lower Body Dressing/Undressing Lower body dressing   What is the patient wearing?: Pants, Shoes     Pants- Performed by patient: Thread/unthread right pants leg, Thread/unthread left pants leg, Pull pants up/down   Non-skid slipper socks- Performed by patient: Don/doff right sock, Don/doff left sock Non-skid slipper socks- Performed by helper: Don/doff left sock Socks - Performed by patient: Don/doff right sock, Don/doff left sock   Shoes - Performed by patient: Don/doff right shoe, Don/doff left shoe Shoes - Performed by helper: Fasten right, Fasten left          Lower body assist Assist for lower body dressing: Supervision or verbal cues      Toileting Toileting   Toileting steps completed by patient: Adjust clothing prior to toileting, Performs perineal hygiene, Adjust clothing after toileting   Toileting Assistive Devices: Grab bar or rail  Toileting assist Assist level: Supervision or verbal cues   Transfers Chair/bed transfer  Chair/bed transfer method: Ambulatory Chair/bed transfer assist level: No Help, no cues, assistive device, takes more than a reasonable amount of time Chair/bed transfer assistive device: Walker, Air cabin crew     Max distance: 100 ft Assist level: No help, No cues,  assistive device, takes more than a reasonable amount of time   Wheelchair Wheelchair activity did not occur: N/A        Cognition Comprehension Comprehension assist level: Follows complex conversation/direction with no assist  Expression Expression assist level: Expresses complex ideas: With no assist  Social Interaction Social Interaction assist level: Interacts appropriately with others - No medications needed.  Problem Solving Problem solving assist level: Solves complex problems: Recognizes & self-corrects  Memory Memory assist level: Complete Independence: No helper   Medical Problem List and Plan: 1.  Weakness, gait abnormality, balance deficits secondary to Lumbar discitis with radiculopathy.  -dc home today  -HH followup RN, PT  -Patient to see me in the office for transitional care encounter in 1-2 weeks.  2.  DVT Prophylaxis/Anticoagulation: SCD 3. Pain Management:   -dc home on prn oxycodone---will help with pain mgt once home 4. Mood: Team to provide ego support to help with adjustment reaction. LCSW to follow for evaluation and support.  5. Neuropsych: This patient is capable of making decisions on his own behalf. 6. Skin/Wound Care: Monitor wound daily for healing. Maintain adequate nutritional and hydration status.   -fungal rash resolved  -surgical wound healed 7. Fluids/Electrolytes/Nutrition: good po  -repleting potassium, K+ 3.9 on 11/26 8. Diskitis with osteomyelitis: Vancomycin/Ceftazidime D # 13/ 56+             -outpt renal follow up  [facility] for IV abx   9.  BPH: Voiding well.  10. ABLA:   hgb up to 8.7 on 11/26     -Fe+ supp, 11. Constipation: managed with senna and miralax on board.  LOS (Days) 8 A FACE TO FACE EVALUATION WAS PERFORMED  Donald [name] T 08/08/2016 8:25 AM

## 2016-08-08 NOTE — Discharge Instructions (Signed)
Inpatient Rehab Discharge Instructions  Donald [name] Discharge date and time:  08/08/16  Activities/Precautions/ Functional Status: Activity: no lifting, driving, or strenuous exercise till cleared by MD Diet: regular diet Wound Care: keep wound clean and dry   Functional status:  ___ No restrictions     ___ Walk up steps independently ___ 24/7 supervision/assistance   ___ Walk up steps with assistance _X_ Intermittent supervision/assistance  ___ Bathe/dress independently _X__ Walk with walker    _X__ Bathe/dress with assistance ___ Walk Independently    ___ Shower independently ___ Walk with assistance    ___ Shower with assistance _X__ No alcohol     ___ Return to work/school ________      COMMUNITY REFERRALS UPON DISCHARGE:    Home Health:   PT     OT    RN                      Agency:  [facility] Phone: [phone]    Special Instructions: 1. Wear brace before getting out of bed. 2. Maintain pressure relief measures when in bed. Elevate feet off pillows. Wear shoes when ambulating. 3. Take nutritional supplements twice a day between meals.  4. Can use Miralax 1-2 times a day additionally laxative if no BM X 24 hours. Can also use fleets enema if needed.   My questions have been answered and I understand these instructions. I will adhere to these goals and the provided educational materials after my discharge from the hospital.  Patient/Caregiver Signature _______________________________ Date __________  Clinician Signature _______________________________________ Date __________  Please bring this form and your medication list with you to all your follow-up doctor's appointments.

## 2016-08-09 ENCOUNTER — Telehealth: Payer: Self-pay

## 2016-08-09 NOTE — Telephone Encounter (Addendum)
   Transitional Care call - Donald [name]   1. Are you/is patient experiencing any problems since coming home? No Are there any questions regarding any aspect of care? No 2. Are there any questions regarding medications administration/dosing? No Are meds being taken as prescribed? Yes Patient should review meds with caller to confirm 3. Have there been any falls? No 4. Has Home Health been to the house and/or have they contacted you? Yes 5. Are bowels and bladder emptying properly? Yes Are there any unexpected incontinence issues? No 6. Any fevers, problems with breathing, unexpected pain? No 7. Are there any skin problems or new areas of breakdown? No 8. Has the patient/family member arranged specialty MD follow up (ie cardiology/neurology/renal/surgical/etc)  Yes 9. Does the patient need any other services or support that we can help arrange? No 10. Are caregivers following through as expected in assisting the patient? Yes 11. Has the patient quit smoking, drinking alcohol, or using drugs as recommended? Patient doesn't do drugs, use tobacco products or alcohol.   Appointment time conflicted with another appointment patient will call once home health leaves to set up a different appointment date and time

## 2016-08-10 ENCOUNTER — Inpatient Hospital Stay: Payer: Medicare Other | Admitting: Infectious Diseases

## 2016-08-15 ENCOUNTER — Encounter: Payer: Medicare Other | Admitting: Physical Medicine & Rehabilitation

## 2016-08-24 ENCOUNTER — Encounter: Payer: Self-pay | Admitting: Family Medicine

## 2016-08-24 ENCOUNTER — Ambulatory Visit (INDEPENDENT_AMBULATORY_CARE_PROVIDER_SITE_OTHER): Payer: Medicare Other | Admitting: Family Medicine

## 2016-08-24 VITALS — BP 136/84 | HR 72 | Temp 98.3°F | Resp 16 | Ht 73.0 in | Wt 201.1 lb

## 2016-08-24 DIAGNOSIS — M4646 Discitis, unspecified, lumbar region: Secondary | ICD-10-CM

## 2016-08-24 DIAGNOSIS — D649 Anemia, unspecified: Secondary | ICD-10-CM

## 2016-08-24 DIAGNOSIS — E876 Hypokalemia: Secondary | ICD-10-CM | POA: Diagnosis not present

## 2016-08-24 DIAGNOSIS — G4700 Insomnia, unspecified: Secondary | ICD-10-CM | POA: Diagnosis not present

## 2016-08-24 MED ORDER — ZOLPIDEM TARTRATE 10 MG PO TABS: ORAL_TABLET | ORAL | 0 refills | Status: DC

## 2016-08-24 NOTE — Patient Instructions (Addendum)
  I will check into results from previous blood draws for your hemoglobin and potassium levels.  Continue Ambien if needed for sleep, but if you are feeling depression symptoms as we discussed, let me know. Please contact me through Mychart if needed, or if you need me to call you, please let the staff know or let me know on MyChart.    IF you received an x-ray today, you will receive an invoice from [facility]. Please contact [facility] at [phone] with questions or concerns regarding your invoice.   IF you received labwork today, you will receive an invoice from [facility]. Please contact [facility] at [phone] with questions or concerns regarding your invoice.   Our billing staff will not be able to assist you with questions regarding bills from these companies.  You will be contacted with the lab results as soon as they are available. The fastest way to get your results is to activate your My Chart account. Instructions are located on the last page of this paperwork. If you have not heard from [place] regarding the results in 2 weeks, please contact this office.

## 2016-08-24 NOTE — Progress Notes (Signed)
By signing my name below I, Tereasa Coop, attest that this documentation has been prepared under the direction and in the presence of [name], MD. Electonically Signed. Tereasa Coop, Scribe 08/24/2016 at 1:10 PM  Subjective:    Patient ID: Donald [name], male    DOB: 02/06/1941, 74 y.o.   MRN: [id number]  Chief Complaint  Patient presents with  . Follow-up    hospital IP for back surgery 07/25/2016 - [place]    HPI Donald [name] is a 75 y.o. male who presents to the [facility] for follow up reevaluation of his back pain. Note that pt was seen by Dr Carlota Raspberry on 07/20/16 for 3 days worsening low back pain. At that visit pt was reporting pain too be worst pain in his life. Also reported that pain was radiating down to his foot. Was sent to and admitted to [facility] on 07/20/16. Pt had a posterior L3-S1 lumbar fusion surgery on 07/25/16 by Dr Lynann Bologna and was discharged on 07/31/16 from inpatient rehab. Pt sent home with vancomycin and ceftazidime through a picc line once a week by home health nurse. Followed by infectious disease and plan for those antibiotics for 8 weeks total. Recently seen by orthopaedics and was improving at that time. Pt is walking with a walker.  Nutritional supplements recommended before meals. Wearing brace before getting out of bed. Pressure relief measures in bed. Did have previous hypokalemia of 3.3 on 08/01/16 which normalized on most recent blood work on 08/06/16. Home health PT once a week.   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 Pt underwent lumbar wound irrigation and discharged home on 06/29/16.  Pt states that he feels like he is improving steadily. Denies feeling depressed or suicidal about his situation. Pt reports feeling mad about his bad luck and his back pain and need for surgery. Pt also reports  that he has become increasingly more social, which has helped his mood. Also reports that he has been reading and meditating more than prior to the surgeries. Denies having any complications with his picc line. Pt reports that he has been having weekly blood work by his home health nurse.   Pt has been taking Ambien nightly to help him sleep and is requesting a prescription refill. Pt states that his genital rash has been improving. Pt denying any fever, chills, diaphoresis, or N/V/D.   Patient Active Problem List   Diagnosis Date Noted  . Post-operative pain   . Lumbar radiculopathy 08/01/2016  . 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 Discitis   . History of lumbar surgery   . Surgery, elective   . Neurologic gait disorder   . Chronic pain syndrome   . Benign prostatic hyperplasia   . Acute blood loss anemia   . Constipation due to pain medication   . Paraparesis ([place])   . Microcytic anemia 07/21/2016  . Unintentional weight loss 07/21/2016  . Status post lumbar laminectomy 07/21/2016  . Spinal stenosis of lumbar region 07/21/2016  . Abscess in epidural space of lumbar spine   . Candidiasis   . Osteomyelitis of lumbar spine ([place]) 07/20/2016  . Propionibacterium infection   . 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 Discitis of lumbar region 06/29/2016  . Bilateral lower extremity pain   . Radicular pain 09/18/2013  . Hyperlipidemia 10/20/2011  . Insomnia 10/20/2011  . Prostate  cancer ([place]) 10/20/2011  . Personal history of colonic polyps 12/16/2010   Past Medical History:  Diagnosis Date  . Adenomatous polyps 10/2002, 06/2009  . Arthritis   . BPH (benign prostatic hyperplasia)   . Hyperlipemia   . Prostate cancer ([place])   . Spinal stenosis   . Wears glasses    Past Surgical History:  Procedure Laterality Date  . ANTERIOR CERVICAL DECOMP/DISCECTOMY FUSION N/A 09/18/2013   Procedure: Anterior cervical decompression fusion, cervical 4-5, cervical 5-6, cervical 6-7 with  instrumentation and allograft;  Surgeon: Sinclair Ship, MD;  Location: [place];  Service: Orthopedics;  Laterality: N/A;  Anterior cervical decompression fusion, cervical 4-5, cervical 5-6, cervical 6-7 with instrumentation and allograft  . COLONOSCOPY    . COLONOSCOPY W/ POLYPECTOMY  11/04/2002   Sigmoid adenomas x2, 1 cm maximum  . COLONOSCOPY W/ POLYPECTOMY  06/08/2009   6 polyps, largest 8 mm, at least 3 adenomas  . CYSTOSCOPY  04/2010  . ESOPHAGOGASTRODUODENOSCOPY  11/04/2002   Duodenitis, gastritis without H. pylori   . HIP ARTHROPLASTY  04/2010   Left - Dr. Mayer Camel, right 2012  . INCISION AND DRAINAGE OF WOUND N/A 07/25/2016   Procedure: IRRIGATION AND DEBRIDEMENT LUMBAR DISCITIS;  Surgeon: Phylliss Bob, MD;  Location: [place];  Service: Orthopedics;  Laterality: N/A;  . JOINT REPLACEMENT    . LUMBAR LAMINECTOMY/DECOMPRESSION MICRODISCECTOMY N/A 03/30/2016   Procedure: LUMBAR 3-4, LUMBAR 4-5 DECOMPRESSION ;  Surgeon: Phylliss Bob, MD;  Location: [place];  Service: Orthopedics;  Laterality: N/A;  LUMBAR 3-4, LUMBAR 4-5 DECOMPRESSION   . LUMBAR WOUND DEBRIDEMENT N/A 06/29/2016   Procedure: LUMBAR WOUND EXPLORATION AND IRRIGATION AND  DEBRIDEMENT;  Surgeon: Phylliss Bob, MD;  Location: [place];  Service: Orthopedics;  Laterality: N/A;  . PROSTATE SURGERY    . RETROPUBIC PROSTATECTOMY  09/2003  . TONSILLECTOMY    . UPPER GASTROINTESTINAL ENDOSCOPY     Allergies  Allergen Reactions  . Iodine Anaphylaxis  . Ivp Dye [Iodinated Diagnostic Agents] Anaphylaxis   Prior to Admission medications   Medication Sig Start Date End Date Taking? Authorizing Provider  acetaminophen (TYLENOL) 325 MG tablet Take 1-2 tablets (325-650 mg total) by mouth every 4 (four) hours as needed for mild pain. 08/08/16  Yes Ivan Anchors Love, PA-C  cefTAZidime 2 g in dextrose 5 % 50 mL Inject 2 g into the vein every 8 (eight) hours. 08/08/16  Yes Ivan Anchors Love, PA-C  cyclobenzaprine (FLEXERIL) 5 MG tablet Take 1 tablet  (5 mg total) by mouth 3 (three) times daily as needed for muscle spasms. 08/08/16  Yes Ivan Anchors Love, PA-C  ferrous sulfate 325 (65 FE) MG tablet Take 1 tablet (325 mg total) by mouth 2 (two) times daily with a meal. 08/08/16  Yes Ivan Anchors Love, PA-C  Multiple Vitamin (MULTIVITAMIN) capsule Take 1 capsule by mouth daily.     Yes Historical Provider, MD  nystatin (MYCOSTATIN/NYSTOP) powder Apply topically 2 (two) times daily. 08/08/16  Yes Ivan Anchors Love, PA-C  oxyCODONE-acetaminophen (PERCOCET/ROXICET) 5-325 MG tablet Take 1-2 tablets by mouth every 8 (eight) hours as needed for severe pain. 08/08/16  Yes Ivan Anchors Love, PA-C  potassium chloride SA (K-DUR,KLOR-CON) 20 MEQ tablet Take 1 tablet (20 mEq total) by mouth daily. 08/09/16  Yes Ivan Anchors Love, PA-C  vancomycin 1,750 mg in sodium chloride 0.9 % 500 mL Inject 1,750 mg into the vein daily. 08/08/16  Yes Ivan Anchors Love, PA-C  zolpidem (AMBIEN) 10 MG tablet TAKE 1/2 TABLET AT  BEDTIME AS NEEDED FOR SLEEP. 05/16/16  Yes [name], MD  alum & mag hydroxide-simeth (MAALOX/MYLANTA) 200-200-20 MG/5ML suspension Take 30 mLs by mouth every 6 (six) hours as needed for indigestion. Patient not taking: Reported on 08/24/2016 07/31/16   Mariel Aloe, MD  polyethylene glycol [facility] / GLYCOLAX) packet Take 17 g by mouth 2 (two) times daily. Patient not taking: Reported on 08/24/2016 07/31/16   Mariel Aloe, MD  senna-docusate (SENOKOT-S) 8.6-50 MG tablet Take 1 tablet by mouth 2 (two) times daily. Patient not taking: Reported on 08/24/2016 07/31/16   Mariel Aloe, MD   Social History   Social History  . Marital status: Married    Spouse name: N/A  . Number of children: N/A  . Years of education: N/A   Occupational History  . Retired     [name]   Social History Main Topics  . Smoking status: Former Smoker    Types: Cigars    Quit date: 09/12/1972  . Smokeless tobacco: Never Used  . Alcohol use 1.2 oz/week    2 Cans of beer per  week     Comment: 1-2 beers a week  . Drug use: No  . Sexual activity: Yes     Comment: number of sex partners in the last months 1, current birth control method - no prostate   Other Topics Concern  . Not on file   Social History Narrative   Exercise do Tai Chi daily and walking   Married   Education: Theme park manager; Yes      Review of Systems  Constitutional: Negative for chills and fever.  Musculoskeletal: Positive for back pain.  Skin: Positive for rash (improving genital rash).  Psychiatric/Behavioral: Positive for agitation (As in HPI, Pt feels frustrated/angry about his back problems). Negative for suicidal ideas.       Objective:   Physical Exam  Constitutional: He is oriented to person, place, and time. He appears well-developed and well-nourished.  HENT:  Head: Normocephalic and atraumatic.  Eyes: EOM are normal. Pupils are equal, round, and reactive to light.  Neck: No JVD present. Carotid bruit is not present.  Cardiovascular: Normal rate, regular rhythm and normal heart sounds.   No murmur heard. Pulmonary/Chest: Effort normal and breath sounds normal. He has no rales.  Musculoskeletal: He exhibits no edema.  Minimal lower extremity edema.   Neurological: He is alert and oriented to person, place, and time.  Skin: Skin is warm and dry.  Psychiatric: He has a normal mood and affect.  Vitals reviewed.    Vitals:   08/24/16 1154  BP: 136/84  Pulse: 72  Resp: 16  Temp: 98.3 F (36.8 C)  TempSrc: Oral  SpO2: 96%  Weight: 201 lb 1.6 oz (91.2 kg)  Height: _0  (1.854 m)        Assessment & Plan:   Donald [name] is a 75 y.o. male Discitis of lumbar region  -Stable, improving after hospitalization, surgery, and inpatient rehabilitation. Continue follow-up with orthopedist, therapist, and infectious disease regarding IV antibiotics.. His labs are being followed by infectious disease.  Insomnia, unspecified type - Plan: zolpidem (AMBIEN) 10  MG tablet  -Long-standing intermittent issue, maybe more difficult recently with surgeries, infection, and situational stress with recurrent hospitalizations. Denied depression, suspect some component of adjustment with recent illness. RTC precautions discussed if depression symptoms, or to let me know by my chart if he has other concerns or questions. I also advised him that  I can call on the phone if that would be needed.  Ambien refilled, use lowest effective dose.  Anemia, unspecified type  -Improved based on review of outside labs, this is also been monitored by infectious disease on their labs, but again his hemoglobin was improved when reviewed on outside labs, see scanned copy.  Hypokalemia  -Stable on most recent blood draw. See scanned copy, monitored by intermittent blood draws for monitoring on IV antibiotics.  Meds ordered this encounter  Medications  . zolpidem (AMBIEN) 10 MG tablet    Sig: TAKE 1/2 TABLET AT BEDTIME AS NEEDED FOR SLEEP.    Dispense:  45 tablet    Refill:  0   Patient Instructions    I will check into results from previous blood draws for your hemoglobin and potassium levels.  Continue Ambien if needed for sleep, but if you are feeling depression symptoms as we discussed, let me know. Please contact me through Mychart if needed, or if you need me to call you, please let the staff know or let me know on MyChart.    IF you received an x-ray today, you will receive an invoice from [facility]. Please contact [facility] at [phone] with questions or concerns regarding your invoice.   IF you received labwork today, you will receive an invoice from [place]. Please contact [facility] at [phone] with questions or concerns regarding your invoice.   Our billing staff will not be able to assist you with questions regarding bills from these companies.  You will be contacted with the lab results as soon as they are available. The fastest way  to get your results is to activate your My Chart account. Instructions are located on the last page of this paperwork. If you have not heard from [place] regarding the results in 2 weeks, please contact this office.

## 2016-08-28 ENCOUNTER — Encounter: Payer: Self-pay | Admitting: Internal Medicine

## 2016-08-29 ENCOUNTER — Ambulatory Visit (INDEPENDENT_AMBULATORY_CARE_PROVIDER_SITE_OTHER): Payer: Medicare Other | Admitting: Internal Medicine

## 2016-08-29 ENCOUNTER — Other Ambulatory Visit: Payer: Self-pay | Admitting: Physical Medicine and Rehabilitation

## 2016-08-29 ENCOUNTER — Encounter: Payer: Self-pay | Admitting: Internal Medicine

## 2016-08-29 VITALS — BP 147/83 | HR 66 | Temp 98.3°F | Ht 72.0 in | Wt 201.0 lb

## 2016-08-29 DIAGNOSIS — M4640 Discitis, unspecified, site unspecified: Secondary | ICD-10-CM

## 2016-08-29 NOTE — Progress Notes (Signed)
RFV: discitis  Patient ID: Donald [name], male   DOB: 09-28-1940, 75 y.o.   MRN: [id number]  HPI  Has hx of complication after back surgery. 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 His OR culture was suggestive of multipe organisms, thus he was placed on vancomycin and ceftax to treat for 6- 8wk. He is now day 37 of abtx since his last surgery  His labs from 12/18 shows cr of 1.2 up from 1.12 and sed rate of 9, vanco trough of 18.5 continues on vanco and fortaz  Outpatient Encounter Prescriptions as of 08/29/2016  Medication Sig  . acetaminophen (TYLENOL) 325 MG tablet Take 1-2 tablets (325-650 mg total) by mouth every 4 (four) hours as needed for mild pain.  . cefTAZidime 2 g in dextrose 5 % 50 mL Inject 2 g into the vein every 8 (eight) hours.  . ferrous sulfate 325 (65 FE) MG tablet Take 1 tablet (325 mg total) by mouth 2 (two) times daily with a meal.  . Multiple Vitamin (MULTIVITAMIN) capsule Take 1 capsule by mouth daily.    . vancomycin 1,750 mg in sodium chloride 0.9 % 500 mL Inject 1,750 mg into the vein daily.  Marland Kitchen zolpidem (AMBIEN) 10 MG tablet TAKE 1/2 TABLET AT BEDTIME AS NEEDED FOR SLEEP.  . [DISCONTINUED] alum & mag hydroxide-simeth (MAALOX/MYLANTA) 200-200-20 MG/5ML suspension Take 30 mLs by mouth every 6 (six) hours as needed for indigestion.  . [DISCONTINUED] cyclobenzaprine (FLEXERIL) 5 MG tablet Take 1 tablet (5 mg total) by mouth 3 (three) times daily as needed for muscle spasms.  Marland Kitchen nystatin (MYCOSTATIN/NYSTOP) powder Apply topically 2 (two) times daily. (Patient not taking: Reported on 08/29/2016)  . oxyCODONE-acetaminophen (PERCOCET/ROXICET) 5-325 MG tablet Take 1-2 tablets by mouth every 8 (eight) hours as needed for  severe pain. (Patient not taking: Reported on 08/29/2016)  . potassium chloride SA (K-DUR,KLOR-CON) 20 MEQ tablet Take 1 tablet (20 mEq total) by mouth daily.  Marland Kitchen senna-docusate (SENOKOT-S) 8.6-50 MG tablet Take 1 tablet by mouth 2 (two) times daily. (Patient not taking: Reported on 08/24/2016)  . [DISCONTINUED] polyethylene glycol (MIRALAX / GLYCOLAX) packet Take 17 g by mouth 2 (two) times daily. (Patient not taking: Reported on 08/24/2016)   No facility-administered encounter medications on file as of 08/29/2016.      Patient Active Problem List   Diagnosis Date Noted  . Post-operative pain   . Lumbar radiculopathy 08/01/2016  . 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 Discitis   . History of lumbar surgery   . Surgery, elective   . Neurologic gait disorder   . Chronic pain syndrome   . Benign prostatic hyperplasia   . Acute blood loss anemia   . Constipation due to pain medication   . Paraparesis ([place])   . Microcytic anemia 07/21/2016  . Unintentional weight loss 07/21/2016  . Status post lumbar laminectomy 07/21/2016  . Spinal stenosis of lumbar region 07/21/2016  . Abscess in epidural space of lumbar spine   . Candidiasis   . Osteomyelitis of lumbar spine ([place]) 07/20/2016  . Propionibacterium infection   . 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 Discitis of lumbar region 06/29/2016  . Bilateral lower extremity pain   . Radicular pain 09/18/2013  . Hyperlipidemia 10/20/2011  . Insomnia 10/20/2011  . Prostate  cancer ([place]) 10/20/2011  . Personal history of colonic polyps 12/16/2010     There are no preventive care reminders to display for this patient.   Review of Systems  Physical Exam   BP (!) 147/83   Pulse 66   Temp 98.3 F (36.8 C) (Oral)   Ht 6' (1.829 m)   Wt 201 lb (91.2 kg)   BMI 27.26 kg/m  Physical Exam  Constitutional: He is oriented to person, place, and time. He appears well-developed and well-nourished. No distress.  HENT:  Mouth/Throat:  Oropharynx is clear and moist. No oropharyngeal exudate.  Cardiovascular: Normal rate, regular rhythm and normal heart sounds. Exam reveals no gallop and no friction rub.  No murmur heard.  Pulmonary/Chest: Effort normal and breath sounds normal. No respiratory distress. He has no wheezes.  Abdominal: Soft. Bowel sounds are normal. He exhibits no distension. There is no tenderness.  Lymphadenopathy:  He has no cervical adenopathy.  Neurological: He is alert and oriented to person, place, and time.  Skin: Skin is warm and dry. No rash noted. No erythema.  Psychiatric: He has a normal mood and affect. His behavior is normal.     CBC Lab Results  Component Value Date   WBC 5.8 08/06/2016   RBC 3.46 (L) 08/06/2016   HGB 8.7 (L) 08/06/2016   HCT 28.0 (L) 08/06/2016   PLT 236 08/06/2016   MCV 80.9 08/06/2016   MCH 25.1 (L) 08/06/2016   MCHC 31.1 08/06/2016   RDW 17.7 (H) 08/06/2016   LYMPHSABS 1.1 08/01/2016   MONOABS 0.5 08/01/2016   EOSABS 0.8 (H) 08/01/2016   BASOSABS 0.0 08/01/2016   BMET Lab Results  Component Value Date   NA 143 08/06/2016   K 3.9 08/06/2016   CL 109 08/06/2016   CO2 28 08/06/2016   GLUCOSE 97 08/06/2016   BUN 9 08/06/2016   CREATININE 0.90 08/06/2016   CALCIUM 8.5 (L) 08/06/2016   GFRNONAA >60 08/06/2016   GFRAA >60 08/06/2016     Assessment and Plan  Post op infection = polymicrobial on gram stain nothing isolated but on original wash out, he had proprinobacterium isolated. We will give him 2 wk of amox/clav by mouth to finish out course. His Sed rate has normalized but CRP still midlyl elevated.

## 2016-08-31 ENCOUNTER — Encounter: Payer: Self-pay | Admitting: Internal Medicine

## 2016-08-31 ENCOUNTER — Other Ambulatory Visit: Payer: Self-pay | Admitting: Physical Medicine and Rehabilitation

## 2016-09-01 MED ORDER — AMOXICILLIN-POT CLAVULANATE 875-125 MG PO TABS: 1.0000 | ORAL_TABLET | Freq: Two times a day (BID) | ORAL | 0 refills | Status: DC

## 2016-09-07 LAB — ACID FAST CULTURE WITH REFLEXED SENSITIVITIES: ACID FAST CULTURE - AFSCU3: NEGATIVE

## 2016-09-12 DIAGNOSIS — M48062 Spinal stenosis, lumbar region with neurogenic claudication: Secondary | ICD-10-CM | POA: Diagnosis not present

## 2016-09-14 ENCOUNTER — Encounter: Payer: Self-pay | Admitting: Internal Medicine

## 2016-09-18 ENCOUNTER — Encounter: Payer: Self-pay | Admitting: *Deleted

## 2016-09-18 ENCOUNTER — Other Ambulatory Visit: Payer: Self-pay | Admitting: Internal Medicine

## 2016-09-20 DIAGNOSIS — R262 Difficulty in walking, not elsewhere classified: Secondary | ICD-10-CM | POA: Diagnosis not present

## 2016-09-20 DIAGNOSIS — M4326 Fusion of spine, lumbar region: Secondary | ICD-10-CM | POA: Diagnosis not present

## 2016-09-27 ENCOUNTER — Encounter: Payer: Self-pay | Admitting: Internal Medicine

## 2016-09-28 ENCOUNTER — Ambulatory Visit: Payer: Medicare Other | Admitting: Internal Medicine

## 2016-09-28 ENCOUNTER — Encounter: Payer: Self-pay | Admitting: Internal Medicine

## 2016-09-29 LAB — ACID FAST CULTURE WITH REFLEXED SENSITIVITIES (MYCOBACTERIA): Acid Fast Culture: NEGATIVE

## 2016-10-05 ENCOUNTER — Telehealth: Payer: Self-pay | Admitting: *Deleted
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
## 2016-10-05 NOTE — Telephone Encounter (Signed)
Can you have him go to [facility] tomorrow to get labs. To get sed rate and crp and we can see if need to restart on abtx

## 2016-10-17 ENCOUNTER — Encounter: Payer: Self-pay | Admitting: Family Medicine

## 2016-10-24 DIAGNOSIS — M4326 Fusion of spine, lumbar region: Secondary | ICD-10-CM | POA: Diagnosis not present

## 2016-10-26 ENCOUNTER — Encounter: Payer: Self-pay | Admitting: Internal Medicine

## 2016-10-26 DIAGNOSIS — M6281 Muscle weakness (generalized): Secondary | ICD-10-CM | POA: Diagnosis not present

## 2016-10-26 DIAGNOSIS — M545 Low back pain: Secondary | ICD-10-CM | POA: Diagnosis not present

## 2016-10-26 DIAGNOSIS — M4326 Fusion of spine, lumbar region: Secondary | ICD-10-CM | POA: Diagnosis not present

## 2016-11-08 DIAGNOSIS — H5203 Hypermetropia, bilateral: Secondary | ICD-10-CM | POA: Diagnosis not present

## 2016-11-08 DIAGNOSIS — H524 Presbyopia: Secondary | ICD-10-CM | POA: Diagnosis not present

## 2016-11-08 DIAGNOSIS — H2513 Age-related nuclear cataract, bilateral: Secondary | ICD-10-CM | POA: Diagnosis not present

## 2016-11-13 ENCOUNTER — Ambulatory Visit (INDEPENDENT_AMBULATORY_CARE_PROVIDER_SITE_OTHER): Payer: PPO | Admitting: Internal Medicine

## 2016-11-13 ENCOUNTER — Encounter: Payer: Self-pay | Admitting: Internal Medicine

## 2016-11-13 VITALS — BP 141/61 | HR 56 | Temp 98.3°F | Wt 201.1 lb

## 2016-11-13 DIAGNOSIS — M4645 Discitis, unspecified, thoracolumbar region: Secondary | ICD-10-CM

## 2016-11-13 DIAGNOSIS — C61 Malignant neoplasm of prostate: Secondary | ICD-10-CM | POA: Diagnosis not present

## 2016-11-13 DIAGNOSIS — Z0000 Encounter for general adult medical examination without abnormal findings: Secondary | ICD-10-CM

## 2016-11-13 LAB — PSA

## 2016-11-13 NOTE — Progress Notes (Signed)
Patient ID: Donald [name], male   DOB: Nov 29, 1940, 76 y.o.   MRN: [id number]  HPI hx of complication after back surgery. 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 His OR culture was suggestive of multipe organisms, thus he was placed on vancomycin and ceftax to treat for 6- 8wk and placed on a few weeks of oral abtx since mildly elevated crp.  His labs from 12/18 shows cr of 1.2 up from 1.12 and sed rate of 9, vanco trough of 18.5 continues on vanco and fortaz  He has been off of meds since fist week of january  Outpatient Encounter Prescriptions as of 11/13/2016  Medication Sig  . acetaminophen (TYLENOL) 325 MG tablet Take 1-2 tablets (325-650 mg total) by mouth every 4 (four) hours as needed for mild pain.  Marland Kitchen amoxicillin-clavulanate (AUGMENTIN) 875-125 MG tablet Take 1 tablet by mouth 2 (two) times daily.  . cefTAZidime 2 g in dextrose 5 % 50 mL Inject 2 g into the vein every 8 (eight) hours.  . ferrous sulfate 325 (65 FE) MG tablet Take 1 tablet (325 mg total) by mouth 2 (two) times daily with a meal.  . Multiple Vitamin (MULTIVITAMIN) capsule Take 1 capsule by mouth daily.    Marland Kitchen nystatin (MYCOSTATIN/NYSTOP) powder Apply topically 2 (two) times daily. (Patient not taking: Reported on 08/29/2016)  . oxyCODONE-acetaminophen (PERCOCET/ROXICET) 5-325 MG tablet Take 1-2 tablets by mouth every 8 (eight) hours as needed for severe pain. (Patient not taking: Reported on 08/29/2016)  . potassium chloride SA (K-DUR,KLOR-CON) 20 MEQ tablet TAKE 1 TABLET DAILY.  Marland Kitchen senna-docusate (SENOKOT-S) 8.6-50 MG tablet Take 1 tablet by mouth 2 (two) times daily. (Patient not taking: Reported on 08/24/2016)  . vancomycin 1,750 mg in sodium chloride 0.9 % 500 mL  Inject 1,750 mg into the vein daily.  Marland Kitchen zolpidem (AMBIEN) 10 MG tablet TAKE 1/2 TABLET AT BEDTIME AS NEEDED FOR SLEEP.   No facility-administered encounter medications on file as of 11/13/2016.      Patient Active Problem List   Diagnosis Date Noted  . Post-operative pain   . Lumbar radiculopathy 08/01/2016  . 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 Discitis   . History of lumbar surgery   . Surgery, elective   . Neurologic gait disorder   . Chronic pain syndrome   . Benign prostatic hyperplasia   . Acute blood loss anemia   . Constipation due to pain medication   . Paraparesis ([place])   . Microcytic anemia 07/21/2016  . Unintentional weight loss 07/21/2016  . Status post lumbar laminectomy 07/21/2016  . Spinal stenosis of lumbar region 07/21/2016  . Abscess in epidural space of lumbar spine   . Candidiasis   . Osteomyelitis of lumbar spine ([place]) 07/20/2016  . Propionibacterium infection   . 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 Discitis of lumbar region 06/29/2016  . Bilateral lower extremity pain   . Radicular pain 09/18/2013  . Hyperlipidemia 10/20/2011  . Insomnia 10/20/2011  . Prostate cancer ([place]) 10/20/2011  . Personal history of colonic polyps 12/16/2010     There are no preventive care reminders to display for this patient.   Review of Systems + 30 lb loss since hospitalization that he has not gained back, but also has had diet and exercise changes to  help keep weight off. Otherwise mild back pain, deconditioning. No other ROS Physical Exam   BP (!) 141/61 (BP Location: Left Arm, Patient Position: Sitting)   Pulse (!) 56   Temp 98.3 F (36.8 C) (Oral)   Wt 201 lb 1.9 oz (91.2 kg)   BMI 27.28 kg/m   Physical Exam  Constitutional: He is oriented to person, place, and time. He appears well-developed and well-nourished. No distress.  HENT:  Mouth/Throat: Oropharynx is clear and moist. No oropharyngeal exudate.  Cardiovascular: Normal rate, regular rhythm  and normal heart sounds. Exam reveals no gallop and no friction rub.  No murmur heard.  Pulmonary/Chest: Effort normal and breath sounds normal. No respiratory distress. He has no wheezes.  Abdominal: Soft. Bowel sounds are normal. He exhibits no distension. There is no tenderness.  Lymphadenopathy:  He has no cervical adenopathy.  Neurological: He is alert and oriented to person, place, and time.  Skin: Skin is warm and dry. Back incision well healed Psychiatric: He has a normal mood and affect. His behavior is normal.    CBC Lab Results  Component Value Date   WBC 5.8 08/06/2016   RBC 3.46 (L) 08/06/2016   HGB 8.7 (L) 08/06/2016   HCT 28.0 (L) 08/06/2016   PLT 236 08/06/2016   MCV 80.9 08/06/2016   MCH 25.1 (L) 08/06/2016   MCHC 31.1 08/06/2016   RDW 17.7 (H) 08/06/2016   LYMPHSABS 1.1 08/01/2016   MONOABS 0.5 08/01/2016   EOSABS 0.8 (H) 08/01/2016    BMET Lab Results  Component Value Date   NA 143 08/06/2016   K 3.9 08/06/2016   CL 109 08/06/2016   CO2 28 08/06/2016   GLUCOSE 97 08/06/2016   BUN 9 08/06/2016   CREATININE 0.90 08/06/2016   CALCIUM 8.5 (L) 08/06/2016   GFRNONAA >60 08/06/2016   GFRAA >60 08/06/2016   Lab Results  Component Value Date   ESRSEDRATE 5 11/13/2016   POCTSEDRATE 75 (A) 06/26/2016   Lab Results  Component Value Date   CRP 9.4 (H) 07/23/2016      Assessment and Plan Hx of lumbar osteo following surgery, likely SSI = he finished prolonged abtx and has been off of abtx for roughly 2 months. We will check his Sed rate and crp to see htat they have normalized.  Addendum = sed rate is WNL  rtc prn  Continue with balanced diet and exercise

## 2016-11-14 LAB — SEDIMENTATION RATE: Sed Rate: 5 mm/hr (ref 0–20)

## 2016-11-14 LAB — C-REACTIVE PROTEIN: CRP: 1.8 mg/L (ref <8.0)

## 2016-11-15 ENCOUNTER — Telehealth: Payer: Self-pay

## 2016-11-15 NOTE — Telephone Encounter (Signed)
Documentation request from [facility] from patient's hospital stay.  Asked my manager if it was okay to access hospital records for this process and she said it was fine. All information was printed and faxed to [facility]

## 2016-11-28 DIAGNOSIS — M545 Low back pain: Secondary | ICD-10-CM | POA: Diagnosis not present

## 2016-11-29 ENCOUNTER — Other Ambulatory Visit: Payer: Self-pay | Admitting: Family Medicine

## 2016-11-29 DIAGNOSIS — G4700 Insomnia, unspecified: Secondary | ICD-10-CM

## 2016-11-29 NOTE — Telephone Encounter (Signed)
Called to [place] 

## 2016-12-05 ENCOUNTER — Encounter: Payer: Self-pay | Admitting: Internal Medicine

## 2016-12-05 ENCOUNTER — Ambulatory Visit (INDEPENDENT_AMBULATORY_CARE_PROVIDER_SITE_OTHER): Payer: PPO | Admitting: Internal Medicine

## 2016-12-05 DIAGNOSIS — Z86010 Personal history of colonic polyps: Secondary | ICD-10-CM | POA: Diagnosis not present

## 2016-12-05 NOTE — Assessment & Plan Note (Signed)
Given discitis problems and still recuperating will defer colonoscopy for at least 1 year

## 2016-12-05 NOTE — Progress Notes (Signed)
   KAMARI BUCH 76 y.o. [birth year] [id number]  Assessment & Plan:   History of colonic polyps Given discitis problems and still recuperating will defer colonoscopy for at least 1 year  I appreciate the opportunity to care for this patient. CC: [name], MD    Subjective:   Chief Complaint: History of colon polyps question time of colonoscopy  HPI The patient has a wasn't 76 year old man with a history of adenomatous colon polyps, last had 3 polyps removed 2 recovered which were a sessile serrated polyp and an adenoma in 2015. In 2014 he had 9 polyps 8 recovered and all were adenomas or sessile serrated lesions.  He had spinal stenosis surgery in July 6553 complicated by epidural abscess ease and discitis. He is not fully recovered though significantly so and is not wanting to proceed with an elective colonoscopy at this time as he thinks he is still recovering and is just not ready to undergo a procedure.  Medications, allergies, past medical history, past surgical history, family history and social history are reviewed and updated in the EMR.  Review of Systems As above  Objective:   Physical Exam BP 124/72 (BP Location: Left Arm, Patient Position: Sitting, Cuff Size: Normal)   Pulse (!) 56   Ht 5' 11.25" (1.81 m) Comment: height measured without shoes  Wt 206 lb (93.4 kg)   BMI 28.53 kg/m  No acute distress

## 2016-12-05 NOTE — Patient Instructions (Signed)
   I hope the back and other problems completely resolve.  Let's consider another colonoscopy in about 1 year.  I appreciate the opportunity to care for you.  Gatha Mayer, MD, [facility]

## 2016-12-14 ENCOUNTER — Encounter: Payer: Self-pay | Admitting: Family Medicine

## 2016-12-14 ENCOUNTER — Ambulatory Visit (INDEPENDENT_AMBULATORY_CARE_PROVIDER_SITE_OTHER): Payer: PPO | Admitting: Family Medicine

## 2016-12-14 VITALS — BP 128/65 | HR 57 | Temp 98.6°F | Resp 16 | Ht 71.0 in | Wt 204.0 lb

## 2016-12-14 DIAGNOSIS — G8929 Other chronic pain: Secondary | ICD-10-CM | POA: Diagnosis not present

## 2016-12-14 DIAGNOSIS — Z8739 Personal history of other diseases of the musculoskeletal system and connective tissue: Secondary | ICD-10-CM

## 2016-12-14 DIAGNOSIS — J069 Acute upper respiratory infection, unspecified: Secondary | ICD-10-CM | POA: Diagnosis not present

## 2016-12-14 DIAGNOSIS — Z0000 Encounter for general adult medical examination without abnormal findings: Secondary | ICD-10-CM

## 2016-12-14 DIAGNOSIS — Z13220 Encounter for screening for lipoid disorders: Secondary | ICD-10-CM

## 2016-12-14 DIAGNOSIS — M25512 Pain in left shoulder: Secondary | ICD-10-CM | POA: Diagnosis not present

## 2016-12-14 DIAGNOSIS — G4700 Insomnia, unspecified: Secondary | ICD-10-CM | POA: Diagnosis not present

## 2016-12-14 DIAGNOSIS — M25511 Pain in right shoulder: Secondary | ICD-10-CM

## 2016-12-14 DIAGNOSIS — Z131 Encounter for screening for diabetes mellitus: Secondary | ICD-10-CM

## 2016-12-14 NOTE — Progress Notes (Signed)
Subjective:  By signing my name below, I, [name], attest that this documentation has been prepared under the direction and in the presence of [name], MD. Electronically Signed: [name], [place]. 12/14/2016 , 11:59 AM .  Patient was seen in Room 22 .   Patient ID: Donald [name], male    DOB: 1940-11-14, 76 y.o.   MRN: [id number] Chief Complaint  Patient presents with  . Annual Exam   HPI Donald [name] is a 76 y.o. male Here for annual physical, as well as follow up of other medical issues. He has history of HTN, and low back pain with osteomyelitis and abscess of lumbar spine after surgery to lumbar spine, see previous notes. He is followed by [name]. He had spinal surgery in July 2017, subsequently admitted Oct 19th, then Nov 9th, followed by inpatient rehab on Nov 20th. Notes were reviewed from [name] on outpatient treatment. Last seen [name] about a week ago. He's been doing [place] again recently. He has occasional pain, which he takes tylenol for.   Osteomyelitis with abscess He had an abscess located at L4-L5, required debriement of abscess, multiple organisms noted. He was seen by Dr. Baxter [name] with ID on Mar 5th. He was off of antibiotics since first week of Jan. His sed rate was normalized, no further treatment discussed, with follow up PRN with ID.   Hyperlipidemia Lab Results  Component Value Date   CHOL 199 01/07/2016   HDL 43 01/07/2016   LDLCALC 141 (H) 01/07/2016   TRIG 74 01/07/2016   CHOLHDL 4.6 01/07/2016   Lab Results  Component Value Date   ALT 26 08/01/2016   AST 25 08/01/2016   ALKPHOS 62 08/01/2016   BILITOT 0.4 08/01/2016   He's controlled on last check.   Congestion He mentions having cold symptoms ongoing for a week. He states his symptoms are improving. He's been coughing up phlegm.   Cancer Screening Prostate cancer: He has history of prostate cancer. He had a radical prostatectomy in Jan 2005. He's  followed by urologist, Dr. Gaynelle [name].  Lab Results  Component Value Date   PSA <0.1 11/13/2016   PSA <0.01 12/02/2012   PSA <0.01 10/20/2011   Colonoscopy: He was seen by Dr. Carlean [name] on Mar 27th. At that time, he was recommended deferring colonoscopy for at least 1 year. He did have adenoma polyps removed in 2015.   Immunizations Immunization History  Administered Date(s) Administered  . Influenza Split 07/03/2011, 06/25/2012  . Influenza,inj,Quad PF,36+ Mos 06/05/2013, 05/25/2014, 06/12/2016  . Influenza-Unspecified 06/12/2015  . Pneumococcal Conjugate-13 04/09/2015  . Pneumococcal Polysaccharide-23 10/20/2011  . Tdap 03/12/2007  . Zoster 12/03/2012    Depression Depression screen [facility] 2/9 12/14/2016 08/29/2016 08/24/2016 07/20/2016 06/26/2016  Decreased Interest 0 0 0 0 0  Down, Depressed, Hopeless 0 0 0 0 0  PHQ - 2 Score 0 0 0 0 0  Altered sleeping 0 - - - -  Tired, decreased energy 1 - - - -  Change in appetite 0 - - - -  Feeling bad or failure about yourself  0 - - - -  Trouble concentrating 0 - - - -  Moving slowly or fidgety/restless 0 - - - -  Suicidal thoughts 0 - - - -  PHQ-9 Score 1 - - - -   Positive for decreased energy only. 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 He's tolerated ambien in the past,  recommended 7m dose. He doesn't take it every day, average 5 times a week. He denies sleep walking or trouble after taking ambien.   Fall screening He denies any falls in the past year.   Vision  Visual Acuity Screening   Right eye Left eye Both eyes  Without correction:     With correction: 20/20 20/20 20/20    He last saw eye doctor about 1 month ago.   Dentist He last saw dentist about 2-3 weeks ago.   Exercise He walks about 1 mile a day.   Functional Status Survey: Is the patient deaf or have difficulty hearing?: No Does the patient have difficulty seeing, even when wearing glasses/contacts?: No Does  the patient have difficulty concentrating, remembering, or making decisions?: No Does the patient have difficulty walking or climbing stairs?: Yes (hx back surgery. regaining strength. Diff climbing steps difficult) Does the patient have difficulty dressing or bathing?: No Does the patient have difficulty doing errands alone such as visiting a doctor's office or shopping?: No  Advanced Directives He does have advanced directives, without any changes today.   Patient Active Problem List   Diagnosis Date Noted  . Post-operative pain   . Lumbar radiculopathy 08/01/2016  . Paraparesis of both lower limbs ([place] 07/31/2016  . Discitis   . History of lumbar surgery   . Surgery, elective   . Neurologic gait disorder   . Chronic pain syndrome   . Benign prostatic hyperplasia   . Acute blood loss anemia   . Constipation due to pain medication   . Paraparesis ([place]   . Microcytic anemia 07/21/2016  . Unintentional weight loss 07/21/2016  . Status post lumbar laminectomy 07/21/2016  . Spinal stenosis of lumbar region 07/21/2016  . Abscess in epidural space of lumbar spine   . Candidiasis   . Osteomyelitis of lumbar spine ([place] 07/20/2016  . Propionibacterium infection   . 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 Discitis of lumbar region 06/29/2016  . Bilateral lower extremity pain   . Radicular pain 09/18/2013  . Hyperlipidemia 10/20/2011  . Insomnia 10/20/2011  . [place]state cancer ([place] 10/20/2011  . History of colonic polyps 12/16/2010   Past Medical History:  Diagnosis Date  . Adenomatous polyps 10/2002, 06/2009  . Arthritis   . BPH (benign prostatic hyperplasia)   . Hyperlipemia   . Prostate cancer ([facility]   . Spinal stenosis   . Wears glasses    Past Surgical History:  Procedure Laterality Date  . ANTERIOR CERVICAL DECOMP/DISCECTOMY FUSION N/A 09/18/2013   Procedure: Anterior cervical decompression fusion, cervical 4-5, cervical 5-6, cervical 6-7 with instrumentation and allograft;   Surgeon: MSinclair Ship MD;  [facility]  Service: Orthopedics;  Laterality: N/A;  Anterior cervical decompression fusion, cervical 4-5, cervical 5-6, cervical 6-7 with instrumentation and allograft  . COLONOSCOPY    . COLONOSCOPY W/ POLYPECTOMY  11/04/2002   Sigmoid adenomas x2, 1 cm maximum  . COLONOSCOPY W/ POLYPECTOMY  06/08/2009   6 polyps, largest 8 mm, at least 3 adenomas  . CYSTOSCOPY  04/2010  . ESOPHAGOGASTRODUODENOSCOPY  11/04/2002   Duodenitis, gastritis without H. pylori   . HIP ARTHROPLASTY  04/2010   Left - Dr. RMayer Camel right 2012  . INCISION AND DRAINAGE OF WOUND N/A 07/25/2016   Procedure: IRRIGATION AND DEBRIDEMENT LUMBAR DISCITIS;  Surgeon: MPhylliss Bob MD;  [place] [facility]  Service: Orthopedics;  Laterality: N/A;  . JOINT REPLACEMENT    . LUMBAR LAMINECTOMY/DECOMPRESSION MICRODISCECTOMY N/A 03/30/2016   Procedure: LUMBAR  3-4, LUMBAR 4-5 DECOMPRESSION ;  Surgeon: Phylliss Bob, MD;  Location: [place];  Service: Orthopedics;  Laterality: N/A;  LUMBAR 3-4, LUMBAR 4-5 DECOMPRESSION   . LUMBAR WOUND DEBRIDEMENT N/A 06/29/2016   Procedure: LUMBAR WOUND EXPLORATION AND IRRIGATION AND  DEBRIDEMENT;  Surgeon: Phylliss Bob, MD;  Location: [place];  Service: Orthopedics;  Laterality: N/A;  . PROSTATE SURGERY    . RETROPUBIC PROSTATECTOMY  09/2003  . TONSILLECTOMY    . UPPER GASTROINTESTINAL ENDOSCOPY     Allergies  Allergen Reactions  . Iodine Anaphylaxis  . Ivp Dye [Iodinated Diagnostic Agents] Anaphylaxis   Prior to Admission medications   Medication Sig Start Date End Date Taking? Authorizing Provider  Multiple Vitamin (MULTIVITAMIN) capsule Take 1 capsule by mouth daily.     Yes Historical Provider, MD  zolpidem (AMBIEN) 10 MG tablet TAKE 1/2 TABLET AT BEDTIME AS NEEDED FOR SLEEP. 11/29/16  Yes [name], MD   Social History   Social History  . Marital status: Married    Spouse name: N/A  . Number of children: N/A  . Years of education: N/A    Occupational History  . Retired     [name]   Social History Main Topics  . Smoking status: Former Smoker    Types: Cigars    Quit date: 09/12/1972  . Smokeless tobacco: Never Used  . Alcohol use 1.2 oz/week    2 Cans of beer per week     Comment: 1-2 beers a week  . Drug use: No  . Sexual activity: Yes     Comment: number of sex partners in the last months 1, current birth control method - no prostate   Other Topics Concern  . Not on file   Social History Narrative   Exercise do Tai Chi daily and walking   Married   Education: Theme park manager; Yes   Review of Systems  HENT: Positive for congestion, postnasal drip, sneezing and sore throat.   Respiratory: Positive for cough.   Musculoskeletal: Positive for arthralgias.  All other systems reviewed and are negative.      Objective:   Physical Exam  Constitutional: He is oriented to person, place, and time. He appears well-developed and well-nourished.  HENT:  Head: Normocephalic and atraumatic.  Right Ear: External ear normal.  Left Ear: External ear normal.  Mouth/Throat: Oropharynx is clear and moist.  Eyes: Conjunctivae and EOM are normal. Pupils are equal, round, and reactive to light.  Neck: Normal range of motion. Neck supple. No thyromegaly present.  Cardiovascular: Normal rate, regular rhythm, normal heart sounds and intact distal pulses.   Pulmonary/Chest: Effort normal and breath sounds normal. No respiratory distress. He has no wheezes.  Abdominal: Soft. He exhibits no distension. There is no tenderness.  Genitourinary:  Genitourinary Comments: dre deferred as followed by urology.   Musculoskeletal: Normal range of motion. He exhibits no edema or tenderness.  Shoulders: about 90-100 degrees abduction bilaterally  Lymphadenopathy:    He has no cervical adenopathy.  Neurological: He is alert and oriented to person, place, and time. He has normal reflexes.  Skin: Skin is warm and dry.  Well  healed scar over lumbar spine  Psychiatric: He has a normal mood and affect. His behavior is normal.  Vitals reviewed.  Vitals:   12/14/16 1101  BP: 128/65  Pulse: (!) 57  Resp: 16  Temp: 98.6 F (37 C)  TempSrc: Oral  Weight: 204 lb (92.5 kg)  Height: 5' 11"  (1.803  m)      Assessment & Plan:  FITZROY MIKAMI is a 76 y.o. male Medicare annual wellness visit, subsequent  -  - anticipatory guidance as below in AVS, screening labs if needed. Health maintenance items as above in HPI discussed/recommended as applicable.   - no concerning responses on depression, fall, or functional status screening. Any positive responses noted as above. Advanced directives discussed as in CHL.   Acute upper respiratory infection  -Improving. Continue symptomatic care, RTC precautions.  Insomnia, unspecified type  - Stable with Ambien '5mg'$  dosing. Denies any side effects/parasomnias.   Screening for diabetes mellitus - Plan: Comprehensive metabolic panel  Screening for hyperlipidemia - Plan: Lipid panel  Chronic pain of both shoulders  - Continue work on range of motion, but if increasing pain or decreased range of motion, consider evaluation with orthopedist for possible injection.  History of osteomyelitis  - Improving. Continue routine follow-up with orthopedics, HEP, and RTC/ER precautions if acute changes.  No orders of the defined types were placed in this encounter.  Patient Instructions   Use lowest effective dose of Ambien or not at all if possible to sleep.   Current symptoms likely due to a viral infection. See information below on upper respiratory infections. If you're short of breath, fevers, or cold symptoms or not continuing to improve in the next 5-7 days, return for recheck.  Continue routine follow-up with your back specialist, no changes from my standpoint at this time.  I will check your kidney, liver cholesterol test.  Shoulder pain likely due to arthritis, but you  have some decreased range of motion. If any worsening of range of motion, or increased stiffness, that can sometimes be from a frozen shoulder and would recommend evaluation with orthopedics for possible injection, physical therapy, or other treatments. Okay to take Tylenol if needed.  Keeping you healthy  Get these tests  Blood pressure- Have your blood pressure checked once a year by your healthcare provider.  Normal blood pressure is 120/80  Weight- Have your body mass index (BMI) calculated to screen for obesity.  BMI is a measure of body fat based on height and weight. You can also calculate your own BMI at ViewBanking.si.  Cholesterol- Have your cholesterol checked every year.  Diabetes- Have your blood sugar checked regularly if you have high blood pressure, high cholesterol, have a family history of diabetes or if you are overweight.  Screening for Colon Cancer- Colonoscopy starting at age 71.  Screening may begin sooner depending on your family history and other health conditions. Follow up colonoscopy as directed by your Gastroenterologist.  Screening for Prostate Cancer- Both blood work (PSA) and a rectal exam help screen for Prostate Cancer.  Screening begins at age 64 with African-American men and at age 12 with Caucasian men.  Screening may begin sooner depending on your family history.  Take these medicines  Aspirin- One aspirin daily can help prevent Heart disease and Stroke.  Flu shot- Every fall.  Tetanus- Every 10 years.  Zostavax- Once after the age of 31 to prevent Shingles.  Pneumonia shot- Once after the age of 63; if you are younger than 61, ask your healthcare provider if you need a Pneumonia shot.  Take these steps  Don't smoke- If you do smoke, talk to your doctor about quitting.  For tips on how to quit, go to www.smokefree.gov or call 1-800-QUIT-NOW.  Be physically active- Exercise 5 days a week for at least 30 minutes.  If  you are not already  physically active start slow and gradually work up to 30 minutes of moderate physical activity.  Examples of moderate activity include walking briskly, mowing the yard, dancing, swimming, bicycling, etc.  Eat a healthy diet- Eat a variety of healthy food such as fruits, vegetables, low fat milk, low fat cheese, yogurt, lean meant, poultry, fish, beans, tofu, etc. For more information go to www.thenutritionsource.org  Drink alcohol in moderation- Limit alcohol intake to less than two drinks a day. Never drink and drive.  Dentist- Brush and floss twice daily; visit your dentist twice a year.  Depression- Your emotional health is as important as your physical health. If you're feeling down, or losing interest in things you would normally enjoy please talk to your healthcare provider.  Eye exam- Visit your eye doctor every year.  Safe sex- If you may be exposed to a sexually transmitted infection, use a condom.  Seat belts- Seat belts can save your life; always wear one.  Smoke/Carbon Monoxide detectors- These detectors need to be installed on the appropriate level of your home.  Replace batteries at least once a year.  Skin cancer- When out in the sun, cover up and use sunscreen 15 SPF or higher.  Violence- If anyone is threatening you, please tell your healthcare provider.  Living Will/ Health care power of attorney- Speak with your healthcare provider and family.    IF you received an x-[name] today, you will receive an invoice from [facility]. Please contact [facility] at [phone] with questions or concerns regarding your invoice.   IF you received labwork today, you will receive an invoice from [place]. Please contact [facility] at [phone] with questions or concerns regarding your invoice.   Our billing staff will not be able to assist you with questions regarding bills from these companies.  You will be contacted with the lab results as soon as they are  available. The fastest way to get your results is to activate your My Chart account. Instructions are located on the last page of this paperwork. If you have not heard from [place] regarding the results in 2 weeks, please contact this office.      I personally performed the services described in this documentation, which was scribed in my presence. The recorded information has been reviewed and considered for accuracy and completeness, addended by me as needed, and agree with information above.  Signed,   [name], MD [facility].  12/15/16 3:09 PM

## 2016-12-14 NOTE — Patient Instructions (Addendum)
Use lowest effective dose of Ambien or not at all if possible to sleep.   Current symptoms likely due to a viral infection. See information below on upper respiratory infections. If you're short of breath, fevers, or cold symptoms or not continuing to improve in the next 5-7 days, return for recheck.  Continue routine follow-up with your back specialist, no changes from my standpoint at this time.  I will check your kidney, liver cholesterol test.  Shoulder pain likely due to arthritis, but you have some decreased range of motion. If any worsening of range of motion, or increased stiffness, that can sometimes be from a frozen shoulder and would recommend evaluation with orthopedics for possible injection, physical therapy, or other treatments. Okay to take Tylenol if needed.  Keeping you healthy  Get these tests  Blood pressure- Have your blood pressure checked once a year by your healthcare provider.  Normal blood pressure is 120/80  Weight- Have your body mass index (BMI) calculated to screen for obesity.  BMI is a measure of body fat based on height and weight. You can also calculate your own BMI at ViewBanking.si.  Cholesterol- Have your cholesterol checked every year.  Diabetes- Have your blood sugar checked regularly if you have high blood pressure, high cholesterol, have a family history of diabetes or if you are overweight.  Screening for Colon Cancer- Colonoscopy starting at age 48.  Screening may begin sooner depending on your family history and other health conditions. Follow up colonoscopy as directed by your Gastroenterologist.  Screening for Prostate Cancer- Both blood work (PSA) and a rectal exam help screen for Prostate Cancer.  Screening begins at age 48 with African-American men and at age 72 with Caucasian men.  Screening may begin sooner depending on your family history.  Take these medicines  Aspirin- One aspirin daily can help prevent Heart disease and  Stroke.  Flu shot- Every fall.  Tetanus- Every 10 years.  Zostavax- Once after the age of 31 to prevent Shingles.  Pneumonia shot- Once after the age of 75; if you are younger than 49, ask your healthcare provider if you need a Pneumonia shot.  Take these steps  Don't smoke- If you do smoke, talk to your doctor about quitting.  For tips on how to quit, go to www.smokefree.gov or call 1-800-QUIT-NOW.  Be physically active- Exercise 5 days a week for at least 30 minutes.  If you are not already physically active start slow and gradually work up to 30 minutes of moderate physical activity.  Examples of moderate activity include walking briskly, mowing the yard, dancing, swimming, bicycling, etc.  Eat a healthy diet- Eat a variety of healthy food such as fruits, vegetables, low fat milk, low fat cheese, yogurt, lean meant, poultry, fish, beans, tofu, etc. For more information go to www.thenutritionsource.org  Drink alcohol in moderation- Limit alcohol intake to less than two drinks a day. Never drink and drive.  Dentist- Brush and floss twice daily; visit your dentist twice a year.  Depression- Your emotional health is as important as your physical health. If you're feeling down, or losing interest in things you would normally enjoy please talk to your healthcare provider.  Eye exam- Visit your eye doctor every year.  Safe sex- If you may be exposed to a sexually transmitted infection, use a condom.  Seat belts- Seat belts can save your life; always wear one.  Smoke/Carbon Monoxide detectors- These detectors need to be installed on the appropriate level of your  home.  Replace batteries at least once a year.  Skin cancer- When out in the sun, cover up and use sunscreen 15 SPF or higher.  Violence- If anyone is threatening you, please tell your healthcare provider.  Living Will/ Health care power of attorney- Speak with your healthcare provider and family.    IF you received an  x-ray today, you will receive an invoice from [facility]. Please contact [facility] at [phone] with questions or concerns regarding your invoice.   IF you received labwork today, you will receive an invoice from [place]. Please contact [facility] at [phone] with questions or concerns regarding your invoice.   Our billing staff will not be able to assist you with questions regarding bills from these companies.  You will be contacted with the lab results as soon as they are available. The fastest way to get your results is to activate your My Chart account. Instructions are located on the last page of this paperwork. If you have not heard from [place] regarding the results in 2 weeks, please contact this office.

## 2016-12-15 LAB — COMPREHENSIVE METABOLIC PANEL
ALBUMIN: 4.1 g/dL (ref 3.5–4.8)
ALK PHOS: 81 IU/L (ref 39–117)
ALT: 20 IU/L (ref 0–44)
AST: 24 IU/L (ref 0–40)
Albumin/Globulin Ratio: 1.7 (ref 1.2–2.2)
BILIRUBIN TOTAL: 0.3 mg/dL (ref 0.0–1.2)
BUN/Creatinine Ratio: 12 (ref 10–24)
BUN: 16 mg/dL (ref 8–27)
CALCIUM: 9 mg/dL (ref 8.6–10.2)
CO2: 25 mmol/L (ref 18–29)
Chloride: 106 mmol/L (ref 96–106)
Creatinine, Ser: 1.37 mg/dL — ABNORMAL HIGH (ref 0.76–1.27)
GFR calc Af Amer: 59 mL/min/{1.73_m2} — ABNORMAL LOW (ref >59)
GFR calc non Af Amer: 51 mL/min/{1.73_m2} — ABNORMAL LOW (ref >59)
Globulin, Total: 2.4 g/dL (ref 1.5–4.5)
Glucose: 83 mg/dL (ref 65–99)
POTASSIUM: 4.4 mmol/L (ref 3.5–5.2)
SODIUM: 145 mmol/L — AB (ref 134–144)
Total Protein: 6.5 g/dL (ref 6.0–8.5)

## 2016-12-15 LAB — LIPID PANEL
CHOLESTEROL TOTAL: 158 mg/dL (ref 100–199)
Chol/HDL Ratio: 4.5 ratio (ref 0.0–5.0)
HDL: 35 mg/dL — ABNORMAL LOW (ref >39)
LDL Calculated: 107 mg/dL — ABNORMAL HIGH (ref 0–99)
Triglycerides: 80 mg/dL (ref 0–149)
VLDL Cholesterol Cal: 16 mg/dL (ref 5–40)

## 2016-12-17 ENCOUNTER — Other Ambulatory Visit: Payer: Self-pay | Admitting: Family Medicine

## 2016-12-17 DIAGNOSIS — R7989 Other specified abnormal findings of blood chemistry: Secondary | ICD-10-CM

## 2016-12-17 NOTE — Progress Notes (Signed)
See lab notes. Elevated creat - lab only visit recommended.

## 2017-01-23 DIAGNOSIS — H52223 Regular astigmatism, bilateral: Secondary | ICD-10-CM | POA: Diagnosis not present

## 2017-01-23 DIAGNOSIS — H04123 Dry eye syndrome of bilateral lacrimal glands: Secondary | ICD-10-CM | POA: Diagnosis not present

## 2017-01-23 DIAGNOSIS — H5203 Hypermetropia, bilateral: Secondary | ICD-10-CM | POA: Diagnosis not present

## 2017-01-30 DIAGNOSIS — M545 Low back pain: Secondary | ICD-10-CM | POA: Diagnosis not present

## 2017-02-15 IMAGING — MR MR LUMBAR SPINE WO/W CM
4 of 7 series · 18 of 48 positions shown · IV contrast (multihance)
Comparison: 05/29/2016

CLINICAL DATA: Low back pain, bilateral leg pain, back surgery
03/30/2016

EXAM:
MRI LUMBAR SPINE WITHOUT AND WITH CONTRAST
TECHNIQUE: Multiplanar and multiecho pulse sequences of the lumbar spine were
obtained without and with intravenous contrast.
CONTRAST:  20mL MULTIHANCE GADOBENATE DIMEGLUMINE 529 MG/ML IV SOLN

[Series 3: T1 · sagittal · 4.0mm · 0.51mm/px · 3 of 16 slices shown (1 of 2)]
[im 1/16]
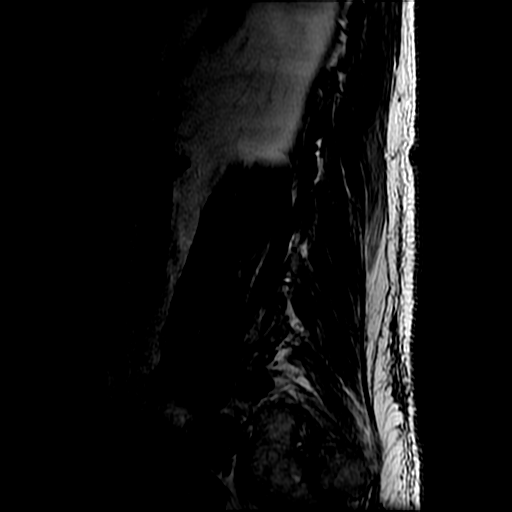
[im 8/16]
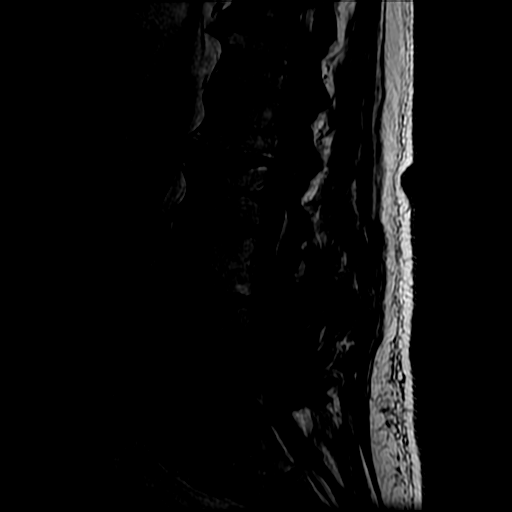
[im 16/16]
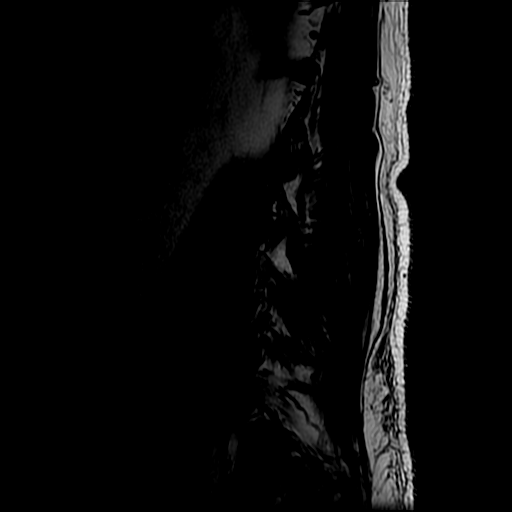

[Series 5: T2 · axial · 4.0mm · 0.41mm/px · z∈[-154,+71]mm · 8 of 46 slices shown]
[im 1/46]
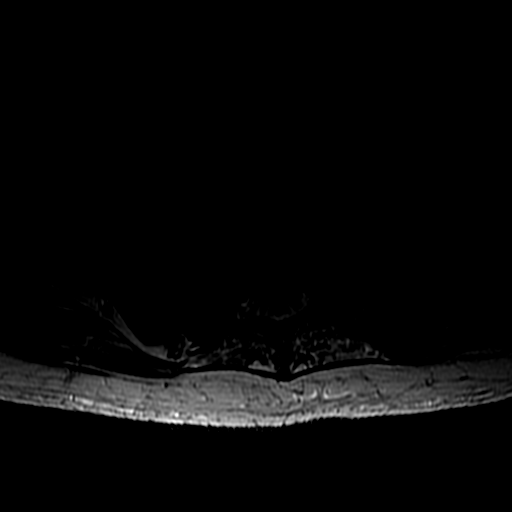
[im 6/46]
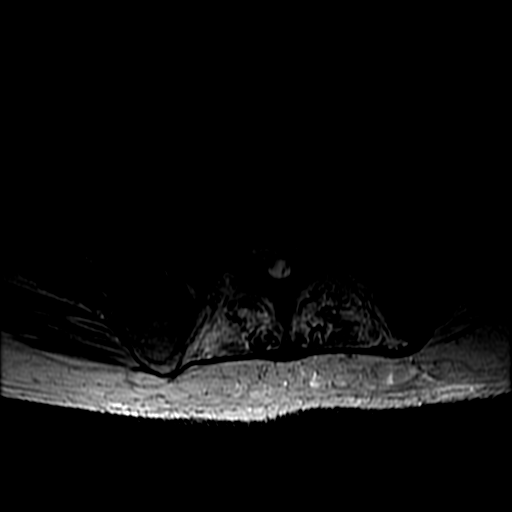
[im 16/46]
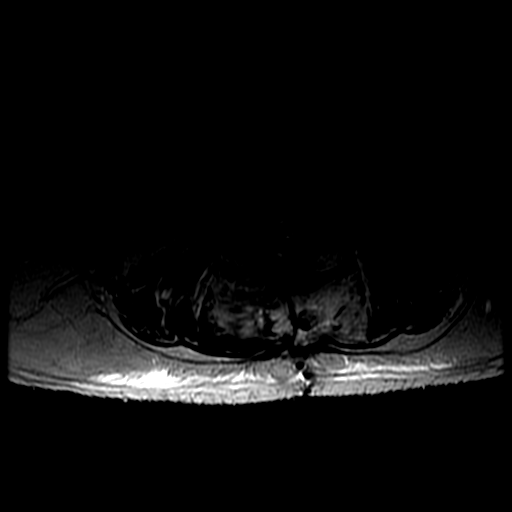
[im 21/46]
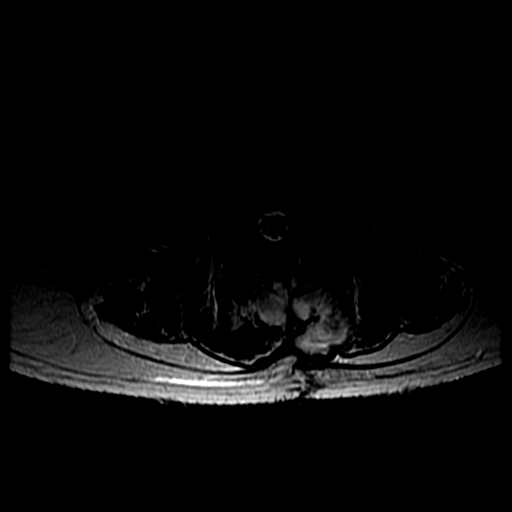
[im 26/46]
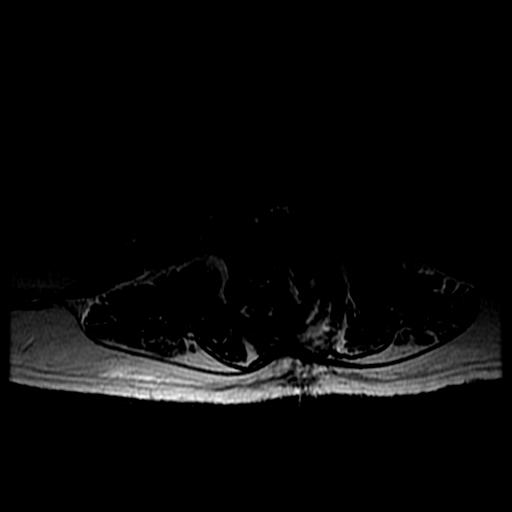
[im 31/46]
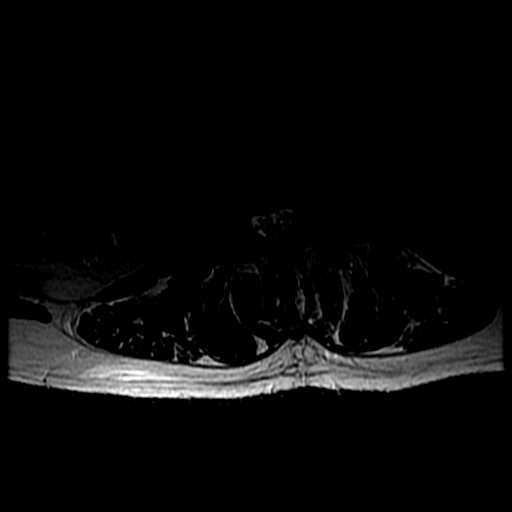
[im 41/46]
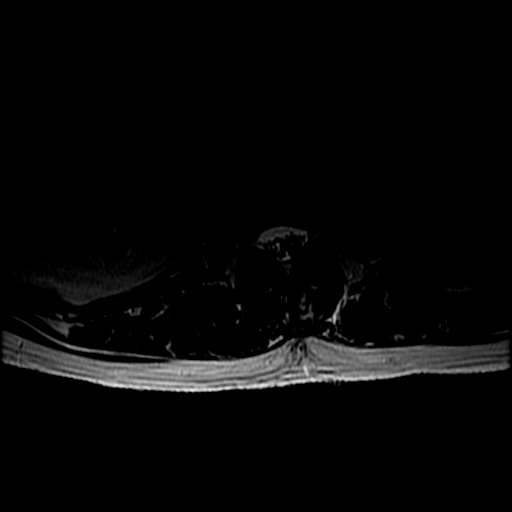
[im 46/46]
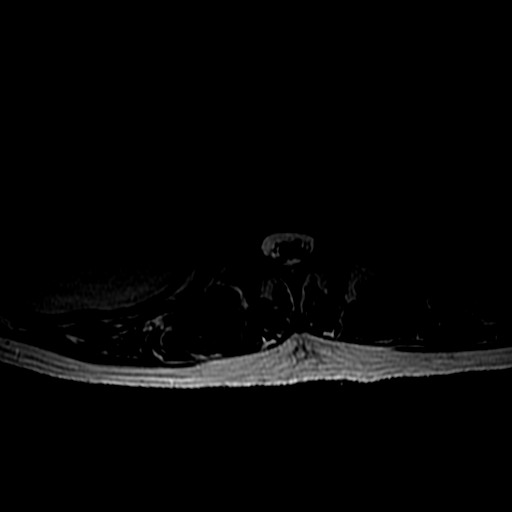

[Series 6: T1 · axial · 4.0mm · 0.41mm/px · z∈[-129,+71]mm · 3 of 50 slices shown (2 of 2)]
[im 5/50]
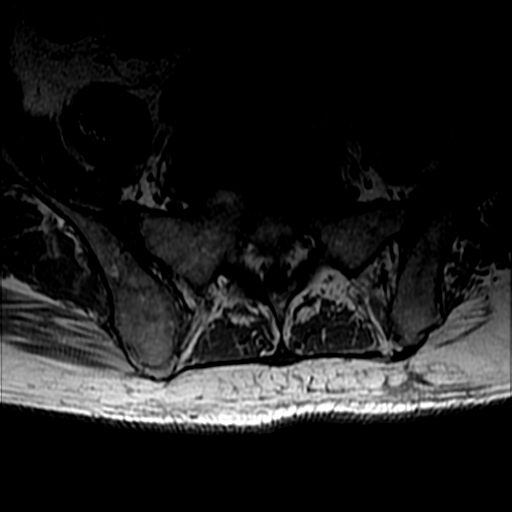
[im 25/50]
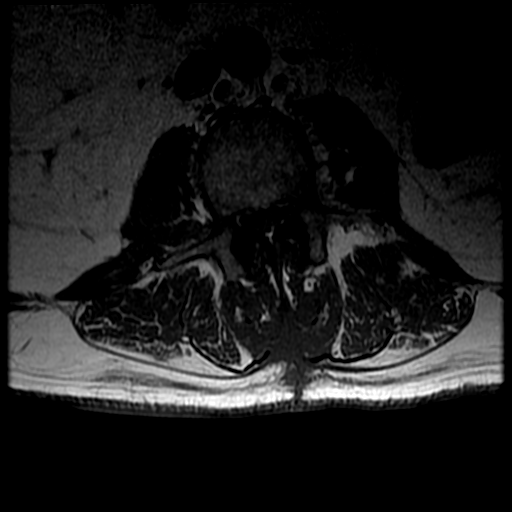
[im 45/50]
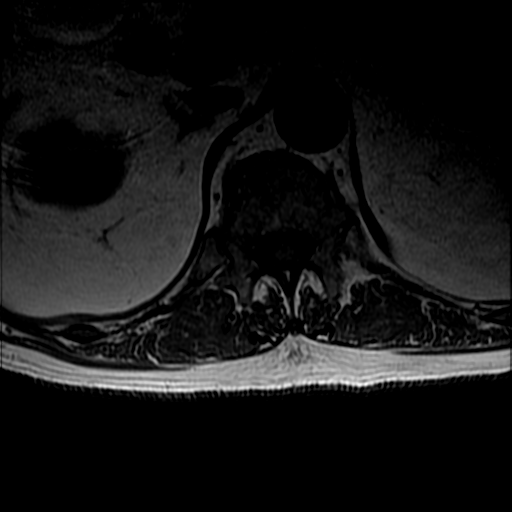

[Series 7: T2 post-contrast · sagittal · 4.0mm · 0.51mm/px · 4 of 16 slices shown]
[im 1/16]
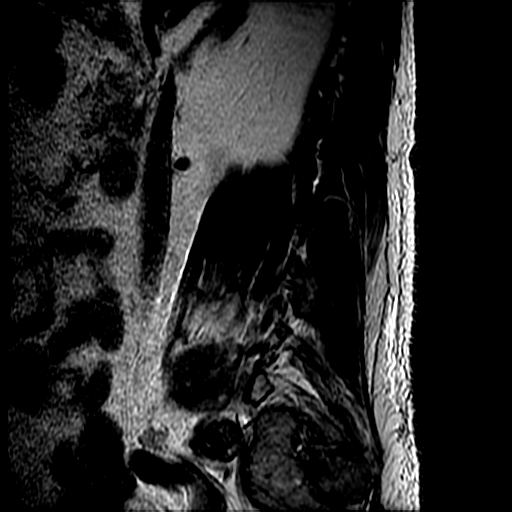
[im 6/16]
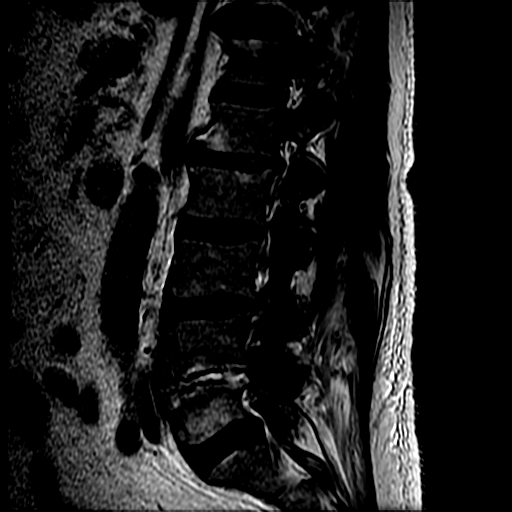
[im 11/16]
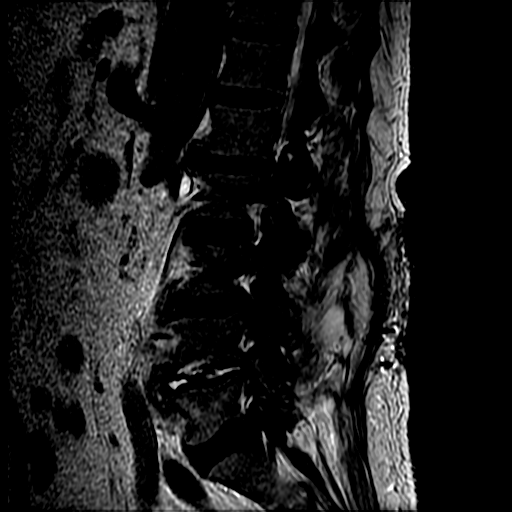
[im 16/16]
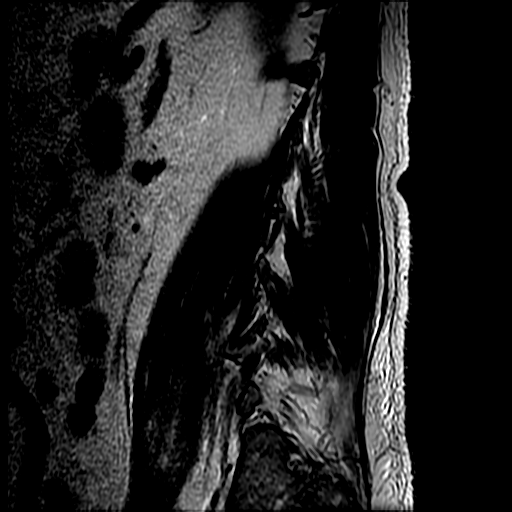

[18 of 48 positions shown; findings below may reference images not displayed]

FINDINGS: Segmentation:  Standard.

Alignment:  Physiologic.

Vertebrae: Fluid signal in the L4-5 disc which was previously
demonstrated to be desiccated. Severe marrow edema in the L4 and L5
vertebral bodies with endplate irregularity and avid enhancement on
post-contrast imaging which has worsened compared with the prior
exam of 05/29/2016. Severe paraspinal soft tissue enhancement and
enhancement of the medial aspects of the psoas muscles bilaterally
consistent with a phlegmon. No drainable paraspinal fluid
collection. Severe epidural enhancement at L4 and L5 anteriorly
which has increased compared with the prior examination most
consistent with an epidural phlegmon in without a drainable abscess
measuring approximately 10 mm in thickness. There is severe
posterior paraspinal soft tissue enhancement at L4-5 with an
enlarging 13 x 15 mm fluid collection along the right posterolateral
aspect of the thecal sac most consistent with an abscess at the site
of prior laminectomy.

Vertebral body heights are maintained.

Conus medullaris: Extends to the L1 level and appears normal.

Paraspinal and other soft tissues: No other paraspinal abnormality.

Disc levels:

Disc spaces: Disc height loss at L4-5.

T12-L1: No disc bulge. Mild bilateral facet arthropathy. No evidence
of neural foraminal stenosis. No central canal stenosis.

L1-L2: Mild broad-based disc bulge. Moderate bilateral facet
arthropathy. Mild spinal stenosis. No evidence of neural foraminal
stenosis.

L2-L3: Mild broad-based disc bulge. Severe bilateral facet
arthropathy. Moderate spinal stenosis and lateral recess stenosis.
No evidence of neural foraminal stenosis.

L3-L4: No significant disc protrusion. Moderate bilateral facet
arthropathy. Prior L4 laminectomy defect. No evidence of neural
foraminal stenosis. No central canal stenosis.

L4-L5: Prior laminectomy with severe abnormal paraspinal soft tissue
abnormality as described above. Broad-based disc bulge and epidural
phlegmon in resulting resultant severe spinal stenosis. Bilateral
mild foraminal stenosis.

L5-S1: Mild broad-based disc bulge. Severe bilateral facet
arthropathy. No evidence of neural foraminal stenosis.
IMPRESSION: 1. Severe discitis-osteomyelitis at L4-5 with extensive
paravertebral phlegmon. Severe posterior paraspinal soft tissue
enhancement in the surgical bed of prior L4-5 laminectomy with a
enlarging 13 x 15 mm right posterolateral fluid collection
consistent with an abscess. Anterior epidural phlegmon along the
posterior aspect of the L4-5 vertebral bodies without an epidural
abscess resulting in severe spinal stenosis at L4 and L5.

## 2017-03-02 ENCOUNTER — Other Ambulatory Visit: Payer: Self-pay | Admitting: Family Medicine

## 2017-03-02 DIAGNOSIS — G4700 Insomnia, unspecified: Secondary | ICD-10-CM

## 2017-03-04 NOTE — Telephone Encounter (Signed)
ambien refilled 

## 2017-03-06 ENCOUNTER — Other Ambulatory Visit: Payer: Self-pay | Admitting: Family Medicine

## 2017-03-06 DIAGNOSIS — G4700 Insomnia, unspecified: Secondary | ICD-10-CM

## 2017-03-06 NOTE — Telephone Encounter (Signed)
Please advise 

## 2017-03-10 IMAGING — DX DG LUMBAR SPINE 1V
1 series · 1 of 1 positions shown · non-contrast
Comparison: 07/20/2016, 07/21/2016

CLINICAL DATA: L4-5 discitis/osteomyelitis

EXAM:
LUMBAR SPINE - 1 VIEW

[l-spine lat]
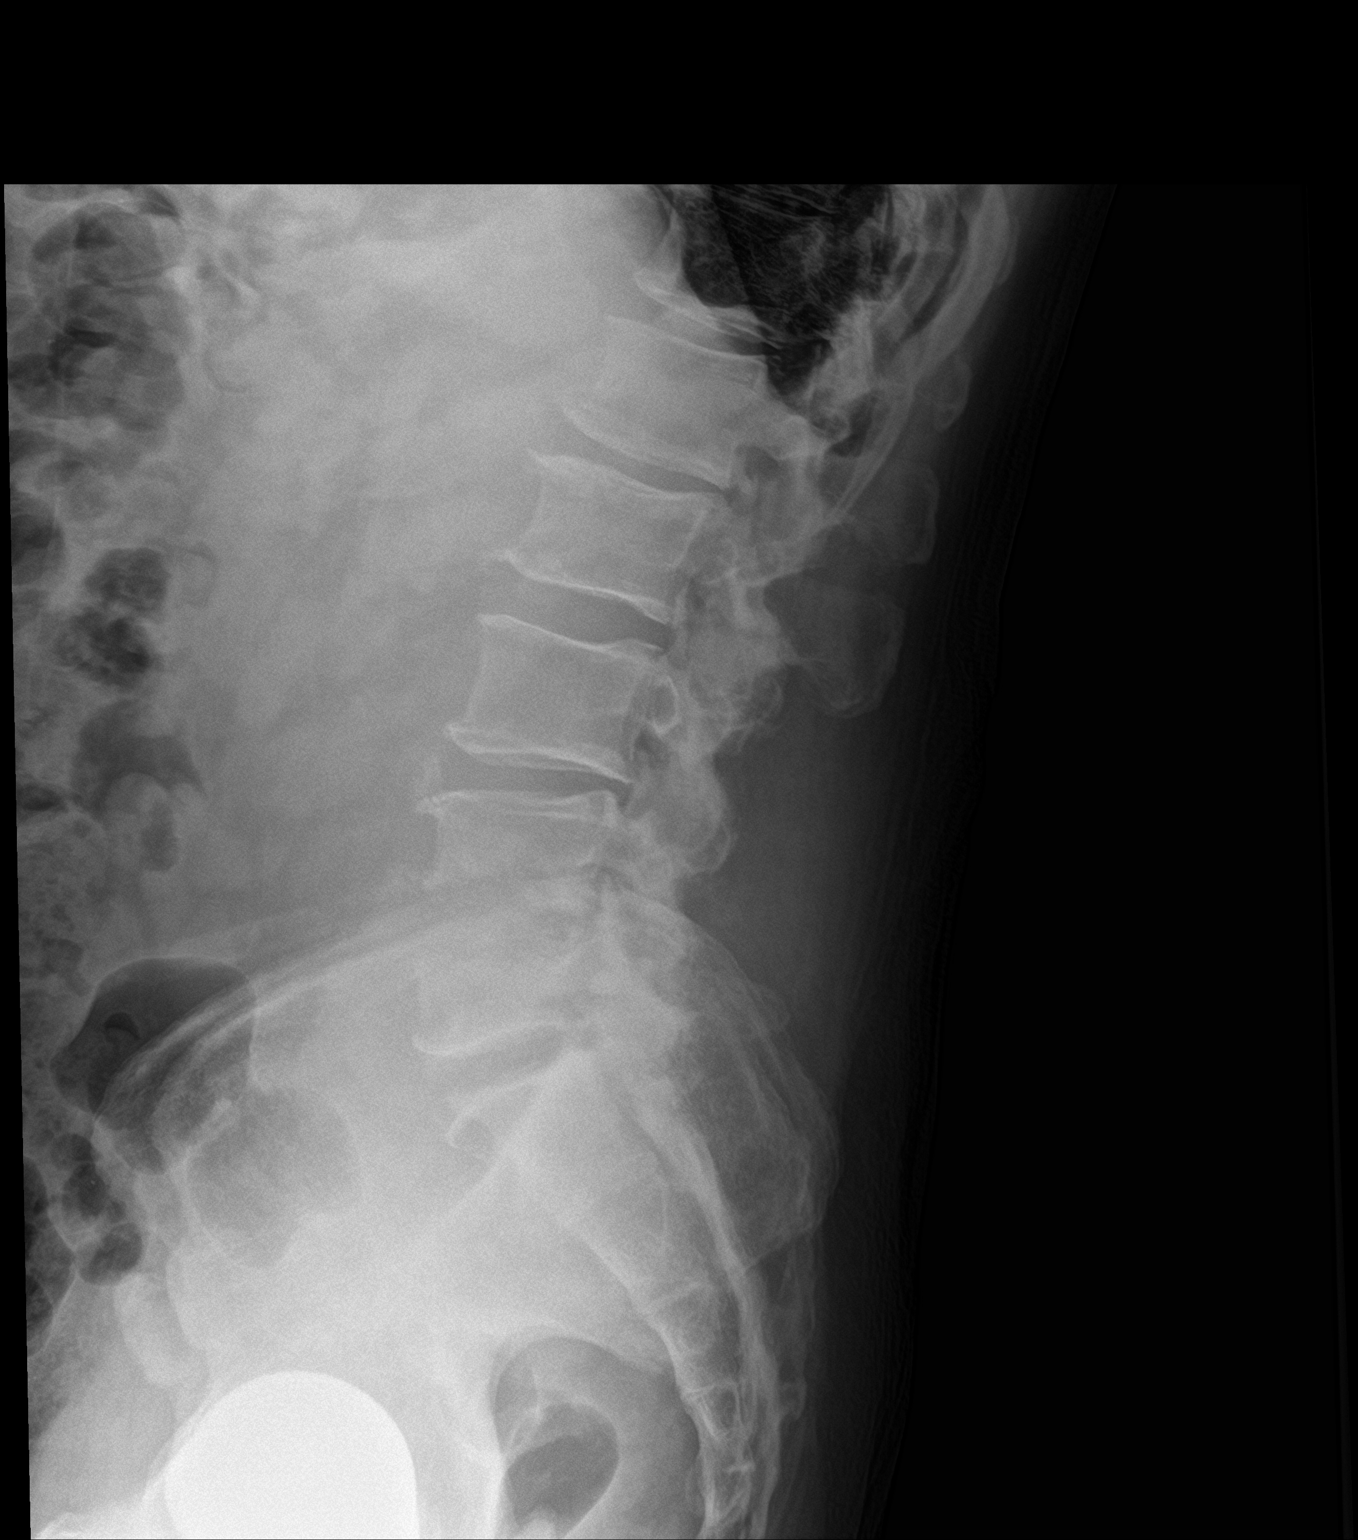

[1 of 1 positions shown; findings below may reference images not displayed]

FINDINGS: Normal alignment. Osseous destruction at the L4-5 endplates
compatible with known ongoing L4-5 discitis and osteomyelitis.
Postop changes from previous spinal surgery at L3-L5. No focal
kyphosis or severe compression fracture. No subluxation or
dislocation.
IMPRESSION: L4-5 discitis and osteomyelitis, better demonstrated by recent MR
and CT.

No gross malalignment.

## 2017-06-03 ENCOUNTER — Other Ambulatory Visit: Payer: Self-pay | Admitting: Family Medicine

## 2017-06-03 DIAGNOSIS — G4700 Insomnia, unspecified: Secondary | ICD-10-CM

## 2017-06-04 NOTE — Telephone Encounter (Signed)
Please advise 

## 2017-06-06 ENCOUNTER — Telehealth: Payer: Self-pay | Admitting: Family Medicine

## 2017-06-06 NOTE — Telephone Encounter (Signed)
Pt came in & explained that the pharmacy has faxed over 2 auth forms to be approved for his zolpidem tartrate & has not heard anything back yet & he is out of the medication. Its 10 MG & 45 tablets   Please advise [phone]

## 2017-06-07 NOTE — Telephone Encounter (Signed)
Pt called asking about the same issue about his zolpidem tartrate pt stated that he is out and needs a refill told pt that he was last seen in April and that Dr. Carlota Raspberry may want to see him but told pt I would put in a message..  Please advise

## 2017-06-08 ENCOUNTER — Telehealth: Payer: Self-pay | Admitting: *Deleted

## 2017-06-08 DIAGNOSIS — G4700 Insomnia, unspecified: Secondary | ICD-10-CM

## 2017-06-08 NOTE — Telephone Encounter (Signed)
Renay, can you pls look at med authorization request for this pt.

## 2017-06-08 NOTE — Telephone Encounter (Signed)
This is not a PA.  It is a refill request.  Sent to Dr. Vonna Kotyk assistant, Aniceto Boss.

## 2017-06-09 NOTE — Telephone Encounter (Signed)
Pt is calling to check on the status of this refill.  Please advise

## 2017-06-11 MED ORDER — ZOLPIDEM TARTRATE 10 MG PO TABS: ORAL_TABLET | ORAL | 0 refills | Status: AC

## 2017-06-11 NOTE — Telephone Encounter (Signed)
Discussed in April. Refilled.

## 2017-06-12 ENCOUNTER — Other Ambulatory Visit: Payer: Self-pay | Admitting: Family Medicine

## 2017-06-12 ENCOUNTER — Encounter: Payer: Self-pay | Admitting: Family Medicine

## 2017-06-12 DIAGNOSIS — G4700 Insomnia, unspecified: Secondary | ICD-10-CM

## 2017-06-13 NOTE — Telephone Encounter (Signed)
Refilled on October 1st.

## 2017-06-19 DIAGNOSIS — M1990 Unspecified osteoarthritis, unspecified site: Secondary | ICD-10-CM | POA: Diagnosis not present

## 2017-06-19 DIAGNOSIS — Z1389 Encounter for screening for other disorder: Secondary | ICD-10-CM | POA: Diagnosis not present

## 2017-06-19 DIAGNOSIS — M48061 Spinal stenosis, lumbar region without neurogenic claudication: Secondary | ICD-10-CM | POA: Diagnosis not present

## 2017-06-19 DIAGNOSIS — M4626 Osteomyelitis of vertebra, lumbar region: Secondary | ICD-10-CM | POA: Diagnosis not present

## 2017-06-19 DIAGNOSIS — Z8546 Personal history of malignant neoplasm of prostate: Secondary | ICD-10-CM | POA: Diagnosis not present

## 2017-06-19 DIAGNOSIS — D126 Benign neoplasm of colon, unspecified: Secondary | ICD-10-CM | POA: Diagnosis not present

## 2017-06-19 DIAGNOSIS — E7849 Other hyperlipidemia: Secondary | ICD-10-CM | POA: Diagnosis not present

## 2017-06-19 DIAGNOSIS — Z6830 Body mass index (BMI) 30.0-30.9, adult: Secondary | ICD-10-CM | POA: Diagnosis not present

## 2017-06-19 DIAGNOSIS — G4709 Other insomnia: Secondary | ICD-10-CM | POA: Diagnosis not present

## 2017-07-06 DIAGNOSIS — M48062 Spinal stenosis, lumbar region with neurogenic claudication: Secondary | ICD-10-CM | POA: Diagnosis not present

## 2017-07-13 ENCOUNTER — Other Ambulatory Visit: Payer: Self-pay | Admitting: Orthopedic Surgery

## 2017-07-13 DIAGNOSIS — M542 Cervicalgia: Secondary | ICD-10-CM

## 2017-07-23 ENCOUNTER — Other Ambulatory Visit: Payer: Self-pay | Admitting: Orthopedic Surgery

## 2017-07-23 ENCOUNTER — Ambulatory Visit
Admission: RE | Admit: 2017-07-23 | Discharge: 2017-07-23 | Disposition: A | Discharge status: Home / Self Care | Payer: PPO | Source: Ambulatory Visit | Attending: Orthopedic Surgery | Admitting: Orthopedic Surgery

## 2017-07-23 DIAGNOSIS — M4802 Spinal stenosis, cervical region: Secondary | ICD-10-CM | POA: Diagnosis not present

## 2017-07-23 DIAGNOSIS — M542 Cervicalgia: Secondary | ICD-10-CM

## 2017-07-23 DIAGNOSIS — M5410 Radiculopathy, site unspecified: Secondary | ICD-10-CM

## 2017-07-23 DIAGNOSIS — M48061 Spinal stenosis, lumbar region without neurogenic claudication: Secondary | ICD-10-CM | POA: Diagnosis not present

## 2017-07-24 DIAGNOSIS — M48062 Spinal stenosis, lumbar region with neurogenic claudication: Secondary | ICD-10-CM | POA: Diagnosis not present

## 2017-07-27 ENCOUNTER — Other Ambulatory Visit: Payer: Self-pay

## 2017-07-27 DIAGNOSIS — M48062 Spinal stenosis, lumbar region with neurogenic claudication: Secondary | ICD-10-CM | POA: Diagnosis not present

## 2017-07-27 NOTE — Patient Outreach (Addendum)
[facility]) Care Management  07/27/2017  Donald [name] [birth year] [id number]   Telephone call to patient for [employer] referral.  Patient able to verify HIPAA.  Explained to patient reason for call.  Discussed [facility]  Care Management services with patient.  Patient declined services at this time and states that he does not remember the nurse practitioner telling him about [place] [facility] referral and if she did he would have refused.  Patient feels like he has everything he needs and is under good care.   Plan:  RN CM will send letter and brochure as he states he did not get one from the nurse practitioner.   RN CM will close case and notify care management assistant of case status.    Jone Baseman, RN, MSN [facility] Care Management Care Management Coordinator Direct Line [phone] Toll Free: [phone]  Fax: [phone]

## 2017-08-14 DIAGNOSIS — M48062 Spinal stenosis, lumbar region with neurogenic claudication: Secondary | ICD-10-CM | POA: Diagnosis not present

## 2017-08-14 DIAGNOSIS — M4326 Fusion of spine, lumbar region: Secondary | ICD-10-CM | POA: Diagnosis not present

## 2017-08-14 DIAGNOSIS — M25511 Pain in right shoulder: Secondary | ICD-10-CM | POA: Diagnosis not present

## 2017-08-14 DIAGNOSIS — M25512 Pain in left shoulder: Secondary | ICD-10-CM | POA: Diagnosis not present

## 2017-08-28 DIAGNOSIS — M25511 Pain in right shoulder: Secondary | ICD-10-CM | POA: Diagnosis not present

## 2017-08-28 DIAGNOSIS — M48062 Spinal stenosis, lumbar region with neurogenic claudication: Secondary | ICD-10-CM | POA: Diagnosis not present

## 2017-08-28 DIAGNOSIS — M25512 Pain in left shoulder: Secondary | ICD-10-CM | POA: Diagnosis not present

## 2017-08-28 DIAGNOSIS — M4326 Fusion of spine, lumbar region: Secondary | ICD-10-CM | POA: Diagnosis not present

## 2017-11-07 DIAGNOSIS — H5203 Hypermetropia, bilateral: Secondary | ICD-10-CM | POA: Diagnosis not present

## 2017-11-07 DIAGNOSIS — H52221 Regular astigmatism, right eye: Secondary | ICD-10-CM | POA: Diagnosis not present

## 2017-11-07 DIAGNOSIS — H35373 Puckering of macula, bilateral: Secondary | ICD-10-CM | POA: Diagnosis not present

## 2017-11-07 DIAGNOSIS — H2513 Age-related nuclear cataract, bilateral: Secondary | ICD-10-CM | POA: Diagnosis not present

## 2017-11-07 DIAGNOSIS — H524 Presbyopia: Secondary | ICD-10-CM | POA: Diagnosis not present

## 2017-11-26 ENCOUNTER — Encounter: Payer: Self-pay | Admitting: Internal Medicine

## 2017-11-27 ENCOUNTER — Encounter: Payer: Self-pay | Admitting: Internal Medicine

## 2017-12-14 DIAGNOSIS — E7849 Other hyperlipidemia: Secondary | ICD-10-CM | POA: Diagnosis not present

## 2017-12-14 DIAGNOSIS — Z125 Encounter for screening for malignant neoplasm of prostate: Secondary | ICD-10-CM | POA: Diagnosis not present

## 2017-12-14 DIAGNOSIS — R82998 Other abnormal findings in urine: Secondary | ICD-10-CM | POA: Diagnosis not present

## 2017-12-14 DIAGNOSIS — E785 Hyperlipidemia, unspecified: Secondary | ICD-10-CM | POA: Diagnosis not present

## 2017-12-20 DIAGNOSIS — G4709 Other insomnia: Secondary | ICD-10-CM | POA: Diagnosis not present

## 2017-12-20 DIAGNOSIS — Z6829 Body mass index (BMI) 29.0-29.9, adult: Secondary | ICD-10-CM | POA: Diagnosis not present

## 2017-12-20 DIAGNOSIS — Z8546 Personal history of malignant neoplasm of prostate: Secondary | ICD-10-CM | POA: Diagnosis not present

## 2017-12-20 DIAGNOSIS — M4626 Osteomyelitis of vertebra, lumbar region: Secondary | ICD-10-CM | POA: Diagnosis not present

## 2017-12-20 DIAGNOSIS — D126 Benign neoplasm of colon, unspecified: Secondary | ICD-10-CM | POA: Diagnosis not present

## 2017-12-20 DIAGNOSIS — Z0000 Encounter for general adult medical examination without abnormal findings: Secondary | ICD-10-CM | POA: Diagnosis not present

## 2017-12-20 DIAGNOSIS — E7849 Other hyperlipidemia: Secondary | ICD-10-CM | POA: Diagnosis not present

## 2017-12-20 DIAGNOSIS — M48061 Spinal stenosis, lumbar region without neurogenic claudication: Secondary | ICD-10-CM | POA: Diagnosis not present

## 2017-12-20 DIAGNOSIS — Z1389 Encounter for screening for other disorder: Secondary | ICD-10-CM | POA: Diagnosis not present

## 2018-01-01 DIAGNOSIS — Z1212 Encounter for screening for malignant neoplasm of rectum: Secondary | ICD-10-CM | POA: Diagnosis not present

## 2018-01-28 ENCOUNTER — Ambulatory Visit (AMBULATORY_SURGERY_CENTER): Payer: Self-pay | Admitting: *Deleted

## 2018-01-28 ENCOUNTER — Other Ambulatory Visit: Payer: Self-pay

## 2018-01-28 ENCOUNTER — Encounter: Payer: Self-pay | Admitting: Internal Medicine

## 2018-01-28 VITALS — Ht 71.0 in | Wt 220.8 lb

## 2018-01-28 DIAGNOSIS — Z86010 Personal history of colonic polyps: Secondary | ICD-10-CM

## 2018-01-28 NOTE — Progress Notes (Signed)
No egg or soy allergy known to patient  No issues with past sedation with any surgeries  or procedures, no intubation problems  No diet pills per patient No home 02 use per patient  No blood thinners per patient  Pt denies issues with constipation  No A fib or A flutter  EMMI video sent to pt's e mail pt declined   

## 2018-02-11 ENCOUNTER — Other Ambulatory Visit: Payer: Self-pay

## 2018-02-11 ENCOUNTER — Encounter: Payer: Self-pay | Admitting: Internal Medicine

## 2018-02-11 ENCOUNTER — Ambulatory Visit (AMBULATORY_SURGERY_CENTER): Payer: PPO | Admitting: Internal Medicine

## 2018-02-11 VITALS — BP 110/69 | HR 49 | Temp 98.7°F | Resp 15 | Ht 71.0 in | Wt 220.0 lb

## 2018-02-11 DIAGNOSIS — Z86010 Personal history of colonic polyps: Secondary | ICD-10-CM | POA: Diagnosis not present

## 2018-02-11 DIAGNOSIS — D123 Benign neoplasm of transverse colon: Secondary | ICD-10-CM | POA: Diagnosis not present

## 2018-02-11 DIAGNOSIS — D125 Benign neoplasm of sigmoid colon: Secondary | ICD-10-CM | POA: Diagnosis not present

## 2018-02-11 DIAGNOSIS — D128 Benign neoplasm of rectum: Secondary | ICD-10-CM | POA: Diagnosis not present

## 2018-02-11 DIAGNOSIS — D120 Benign neoplasm of cecum: Secondary | ICD-10-CM | POA: Diagnosis not present

## 2018-02-11 DIAGNOSIS — D122 Benign neoplasm of ascending colon: Secondary | ICD-10-CM | POA: Diagnosis not present

## 2018-02-11 DIAGNOSIS — M545 Low back pain: Secondary | ICD-10-CM | POA: Diagnosis not present

## 2018-02-11 MED ORDER — SODIUM CHLORIDE 0.9 % IV SOLN
500.0000 mL | Freq: Once | INTRAVENOUS | Status: DC
Start: 2018-02-11 — End: 2018-02-11

## 2018-02-11 NOTE — Patient Instructions (Addendum)
   I found and removed 4 small polyps today. I will let you know pathology results and when to have another routine colonoscopy by mail and/or My Chart.   I appreciate the opportunity to care for you. Gatha Mayer, MD, [facility]   Await pathology results.  YOU HAD AN ENDOSCOPIC PROCEDURE TODAY AT [facility]:   Refer to the procedure report that was given to you for any specific questions about what was found during the examination.  If the procedure report does not answer your questions, please call your gastroenterologist to clarify.  If you requested that your care partner not be given the details of your procedure findings, then the procedure report has been included in a sealed envelope for you to review at your convenience later.  YOU SHOULD EXPECT: Some feelings of bloating in the abdomen. Passage of more gas than usual.  Walking can help get rid of the air that was put into your GI tract during the procedure and reduce the bloating. If you had a lower endoscopy (such as a colonoscopy or flexible sigmoidoscopy) you may notice spotting of blood in your stool or on the toilet paper. If you underwent a bowel prep for your procedure, you may not have a normal bowel movement for a few days.  Please Note:  You might notice some irritation and congestion in your nose or some drainage.  This is from the oxygen used during your procedure.  There is no need for concern and it should clear up in a day or so.  SYMPTOMS TO REPORT IMMEDIATELY:   Following lower endoscopy (colonoscopy or flexible sigmoidoscopy):  Excessive amounts of blood in the stool  Significant tenderness or worsening of abdominal pains  Swelling of the abdomen that is new, acute  Fever of 100F or higher    For urgent or emergent issues, a gastroenterologist can be reached at any hour by calling [phone].   DIET:  We do recommend a small meal at first, but then you may proceed to your regular  diet.  Drink plenty of fluids but you should avoid alcoholic beverages for 24 hours.  ACTIVITY:  You should plan to take it easy for the rest of today and you should NOT DRIVE or use heavy machinery until tomorrow (because of the sedation medicines used during the test).    FOLLOW UP: Our staff will call the number listed on your records the next business day following your procedure to check on you and address any questions or concerns that you may have regarding the information given to you following your procedure. If we do not reach you, we will leave a message.  However, if you are feeling well and you are not experiencing any problems, there is no need to return our call.  We will assume that you have returned to your regular daily activities without incident.  If any biopsies were taken you will be contacted by phone or by letter within the next 1-3 weeks.  Please call us at [phone] if you have not heard about the biopsies in 3 weeks.    SIGNATURES/CONFIDENTIALITY: You and/or your care partner have signed paperwork which will be entered into your electronic medical record.  These signatures attest to the fact that that the information above on your After Visit Summary has been reviewed and is understood.  Full responsibility of the confidentiality of this discharge information lies with you and/or your care-partner.

## 2018-02-11 NOTE — Op Note (Signed)
[name] Procedure Date: 02/11/2018 9:21 AM MRN: [id number] Endoscopist: Gatha Mayer , MD Age: 77 Referring MD:  Date of Birth: 23-Apr-1941 Gender: Male Account #: [id number] Procedure:                Colonoscopy Indications:              Surveillance: Personal history of adenomatous                            polyps on last colonoscopy > 3 years ago Medicines:                Propofol per Anesthesia, Monitored Anesthesia Care Procedure:                Pre-Anesthesia Assessment:                           - Prior to the procedure, a History and Physical                            was performed, and patient medications and                            allergies were reviewed. The patient's tolerance of                            previous anesthesia was also reviewed. The risks                            and benefits of the procedure and the sedation                            options and risks were discussed with the patient.                            All questions were answered, and informed consent                            was obtained. Prior Anticoagulants: The patient has                            taken no previous anticoagulant or antiplatelet                            agents. ASA Grade Assessment: III - A patient with                            severe systemic disease. After reviewing the risks                            and benefits, the patient was deemed in                            satisfactory condition to undergo the procedure.  After obtaining informed consent, the colonoscope                            was passed under direct vision. Throughout the                            procedure, the patient's blood pressure, pulse, and                            oxygen saturations were monitored continuously. The                            Colonoscope was introduced through the anus and    advanced to the the cecum, identified by                            appendiceal orifice and ileocecal valve. The                            colonoscopy was performed without difficulty. The                            patient tolerated the procedure well. The quality                            of the bowel preparation was good. The ileocecal                            valve, appendiceal orifice, and rectum were                            photographed. The bowel preparation used was                            Miralax. Scope In: 9:28:50 AM Scope Out: 9:44:23 AM Scope Withdrawal Time: 0 hours 12 minutes 42 seconds  Total Procedure Duration: 0 hours 15 minutes 33 seconds  Findings:                 The perianal and digital rectal examinations were                            normal. Pertinent negatives include normal prostate                            (size, shape, and consistency).                           Three sessile polyps were found in the rectum,                            sigmoid colon and transverse colon. The polyps were                            3 to 6 mm in size. These polyps  were removed with a                            cold snare. Resection and retrieval were complete.                            Verification of patient identification for the                            specimen was done. Estimated blood loss was minimal.                           A 1 to 2 mm polyp was found in the cecum. The polyp                            was sessile. The polyp was removed with a cold                            biopsy forceps. Resection and retrieval were                            complete. Verification of patient identification                            for the specimen was done. Estimated blood loss was                            minimal.                           The exam was otherwise without abnormality on                            direct and retroflexion views. Complications:             No immediate complications. Estimated Blood Loss:     Estimated blood loss was minimal. Impression:               - Three 3 to 6 mm polyps in the rectum, in the                            sigmoid colon and in the transverse colon, removed                            with a cold snare. Resected and retrieved.                           - One 1 to 2 mm polyp in the cecum, removed with a                            cold biopsy forceps. Resected and retrieved.                           - The examination was  otherwise normal on direct                            and retroflexion views.                           - Personal history of colonic polyps. Multiple                            adenomas - last in 2015 Recommendation:           - Patient has a contact number available for                            emergencies. The signs and symptoms of potential                            delayed complications were discussed with the                            patient. Return to normal activities tomorrow.                            Written discharge instructions were provided to the                            patient.                           - Resume previous diet.                           - Continue present medications.                           - Repeat colonoscopy is recommended for                            surveillance. The colonoscopy date will be                            determined after pathology results from today's                            exam become available for review. Gatha Mayer, MD 02/11/2018 9:52:53 AM This report has been signed electronically.

## 2018-02-11 NOTE — Progress Notes (Signed)
Report to PACU, RN, vss, BBS= Clear.  

## 2018-02-11 NOTE — Progress Notes (Signed)
No changes in medical or surgical hx since PV per pt 

## 2018-02-11 NOTE — Progress Notes (Signed)
Called to room to assist during endoscopic procedure.  Patient ID and intended procedure confirmed with present staff. Received instructions for my participation in the procedure from the performing physician.  

## 2018-02-12 ENCOUNTER — Telehealth: Payer: Self-pay | Admitting: *Deleted

## 2018-02-12 NOTE — Telephone Encounter (Signed)
  Follow up Call-  Call back number 02/11/2018  Post procedure Call Back phone  # [phone]  Permission to leave phone message Yes  Some recent data might be hidden     Patient questions:  Do you have a fever, pain , or abdominal swelling? No. Pain Score  0 *  Have you tolerated food without any problems? Yes.    Have you been able to return to your normal activities? Yes.    Do you have any questions about your discharge instructions: Diet   No. Medications  No. Follow up visit  No.  Do you have questions or concerns about your Care? No.  Actions: * If pain score is 4 or above: No action needed, pain <4.

## 2018-02-17 ENCOUNTER — Encounter: Payer: Self-pay | Admitting: Internal Medicine

## 2018-02-17 NOTE — Progress Notes (Signed)
4 adenomas Recall 2022 My Chart letter

## 2018-02-19 DIAGNOSIS — E7849 Other hyperlipidemia: Secondary | ICD-10-CM | POA: Diagnosis not present

## 2018-03-06 DIAGNOSIS — E7849 Other hyperlipidemia: Secondary | ICD-10-CM | POA: Diagnosis not present

## 2018-03-09 ENCOUNTER — Other Ambulatory Visit: Payer: Self-pay | Admitting: Internal Medicine

## 2018-03-09 DIAGNOSIS — N289 Disorder of kidney and ureter, unspecified: Secondary | ICD-10-CM | POA: Diagnosis not present

## 2018-04-05 ENCOUNTER — Other Ambulatory Visit: Payer: PPO

## 2018-04-12 ENCOUNTER — Ambulatory Visit
Admission: RE | Admit: 2018-04-12 | Discharge: 2018-04-12 | Disposition: A | Discharge status: Home / Self Care | Payer: PPO | Source: Ambulatory Visit | Attending: Internal Medicine | Admitting: Internal Medicine

## 2018-04-12 DIAGNOSIS — N289 Disorder of kidney and ureter, unspecified: Secondary | ICD-10-CM

## 2018-04-17 DIAGNOSIS — Z96641 Presence of right artificial hip joint: Secondary | ICD-10-CM | POA: Diagnosis not present

## 2018-04-17 DIAGNOSIS — Z96642 Presence of left artificial hip joint: Secondary | ICD-10-CM | POA: Diagnosis not present

## 2018-06-03 DIAGNOSIS — Z23 Encounter for immunization: Secondary | ICD-10-CM | POA: Diagnosis not present

## 2018-07-10 DIAGNOSIS — Z23 Encounter for immunization: Secondary | ICD-10-CM | POA: Diagnosis not present

## 2018-07-12 DIAGNOSIS — E7849 Other hyperlipidemia: Secondary | ICD-10-CM | POA: Diagnosis not present

## 2018-07-12 DIAGNOSIS — N289 Disorder of kidney and ureter, unspecified: Secondary | ICD-10-CM | POA: Diagnosis not present

## 2018-07-16 DIAGNOSIS — Z6830 Body mass index (BMI) 30.0-30.9, adult: Secondary | ICD-10-CM | POA: Diagnosis not present

## 2018-07-16 DIAGNOSIS — E7849 Other hyperlipidemia: Secondary | ICD-10-CM | POA: Diagnosis not present

## 2018-07-16 DIAGNOSIS — M1990 Unspecified osteoarthritis, unspecified site: Secondary | ICD-10-CM | POA: Diagnosis not present

## 2018-07-16 DIAGNOSIS — N289 Disorder of kidney and ureter, unspecified: Secondary | ICD-10-CM | POA: Diagnosis not present

## 2018-07-23 DIAGNOSIS — L820 Inflamed seborrheic keratosis: Secondary | ICD-10-CM | POA: Diagnosis not present

## 2018-07-23 DIAGNOSIS — D485 Neoplasm of uncertain behavior of skin: Secondary | ICD-10-CM | POA: Diagnosis not present

## 2018-07-23 DIAGNOSIS — Z85828 Personal history of other malignant neoplasm of skin: Secondary | ICD-10-CM | POA: Diagnosis not present

## 2018-07-23 DIAGNOSIS — L821 Other seborrheic keratosis: Secondary | ICD-10-CM | POA: Diagnosis not present

## 2018-07-26 DIAGNOSIS — M19012 Primary osteoarthritis, left shoulder: Secondary | ICD-10-CM | POA: Diagnosis not present

## 2018-07-26 DIAGNOSIS — M19011 Primary osteoarthritis, right shoulder: Secondary | ICD-10-CM | POA: Diagnosis not present

## 2018-11-26 DIAGNOSIS — H5203 Hypermetropia, bilateral: Secondary | ICD-10-CM | POA: Diagnosis not present

## 2018-12-31 DIAGNOSIS — R82998 Other abnormal findings in urine: Secondary | ICD-10-CM | POA: Diagnosis not present

## 2018-12-31 DIAGNOSIS — E7849 Other hyperlipidemia: Secondary | ICD-10-CM | POA: Diagnosis not present

## 2018-12-31 DIAGNOSIS — N183 Chronic kidney disease, stage 3 (moderate): Secondary | ICD-10-CM | POA: Diagnosis not present

## 2018-12-31 DIAGNOSIS — Z125 Encounter for screening for malignant neoplasm of prostate: Secondary | ICD-10-CM | POA: Diagnosis not present

## 2019-01-06 DIAGNOSIS — M48061 Spinal stenosis, lumbar region without neurogenic claudication: Secondary | ICD-10-CM | POA: Diagnosis not present

## 2019-01-06 DIAGNOSIS — N183 Chronic kidney disease, stage 3 (moderate): Secondary | ICD-10-CM | POA: Diagnosis not present

## 2019-01-06 DIAGNOSIS — M4626 Osteomyelitis of vertebra, lumbar region: Secondary | ICD-10-CM | POA: Diagnosis not present

## 2019-01-06 DIAGNOSIS — M1990 Unspecified osteoarthritis, unspecified site: Secondary | ICD-10-CM | POA: Diagnosis not present

## 2019-01-06 DIAGNOSIS — D126 Benign neoplasm of colon, unspecified: Secondary | ICD-10-CM | POA: Diagnosis not present

## 2019-01-06 DIAGNOSIS — G4700 Insomnia, unspecified: Secondary | ICD-10-CM | POA: Diagnosis not present

## 2019-01-06 DIAGNOSIS — E785 Hyperlipidemia, unspecified: Secondary | ICD-10-CM | POA: Diagnosis not present

## 2019-01-06 DIAGNOSIS — Z8546 Personal history of malignant neoplasm of prostate: Secondary | ICD-10-CM | POA: Diagnosis not present

## 2019-01-06 DIAGNOSIS — Z0000 Encounter for general adult medical examination without abnormal findings: Secondary | ICD-10-CM | POA: Diagnosis not present

## 2019-01-06 DIAGNOSIS — Z1331 Encounter for screening for depression: Secondary | ICD-10-CM | POA: Diagnosis not present

## 2019-03-28 DIAGNOSIS — R509 Fever, unspecified: Secondary | ICD-10-CM | POA: Diagnosis not present

## 2019-03-28 DIAGNOSIS — R42 Dizziness and giddiness: Secondary | ICD-10-CM | POA: Diagnosis not present

## 2019-03-28 DIAGNOSIS — Z20818 Contact with and (suspected) exposure to other bacterial communicable diseases: Secondary | ICD-10-CM | POA: Diagnosis not present

## 2019-06-11 DIAGNOSIS — D485 Neoplasm of uncertain behavior of skin: Secondary | ICD-10-CM | POA: Diagnosis not present

## 2019-06-11 DIAGNOSIS — D234 Other benign neoplasm of skin of scalp and neck: Secondary | ICD-10-CM | POA: Diagnosis not present

## 2019-06-11 DIAGNOSIS — D2339 Other benign neoplasm of skin of other parts of face: Secondary | ICD-10-CM | POA: Diagnosis not present

## 2019-06-11 DIAGNOSIS — Z85828 Personal history of other malignant neoplasm of skin: Secondary | ICD-10-CM | POA: Diagnosis not present

## 2019-06-11 DIAGNOSIS — L821 Other seborrheic keratosis: Secondary | ICD-10-CM | POA: Diagnosis not present

## 2019-06-12 DIAGNOSIS — Z23 Encounter for immunization: Secondary | ICD-10-CM | POA: Diagnosis not present

## 2019-09-20 ENCOUNTER — Ambulatory Visit: Payer: Medicare Other | Attending: Internal Medicine

## 2019-09-20 DIAGNOSIS — Z23 Encounter for immunization: Secondary | ICD-10-CM | POA: Insufficient documentation

## 2019-09-20 NOTE — Progress Notes (Signed)
   Covid-19 Vaccination Clinic  Name:  Donald [name]    MRN: [id number] DOB: 1941-06-06  09/20/2019  Mr. [name] was observed post Covid-19 immunization for 15 minutes without incidence. He was provided with Vaccine Information Sheet and instruction to access the V-Safe system.   Mr. [name] was instructed to call 911 with any severe reactions post vaccine: Marland Kitchen Difficulty breathing  . Swelling of your face and throat  . A fast heartbeat  . A bad rash all over your body  . Dizziness and weakness    Immunizations Administered    Name Date Dose VIS Date Route   Pfizer COVID-19 Vaccine 09/20/2019  1:34 PM 0.3 mL 08/22/2019 Intramuscular   Manufacturer: [employer]   Lot: [id number]   [place]: [id number]

## 2019-10-10 ENCOUNTER — Ambulatory Visit: Payer: PPO

## 2019-10-11 ENCOUNTER — Ambulatory Visit: Payer: PPO | Attending: Internal Medicine

## 2019-10-11 DIAGNOSIS — Z23 Encounter for immunization: Secondary | ICD-10-CM

## 2019-10-11 NOTE — Progress Notes (Signed)
   Covid-19 Vaccination Clinic  Name:  Donald [name]    MRN: [id number] DOB: Oct 08, 1940  10/11/2019  [name] was observed post Covid-19 immunization for 15 minutes without incidence. He was provided with Vaccine Information Sheet and instruction to access the V-Safe system.   [name] was instructed to call 911 with any severe reactions post vaccine: Marland Kitchen Difficulty breathing  . Swelling of your face and throat  . A fast heartbeat  . A bad rash all over your body  . Dizziness and weakness    Immunizations Administered    Name Date Dose VIS Date Route   Pfizer COVID-19 Vaccine 10/11/2019  8:39 AM 0.3 mL 08/22/2019 Intramuscular   Manufacturer: [place]   Lot: [id number]   [place]: [id number]

## 2019-10-20 ENCOUNTER — Other Ambulatory Visit: Payer: Self-pay | Admitting: Nurse Practitioner

## 2019-10-20 DIAGNOSIS — Z20818 Contact with and (suspected) exposure to other bacterial communicable diseases: Secondary | ICD-10-CM | POA: Diagnosis not present

## 2019-10-20 DIAGNOSIS — U071 COVID-19: Secondary | ICD-10-CM

## 2019-10-20 DIAGNOSIS — R509 Fever, unspecified: Secondary | ICD-10-CM | POA: Diagnosis not present

## 2019-10-20 NOTE — Progress Notes (Signed)
  I connected by phone with Donald [name] on 10/20/2019 at 3:19 PM to discuss the potential use of an new treatment for mild to moderate COVID-19 viral infection in non-hospitalized patients.  This patient is a 79 y.o. male that meets the FDA criteria for Emergency Use Authorization of bamlanivimab or casirivimab\imdevimab.  Has a (+) direct SARS-CoV-2 viral test result  Has mild or moderate COVID-19   Is ? 77 years of age and weighs ? 40 kg  Is NOT hospitalized due to COVID-19  Is NOT requiring oxygen therapy or requiring an increase in baseline oxygen flow rate due to COVID-19  Is within 10 days of symptom onset  Has at least one of the high risk factor(s) for progression to severe COVID-19 and/or hospitalization as defined in EUA.  Specific high risk criteria : >/= 77 yo   I have spoken and communicated the following to the patient or parent/caregiver:  1. FDA has authorized the emergency use of bamlanivimab and casirivimab\imdevimab for the treatment of mild to moderate COVID-19 in adults and pediatric patients with positive results of direct SARS-CoV-2 viral testing who are 27 years of age and older weighing at least 40 kg, and who are at high risk for progressing to severe COVID-19 and/or hospitalization.  2. The significant known and potential risks and benefits of bamlanivimab and casirivimab\imdevimab, and the extent to which such potential risks and benefits are unknown.  3. Information on available alternative treatments and the risks and benefits of those alternatives, including clinical trials.  4. Patients treated with bamlanivimab and casirivimab\imdevimab should continue to self-isolate and use infection control measures (e.g., wear mask, isolate, social distance, avoid sharing personal items, clean and disinfect "high touch" surfaces, and frequent handwashing) according to CDC guidelines.   5. The patient or parent/caregiver has the option to accept or refuse  bamlanivimab or casirivimab\imdevimab .  After reviewing this information with the patient, The patient agreed to proceed with receiving the bamlanimivab infusion and will be provided a copy of the Fact sheet prior to receiving the infusion.[name] 10/20/2019 3:19 PM

## 2019-10-21 ENCOUNTER — Ambulatory Visit (HOSPITAL_COMMUNITY)
Admission: RE | Admit: 2019-10-21 | Discharge: 2019-10-21 | Disposition: A | Discharge status: Home / Self Care | Payer: Medicare Other | Source: Ambulatory Visit | Attending: Pulmonary Disease | Admitting: Pulmonary Disease

## 2019-10-21 DIAGNOSIS — U071 COVID-19: Secondary | ICD-10-CM | POA: Insufficient documentation

## 2019-10-21 DIAGNOSIS — Z23 Encounter for immunization: Secondary | ICD-10-CM | POA: Diagnosis not present

## 2019-10-21 MED ORDER — ALBUTEROL SULFATE HFA 108 (90 BASE) MCG/ACT IN AERS: 2.0000 | INHALATION_SPRAY | Freq: Once | RESPIRATORY_TRACT | Status: DC | PRN

## 2019-10-21 MED ORDER — FAMOTIDINE IN NACL 20-0.9 MG/50ML-% IV SOLN: 20.0000 mg | Freq: Once | INTRAVENOUS | Status: DC | PRN

## 2019-10-21 MED ORDER — SODIUM CHLORIDE 0.9 % IV SOLN
INTRAVENOUS | Status: DC | PRN
  Administered 2019-10-21: 250 mL via INTRAVENOUS

## 2019-10-21 MED ORDER — METHYLPREDNISOLONE SODIUM SUCC 125 MG IJ SOLR: 125.0000 mg | Freq: Once | INTRAMUSCULAR | Status: DC | PRN

## 2019-10-21 MED ORDER — EPINEPHRINE 0.3 MG/0.3ML IJ SOAJ: 0.3000 mg | Freq: Once | INTRAMUSCULAR | Status: DC | PRN

## 2019-10-21 MED ORDER — DIPHENHYDRAMINE HCL 50 MG/ML IJ SOLN: 50.0000 mg | Freq: Once | INTRAMUSCULAR | Status: DC | PRN

## 2019-10-21 MED ORDER — SODIUM CHLORIDE 0.9 % IV SOLN
700.0000 mg | Freq: Once | INTRAVENOUS | Status: AC
  Administered 2019-10-21: 700 mg via INTRAVENOUS
  Filled 2019-10-21: qty 20

## 2019-10-21 NOTE — Discharge Instructions (Signed)

## 2019-10-21 NOTE — Progress Notes (Signed)
  Diagnosis: COVID-19  Physician: Dr. Joya Gaskins  Procedure: Covid Infusion Clinic Med: bamlanivimab infusion - Provided patient with bamlanimivab fact sheet for patients, parents and caregivers prior to infusion.  Complications: No immediate complications noted.  Discharge: Discharged home   Janine Ores 10/21/2019

## 2019-11-03 ENCOUNTER — Ambulatory Visit: Payer: Medicare Other | Attending: Internal Medicine

## 2019-11-03 DIAGNOSIS — Z20822 Contact with and (suspected) exposure to covid-19: Secondary | ICD-10-CM | POA: Diagnosis not present

## 2019-11-04 LAB — NOVEL CORONAVIRUS, NAA: SARS-CoV-2, NAA: NOT DETECTED

## 2020-01-05 DIAGNOSIS — E7849 Other hyperlipidemia: Secondary | ICD-10-CM | POA: Diagnosis not present

## 2020-01-05 DIAGNOSIS — Z125 Encounter for screening for malignant neoplasm of prostate: Secondary | ICD-10-CM | POA: Diagnosis not present

## 2020-01-12 DIAGNOSIS — R82998 Other abnormal findings in urine: Secondary | ICD-10-CM | POA: Diagnosis not present

## 2020-01-12 DIAGNOSIS — M4626 Osteomyelitis of vertebra, lumbar region: Secondary | ICD-10-CM | POA: Diagnosis not present

## 2020-01-12 DIAGNOSIS — M1990 Unspecified osteoarthritis, unspecified site: Secondary | ICD-10-CM | POA: Diagnosis not present

## 2020-01-12 DIAGNOSIS — D126 Benign neoplasm of colon, unspecified: Secondary | ICD-10-CM | POA: Diagnosis not present

## 2020-01-12 DIAGNOSIS — G4700 Insomnia, unspecified: Secondary | ICD-10-CM | POA: Diagnosis not present

## 2020-01-12 DIAGNOSIS — Z0000 Encounter for general adult medical examination without abnormal findings: Secondary | ICD-10-CM | POA: Diagnosis not present

## 2020-01-12 DIAGNOSIS — N1831 Chronic kidney disease, stage 3a: Secondary | ICD-10-CM | POA: Diagnosis not present

## 2020-01-12 DIAGNOSIS — E7849 Other hyperlipidemia: Secondary | ICD-10-CM | POA: Diagnosis not present

## 2020-01-12 DIAGNOSIS — M48061 Spinal stenosis, lumbar region without neurogenic claudication: Secondary | ICD-10-CM | POA: Diagnosis not present

## 2020-01-12 DIAGNOSIS — Z8616 Personal history of COVID-19: Secondary | ICD-10-CM | POA: Diagnosis not present

## 2020-01-12 DIAGNOSIS — R600 Localized edema: Secondary | ICD-10-CM | POA: Diagnosis not present

## 2020-01-12 DIAGNOSIS — Z1331 Encounter for screening for depression: Secondary | ICD-10-CM | POA: Diagnosis not present

## 2020-01-12 DIAGNOSIS — Z8546 Personal history of malignant neoplasm of prostate: Secondary | ICD-10-CM | POA: Diagnosis not present

## 2020-01-12 DIAGNOSIS — E785 Hyperlipidemia, unspecified: Secondary | ICD-10-CM | POA: Diagnosis not present

## 2020-01-12 DIAGNOSIS — Z638 Other specified problems related to primary support group: Secondary | ICD-10-CM | POA: Diagnosis not present

## 2020-01-13 ENCOUNTER — Other Ambulatory Visit (HOSPITAL_COMMUNITY): Payer: Self-pay | Admitting: Internal Medicine

## 2020-01-13 ENCOUNTER — Other Ambulatory Visit: Payer: Self-pay

## 2020-01-13 ENCOUNTER — Ambulatory Visit (HOSPITAL_COMMUNITY)
Admission: RE | Admit: 2020-01-13 | Discharge: 2020-01-13 | Disposition: A | Discharge status: Home / Self Care | Payer: PPO | Source: Ambulatory Visit | Attending: Vascular Surgery | Admitting: Vascular Surgery

## 2020-01-13 DIAGNOSIS — R609 Edema, unspecified: Secondary | ICD-10-CM | POA: Insufficient documentation

## 2020-01-16 DIAGNOSIS — Z1212 Encounter for screening for malignant neoplasm of rectum: Secondary | ICD-10-CM | POA: Diagnosis not present

## 2020-01-29 DIAGNOSIS — M48062 Spinal stenosis, lumbar region with neurogenic claudication: Secondary | ICD-10-CM | POA: Diagnosis not present

## 2020-01-29 DIAGNOSIS — M4326 Fusion of spine, lumbar region: Secondary | ICD-10-CM | POA: Diagnosis not present

## 2020-01-29 DIAGNOSIS — M545 Low back pain: Secondary | ICD-10-CM | POA: Diagnosis not present

## 2020-02-10 DIAGNOSIS — M48062 Spinal stenosis, lumbar region with neurogenic claudication: Secondary | ICD-10-CM | POA: Diagnosis not present

## 2020-02-24 DIAGNOSIS — M4326 Fusion of spine, lumbar region: Secondary | ICD-10-CM | POA: Diagnosis not present

## 2020-02-24 DIAGNOSIS — M48062 Spinal stenosis, lumbar region with neurogenic claudication: Secondary | ICD-10-CM | POA: Diagnosis not present

## 2020-03-29 DIAGNOSIS — M48062 Spinal stenosis, lumbar region with neurogenic claudication: Secondary | ICD-10-CM | POA: Diagnosis not present

## 2020-03-29 DIAGNOSIS — M4326 Fusion of spine, lumbar region: Secondary | ICD-10-CM | POA: Diagnosis not present

## 2020-04-20 DIAGNOSIS — M48061 Spinal stenosis, lumbar region without neurogenic claudication: Secondary | ICD-10-CM | POA: Diagnosis not present

## 2020-04-20 DIAGNOSIS — M47816 Spondylosis without myelopathy or radiculopathy, lumbar region: Secondary | ICD-10-CM | POA: Diagnosis not present

## 2020-04-20 DIAGNOSIS — M5136 Other intervertebral disc degeneration, lumbar region: Secondary | ICD-10-CM | POA: Diagnosis not present

## 2020-04-20 DIAGNOSIS — M47815 Spondylosis without myelopathy or radiculopathy, thoracolumbar region: Secondary | ICD-10-CM | POA: Diagnosis not present

## 2020-04-20 DIAGNOSIS — M5135 Other intervertebral disc degeneration, thoracolumbar region: Secondary | ICD-10-CM | POA: Diagnosis not present

## 2020-04-20 DIAGNOSIS — Z981 Arthrodesis status: Secondary | ICD-10-CM | POA: Diagnosis not present

## 2020-04-27 DIAGNOSIS — Z20828 Contact with and (suspected) exposure to other viral communicable diseases: Secondary | ICD-10-CM | POA: Diagnosis not present

## 2020-06-02 DIAGNOSIS — M4326 Fusion of spine, lumbar region: Secondary | ICD-10-CM | POA: Diagnosis not present

## 2020-06-02 DIAGNOSIS — M48062 Spinal stenosis, lumbar region with neurogenic claudication: Secondary | ICD-10-CM | POA: Diagnosis not present

## 2020-06-12 DIAGNOSIS — Z23 Encounter for immunization: Secondary | ICD-10-CM | POA: Diagnosis not present

## 2020-09-13 DIAGNOSIS — E785 Hyperlipidemia, unspecified: Secondary | ICD-10-CM | POA: Diagnosis not present

## 2020-09-13 DIAGNOSIS — Z029 Encounter for administrative examinations, unspecified: Secondary | ICD-10-CM | POA: Diagnosis not present

## 2020-09-13 DIAGNOSIS — N1831 Chronic kidney disease, stage 3a: Secondary | ICD-10-CM | POA: Diagnosis not present

## 2020-09-13 DIAGNOSIS — M48061 Spinal stenosis, lumbar region without neurogenic claudication: Secondary | ICD-10-CM | POA: Diagnosis not present

## 2020-09-13 DIAGNOSIS — G4700 Insomnia, unspecified: Secondary | ICD-10-CM | POA: Diagnosis not present

## 2020-10-29 DIAGNOSIS — M48061 Spinal stenosis, lumbar region without neurogenic claudication: Secondary | ICD-10-CM | POA: Diagnosis not present

## 2020-10-29 DIAGNOSIS — R202 Paresthesia of skin: Secondary | ICD-10-CM | POA: Diagnosis not present

## 2020-10-29 DIAGNOSIS — M5030 Other cervical disc degeneration, unspecified cervical region: Secondary | ICD-10-CM | POA: Diagnosis not present

## 2020-10-29 DIAGNOSIS — F418 Other specified anxiety disorders: Secondary | ICD-10-CM | POA: Diagnosis not present

## 2020-11-12 DIAGNOSIS — M48062 Spinal stenosis, lumbar region with neurogenic claudication: Secondary | ICD-10-CM | POA: Diagnosis not present

## 2020-11-12 DIAGNOSIS — Z981 Arthrodesis status: Secondary | ICD-10-CM | POA: Diagnosis not present

## 2020-11-22 DIAGNOSIS — R059 Cough, unspecified: Secondary | ICD-10-CM | POA: Diagnosis not present

## 2020-11-22 DIAGNOSIS — J069 Acute upper respiratory infection, unspecified: Secondary | ICD-10-CM | POA: Diagnosis not present

## 2020-11-22 DIAGNOSIS — Z1152 Encounter for screening for COVID-19: Secondary | ICD-10-CM | POA: Diagnosis not present

## 2021-01-05 DIAGNOSIS — M48061 Spinal stenosis, lumbar region without neurogenic claudication: Secondary | ICD-10-CM | POA: Diagnosis not present

## 2021-01-05 DIAGNOSIS — Z981 Arthrodesis status: Secondary | ICD-10-CM | POA: Diagnosis not present

## 2021-01-05 DIAGNOSIS — M47816 Spondylosis without myelopathy or radiculopathy, lumbar region: Secondary | ICD-10-CM | POA: Diagnosis not present

## 2021-01-27 DIAGNOSIS — E785 Hyperlipidemia, unspecified: Secondary | ICD-10-CM | POA: Diagnosis not present

## 2021-01-27 DIAGNOSIS — Z125 Encounter for screening for malignant neoplasm of prostate: Secondary | ICD-10-CM | POA: Diagnosis not present

## 2021-02-03 DIAGNOSIS — E785 Hyperlipidemia, unspecified: Secondary | ICD-10-CM | POA: Diagnosis not present

## 2021-02-03 DIAGNOSIS — Z1331 Encounter for screening for depression: Secondary | ICD-10-CM | POA: Diagnosis not present

## 2021-02-03 DIAGNOSIS — Z79899 Other long term (current) drug therapy: Secondary | ICD-10-CM | POA: Diagnosis not present

## 2021-02-03 DIAGNOSIS — Z8546 Personal history of malignant neoplasm of prostate: Secondary | ICD-10-CM | POA: Diagnosis not present

## 2021-02-03 DIAGNOSIS — M48061 Spinal stenosis, lumbar region without neurogenic claudication: Secondary | ICD-10-CM | POA: Diagnosis not present

## 2021-02-03 DIAGNOSIS — Z0000 Encounter for general adult medical examination without abnormal findings: Secondary | ICD-10-CM | POA: Diagnosis not present

## 2021-02-03 DIAGNOSIS — Z1339 Encounter for screening examination for other mental health and behavioral disorders: Secondary | ICD-10-CM | POA: Diagnosis not present

## 2021-02-03 DIAGNOSIS — N1831 Chronic kidney disease, stage 3a: Secondary | ICD-10-CM | POA: Diagnosis not present

## 2021-02-03 DIAGNOSIS — M1990 Unspecified osteoarthritis, unspecified site: Secondary | ICD-10-CM | POA: Diagnosis not present

## 2021-02-03 DIAGNOSIS — G4700 Insomnia, unspecified: Secondary | ICD-10-CM | POA: Diagnosis not present

## 2021-02-03 DIAGNOSIS — Z1212 Encounter for screening for malignant neoplasm of rectum: Secondary | ICD-10-CM | POA: Diagnosis not present

## 2021-02-03 DIAGNOSIS — R82998 Other abnormal findings in urine: Secondary | ICD-10-CM | POA: Diagnosis not present

## 2021-02-03 DIAGNOSIS — Z638 Other specified problems related to primary support group: Secondary | ICD-10-CM | POA: Diagnosis not present

## 2021-02-24 DIAGNOSIS — H524 Presbyopia: Secondary | ICD-10-CM | POA: Diagnosis not present

## 2021-02-24 DIAGNOSIS — H5203 Hypermetropia, bilateral: Secondary | ICD-10-CM | POA: Diagnosis not present

## 2021-02-24 DIAGNOSIS — H52221 Regular astigmatism, right eye: Secondary | ICD-10-CM | POA: Diagnosis not present

## 2021-02-24 DIAGNOSIS — H25813 Combined forms of age-related cataract, bilateral: Secondary | ICD-10-CM | POA: Diagnosis not present

## 2021-04-06 DIAGNOSIS — N1831 Chronic kidney disease, stage 3a: Secondary | ICD-10-CM | POA: Diagnosis not present

## 2021-04-06 DIAGNOSIS — M48062 Spinal stenosis, lumbar region with neurogenic claudication: Secondary | ICD-10-CM | POA: Diagnosis not present

## 2021-04-06 DIAGNOSIS — M4310 Spondylolisthesis, site unspecified: Secondary | ICD-10-CM | POA: Diagnosis not present

## 2021-04-06 DIAGNOSIS — M48061 Spinal stenosis, lumbar region without neurogenic claudication: Secondary | ICD-10-CM | POA: Diagnosis not present

## 2021-04-06 DIAGNOSIS — Z981 Arthrodesis status: Secondary | ICD-10-CM | POA: Diagnosis not present

## 2021-04-06 DIAGNOSIS — D509 Iron deficiency anemia, unspecified: Secondary | ICD-10-CM | POA: Diagnosis not present

## 2021-04-11 DIAGNOSIS — Z833 Family history of diabetes mellitus: Secondary | ICD-10-CM | POA: Diagnosis not present

## 2021-04-11 DIAGNOSIS — Z20822 Contact with and (suspected) exposure to covid-19: Secondary | ICD-10-CM | POA: Diagnosis not present

## 2021-04-11 DIAGNOSIS — Z8616 Personal history of COVID-19: Secondary | ICD-10-CM | POA: Diagnosis not present

## 2021-04-11 DIAGNOSIS — Z79899 Other long term (current) drug therapy: Secondary | ICD-10-CM | POA: Diagnosis not present

## 2021-04-11 DIAGNOSIS — M4316 Spondylolisthesis, lumbar region: Secondary | ICD-10-CM | POA: Diagnosis not present

## 2021-04-11 DIAGNOSIS — Z981 Arthrodesis status: Secondary | ICD-10-CM | POA: Diagnosis not present

## 2021-04-11 DIAGNOSIS — Z9079 Acquired absence of other genital organ(s): Secondary | ICD-10-CM | POA: Diagnosis not present

## 2021-04-11 DIAGNOSIS — Z8546 Personal history of malignant neoplasm of prostate: Secondary | ICD-10-CM | POA: Diagnosis not present

## 2021-04-11 DIAGNOSIS — N1831 Chronic kidney disease, stage 3a: Secondary | ICD-10-CM | POA: Diagnosis not present

## 2021-04-11 DIAGNOSIS — Z7982 Long term (current) use of aspirin: Secondary | ICD-10-CM | POA: Diagnosis not present

## 2021-04-11 DIAGNOSIS — M5135 Other intervertebral disc degeneration, thoracolumbar region: Secondary | ICD-10-CM | POA: Diagnosis not present

## 2021-04-11 DIAGNOSIS — Z87891 Personal history of nicotine dependence: Secondary | ICD-10-CM | POA: Diagnosis not present

## 2021-04-11 DIAGNOSIS — Z8249 Family history of ischemic heart disease and other diseases of the circulatory system: Secondary | ICD-10-CM | POA: Diagnosis not present

## 2021-04-11 DIAGNOSIS — M48062 Spinal stenosis, lumbar region with neurogenic claudication: Secondary | ICD-10-CM | POA: Diagnosis not present

## 2021-04-26 DIAGNOSIS — Z4802 Encounter for removal of sutures: Secondary | ICD-10-CM | POA: Diagnosis not present

## 2021-04-28 DIAGNOSIS — Z9889 Other specified postprocedural states: Secondary | ICD-10-CM | POA: Diagnosis not present

## 2021-04-28 DIAGNOSIS — R21 Rash and other nonspecific skin eruption: Secondary | ICD-10-CM | POA: Diagnosis not present

## 2021-04-28 DIAGNOSIS — N1831 Chronic kidney disease, stage 3a: Secondary | ICD-10-CM | POA: Diagnosis not present

## 2021-04-28 DIAGNOSIS — T50905A Adverse effect of unspecified drugs, medicaments and biological substances, initial encounter: Secondary | ICD-10-CM | POA: Diagnosis not present

## 2021-05-10 DIAGNOSIS — Z981 Arthrodesis status: Secondary | ICD-10-CM | POA: Diagnosis not present

## 2021-05-10 DIAGNOSIS — M48062 Spinal stenosis, lumbar region with neurogenic claudication: Secondary | ICD-10-CM | POA: Diagnosis not present

## 2021-05-10 DIAGNOSIS — M4316 Spondylolisthesis, lumbar region: Secondary | ICD-10-CM | POA: Diagnosis not present

## 2021-05-24 ENCOUNTER — Encounter: Payer: Self-pay | Admitting: Internal Medicine

## 2021-05-31 DIAGNOSIS — H18413 Arcus senilis, bilateral: Secondary | ICD-10-CM | POA: Diagnosis not present

## 2021-05-31 DIAGNOSIS — H25043 Posterior subcapsular polar age-related cataract, bilateral: Secondary | ICD-10-CM | POA: Diagnosis not present

## 2021-05-31 DIAGNOSIS — H2513 Age-related nuclear cataract, bilateral: Secondary | ICD-10-CM | POA: Diagnosis not present

## 2021-05-31 DIAGNOSIS — H2511 Age-related nuclear cataract, right eye: Secondary | ICD-10-CM | POA: Diagnosis not present

## 2021-05-31 DIAGNOSIS — H25013 Cortical age-related cataract, bilateral: Secondary | ICD-10-CM | POA: Diagnosis not present

## 2021-06-07 DIAGNOSIS — Z981 Arthrodesis status: Secondary | ICD-10-CM | POA: Diagnosis not present

## 2021-06-07 DIAGNOSIS — M545 Low back pain, unspecified: Secondary | ICD-10-CM | POA: Diagnosis not present

## 2021-06-07 DIAGNOSIS — M4316 Spondylolisthesis, lumbar region: Secondary | ICD-10-CM | POA: Diagnosis not present

## 2021-06-17 DIAGNOSIS — M545 Low back pain, unspecified: Secondary | ICD-10-CM | POA: Diagnosis not present

## 2021-06-18 DIAGNOSIS — Z23 Encounter for immunization: Secondary | ICD-10-CM | POA: Diagnosis not present

## 2021-07-13 DIAGNOSIS — M4316 Spondylolisthesis, lumbar region: Secondary | ICD-10-CM | POA: Diagnosis not present

## 2021-07-13 DIAGNOSIS — M62830 Muscle spasm of back: Secondary | ICD-10-CM | POA: Diagnosis not present

## 2021-07-13 DIAGNOSIS — Z981 Arthrodesis status: Secondary | ICD-10-CM | POA: Diagnosis not present

## 2021-07-20 DIAGNOSIS — M4724 Other spondylosis with radiculopathy, thoracic region: Secondary | ICD-10-CM | POA: Diagnosis not present

## 2021-07-20 DIAGNOSIS — Z981 Arthrodesis status: Secondary | ICD-10-CM | POA: Diagnosis not present

## 2021-07-20 DIAGNOSIS — M62830 Muscle spasm of back: Secondary | ICD-10-CM | POA: Diagnosis not present

## 2021-07-26 DIAGNOSIS — M5414 Radiculopathy, thoracic region: Secondary | ICD-10-CM | POA: Diagnosis not present

## 2021-08-15 DIAGNOSIS — H2511 Age-related nuclear cataract, right eye: Secondary | ICD-10-CM | POA: Diagnosis not present

## 2021-08-15 DIAGNOSIS — H52201 Unspecified astigmatism, right eye: Secondary | ICD-10-CM | POA: Diagnosis not present

## 2021-08-16 DIAGNOSIS — H2512 Age-related nuclear cataract, left eye: Secondary | ICD-10-CM | POA: Diagnosis not present

## 2021-08-29 DIAGNOSIS — H52202 Unspecified astigmatism, left eye: Secondary | ICD-10-CM | POA: Diagnosis not present

## 2021-08-29 DIAGNOSIS — H2512 Age-related nuclear cataract, left eye: Secondary | ICD-10-CM | POA: Diagnosis not present

## 2021-09-22 DIAGNOSIS — H5203 Hypermetropia, bilateral: Secondary | ICD-10-CM | POA: Diagnosis not present

## 2021-09-22 DIAGNOSIS — H52223 Regular astigmatism, bilateral: Secondary | ICD-10-CM | POA: Diagnosis not present

## 2021-09-22 DIAGNOSIS — Z9842 Cataract extraction status, left eye: Secondary | ICD-10-CM | POA: Diagnosis not present

## 2021-09-22 DIAGNOSIS — Z961 Presence of intraocular lens: Secondary | ICD-10-CM | POA: Diagnosis not present

## 2021-09-30 ENCOUNTER — Encounter: Payer: Self-pay | Admitting: Internal Medicine

## 2021-10-09 DIAGNOSIS — M1990 Unspecified osteoarthritis, unspecified site: Secondary | ICD-10-CM | POA: Diagnosis not present

## 2021-10-09 DIAGNOSIS — N1831 Chronic kidney disease, stage 3a: Secondary | ICD-10-CM | POA: Diagnosis not present

## 2021-10-09 DIAGNOSIS — E785 Hyperlipidemia, unspecified: Secondary | ICD-10-CM | POA: Diagnosis not present

## 2021-11-14 ENCOUNTER — Encounter: Payer: Self-pay | Admitting: Internal Medicine

## 2021-11-17 ENCOUNTER — Encounter: Payer: Self-pay | Admitting: Internal Medicine

## 2021-11-17 ENCOUNTER — Ambulatory Visit: Payer: PPO | Admitting: Internal Medicine

## 2021-11-17 VITALS — BP 110/70 | HR 57 | Ht 71.0 in | Wt 218.0 lb

## 2021-11-17 DIAGNOSIS — R198 Other specified symptoms and signs involving the digestive system and abdomen: Secondary | ICD-10-CM | POA: Diagnosis not present

## 2021-11-17 DIAGNOSIS — Z86010 Personal history of colonic polyps: Secondary | ICD-10-CM

## 2021-11-17 NOTE — Patient Instructions (Signed)
You have been scheduled for a colonoscopy. Please follow written instructions given to you at your visit today.  ?Please pick up your prep supplies at the pharmacy within the next 1-3 days. ?If you use inhalers (even only as needed), please bring them with you on the day of your procedure. ? ?If you are age 79 or older, your body mass index should be between 23-30. Your Body mass index is 30.4 kg/m?Marland Kitchen If this is out of the aforementioned range listed, please consider follow up with your Primary Care Provider. ? ?The [facility] providers would like to encourage you to use [facility] to communicate with providers for non-urgent requests or questions.  Due to long hold times on the telephone, sending your provider a message by [facility] may be a faster and more efficient way to get a response.  Please allow 48 business hours for a response.  Please remember that this is for non-urgent requests.  ?_______________________________________________________ ? ? ?I appreciate the opportunity to care for you. ?Silvano Rusk, MD, [facility] ?

## 2021-11-17 NOTE — Progress Notes (Signed)
? ?Donald [name] y.o. April 30, 1941 [id number] ? ?Assessment & Plan:  ? ?Encounter Diagnoses  ?Name Primary?  ? History of colonic polyps Yes  ? Change in bowel function   ? ? ?We talked about the pros and cons of proceeding with a colonoscopy for surveillance in his age range.  I think the risk-benefit ratio favors that I suspect his bowel function issues are not anything serious but we will check that out as well.  Further plans pending the above. ? ?The risks and benefits as well as alternatives of endoscopic procedure(s) have been discussed and reviewed. All questions answered. The patient agrees to proceed. ? ? ?CC: Avva, Ravisankar, MD ? ? ? ?Subjective:  ? ?Chief Complaint: History of colon polyps, some constipation ? ?HPI ?81 year old white man with a history of colon polyps who is complaining of some constipation as well.  Polyp history as below: ? ? ?Adenomas removed 2004 (2)  and September 2010 (4 + 2 not recovered). ? 06/21/2012 - 9 polyps removed, 8 recovered. Largest 1 cm. Serrated and tubular adenomas - repeat colon 06/2013 ?11/21/2013 - 3 diminutive ascending polyps removed, 2 recovered - sessile serrated polyp and tubular adenoma  ?02/11/2018 4 polyps removed max 6 mm  Adenomas ? ?The patient had his fifth spine surgery within the past several months or a year and when he was on narcotics he was fairly constipated that is improved but he still thinks he cannot evacuate properly.  "I feel like I need a good cleanout". ? ?He is willing to proceed with a surveillance colonoscopy which is part of the reason he is here for discussion today. ?Allergies  ?Allergen Reactions  ? Iodine Anaphylaxis  ? Ivp Dye [Iodinated Contrast Media] Anaphylaxis  ? ?Current Meds  ?Medication Sig  ? aspirin EC 81 MG tablet Take 81 mg by mouth daily.  ? Multiple Vitamin (MULTIVITAMIN) capsule Take 1 capsule by mouth daily.    ? zolpidem (AMBIEN) 10 MG tablet TAKE 1/2 TABLET AT BEDTIME AS NEEDED FOR SLEEP.  ? ?Past  Medical History:  ?Diagnosis Date  ? Adenomatous polyps 10/2002, 06/2009  ? Allergy   ? Arthritis   ? BPH (benign prostatic hyperplasia)   ? Candidiasis   ? Discitis of lumbar region   ? 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 Hyperlipemia   ? Prostate cancer ([place])   ? Spinal stenosis   ? Wears glasses   ? ?Past Surgical History:  ?Procedure Laterality Date  ? ANTERIOR CERVICAL DECOMP/DISCECTOMY FUSION N/A 09/18/2013  ? Procedure: Anterior cervical decompression fusion, cervical 4-5, cervical 5-6, cervical 6-7 with instrumentation and allograft;  Surgeon: Sinclair Ship, MD;  Location: [facility];  Service: Orthopedics;  Laterality: N/A;  Anterior cervical decompression fusion, cervical 4-5, cervical 5-6, cervical 6-7 with instrumentation and allograft  ? COLONOSCOPY    ? COLONOSCOPY W/ POLYPECTOMY  11/04/2002  ? Sigmoid adenomas x2, 1 cm maximum  ? COLONOSCOPY W/ POLYPECTOMY  06/08/2009  ? 6 polyps, largest 8 mm, at least 3 adenomas  ? CYSTOSCOPY  04/2010  ? ESOPHAGOGASTRODUODENOSCOPY  11/04/2002  ? Duodenitis, gastritis without H. pylori   ? HIP ARTHROPLASTY  04/2010  ? Left - Dr. Mayer Camel, right 2012  ? INCISION AND DRAINAGE OF WOUND N/A 07/25/2016  ? Procedure: IRRIGATION AND DEBRIDEMENT LUMBAR DISCITIS;  Surgeon: Phylliss Bob, MD;  Location: [facility];  Service: Orthopedics;  Laterality: N/A;  ? JOINT REPLACEMENT    ? LUMBAR LAMINECTOMY/DECOMPRESSION MICRODISCECTOMY N/A 03/30/2016  ? Procedure: LUMBAR 3-4,  LUMBAR 4-5 DECOMPRESSION ;  Surgeon: Phylliss Bob, MD;  Location: [facility];  Service: Orthopedics;  Laterality: N/A;  LUMBAR 3-4, LUMBAR 4-5 DECOMPRESSION   ? LUMBAR WOUND DEBRIDEMENT N/A 06/29/2016  ? Procedure: LUMBAR WOUND EXPLORATION AND IRRIGATION AND  DEBRIDEMENT;  Surgeon: Phylliss Bob, MD;  Location: [facility];  Service: Orthopedics;  Laterality: N/A;  ? POLYPECTOMY    ? PROSTATE SURGERY    ? RETROPUBIC PROSTATECTOMY  09/2003  ? TONSILLECTOMY    ? UPPER GASTROINTESTINAL ENDOSCOPY    ? ?Social History  ? ?Social History  Narrative  ? Exercise do Tai Chi daily and walking  ? Married 1 son  ? Education: College  ? Retired  ? No alcohol 1 caffeinated beverages no drug use or tobacco  ? ?family history includes Cancer in his brother; Diabetes in his mother; Heart disease in his mother; Prostate cancer in his father. ? ? ?Review of Systems ? ?See HPI he still has some back pain and arthritis symptoms though the most recent surgery did alleviate a significant portion.  Otherwise negative. ?Objective:  ? Physical Exam ?@BP  110/70   Pulse (!) 57   Ht 5' 11"  (1.803 m)   Wt 218 lb (98.9 kg)   BMI 30.40 kg/m? @ ? ?General:  NAD ?Eyes:   anicteric ?Lungs:  clear ?Heart::  S1S2 no rubs, murmurs or gallops ?Abdomen:  soft and nontender, BS+ + diastasis recti ?Ext:   no edema, cyanosis or clubbing ? ? ? ?Data Reviewed:  ? ?See HPI ?

## 2021-12-08 DIAGNOSIS — H5203 Hypermetropia, bilateral: Secondary | ICD-10-CM | POA: Diagnosis not present

## 2021-12-08 DIAGNOSIS — Z9842 Cataract extraction status, left eye: Secondary | ICD-10-CM | POA: Diagnosis not present

## 2021-12-08 DIAGNOSIS — Z961 Presence of intraocular lens: Secondary | ICD-10-CM | POA: Diagnosis not present

## 2021-12-08 DIAGNOSIS — H52223 Regular astigmatism, bilateral: Secondary | ICD-10-CM | POA: Diagnosis not present

## 2021-12-21 DIAGNOSIS — Z85828 Personal history of other malignant neoplasm of skin: Secondary | ICD-10-CM | POA: Diagnosis not present

## 2021-12-21 DIAGNOSIS — B356 Tinea cruris: Secondary | ICD-10-CM | POA: Diagnosis not present

## 2022-01-01 ENCOUNTER — Encounter: Payer: Self-pay | Admitting: Certified Registered Nurse Anesthetist

## 2022-01-09 ENCOUNTER — Ambulatory Visit (AMBULATORY_SURGERY_CENTER): Payer: PPO | Admitting: Internal Medicine

## 2022-01-09 ENCOUNTER — Encounter: Payer: Self-pay | Admitting: Internal Medicine

## 2022-01-09 VITALS — BP 111/75 | HR 52 | Temp 97.1°F | Resp 12 | Ht 71.0 in | Wt 218.0 lb

## 2022-01-09 DIAGNOSIS — D122 Benign neoplasm of ascending colon: Secondary | ICD-10-CM | POA: Diagnosis not present

## 2022-01-09 DIAGNOSIS — D124 Benign neoplasm of descending colon: Secondary | ICD-10-CM

## 2022-01-09 DIAGNOSIS — D125 Benign neoplasm of sigmoid colon: Secondary | ICD-10-CM

## 2022-01-09 DIAGNOSIS — Z86010 Personal history of colonic polyps: Secondary | ICD-10-CM

## 2022-01-09 MED ORDER — SODIUM CHLORIDE 0.9 % IV SOLN: 500.0000 mL | Freq: Once | INTRAVENOUS | Status: DC

## 2022-01-09 NOTE — Op Note (Signed)
[facility] ?Patient Name: Donald [name] ?Procedure Date: 01/09/2022 2:57 PM ?MRN: [id number] ?Endoscopist: Gatha Mayer , MD ?Age: 81 ?Referring MD:  ?Date of Birth: 1941-05-02 ?Gender: Male ?Account #: [id number] ?Procedure:                Colonoscopy ?Indications:              Surveillance: Personal history of adenomatous  ?                          polyps on last colonoscopy > 3 years ago ?Medicines:                Monitored Anesthesia Care ?Procedure:                Pre-Anesthesia Assessment: ?                          - Prior to the procedure, a History and Physical  ?                          was performed, and patient medications and  ?                          allergies were reviewed. The patient's tolerance of  ?                          previous anesthesia was also reviewed. The risks  ?                          and benefits of the procedure and the sedation  ?                          options and risks were discussed with the patient.  ?                          All questions were answered, and informed consent  ?                          was obtained. Prior Anticoagulants: The patient has  ?                          taken no previous anticoagulant or antiplatelet  ?                          agents. ASA Grade Assessment: II - A patient with  ?                          mild systemic disease. After reviewing the risks  ?                          and benefits, the patient was deemed in  ?                          satisfactory condition to undergo the procedure. ?  After obtaining informed consent, the colonoscope  ?                          was passed under direct vision. Throughout the  ?                          procedure, the patient's blood pressure, pulse, and  ?                          oxygen saturations were monitored continuously. The  ?                          Olympus CF-HQ190L Colonscope was introduced through  ?                          the anus and advanced  to the the cecum, identified  ?                          by appendiceal orifice and ileocecal valve. The  ?                          colonoscopy was performed without difficulty. The  ?                          patient tolerated the procedure well. The quality  ?                          of the bowel preparation was good. The ileocecal  ?                          valve, appendiceal orifice, and rectum were  ?                          photographed. The bowel preparation used was  ?                          Miralax via split dose instruction. ?Scope In: 3:11:08 PM ?Scope Out: 3:28:14 PM ?Scope Withdrawal Time: 0 hours 13 minutes 21 seconds  ?Total Procedure Duration: 0 hours 17 minutes 6 seconds  ?Findings:                 The digital exam findings include surgically absent  ?                          prostate. ?                          Three sessile polyps were found in the sigmoid  ?                          colon, descending colon and ascending colon. The  ?                          polyps were diminutive in size. These polyps were  ?  removed with a cold snare. Resection and retrieval  ?                          were complete. Verification of patient  ?                          identification for the specimen was done. Estimated  ?                          blood loss was minimal. ?                          The exam was otherwise without abnormality on  ?                          direct and retroflexion views. ?Complications:            No immediate complications. ?Estimated Blood Loss:     Estimated blood loss was minimal. ?Impression:               - A surgically absent prostate found on digital  ?                          exam. ?                          - Three diminutive polyps in the sigmoid colon, in  ?                          the descending colon and in the ascending colon,  ?                          removed with a cold snare. Resected and retrieved. ?                          -  The examination was otherwise normal on direct  ?                          and retroflexion views. ?Recommendation:           - Patient has a contact number available for  ?                          emergencies. The signs and symptoms of potential  ?                          delayed complications were discussed with the  ?                          patient. Return to normal activities tomorrow.  ?                          Written discharge instructions were provided to the  ?                          patient. ?                          -  Resume previous diet. ?                          - Continue present medications. ?                          - Await pathology results. ?                          - No repeat colonoscopy due to age. ?Gatha Mayer, MD ?01/09/2022 3:37:47 PM ?This report has been signed electronically. ?

## 2022-01-09 NOTE — Progress Notes (Signed)
Called to room to assist during endoscopic procedure.  Patient ID and intended procedure confirmed with present staff. Received instructions for my participation in the procedure from the performing physician.  

## 2022-01-09 NOTE — Progress Notes (Signed)
[facility] History and Physical ? ? ?Primary Care Physician:  Prince Solian, MD ? ? ?Reason for Procedure:   Hx colon polyps ? ?Plan:    colonoscopy ? ? ? ? ?HPI: Donald [name] is a 81 y.o. male  with a history of colon polyps who is complaining of some constipation as well.  Polyp history as below: ?  ?  ?Adenomas removed 2004 (2)  and September 2010 (4 + 2 not recovered). ? 06/21/2012 - 9 polyps removed, 8 recovered. Largest 1 cm. Serrated and tubular adenomas - repeat colon 06/2013 ?11/21/2013 - 3 diminutive ascending polyps removed, 2 recovered - sessile serrated polyp and tubular adenoma  ?02/11/2018 4 polyps removed max 6 mm  Adenomas ?  ?The patient had his fifth spine surgery within the past several months or a year and when he was on narcotics he was fairly constipated that is improved but he still thinks he cannot evacuate properly.  "I feel like I need a good cleanout". ?  ?He is willing to proceed with a surveillance colonoscopy which is part of the reason he is here for discussion today. ? ? ? ?Past Medical History:  ?Diagnosis Date  ? Adenomatous polyps 10/2002, 06/2009  ? Allergy   ? Arthritis   ? BPH (benign prostatic hyperplasia)   ? Candidiasis   ? Cataract   ? Discitis of lumbar region   ? 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 Hyperlipemia   ? Prostate cancer ([place])   ? Spinal stenosis   ? Wears glasses   ? ? ?Past Surgical History:  ?Procedure Laterality Date  ? ANTERIOR CERVICAL DECOMP/DISCECTOMY FUSION N/A 09/18/2013  ? Procedure: Anterior cervical decompression fusion, cervical 4-5, cervical 5-6, cervical 6-7 with instrumentation and allograft;  Surgeon: Sinclair Ship, MD;  Location: [facility];  Service: Orthopedics;  Laterality: N/A;  Anterior cervical decompression fusion, cervical 4-5, cervical 5-6, cervical 6-7 with instrumentation and allograft  ? COLONOSCOPY    ? COLONOSCOPY W/ POLYPECTOMY  11/04/2002  ? Sigmoid adenomas x2, 1 cm maximum  ? COLONOSCOPY W/ POLYPECTOMY   06/08/2009  ? 6 polyps, largest 8 mm, at least 3 adenomas  ? CYSTOSCOPY  04/2010  ? ESOPHAGOGASTRODUODENOSCOPY  11/04/2002  ? Duodenitis, gastritis without H. pylori   ? HIP ARTHROPLASTY  04/2010  ? Left - Dr. Mayer Camel, right 2012  ? INCISION AND DRAINAGE OF WOUND N/A 07/25/2016  ? Procedure: IRRIGATION AND DEBRIDEMENT LUMBAR DISCITIS;  Surgeon: Phylliss Bob, MD;  Location: [facility];  Service: Orthopedics;  Laterality: N/A;  ? JOINT REPLACEMENT    ? LUMBAR LAMINECTOMY/DECOMPRESSION MICRODISCECTOMY N/A 03/30/2016  ? Procedure: LUMBAR 3-4, LUMBAR 4-5 DECOMPRESSION ;  Surgeon: Phylliss Bob, MD;  Location: [facility];  Service: Orthopedics;  Laterality: N/A;  LUMBAR 3-4, LUMBAR 4-5 DECOMPRESSION   ? LUMBAR WOUND DEBRIDEMENT N/A 06/29/2016  ? Procedure: LUMBAR WOUND EXPLORATION AND IRRIGATION AND  DEBRIDEMENT;  Surgeon: Phylliss Bob, MD;  Location: [facility];  Service: Orthopedics;  Laterality: N/A;  ? POLYPECTOMY    ? PROSTATE SURGERY    ? RETROPUBIC PROSTATECTOMY  09/2003  ? TONSILLECTOMY    ? UPPER GASTROINTESTINAL ENDOSCOPY    ? ? ?Prior to Admission medications   ?Medication Sig Start Date End Date Taking? Authorizing Provider  ?aspirin EC 81 MG tablet Take 81 mg by mouth daily.   Yes [provider]  ?Multiple Vitamin (MULTIVITAMIN) capsule Take 1 capsule by mouth daily.     Yes [provider]  ?rosuvastatin (CRESTOR) 5 MG  tablet Take 5 mg by mouth.   Yes [provider]  ?zolpidem (AMBIEN) 10 MG tablet TAKE 1/2 TABLET AT BEDTIME AS NEEDED FOR SLEEP. 06/11/17  Yes [name], MD  ? ? ?Current Outpatient Medications  ?Medication Sig Dispense Refill  ? aspirin EC 81 MG tablet Take 81 mg by mouth daily.    ? Multiple Vitamin (MULTIVITAMIN) capsule Take 1 capsule by mouth daily.      ? rosuvastatin (CRESTOR) 5 MG tablet Take 5 mg by mouth.    ? zolpidem (AMBIEN) 10 MG tablet TAKE 1/2 TABLET AT BEDTIME AS NEEDED FOR SLEEP. 45 tablet 0  ? ?Current Facility-Administered Medications  ?Medication Dose  Route Frequency Provider Last Rate Last Admin  ? 0.9 %  sodium chloride infusion  500 mL Intravenous Once Gatha Mayer, MD      ? ? ?Allergies as of 01/09/2022 - Review Complete 01/09/2022  ?Allergen Reaction Noted  ? Iodine Anaphylaxis 12/14/2010  ? Ivp dye [iodinated contrast media] Anaphylaxis 10/19/2011  ? ? ?Family History  ?Problem Relation Age of Onset  ? Diabetes Mother   ? Heart disease Mother   ? Prostate cancer Father   ? Cancer Brother   ?     prostate  ? Colon cancer Neg Hx   ? Stomach cancer Neg Hx   ? Colon polyps Neg Hx   ? Rectal cancer Neg Hx   ? Esophageal cancer Neg Hx   ? ? ?Social History  ? ?Socioeconomic History  ? Marital status: Married  ?  Spouse name: Not on file  ? Number of children: Not on file  ? Years of education: Not on file  ? Highest education level: Not on file  ?Occupational History  ? Occupation: Retired  ?  Comment: Self-Employed  ?Tobacco Use  ? Smoking status: Former  ?  Types: Cigars  ?  Quit date: 09/12/1972  ?  Years since quitting: 49.3  ? Smokeless tobacco: Never  ?Vaping Use  ? Vaping Use: Never used  ?Substance and Sexual Activity  ? Alcohol use: Yes  ?  Alcohol/week: 2.0 standard drinks  ?  Types: 2 Cans of beer per week  ?  Comment: 1-2 beers a week  ? Drug use: No  ? Sexual activity: Yes  ?  Comment: number of sex partners in the last months 1, current birth control method - no prostate  ?Other Topics Concern  ? Not on file  ?Social History Narrative  ? Exercise do Tai Chi daily and walking  ? Married 1 son  ? Education: College  ? Retired  ? No alcohol 1 caffeinated beverages no drug use or tobacco  ? ? ? ? ?Review of Systems: ? ?All other review of systems negative except as mentioned in the HPI. ? ?Physical Exam: ?Vital signs ?BP (!) 141/72   Pulse (!) 52   Temp (!) 97.1 ?F (36.2 ?C)   Ht $R'5\' 11"'Mc$  (1.803 m)   Wt 218 lb (98.9 kg)   SpO2 98%   BMI 30.40 kg/m?  ? ?General:   Alert,  Well-developed, well-nourished, pleasant and cooperative in NAD ?Lungs:   Clear throughout to auscultation.   ?Heart:  Regular rate and rhythm; no murmurs, clicks, rubs,  or gallops. ?Abdomen:  Soft, nontender and nondistended. Normal bowel sounds.   ?Neuro/Psych:  Alert and cooperative. Normal mood and affect. A and O x 3 ? ? ?$Re'@Rilynn Habel'AUc$  Simonne Maffucci, MD, [facility] ?[facility] ?[phone] (pager) ?01/09/2022 3:03 PM@ ? ?

## 2022-01-09 NOTE — Patient Instructions (Addendum)
3 polyps today - all tiny and benign-appearing. ? ?I will have them analyzed and will let you know. ? ?I appreciate the opportunity to care for you. ?Gatha Mayer, MD, [facility] ? ? ? ?YOU HAD AN ENDOSCOPIC PROCEDURE TODAY AT [place]:   Refer to the procedure report that was given to you for any specific questions about what was found during the examination.  If the procedure report does not answer your questions, please call your gastroenterologist to clarify.  If you requested that your care partner not be given the details of your procedure findings, then the procedure report has been included in a sealed envelope for you to review at your convenience later. ? ?YOU SHOULD EXPECT: Some feelings of bloating in the abdomen. Passage of more gas than usual.  Walking can help get rid of the air that was put into your GI tract during the procedure and reduce the bloating. If you had a lower endoscopy (such as a colonoscopy or flexible sigmoidoscopy) you may notice spotting of blood in your stool or on the toilet paper. If you underwent a bowel prep for your procedure, you may not have a normal bowel movement for a few days. ? ?Please Note:  You might notice some irritation and congestion in your nose or some drainage.  This is from the oxygen used during your procedure.  There is no need for concern and it should clear up in a day or so. ? ?SYMPTOMS TO REPORT IMMEDIATELY: ? ?Following lower endoscopy (colonoscopy or flexible sigmoidoscopy): ? Excessive amounts of blood in the stool ? Significant tenderness or worsening of abdominal pains ? Swelling of the abdomen that is new, acute ? Fever of 100?F or higher ? ?For urgent or emergent issues, a gastroenterologist can be reached at any hour by calling [phone]. ?Do not use MyChart messaging for urgent concerns.  ? ? ?DIET:  We do recommend a small meal at first, but then you may proceed to your regular diet.  Drink plenty of fluids but you should  avoid alcoholic beverages for 24 hours. ? ?ACTIVITY:  You should plan to take it easy for the rest of today and you should NOT DRIVE or use heavy machinery until tomorrow (because of the sedation medicines used during the test).   ? ?FOLLOW UP: ?Our staff will call the number listed on your records 48-72 hours following your procedure to check on you and address any questions or concerns that you may have regarding the information given to you following your procedure. If we do not reach you, we will leave a message.  We will attempt to reach you two times.  During this call, we will ask if you have developed any symptoms of COVID 19. If you develop any symptoms (ie: fever, flu-like symptoms, shortness of breath, cough etc.) before then, please call [phone].  If you test positive for Covid 19 in the 2 weeks post procedure, please call and report this information to [place].   ? ?If any biopsies were taken you will be contacted by phone or by letter within the next 1-3 weeks.  Please call us at [phone] if you have not heard about the biopsies in 3 weeks.  ? ? ?SIGNATURES/CONFIDENTIALITY: ?You and/or your care partner have signed paperwork which will be entered into your electronic medical record.  These signatures attest to the fact that that the information above on your After Visit Summary has been reviewed and is understood.  Full responsibility of the confidentiality of this discharge information lies with you and/or your care-partner.  ?

## 2022-01-09 NOTE — Progress Notes (Signed)
Report given to PACU, vss 

## 2022-01-11 ENCOUNTER — Telehealth: Payer: Self-pay | Admitting: *Deleted

## 2022-01-11 NOTE — Telephone Encounter (Signed)
?  Follow up Call- ? ? ?  01/09/2022  ?  2:18 PM  ?Call back number  ?Post procedure Call Back phone  # [phone]  ?Permission to leave phone message Yes  ?  ? ?Patient questions: ? ?Do you have a fever, pain , or abdominal swelling? No. ?Pain Score  0 * ? ?Have you tolerated food without any problems? Yes.   ? ?Have you been able to return to your normal activities? Yes.   ? ?Do you have any questions about your discharge instructions: ?Diet   No. ?Medications  No. ?Follow up visit  No. ? ?Do you have questions or concerns about your Care? No. ? ?Actions: ?* If pain score is 4 or above: ?No action needed, pain <4. ? ? ?

## 2022-01-16 ENCOUNTER — Encounter: Payer: Self-pay | Admitting: Internal Medicine

## 2022-02-03 DIAGNOSIS — H26493 Other secondary cataract, bilateral: Secondary | ICD-10-CM | POA: Diagnosis not present

## 2022-02-03 DIAGNOSIS — H18413 Arcus senilis, bilateral: Secondary | ICD-10-CM | POA: Diagnosis not present

## 2022-02-03 DIAGNOSIS — H26492 Other secondary cataract, left eye: Secondary | ICD-10-CM | POA: Diagnosis not present

## 2022-02-03 DIAGNOSIS — H02831 Dermatochalasis of right upper eyelid: Secondary | ICD-10-CM | POA: Diagnosis not present

## 2022-02-03 DIAGNOSIS — Z961 Presence of intraocular lens: Secondary | ICD-10-CM | POA: Diagnosis not present

## 2022-02-10 DIAGNOSIS — H26492 Other secondary cataract, left eye: Secondary | ICD-10-CM | POA: Diagnosis not present

## 2022-02-16 DIAGNOSIS — Z125 Encounter for screening for malignant neoplasm of prostate: Secondary | ICD-10-CM | POA: Diagnosis not present

## 2022-02-16 DIAGNOSIS — E785 Hyperlipidemia, unspecified: Secondary | ICD-10-CM | POA: Diagnosis not present

## 2022-02-17 DIAGNOSIS — Z0000 Encounter for general adult medical examination without abnormal findings: Secondary | ICD-10-CM | POA: Diagnosis not present

## 2022-02-21 DIAGNOSIS — H26491 Other secondary cataract, right eye: Secondary | ICD-10-CM | POA: Diagnosis not present

## 2022-02-28 DIAGNOSIS — H26491 Other secondary cataract, right eye: Secondary | ICD-10-CM | POA: Diagnosis not present

## 2022-03-02 DIAGNOSIS — Z1331 Encounter for screening for depression: Secondary | ICD-10-CM | POA: Diagnosis not present

## 2022-03-02 DIAGNOSIS — D126 Benign neoplasm of colon, unspecified: Secondary | ICD-10-CM | POA: Diagnosis not present

## 2022-03-02 DIAGNOSIS — R82998 Other abnormal findings in urine: Secondary | ICD-10-CM | POA: Diagnosis not present

## 2022-03-02 DIAGNOSIS — M48061 Spinal stenosis, lumbar region without neurogenic claudication: Secondary | ICD-10-CM | POA: Diagnosis not present

## 2022-03-02 DIAGNOSIS — N1831 Chronic kidney disease, stage 3a: Secondary | ICD-10-CM | POA: Diagnosis not present

## 2022-03-02 DIAGNOSIS — E785 Hyperlipidemia, unspecified: Secondary | ICD-10-CM | POA: Diagnosis not present

## 2022-03-02 DIAGNOSIS — Z23 Encounter for immunization: Secondary | ICD-10-CM | POA: Diagnosis not present

## 2022-03-02 DIAGNOSIS — Z1339 Encounter for screening examination for other mental health and behavioral disorders: Secondary | ICD-10-CM | POA: Diagnosis not present

## 2022-03-02 DIAGNOSIS — M1990 Unspecified osteoarthritis, unspecified site: Secondary | ICD-10-CM | POA: Diagnosis not present

## 2022-03-02 DIAGNOSIS — Z8546 Personal history of malignant neoplasm of prostate: Secondary | ICD-10-CM | POA: Diagnosis not present

## 2022-03-02 DIAGNOSIS — Z638 Other specified problems related to primary support group: Secondary | ICD-10-CM | POA: Diagnosis not present

## 2022-03-02 DIAGNOSIS — Z0000 Encounter for general adult medical examination without abnormal findings: Secondary | ICD-10-CM | POA: Diagnosis not present

## 2022-04-12 DIAGNOSIS — Z981 Arthrodesis status: Secondary | ICD-10-CM | POA: Diagnosis not present

## 2022-04-12 DIAGNOSIS — M4316 Spondylolisthesis, lumbar region: Secondary | ICD-10-CM | POA: Diagnosis not present

## 2022-06-24 DIAGNOSIS — Z23 Encounter for immunization: Secondary | ICD-10-CM | POA: Diagnosis not present

## 2022-08-22 DIAGNOSIS — H52223 Regular astigmatism, bilateral: Secondary | ICD-10-CM | POA: Diagnosis not present

## 2022-08-22 DIAGNOSIS — H53143 Visual discomfort, bilateral: Secondary | ICD-10-CM | POA: Diagnosis not present

## 2022-08-22 DIAGNOSIS — H5203 Hypermetropia, bilateral: Secondary | ICD-10-CM | POA: Diagnosis not present

## 2022-08-22 DIAGNOSIS — H524 Presbyopia: Secondary | ICD-10-CM | POA: Diagnosis not present

## 2022-08-22 DIAGNOSIS — Z961 Presence of intraocular lens: Secondary | ICD-10-CM | POA: Diagnosis not present

## 2022-08-22 DIAGNOSIS — Z9849 Cataract extraction status, unspecified eye: Secondary | ICD-10-CM | POA: Diagnosis not present

## 2022-11-23 ENCOUNTER — Ambulatory Visit (INDEPENDENT_AMBULATORY_CARE_PROVIDER_SITE_OTHER): Payer: HMO | Admitting: Podiatry

## 2022-11-23 DIAGNOSIS — L97521 Non-pressure chronic ulcer of other part of left foot limited to breakdown of skin: Secondary | ICD-10-CM | POA: Diagnosis not present

## 2022-11-23 MED ORDER — CEPHALEXIN 500 MG PO CAPS: 500.0000 mg | ORAL_CAPSULE | Freq: Three times a day (TID) | ORAL | 0 refills | Status: AC

## 2022-11-23 MED ORDER — MUPIROCIN 2 % EX OINT: 1.0000 | TOPICAL_OINTMENT | Freq: Two times a day (BID) | CUTANEOUS | 2 refills | Status: AC

## 2022-11-23 NOTE — Progress Notes (Signed)
Subjective:   Patient ID: Donald [name], male   DOB: 82 y.o.   MRN: [id number]   HPI Chief Complaint  Patient presents with   Plantar Warts    Left foot, bottom of foot near the 5th digit, patient stated pain occurs at the end of the day due to the wart and where its placed on the foot    82 y.o. male with the above concerns. It started about 1 months ago. He has tried OTC pads and salicylic acid but he has noticed any improvement. It has been getting worse. It is very painful at the end of the day. No drainage. No swelling. No injuries.    Review of Systems  All other systems reviewed and are negative.  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   Hyperlipemia    Prostate cancer ([place])    Spinal stenosis    Wears glasses     Past Surgical History:  Procedure Laterality Date   ANTERIOR CERVICAL DECOMP/DISCECTOMY FUSION N/A 09/18/2013   Procedure: Anterior cervical decompression fusion, cervical 4-5, cervical 5-6, cervical 6-7 with instrumentation and allograft;  Surgeon: Sinclair Ship, MD;  Location: [facility];  Service: Orthopedics;  Laterality: N/A;  Anterior cervical decompression fusion, cervical 4-5, cervical 5-6, cervical 6-7 with instrumentation and allograft   COLONOSCOPY     COLONOSCOPY W/ POLYPECTOMY  11/04/2002   Sigmoid adenomas x2, 1 cm maximum   COLONOSCOPY W/ POLYPECTOMY  06/08/2009   6 polyps, largest 8 mm, at least 3 adenomas   CYSTOSCOPY  04/2010   ESOPHAGOGASTRODUODENOSCOPY  11/04/2002   Duodenitis, gastritis without H. pylori    HIP ARTHROPLASTY  04/2010   Left - Dr. Mayer Camel, right 2012   INCISION AND DRAINAGE OF WOUND N/A 07/25/2016   Procedure: IRRIGATION AND DEBRIDEMENT LUMBAR DISCITIS;  Surgeon: Phylliss Bob, MD;  Location: [facility];  Service: Orthopedics;  Laterality:  N/A;   JOINT REPLACEMENT     LUMBAR LAMINECTOMY/DECOMPRESSION MICRODISCECTOMY N/A 03/30/2016   Procedure: LUMBAR 3-4, LUMBAR 4-5 DECOMPRESSION ;  Surgeon: Phylliss Bob, MD;  Location: [facility];  Service: Orthopedics;  Laterality: N/A;  LUMBAR 3-4, LUMBAR 4-5 DECOMPRESSION    LUMBAR WOUND DEBRIDEMENT N/A 06/29/2016   Procedure: LUMBAR WOUND EXPLORATION AND IRRIGATION AND  DEBRIDEMENT;  Surgeon: Phylliss Bob, MD;  Location: [facility];  Service: Orthopedics;  Laterality: N/A;   POLYPECTOMY     PROSTATE SURGERY     RETROPUBIC PROSTATECTOMY  09/2003   TONSILLECTOMY     UPPER GASTROINTESTINAL ENDOSCOPY       Current Outpatient Medications:    aspirin EC 81 MG tablet, Take 81 mg by mouth daily., Disp: , Rfl:    Multiple Vitamin (MULTIVITAMIN) capsule, Take 1 capsule by mouth daily.  , Disp: , Rfl:    rosuvastatin (CRESTOR) 5 MG tablet, Take 5 mg by mouth., Disp: , Rfl:    zolpidem (AMBIEN) 10 MG tablet, TAKE 1/2 TABLET AT BEDTIME AS NEEDED FOR SLEEP., Disp: 45 tablet, Rfl: 0  Allergies  Allergen Reactions   Iodine Anaphylaxis   Ivp Dye [Iodinated Contrast Media] Anaphylaxis          Objective:  Physical Exam  General: AAO x3, NAD  Dermatological: On the left foot submetatarsal 5 is a hyperkeratotic lesion with  macerated tissue prior to debridement" appears to be dried blood, scab.  After debridement which is pictured below superficial granular wound present any probing, and or tunneling.  There is mild surrounding edema with mild localized erythema without any ascending cellulitis.  There is no fluctuance or crepitation.  There is no malodor.     Vascular: Dorsalis Pedis artery and Posterior Tibial artery pedal pulses are 2/4 with immedate capillary fill time.  There is no pain with calf compression, swelling, warmth, erythema.   Neruologic: Grossly intact via light touch bilateral. Vibratory intact via tuning fork bilateral. Protective threshold with Semmes Wienstein monofilament intact  to all pedal sites bilateral. Patellar and Achilles deep tendon reflexes 2+ bilateral. No Babinski or clonus noted bilateral.   Musculoskeletal:  Muscular strength 5/5 in all groups tested bilateral.  Gait: Unassisted, Nonantalgic.       Assessment:   Ulceration left foot    Plan:  -Treatment options discussed including all alternatives, risks, and complications -Etiology of symptoms were discussed -I sharply debrided the hyperkeratotic lesion that was present to reveal the underlying ulceration.  There was no blood loss.  Tolerated well.  Prescribed Keflex as well as mupirocin ointment.  Dispensed offloading pads. -Recommend holding off on any salicylic acid or other creams for now. -Monitor for any clinical signs or symptoms of infection and directed to call the office immediately should any occur or go to the ER.  Return in about 2 weeks (around 12/07/2022).  Trula Slade DPM

## 2022-11-23 NOTE — Patient Instructions (Signed)
He can wash the wound with soap and water, dry well.  Apply mupirocin ointment twice a day covered with a Band-Aid.  Monitor for any signs/symptoms of infection. Call the office immediately if any occur or go directly to the emergency room. Call with any questions/concerns.

## 2022-12-11 ENCOUNTER — Ambulatory Visit (INDEPENDENT_AMBULATORY_CARE_PROVIDER_SITE_OTHER): Payer: HMO | Admitting: Podiatry

## 2022-12-11 DIAGNOSIS — M79672 Pain in left foot: Secondary | ICD-10-CM

## 2022-12-11 DIAGNOSIS — M216X2 Other acquired deformities of left foot: Secondary | ICD-10-CM | POA: Diagnosis not present

## 2022-12-11 DIAGNOSIS — L97521 Non-pressure chronic ulcer of other part of left foot limited to breakdown of skin: Secondary | ICD-10-CM

## 2022-12-11 NOTE — Progress Notes (Signed)
Subjective: Chief Complaint  Patient presents with   Plantar Warts    Left foot, under 5th digit, patient stated theres discomfort there    82 year old male presents with the above concerns.  He states he is doing better but still having some tenderness.  No swelling redness or any drainage.  No open lesions.  Objective: AAO x3, NAD DP/PT pulses palpable bilaterally, CRT less than 3 seconds Hyperkeratotic lesion noted submetatarsal 5 left foot.  There is no definitive open lesion but small dried blood area is preulcerative.  There is prominence of metatarsal head plantarly with atrophy of the fat pad. No pain with calf compression, swelling, warmth, erythema  Assessment: Preulcerative hyperkeratotic lesion, prominent metatarsal head  Plan: -All treatment options discussed with the patient including all alternatives, risks, complications.  -Ulceration appears to have healed.  I sharply debrided the callus with a #[id number] with scalpel to any complications or bleeding.  Continue offloading and padding.  I do think will benefit from orthotics given the prominence of metatarsal head, history of ulceration.  Will submit paperwork to help to [facility]. -Patient encouraged to call the office with any questions, concerns, change in symptoms.   Vivi Barrack DPM

## 2022-12-19 ENCOUNTER — Telehealth: Payer: Self-pay | Admitting: Podiatry

## 2022-12-19 NOTE — Telephone Encounter (Signed)
Prior authorization approved for orthotics , [facility]kley Harvey # [id number] 12/18/22-03/18/23 documentation will be sent to scan center to be uploaded in chart

## 2023-01-02 DIAGNOSIS — L84 Corns and callosities: Secondary | ICD-10-CM | POA: Diagnosis not present

## 2023-01-02 DIAGNOSIS — L300 Nummular dermatitis: Secondary | ICD-10-CM | POA: Diagnosis not present

## 2023-01-02 DIAGNOSIS — Z85828 Personal history of other malignant neoplasm of skin: Secondary | ICD-10-CM | POA: Diagnosis not present

## 2023-03-19 DIAGNOSIS — N1831 Chronic kidney disease, stage 3a: Secondary | ICD-10-CM | POA: Diagnosis not present

## 2023-03-19 DIAGNOSIS — Z125 Encounter for screening for malignant neoplasm of prostate: Secondary | ICD-10-CM | POA: Diagnosis not present

## 2023-03-19 DIAGNOSIS — E785 Hyperlipidemia, unspecified: Secondary | ICD-10-CM | POA: Diagnosis not present

## 2023-03-23 DIAGNOSIS — R82998 Other abnormal findings in urine: Secondary | ICD-10-CM | POA: Diagnosis not present

## 2023-03-23 DIAGNOSIS — Z1331 Encounter for screening for depression: Secondary | ICD-10-CM | POA: Diagnosis not present

## 2023-03-23 DIAGNOSIS — E785 Hyperlipidemia, unspecified: Secondary | ICD-10-CM | POA: Diagnosis not present

## 2023-03-23 DIAGNOSIS — D126 Benign neoplasm of colon, unspecified: Secondary | ICD-10-CM | POA: Diagnosis not present

## 2023-03-23 DIAGNOSIS — M48061 Spinal stenosis, lumbar region without neurogenic claudication: Secondary | ICD-10-CM | POA: Diagnosis not present

## 2023-03-23 DIAGNOSIS — Z0000 Encounter for general adult medical examination without abnormal findings: Secondary | ICD-10-CM | POA: Diagnosis not present

## 2023-03-23 DIAGNOSIS — N1831 Chronic kidney disease, stage 3a: Secondary | ICD-10-CM | POA: Diagnosis not present

## 2023-03-23 DIAGNOSIS — Z638 Other specified problems related to primary support group: Secondary | ICD-10-CM | POA: Diagnosis not present

## 2023-03-23 DIAGNOSIS — Z1339 Encounter for screening examination for other mental health and behavioral disorders: Secondary | ICD-10-CM | POA: Diagnosis not present

## 2023-03-23 DIAGNOSIS — Z8546 Personal history of malignant neoplasm of prostate: Secondary | ICD-10-CM | POA: Diagnosis not present

## 2023-03-23 DIAGNOSIS — G4700 Insomnia, unspecified: Secondary | ICD-10-CM | POA: Diagnosis not present

## 2023-03-23 DIAGNOSIS — M1990 Unspecified osteoarthritis, unspecified site: Secondary | ICD-10-CM | POA: Diagnosis not present

## 2023-03-23 DIAGNOSIS — E038 Other specified hypothyroidism: Secondary | ICD-10-CM | POA: Diagnosis not present

## 2023-05-30 DIAGNOSIS — D485 Neoplasm of uncertain behavior of skin: Secondary | ICD-10-CM | POA: Diagnosis not present

## 2023-09-25 DIAGNOSIS — G4700 Insomnia, unspecified: Secondary | ICD-10-CM | POA: Diagnosis not present

## 2023-09-25 DIAGNOSIS — E785 Hyperlipidemia, unspecified: Secondary | ICD-10-CM | POA: Diagnosis not present

## 2023-09-25 DIAGNOSIS — E038 Other specified hypothyroidism: Secondary | ICD-10-CM | POA: Diagnosis not present

## 2023-09-25 DIAGNOSIS — M48061 Spinal stenosis, lumbar region without neurogenic claudication: Secondary | ICD-10-CM | POA: Diagnosis not present

## 2023-09-25 DIAGNOSIS — M4626 Osteomyelitis of vertebra, lumbar region: Secondary | ICD-10-CM | POA: Diagnosis not present

## 2023-09-25 DIAGNOSIS — N1831 Chronic kidney disease, stage 3a: Secondary | ICD-10-CM | POA: Diagnosis not present

## 2023-09-25 DIAGNOSIS — Z8546 Personal history of malignant neoplasm of prostate: Secondary | ICD-10-CM | POA: Diagnosis not present

## 2023-09-25 DIAGNOSIS — D126 Benign neoplasm of colon, unspecified: Secondary | ICD-10-CM | POA: Diagnosis not present

## 2023-09-25 DIAGNOSIS — M2550 Pain in unspecified joint: Secondary | ICD-10-CM | POA: Diagnosis not present

## 2023-09-25 DIAGNOSIS — M1990 Unspecified osteoarthritis, unspecified site: Secondary | ICD-10-CM | POA: Diagnosis not present

## 2023-09-25 DIAGNOSIS — N183 Chronic kidney disease, stage 3 unspecified: Secondary | ICD-10-CM | POA: Diagnosis not present

## 2023-10-12 DIAGNOSIS — R2681 Unsteadiness on feet: Secondary | ICD-10-CM | POA: Diagnosis not present

## 2023-10-12 DIAGNOSIS — M48061 Spinal stenosis, lumbar region without neurogenic claudication: Secondary | ICD-10-CM | POA: Diagnosis not present

## 2023-10-17 DIAGNOSIS — M48061 Spinal stenosis, lumbar region without neurogenic claudication: Secondary | ICD-10-CM | POA: Diagnosis not present

## 2023-10-17 DIAGNOSIS — R2681 Unsteadiness on feet: Secondary | ICD-10-CM | POA: Diagnosis not present

## 2023-10-23 DIAGNOSIS — M48061 Spinal stenosis, lumbar region without neurogenic claudication: Secondary | ICD-10-CM | POA: Diagnosis not present

## 2023-10-23 DIAGNOSIS — R2681 Unsteadiness on feet: Secondary | ICD-10-CM | POA: Diagnosis not present

## 2023-10-30 DIAGNOSIS — R2681 Unsteadiness on feet: Secondary | ICD-10-CM | POA: Diagnosis not present

## 2023-10-30 DIAGNOSIS — M48061 Spinal stenosis, lumbar region without neurogenic claudication: Secondary | ICD-10-CM | POA: Diagnosis not present

## 2023-11-02 DIAGNOSIS — M48061 Spinal stenosis, lumbar region without neurogenic claudication: Secondary | ICD-10-CM | POA: Diagnosis not present

## 2023-11-02 DIAGNOSIS — R2681 Unsteadiness on feet: Secondary | ICD-10-CM | POA: Diagnosis not present

## 2023-11-08 DIAGNOSIS — R2681 Unsteadiness on feet: Secondary | ICD-10-CM | POA: Diagnosis not present

## 2023-11-08 DIAGNOSIS — M48061 Spinal stenosis, lumbar region without neurogenic claudication: Secondary | ICD-10-CM | POA: Diagnosis not present

## 2023-11-13 DIAGNOSIS — M48061 Spinal stenosis, lumbar region without neurogenic claudication: Secondary | ICD-10-CM | POA: Diagnosis not present

## 2023-11-13 DIAGNOSIS — R2681 Unsteadiness on feet: Secondary | ICD-10-CM | POA: Diagnosis not present

## 2023-11-15 DIAGNOSIS — R2681 Unsteadiness on feet: Secondary | ICD-10-CM | POA: Diagnosis not present

## 2023-11-15 DIAGNOSIS — M48061 Spinal stenosis, lumbar region without neurogenic claudication: Secondary | ICD-10-CM | POA: Diagnosis not present

## 2023-11-20 DIAGNOSIS — R2681 Unsteadiness on feet: Secondary | ICD-10-CM | POA: Diagnosis not present

## 2023-11-20 DIAGNOSIS — M48061 Spinal stenosis, lumbar region without neurogenic claudication: Secondary | ICD-10-CM | POA: Diagnosis not present

## 2023-11-22 DIAGNOSIS — M48061 Spinal stenosis, lumbar region without neurogenic claudication: Secondary | ICD-10-CM | POA: Diagnosis not present

## 2023-11-22 DIAGNOSIS — R2681 Unsteadiness on feet: Secondary | ICD-10-CM | POA: Diagnosis not present

## 2023-11-26 DIAGNOSIS — M48061 Spinal stenosis, lumbar region without neurogenic claudication: Secondary | ICD-10-CM | POA: Diagnosis not present

## 2023-11-26 DIAGNOSIS — R2681 Unsteadiness on feet: Secondary | ICD-10-CM | POA: Diagnosis not present

## 2023-11-29 DIAGNOSIS — M48061 Spinal stenosis, lumbar region without neurogenic claudication: Secondary | ICD-10-CM | POA: Diagnosis not present

## 2023-11-29 DIAGNOSIS — R2681 Unsteadiness on feet: Secondary | ICD-10-CM | POA: Diagnosis not present

## 2023-12-04 DIAGNOSIS — M48061 Spinal stenosis, lumbar region without neurogenic claudication: Secondary | ICD-10-CM | POA: Diagnosis not present

## 2023-12-04 DIAGNOSIS — R2681 Unsteadiness on feet: Secondary | ICD-10-CM | POA: Diagnosis not present

## 2024-01-09 DIAGNOSIS — R202 Paresthesia of skin: Secondary | ICD-10-CM | POA: Diagnosis not present

## 2024-01-09 DIAGNOSIS — R200 Anesthesia of skin: Secondary | ICD-10-CM | POA: Diagnosis not present

## 2024-02-06 DIAGNOSIS — G5603 Carpal tunnel syndrome, bilateral upper limbs: Secondary | ICD-10-CM | POA: Diagnosis not present

## 2024-04-07 DIAGNOSIS — R319 Hematuria, unspecified: Secondary | ICD-10-CM | POA: Diagnosis not present

## 2024-04-15 DIAGNOSIS — E785 Hyperlipidemia, unspecified: Secondary | ICD-10-CM | POA: Diagnosis not present

## 2024-04-15 DIAGNOSIS — E038 Other specified hypothyroidism: Secondary | ICD-10-CM | POA: Diagnosis not present

## 2024-04-15 DIAGNOSIS — Z8546 Personal history of malignant neoplasm of prostate: Secondary | ICD-10-CM | POA: Diagnosis not present

## 2024-04-15 DIAGNOSIS — E039 Hypothyroidism, unspecified: Secondary | ICD-10-CM | POA: Diagnosis not present

## 2024-04-22 DIAGNOSIS — E785 Hyperlipidemia, unspecified: Secondary | ICD-10-CM | POA: Diagnosis not present

## 2024-04-22 DIAGNOSIS — E038 Other specified hypothyroidism: Secondary | ICD-10-CM | POA: Diagnosis not present

## 2024-04-22 DIAGNOSIS — Z638 Other specified problems related to primary support group: Secondary | ICD-10-CM | POA: Diagnosis not present

## 2024-04-22 DIAGNOSIS — Z8546 Personal history of malignant neoplasm of prostate: Secondary | ICD-10-CM | POA: Diagnosis not present

## 2024-04-22 DIAGNOSIS — D126 Benign neoplasm of colon, unspecified: Secondary | ICD-10-CM | POA: Diagnosis not present

## 2024-04-22 DIAGNOSIS — N1831 Chronic kidney disease, stage 3a: Secondary | ICD-10-CM | POA: Diagnosis not present

## 2024-04-22 DIAGNOSIS — G4700 Insomnia, unspecified: Secondary | ICD-10-CM | POA: Diagnosis not present

## 2024-04-22 DIAGNOSIS — M48061 Spinal stenosis, lumbar region without neurogenic claudication: Secondary | ICD-10-CM | POA: Diagnosis not present

## 2024-04-22 DIAGNOSIS — Z1331 Encounter for screening for depression: Secondary | ICD-10-CM | POA: Diagnosis not present

## 2024-04-22 DIAGNOSIS — Z0000 Encounter for general adult medical examination without abnormal findings: Secondary | ICD-10-CM | POA: Diagnosis not present

## 2024-04-22 DIAGNOSIS — M1990 Unspecified osteoarthritis, unspecified site: Secondary | ICD-10-CM | POA: Diagnosis not present

## 2024-04-22 DIAGNOSIS — Z1339 Encounter for screening examination for other mental health and behavioral disorders: Secondary | ICD-10-CM | POA: Diagnosis not present

## 2024-04-22 DIAGNOSIS — R310 Gross hematuria: Secondary | ICD-10-CM | POA: Diagnosis not present

## 2024-04-22 DIAGNOSIS — M2550 Pain in unspecified joint: Secondary | ICD-10-CM | POA: Diagnosis not present

## 2024-04-22 DIAGNOSIS — R82998 Other abnormal findings in urine: Secondary | ICD-10-CM | POA: Diagnosis not present

## 2024-06-09 DIAGNOSIS — R310 Gross hematuria: Secondary | ICD-10-CM | POA: Diagnosis not present

## 2024-06-09 DIAGNOSIS — Z8546 Personal history of malignant neoplasm of prostate: Secondary | ICD-10-CM | POA: Diagnosis not present

## 2024-06-09 DIAGNOSIS — R319 Hematuria, unspecified: Secondary | ICD-10-CM | POA: Diagnosis not present

## 2024-06-25 DIAGNOSIS — R319 Hematuria, unspecified: Secondary | ICD-10-CM | POA: Diagnosis not present

## 2024-07-01 DIAGNOSIS — N281 Cyst of kidney, acquired: Secondary | ICD-10-CM | POA: Diagnosis not present

## 2024-07-01 DIAGNOSIS — N200 Calculus of kidney: Secondary | ICD-10-CM | POA: Diagnosis not present

## 2024-07-01 DIAGNOSIS — R319 Hematuria, unspecified: Secondary | ICD-10-CM | POA: Diagnosis not present

## 2024-08-14 DIAGNOSIS — R310 Gross hematuria: Secondary | ICD-10-CM | POA: Diagnosis not present

## 2024-08-14 DIAGNOSIS — N200 Calculus of kidney: Secondary | ICD-10-CM | POA: Diagnosis not present
